# Patient Record
Sex: Female | Born: 1990 | Race: White | Hispanic: No | Marital: Single | Smoking: Never smoker
Health system: Southern US, Community
[De-identification: ages and names within clinical notes are randomized; demographics above are authoritative.]

## PROBLEM LIST (undated history)

## (undated) DIAGNOSIS — K469 Unspecified abdominal hernia without obstruction or gangrene: Secondary | ICD-10-CM

## (undated) DIAGNOSIS — K589 Irritable bowel syndrome without diarrhea: Secondary | ICD-10-CM

## (undated) DIAGNOSIS — E109 Type 1 diabetes mellitus without complications: Secondary | ICD-10-CM

## (undated) DIAGNOSIS — K859 Acute pancreatitis without necrosis or infection, unspecified: Secondary | ICD-10-CM

## (undated) DIAGNOSIS — G43909 Migraine, unspecified, not intractable, without status migrainosus: Secondary | ICD-10-CM

## (undated) DIAGNOSIS — K219 Gastro-esophageal reflux disease without esophagitis: Secondary | ICD-10-CM

## (undated) DIAGNOSIS — E1110 Type 2 diabetes mellitus with ketoacidosis without coma: Secondary | ICD-10-CM

## (undated) DIAGNOSIS — R17 Unspecified jaundice: Secondary | ICD-10-CM

## (undated) HISTORY — DX: Unspecified jaundice: R17

## (undated) HISTORY — PX: HERNIA REPAIR: SHX51

## (undated) HISTORY — PX: PANCREATIC CYST DRAINAGE: SHX2156

## (undated) HISTORY — PX: BILE DUCT STENT PLACEMENT: SHX1227

---
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
## 2012-04-29 ENCOUNTER — Encounter (HOSPITAL_COMMUNITY): Payer: Self-pay | Admitting: *Deleted

## 2012-04-29 ENCOUNTER — Inpatient Hospital Stay (HOSPITAL_COMMUNITY)
Admission: EM | Admit: 2012-04-29 | Discharge: 2012-05-02 | DRG: 204 | Disposition: A | Discharge status: Home / Self Care | Payer: BC Managed Care – PPO | Attending: Internal Medicine | Admitting: Internal Medicine

## 2012-04-29 DIAGNOSIS — R000 Tachycardia, unspecified: Secondary | ICD-10-CM

## 2012-04-29 DIAGNOSIS — D75839 Thrombocytosis, unspecified: Secondary | ICD-10-CM

## 2012-04-29 DIAGNOSIS — E871 Hypo-osmolality and hyponatremia: Secondary | ICD-10-CM

## 2012-04-29 DIAGNOSIS — IMO0002 Reserved for concepts with insufficient information to code with codable children: Secondary | ICD-10-CM | POA: Diagnosis present

## 2012-04-29 DIAGNOSIS — Z91199 Patient's noncompliance with other medical treatment and regimen due to unspecified reason: Secondary | ICD-10-CM

## 2012-04-29 DIAGNOSIS — K859 Acute pancreatitis without necrosis or infection, unspecified: Principal | ICD-10-CM

## 2012-04-29 DIAGNOSIS — K219 Gastro-esophageal reflux disease without esophagitis: Secondary | ICD-10-CM

## 2012-04-29 DIAGNOSIS — E109 Type 1 diabetes mellitus without complications: Secondary | ICD-10-CM

## 2012-04-29 DIAGNOSIS — R112 Nausea with vomiting, unspecified: Secondary | ICD-10-CM

## 2012-04-29 DIAGNOSIS — E1110 Type 2 diabetes mellitus with ketoacidosis without coma: Secondary | ICD-10-CM

## 2012-04-29 DIAGNOSIS — E860 Dehydration: Secondary | ICD-10-CM

## 2012-04-29 DIAGNOSIS — Z882 Allergy status to sulfonamides status: Secondary | ICD-10-CM

## 2012-04-29 DIAGNOSIS — E1010 Type 1 diabetes mellitus with ketoacidosis without coma: Secondary | ICD-10-CM | POA: Diagnosis present

## 2012-04-29 DIAGNOSIS — E11649 Type 2 diabetes mellitus with hypoglycemia without coma: Secondary | ICD-10-CM

## 2012-04-29 DIAGNOSIS — D473 Essential (hemorrhagic) thrombocythemia: Secondary | ICD-10-CM

## 2012-04-29 DIAGNOSIS — Z79899 Other long term (current) drug therapy: Secondary | ICD-10-CM

## 2012-04-29 DIAGNOSIS — E876 Hypokalemia: Secondary | ICD-10-CM

## 2012-04-29 DIAGNOSIS — D649 Anemia, unspecified: Secondary | ICD-10-CM

## 2012-04-29 DIAGNOSIS — Z23 Encounter for immunization: Secondary | ICD-10-CM

## 2012-04-29 DIAGNOSIS — Z9119 Patient's noncompliance with other medical treatment and regimen: Secondary | ICD-10-CM

## 2012-04-29 DIAGNOSIS — R1110 Vomiting, unspecified: Secondary | ICD-10-CM

## 2012-04-29 DIAGNOSIS — E1069 Type 1 diabetes mellitus with other specified complication: Secondary | ICD-10-CM | POA: Diagnosis present

## 2012-04-29 DIAGNOSIS — K861 Other chronic pancreatitis: Secondary | ICD-10-CM | POA: Diagnosis present

## 2012-04-29 DIAGNOSIS — Z794 Long term (current) use of insulin: Secondary | ICD-10-CM

## 2012-04-29 DIAGNOSIS — R109 Unspecified abdominal pain: Secondary | ICD-10-CM

## 2012-04-29 HISTORY — DX: Gastro-esophageal reflux disease without esophagitis: K21.9

## 2012-04-29 LAB — BASIC METABOLIC PANEL
BUN: 34 mg/dL — ABNORMAL HIGH (ref 6–23)
Chloride: 109 mEq/L (ref 96–112)
Creatinine, Ser: 0.62 mg/dL (ref 0.50–1.10)
GFR calc Af Amer: >90 mL/min (ref >90)
GFR calc non Af Amer: >90 mL/min (ref >90)

## 2012-04-29 LAB — CBC WITH DIFFERENTIAL/PLATELET
Basophils Relative: 0 % (ref 0–1)
HCT: 43.5 % (ref 36.0–46.0)
Hemoglobin: 15.2 g/dL — ABNORMAL HIGH (ref 12.0–15.0)
Lymphs Abs: 1.2 10*3/uL (ref 0.7–4.0)
MCHC: 34.9 g/dL (ref 30.0–36.0)
Monocytes Absolute: 1.4 10*3/uL — ABNORMAL HIGH (ref 0.1–1.0)
Monocytes Relative: 9 % (ref 3–12)
Neutro Abs: 12.4 10*3/uL — ABNORMAL HIGH (ref 1.7–7.7)
Neutrophils Relative %: 82 % — ABNORMAL HIGH (ref 43–77)
RBC: 5.14 MIL/uL — ABNORMAL HIGH (ref 3.87–5.11)

## 2012-04-29 LAB — COMPREHENSIVE METABOLIC PANEL
Albumin: 4.6 g/dL (ref 3.5–5.2)
Alkaline Phosphatase: 122 U/L — ABNORMAL HIGH (ref 39–117)
BUN: 51 mg/dL — ABNORMAL HIGH (ref 6–23)
CO2: 18 mEq/L — ABNORMAL LOW (ref 19–32)
Chloride: 101 mEq/L (ref 96–112)
Creatinine, Ser: 0.99 mg/dL (ref 0.50–1.10)
GFR calc Af Amer: >90 mL/min (ref >90)
GFR calc non Af Amer: 82 mL/min — ABNORMAL LOW (ref >90)
Glucose, Bld: 254 mg/dL — ABNORMAL HIGH (ref 70–99)
Potassium: 4 mEq/L (ref 3.5–5.1)
Total Bilirubin: 0.2 mg/dL — ABNORMAL LOW (ref 0.3–1.2)

## 2012-04-29 LAB — URINALYSIS, ROUTINE W REFLEX MICROSCOPIC
Glucose, UA: >1000 mg/dL — AB
Ketones, ur: 40 mg/dL — AB
Leukocytes, UA: NEGATIVE
Nitrite: NEGATIVE
Protein, ur: 30 mg/dL — AB
Urobilinogen, UA: 0.2 mg/dL (ref 0.0–1.0)

## 2012-04-29 LAB — URINE MICROSCOPIC-ADD ON

## 2012-04-29 LAB — GLUCOSE, CAPILLARY: Glucose-Capillary: 278 mg/dL — ABNORMAL HIGH (ref 70–99)

## 2012-04-29 LAB — PREGNANCY, URINE: Preg Test, Ur: NEGATIVE

## 2012-04-29 MED ORDER — SODIUM CHLORIDE 0.9 % IV BOLUS (SEPSIS)
1000.0000 mL | Freq: Once | INTRAVENOUS | Status: AC
  Administered 2012-04-29: 1000 mL via INTRAVENOUS

## 2012-04-29 MED ORDER — HYDROMORPHONE HCL PF 1 MG/ML IJ SOLN
0.5000 mg | Freq: Once | INTRAMUSCULAR | Status: AC
  Administered 2012-04-29: 1 mg via INTRAVENOUS
  Filled 2012-04-29: qty 1

## 2012-04-29 MED ORDER — SODIUM CHLORIDE 0.9 % IV SOLN: INTRAVENOUS | Status: DC

## 2012-04-29 MED ORDER — PANTOPRAZOLE SODIUM 40 MG IV SOLR
40.0000 mg | INTRAVENOUS | Status: DC
  Administered 2012-04-30 – 2012-05-01 (×3): 40 mg via INTRAVENOUS
  Filled 2012-04-29 (×2): qty 40

## 2012-04-29 MED ORDER — ONDANSETRON HCL 4 MG/2ML IJ SOLN
4.0000 mg | Freq: Once | INTRAMUSCULAR | Status: AC
  Administered 2012-04-29: 4 mg via INTRAVENOUS
  Filled 2012-04-29: qty 2

## 2012-04-29 MED ORDER — HYDROMORPHONE HCL PF 1 MG/ML IJ SOLN
0.5000 mg | INTRAMUSCULAR | Status: DC | PRN
  Administered 2012-04-29 – 2012-04-30 (×6): 1 mg via INTRAVENOUS
  Filled 2012-04-29 (×6): qty 1

## 2012-04-29 MED ORDER — DEXTROSE-NACL 5-0.45 % IV SOLN
INTRAVENOUS | Status: DC
  Administered 2012-04-30: 150 mL via INTRAVENOUS
  Administered 2012-04-30 (×2): via INTRAVENOUS

## 2012-04-29 MED ORDER — POTASSIUM CHLORIDE 10 MEQ/100ML IV SOLN: 10.0000 meq | INTRAVENOUS | Status: AC

## 2012-04-29 MED ORDER — ENOXAPARIN SODIUM 40 MG/0.4ML ~~LOC~~ SOLN
40.0000 mg | SUBCUTANEOUS | Status: DC
  Administered 2012-04-30 – 2012-05-02 (×4): 40 mg via SUBCUTANEOUS
  Filled 2012-04-29 (×3): qty 0.4

## 2012-04-29 MED ORDER — DEXTROSE 50 % IV SOLN: 25.0000 mL | INTRAVENOUS | Status: DC | PRN

## 2012-04-29 MED ORDER — SODIUM CHLORIDE 0.9 % IV SOLN
INTRAVENOUS | Status: DC
  Administered 2012-04-30: 0.7 [IU]/h via INTRAVENOUS
  Filled 2012-04-29: qty 1

## 2012-04-29 NOTE — ED Provider Notes (Signed)
History   This chart was scribed for Linda Lennert, MD, by Frederik Pear. The patient was seen in room APA04/[facility]04 and the patient's care was started at 1735.    CSN: [id number]  Arrival date & time 04/29/12  1723   First MD Initiated Contact with Patient 04/29/12 1735      Chief Complaint  Patient presents with  . Emesis    (Consider location/radiation/quality/duration/timing/severity/associated sxs/prior treatment) HPI Comments: Linda Walls is a 21 y.o. female with a h/o of DM who presents to the Emergency Department complaining of moderate, constant abdominal pain with associated vomiting that began 3 days ago. She has a h/o of hereditary pancreatitis.     PCP is Dr. Gerda Diss.  Patient is a 21 y.o. female presenting with vomiting. The history is provided by a parent.  Emesis  This is a new problem. The current episode started more than 2 days ago. The problem has been gradually worsening. The emesis has an appearance of bilious material. There has been no fever. Associated symptoms include abdominal pain. Pertinent negatives include no cough, no diarrhea and no headaches. Risk factors: DM.    Past Medical History  Diagnosis Date  . Hereditary pancreatitis   . DM type 1 (diabetes mellitus, type 1)   . Hernia     Past Surgical History  Procedure Date  . Hernia repair     As infant.    No family history on file.  History  Substance Use Topics  . Smoking status: Never Smoker   . Smokeless tobacco: Never Used  . Alcohol Use: No    OB History    Grav Para Term Preterm Abortions TAB SAB Ect Mult Living                  Review of Systems  Constitutional: Negative for fatigue.  HENT: Negative for congestion, sinus pressure and ear discharge.   Eyes: Negative for discharge.  Respiratory: Negative for cough.   Cardiovascular: Negative for chest pain.  Gastrointestinal: Positive for vomiting and abdominal pain. Negative for diarrhea.  Genitourinary:  Negative for frequency and hematuria.  Musculoskeletal: Negative for back pain.  Skin: Negative for rash.  Neurological: Negative for seizures and headaches.  Hematological: Negative.   Psychiatric/Behavioral: Negative for hallucinations.    Allergies  Shellfish allergy and Sulfa antibiotics  Home Medications   Current Outpatient Rx  Name Route Sig Dispense Refill  . IBUPROFEN 200 MG PO TABS Oral Take 400 mg by mouth every 6 (six) hours as needed. pain    . INSULIN ASPART 100 UNIT/ML [place] SOLN Subcutaneous Inject 12 Units into the skin 3 (three) times daily before meals. *Increase subcutaneous injection by 1 unit for every 50 units over 150.*    . INSULIN GLARGINE 100 UNIT/ML [place] SOLN Subcutaneous Inject 45 Units into the skin every evening.     Carma Leaven M PLUS PO TABS Oral Take 1 tablet by mouth daily. For energy    . OMEPRAZOLE 20 MG PO CPDR Oral Take 20 mg by mouth daily as needed. For acid reflux    . OXYCODONE-ACETAMINOPHEN 7.5-325 MG PO TABS Oral Take 1 tablet by mouth every 6 (six) hours as needed. For pain    . PROMETHAZINE HCL 25 MG RE SUPP Rectal Place 1 suppository (25 mg total) rectally every 6 (six) hours as needed for nausea. 12 each 0  . PROMETHAZINE HCL 25 MG PO TABS Oral Take 25 mg by mouth daily as needed. Nausea,vomiting  BP 131/83  Pulse 118  Temp 97.6 F (36.4 C) (Oral)  Resp 16  Ht 5\' 2"  (1.575 m)  Wt 112 lb (50.803 kg)  BMI 20.49 kg/m2  SpO2 100%  LMP 02/27/2012  Physical Exam  Constitutional: She is oriented to person, place, and time. She appears well-developed. She appears lethargic.  HENT:  Head: Normocephalic and atraumatic.       Mucus membranes are dry.  Eyes: Conjunctivae normal and EOM are normal. No scleral icterus.  Neck: Neck supple. No thyromegaly present.  Cardiovascular: Normal rate and regular rhythm.  Exam reveals no gallop and no friction rub.   No murmur heard. Pulmonary/Chest: No stridor. She has no wheezes. She has no rales. She  exhibits no tenderness.  Abdominal: She exhibits no distension. There is tenderness. There is no rebound.       Mild tenderness throughout.  Musculoskeletal: Normal range of motion. She exhibits no edema.  Lymphadenopathy:    She has no cervical adenopathy.  Neurological: She is oriented to person, place, and time. She appears lethargic. Coordination normal.  Skin: No rash noted. No erythema.  Psychiatric: She has a normal mood and affect. Her behavior is normal.    ED Course  Procedures (including critical care time)  DIAGNOSTIC STUDIES: Oxygen Saturation is 98% on room air, normal by my interpretation.    COORDINATION OF CARE:  17:40- Discussed planned course of treatment with the patient, including a UA, who is agreeable at this time.  17:53- Medication Orders- sodium chloride 0.9% bolus 1,000 mL- Once, sodium chloride 0.9% bolus 1,000 ml- Once, ondansetron (ZOFRAN) injection 4 mg- Once  18:26- Medication Orders- sodium chloride 0.9% bolus 1,000 ml  18:45 Medication Orders- ondansetron (ZOFRAN) injection 4 mg, HYDROmorphone (DILAUDID) injection 0.5 mg  19:17- Medication Orders- sodium chloride 0.9% bolus 1,000 ml  20:01- Recheck- Discussed keeping the patient for overnight observation.  Labs Reviewed  CBC WITH DIFFERENTIAL - Abnormal; Notable for the following:    WBC 15.0 (*)     RBC 5.14 (*)     Hemoglobin 15.2 (*)     Platelets 414 (*)     Neutrophils Relative 82 (*)     Neutro Abs 12.4 (*)     Lymphocytes Relative 8 (*)     Monocytes Absolute 1.4 (*)     All other components within normal limits  COMPREHENSIVE METABOLIC PANEL - Abnormal; Notable for the following:    CO2 18 (*)     Glucose, Bld 254 (*)     BUN 51 (*)     Total Protein 8.9 (*)     Alkaline Phosphatase 122 (*)     Total Bilirubin 0.2 (*)     GFR calc non Af Amer 82 (*)     All other components within normal limits  URINALYSIS, ROUTINE W REFLEX MICROSCOPIC - Abnormal; Notable for the following:     Glucose, UA >1000 (*)     Hgb urine dipstick SMALL (*)     Bilirubin Urine MODERATE (*)     Ketones, ur 40 (*)     Protein, ur 30 (*)     All other components within normal limits  GLUCOSE, CAPILLARY - Abnormal; Notable for the following:    Glucose-Capillary 278 (*)     All other components within normal limits  PREGNANCY, URINE  URINE MICROSCOPIC-ADD ON  URINE CULTURE  BASIC METABOLIC PANEL   No results found.   No diagnosis found.  CRITICAL CARE Performed by: Giuseppe Duchemin L  Total critical care time: 40  Critical care time was exclusive of separately billable procedures and treating other patients.  Critical care was necessary to treat or prevent imminent or life-threatening deterioration.  Critical care was time spent personally by me on the following activities: development of treatment plan with patient and/or surrogate as well as nursing, discussions with consultants, evaluation of patient's response to treatment, examination of patient, obtaining history from patient or surrogate, ordering and performing treatments and interventions, ordering and review of laboratory studies, ordering and review of radiographic studies, pulse oximetry and re-evaluation of patient's condition.   MDM    The chart was scribed for me under my direct supervision.  I personally performed the history, physical, and medical decision making and all procedures in the evaluation of this patient.Linda Lennert, MD 04/29/12 2022

## 2012-04-29 NOTE — ED Notes (Signed)
Labs drawn, family at bedside, foley draining clear urine

## 2012-04-29 NOTE — ED Notes (Signed)
Pt has been vomiting x 2 days with abdominal pain. Denies diarrhea. Pt is a diabetic.

## 2012-04-29 NOTE — ED Notes (Signed)
Pt cbg was 135

## 2012-04-29 NOTE — H&P (Signed)
[facility] History and Physical  [name]  ZOX:[id number]  DOB: 1991-02-02   DOA: 04/29/2012   PCP:   [name], MD   Chief Complaint:  Vomiting and abdominal pain for 2 days  HPI: Linda Walls is an 21 y.o. female.   Young Caucasian lady with a history of diabetes type 1, gerd, and hereditary pancreatitis, presents with 2 days of nausea and vomiting more so yesterday, and today severe abdominal pain. Since yesterday she is been eating and drinking very little and has been looking very dehydrated, her blood sugars of been reading as "high"and her parent's were very attentive decided they better bring her to the emergency room today.  In the emergency room although her anion gap is normal, her BUN is markedly elevated, she looks extremely sick and puny, and after 3 L of IV fluids the hospitalist service was called to assist with management. Currently patient is complaining of severe abdominal pain, and father he is focused on comforting her.  Rewiew of Systems:   All systems negative except as marked bold or noted in the HPI;  Constitutional: Negative for malaise, fever and chills. ;  Eyes: Negative for eye pain, redness and discharge. ;  ENMT: Negative for ear pain, hoarseness, nasal congestion, sinus pressure and sore throat. ;  Cardiovascular: Negative for chest pain, palpitations, diaphoresis, dyspnea and peripheral edema. ;  Respiratory: Negative for cough, hemoptysis, wheezing and stridor. ;  Gastrointestinal: Negative for  diarrhea, constipation,  melena, blood in stool, hematemesis, jaundice and rectal bleeding. unusual weight loss..   Genitourinary: Negative for frequency, dysuria, incontinence,flank pain and hematuria; Musculoskeletal: Negative for back pain and neck pain. Negative for swelling and trauma.;  Skin: . Negative for pruritus, rash, abrasions, bruising and skin lesion.; ulcerations Neuro: Negative for headache, lightheadedness and neck stiffness.  Negative for weakness, altered level of consciousness , altered mental status, extremity weakness, burning feet, involuntary movement, seizure and syncope.  Psych: negative for anxiety, depression, insomnia, tearfulness, panic attacks, hallucinations, paranoia, suicidal or homicidal ideation   Past Medical History  Diagnosis Date  . Hereditary pancreatitis   . DM type 1 (diabetes mellitus, type 1)   . Hernia   . GERD (gastroesophageal reflux disease)     Past Surgical History  Procedure Date  . Hernia repair     As infant.    Medications:  HOME MEDS: Prior to Admission medications   Medication Sig Start Date End Date Taking? Authorizing Provider  ibuprofen (ADVIL,MOTRIN) 200 MG tablet Take 400 mg by mouth every 6 (six) hours as needed. pain   Yes Historical Provider, MD  insulin aspart (NOVOLOG FLEXPEN) 100 UNIT/ML injection Inject 12 Units into the skin 3 (three) times daily before meals. *Increase subcutaneous injection by 1 unit for every 50 units over 150.*   Yes Historical Provider, MD  insulin glargine (LANTUS) 100 UNIT/ML injection Inject 45 Units into the skin every evening.    Yes Historical Provider, MD  Multiple Vitamins-Minerals (MULTIVITAMINS THER. W/MINERALS) TABS Take 1 tablet by mouth daily. For energy   Yes Historical Provider, MD  omeprazole (PRILOSEC) 20 MG capsule Take 20 mg by mouth daily as needed. For acid reflux   Yes Historical Provider, MD  oxyCODONE-acetaminophen (PERCOCET) 7.5-325 MG per tablet Take 1 tablet by mouth every 6 (six) hours as needed. For pain   Yes Historical Provider, MD  promethazine (PHENERGAN) 25 MG suppository Place 1 suppository (25 mg total) rectally every 6 (six) hours as needed for nausea.  05/20/11 05/29/12 Yes Elliot Cousin, MD  promethazine (PHENERGAN) 25 MG tablet Take 25 mg by mouth daily as needed. Nausea,vomiting    Historical Provider, MD     Allergies:  Allergies  Allergen Reactions  . Shellfish Allergy Nausea And  Vomiting  . Sulfa Antibiotics Rash    Social History:   reports that she has never smoked. She has never used smokeless tobacco. She reports that she does not drink alcohol or use illicit drugs.  Family History: Family History  Problem Relation Age of Onset  . Pancreatitis Father 30    hereditary  . Hypertension Father   . GER disease Father      Physical Exam: Filed Vitals:   04/29/12 1727 04/29/12 1833  BP: 106/70 131/83  Pulse: 150 118  Temp: 97.6 F (36.4 C)   TempSrc: Oral   Resp: 16   Height: 5\' 2"  (1.575 m)   Weight: 50.803 kg (112 lb)   SpO2: 98% 100%   Blood pressure 131/83, pulse 118, temperature 97.6 F (36.4 C), temperature source Oral, resp. rate 16, height 5\' 2"  (1.575 m), weight 50.803 kg (112 lb), last menstrual period 02/27/2012, SpO2 100.00%.  GEN:  Pleasant but ill-looking young Caucasian lady lying in the stretcher in painful distress; cooperative with exam PSYCH:  alert and oriented x4; anxious; affect is appropriate. HEENT: Mucous membranes pink, dry, and anicteric; PERRLA; EOM intact; no cervical lymphadenopathy nor thyromegaly or carotid bruit; no JVD; Breasts:: Not examined CHEST WALL: No tenderness CHEST: Normal respiration, clear to auscultation bilaterally HEART: Tachycardic, regular rhythm; no murmurs rubs or gallops BACK: No kyphosis or scoliosis; no CVA tenderness ABDOMEN: Scaphoid, soft, diffuse mild tenderness ; no masses, no organomegaly, normal abdominal bowel sounds; no pannus; no intertriginous candida. Rectal Exam: Not done EXTREMITIES: No bone or joint deformity; ; no edema; no ulcerations. Genitalia: not examined PULSES: 2+ and symmetric SKIN: Normal hydration no rash or ulceration CNS: Cranial nerves 2-12 grossly intact no focal lateralizing neurologic deficit   Labs on Admission:  Basic Metabolic Panel:  Lab 04/29/12 1610  NA 135  K 4.0  CL 101  CO2 18*  GLUCOSE 254*  BUN 51*  CREATININE 0.99  CALCIUM 10.4  MG --    PHOS --   Liver Function Tests:  Lab 04/29/12 1823  AST 12  ALT 15  ALKPHOS 122*  BILITOT 0.2*  PROT 8.9*  ALBUMIN 4.6    Lab 04/29/12 2000  LIPASE 14  AMYLASE --   No results found for this basename: AMMONIA:5 in the last 168 hours CBC:  Lab 04/29/12 1823  WBC 15.0*  NEUTROABS 12.4*  HGB 15.2*  HCT 43.5  MCV 84.6  PLT 414*   Cardiac Enzymes: No results found for this basename: CKTOTAL:5,CKMB:5,CKMBINDEX:5,TROPONINI:5 in the last 168 hours BNP: No components found with this basename: POCBNP:5 D-dimer: No components found with this basename: D-DIMER:5 CBG:  Lab 04/29/12 2124 04/29/12 1804  GLUCAP 135* 278*    Radiological Exams on Admission: No results found.     Assessment/Plan Present on Admission:  . possible DKA (diabetic ketoacidoses) . severe Dehydration .Abdominal  pain, other specified site .GERD (gastroesophageal reflux disease)  PLAN: A sicker she looks we'll attempt to get an ABG to confirm acidosis; she does have ketones in her urine In the meantime we'll continue hydration and place on insulin drip; keep her n.p.o.  Counsel against NSAIDs and the presence of dehydration or with GERD. PPIs in case gastritis is the main source of her  pain  Since there is no clear cause for sudden deterioration in her records control, and by reports her hemoglobin A1c remains elevated, will consult her endocrinologist for assistance.  Other plans as per orders.  Code Status:FULL CODE  Family Communication: Mother and father at bedside for interview and examination Disposition Plan: Discharge home tomorrow if he becomes stable and pain controlled, and endocrinologist  adjusts her insulin  Javarri Segal Nocturnist [facility] Pager [phone]   04/29/2012, 10:02 PM

## 2012-04-30 ENCOUNTER — Encounter (HOSPITAL_COMMUNITY): Payer: Self-pay | Admitting: *Deleted

## 2012-04-30 DIAGNOSIS — R1110 Vomiting, unspecified: Secondary | ICD-10-CM

## 2012-04-30 LAB — BASIC METABOLIC PANEL
BUN: 21 mg/dL (ref 6–23)
CO2: 16 mEq/L — ABNORMAL LOW (ref 19–32)
CO2: 18 mEq/L — ABNORMAL LOW (ref 19–32)
Chloride: 108 mEq/L (ref 96–112)
Chloride: 110 mEq/L (ref 96–112)
Chloride: 111 mEq/L (ref 96–112)
GFR calc Af Amer: >90 mL/min (ref >90)
GFR calc non Af Amer: >90 mL/min (ref >90)
Glucose, Bld: 126 mg/dL — ABNORMAL HIGH (ref 70–99)
Glucose, Bld: 208 mg/dL — ABNORMAL HIGH (ref 70–99)
Potassium: 3.7 mEq/L (ref 3.5–5.1)
Potassium: 3.9 mEq/L (ref 3.5–5.1)
Potassium: 3.9 mEq/L (ref 3.5–5.1)
Sodium: 137 mEq/L (ref 135–145)
Sodium: 139 mEq/L (ref 135–145)
Sodium: 139 mEq/L (ref 135–145)

## 2012-04-30 LAB — GLUCOSE, CAPILLARY
Glucose-Capillary: 127 mg/dL — ABNORMAL HIGH (ref 70–99)
Glucose-Capillary: 133 mg/dL — ABNORMAL HIGH (ref 70–99)
Glucose-Capillary: 142 mg/dL — ABNORMAL HIGH (ref 70–99)
Glucose-Capillary: 156 mg/dL — ABNORMAL HIGH (ref 70–99)
Glucose-Capillary: 171 mg/dL — ABNORMAL HIGH (ref 70–99)
Glucose-Capillary: 173 mg/dL — ABNORMAL HIGH (ref 70–99)
Glucose-Capillary: 185 mg/dL — ABNORMAL HIGH (ref 70–99)
Glucose-Capillary: 185 mg/dL — ABNORMAL HIGH (ref 70–99)
Glucose-Capillary: 198 mg/dL — ABNORMAL HIGH (ref 70–99)
Glucose-Capillary: 260 mg/dL — ABNORMAL HIGH (ref 70–99)

## 2012-04-30 LAB — CBC
HCT: 34.3 % — ABNORMAL LOW (ref 36.0–46.0)
Hemoglobin: 11.9 g/dL — ABNORMAL LOW (ref 12.0–15.0)
MCH: 29.5 pg (ref 26.0–34.0)
MCHC: 34.7 g/dL (ref 30.0–36.0)
MCV: 84.9 fL (ref 78.0–100.0)
RDW: 12.9 % (ref 11.5–15.5)

## 2012-04-30 LAB — HEMOGLOBIN A1C: Mean Plasma Glucose: 315 mg/dL — ABNORMAL HIGH (ref <117)

## 2012-04-30 MED ORDER — INSULIN REGULAR HUMAN 100 UNIT/ML IJ SOLN
INTRAMUSCULAR | Status: AC
  Filled 2012-04-30: qty 3

## 2012-04-30 MED ORDER — POTASSIUM CHLORIDE 10 MEQ/100ML IV SOLN
INTRAVENOUS | Status: AC
  Filled 2012-04-30: qty 200

## 2012-04-30 MED ORDER — INFLUENZA VIRUS VACC SPLIT PF IM SUSP
0.5000 mL | INTRAMUSCULAR | Status: AC
  Administered 2012-05-01: 0.5 mL via INTRAMUSCULAR
  Filled 2012-04-30: qty 0.5

## 2012-04-30 MED ORDER — POTASSIUM CHLORIDE 10 MEQ/100ML IV SOLN
10.0000 meq | INTRAVENOUS | Status: AC
  Administered 2012-04-30 (×2): 10 meq via INTRAVENOUS

## 2012-04-30 MED ORDER — ONDANSETRON HCL 4 MG/2ML IJ SOLN
4.0000 mg | INTRAMUSCULAR | Status: DC | PRN
  Administered 2012-04-30 – 2012-05-02 (×8): 4 mg via INTRAVENOUS
  Filled 2012-04-30 (×6): qty 2

## 2012-04-30 MED ORDER — BACITRACIN 500 UNIT/GM EX OINT
TOPICAL_OINTMENT | Freq: Every day | CUTANEOUS | Status: DC
  Administered 2012-05-01 (×2): 1 via TOPICAL
  Filled 2012-04-30: qty 15

## 2012-04-30 MED ORDER — OXYCODONE-ACETAMINOPHEN 5-325 MG PO TABS
2.0000 | ORAL_TABLET | ORAL | Status: DC | PRN
Start: 2012-04-30 — End: 2012-05-02
  Administered 2012-04-30 – 2012-05-01 (×4): 2 via ORAL
  Filled 2012-04-30 (×4): qty 2

## 2012-04-30 MED ORDER — SODIUM CHLORIDE 0.9 % IJ SOLN
INTRAMUSCULAR | Status: AC
  Filled 2012-04-30: qty 3

## 2012-04-30 NOTE — Progress Notes (Signed)
Nutrition Brief Note  Patient identified on the Malnutrition Screening Tool (MST) Report  Body mass index is 20.48 kg/(m^2). Pt meets criteria for Normal Range based on current BMI.   Current diet order is Clear Liquids , patient is feeling hungry diet just advanced. No po intake recorded as yet. Labs and medications reviewed. She and parents declined diet education review.  No nutrition interventions warranted at this time. If nutrition issues arise, please consult RD.   #[phone]

## 2012-04-30 NOTE — Progress Notes (Signed)
Notified Dr. Fransico Him of the insulin drip requiring me to turn off for the last two readings/hours.  Due to the fact that 0 could not be programmed into the alaris pump guardrails.  i voiced to him that her CBG's have continued to be checked Q1 and she has been in target range for the last 4 readings.  Voiced to him what those readings were.  Voiced to him that Dr. Kerry Hough had ordered clear liquids but she had not had anything po because she was waiting on him to re-evaluate.  He verbalized understanding and stated she could have clear liquids if she had positive bowel sounds.

## 2012-04-30 NOTE — Progress Notes (Signed)
UR Chart Review Completed  

## 2012-04-30 NOTE — Consult Note (Signed)
Linda Walls, Linda Walls             ACCOUNT NO.:  [id number]  MEDICAL RECORD NO.:  [id number]  LOCATION:  IC10                          FACILITY:  [facility]  PHYSICIAN:  Purcell Nails, MD DATE OF BIRTH:  1991-04-02  DATE OF CONSULTATION:  04/30/2012 DATE OF DISCHARGE:                                CONSULTATION   REASON FOR CONSULT:  Diabetic ketoacidosis.  HISTORY OF PRESENT ILLNESS:  This is a 21 year old female with medical history as follows.  She is familiar to me from outpatient consult before August, 2013.  She has a history of  hereditary pancreatitis which triggered type 1 diabetes at age 73.  She had uncontrolled course for several years.  She saw me first in June of 2013 with a hemoglobin A1c near 10%, at which time, she was put on basal bolus insulin using Lantus and NovoLog.  Her A1c has improved from 11.6-10% at January 30, 2012 test.  She comes into the hospital with a complaint of vomiting and abdominal pain for 2 days, and her meter rate blood glucose is high. She was found to have near DKA profile with a bicarb of 15 at presentation.  Hence she was subsequently admitted for stabilization. She is on insulin drip at this point, doing much better, but did not tolerate any oral feeding yet.  She still has abdominal pain;  however, no vomiting at this point.  She is on dextrose 5 in normal saline at 150 mL/h with good urine output.  PAST MEDICAL HISTORY:  Type 1 diabetes, hereditary pancreatitis.  PAST SURGICAL HISTORY:  Hernia repair and central line placement in the past.  HOME MEDICATIONS: 1. NovoLog 12 units t.i.d. before meals. 2. Lantus 45 units at bedtime.  REVIEW OF SYSTEMS:  The patient did not have any headaches or bleeding. She did not have any chest pain.  No cough.  No fever.  She has had abdominal pain, nausea, and vomiting, but no diarrhea.  She denied any muscle pain or back pain.  The rest of the systems have been reviewed and  negative.  ALLERGIES:  Shellfish and sulfa antibiotics.  SOCIAL HISTORY:  Negative for smoking, alcohol, or drug use.  FAMILY HISTORY:  Significant for pancreatitis in her father at age 83, hypertension, and GERD.  PHYSICAL EXAMINATION:  GENERAL:  She is sick looking, not in acute distress. VITAL SIGNS:  Blood pressure is 118/81, pulse rate 91, temperature 98.6. HEENT:  Slightly dry mucous membranes. NECK:  Negative for JVD or thyromegaly. CHEST:  Clear to auscultation bilaterally. CARDIOVASCULAR:  Normal S1, S2.  No murmur.  No gallop.  ABDOMEN: Tender with guarding on lower quadrant.  Bowel sounds are hypoactive at this point. EXTREMITIES:  No edema. CNS:  Nonfocal.  LABORATORY DATA:  Her most recent labs show sodium 137, potassium 3.9, chloride 108, bicarb 19, BUN 21, creatinine 0.53, calcium 9.0. Hemoglobin A1c 10.4% on January 30, 2012.  ASSESSMENT: 1. Diabetic ketoacidosis, resolving. 2. Type 1 diabetes chronically uncontrolled with recent improvement on     basal bolus insulin.  PLAN:  Until the patient tolerates oral feeding and until anion gap closes it is better to keep her on the drip for at least  until the end of the day.  She can start ice chips; if she tolerates clear liquids will be advanced up to 1800 ADA calorie diet,  at which time, we will consider switching her to basal bolus insulin.  Continue to care for the patient in ICU.  I will also continue to follow her.          ______________________________ Purcell Nails, MD     GN/MEDQ  D:  04/30/2012  T:  04/30/2012  Job:  [id number]

## 2012-04-30 NOTE — Consult Note (Signed)
  [id number] 

## 2012-04-30 NOTE — Progress Notes (Addendum)
Contacted MD to request IV pain medicine be changed to PO. Patient takes percocet at home for pancreatic pain. IV pain medication making patient very groggy.

## 2012-04-30 NOTE — Progress Notes (Signed)
[facility]             Progress Note   Subjective: Feeling better today.  Still has some mild abd pain, but nausea and vomiting are better.  She feels hungry. Currently still on insulin infusion.  Objective: Vital signs in last 24 hours: Temp:  [97.6 F (36.4 C)-99 F (37.2 C)] 98.6 F (37 C) (11/01 0800) Pulse Rate:  [90-150] 102  (11/01 0800) Resp:  [9-20] 17  (11/01 0800) BP: (106-131)/(59-94) 118/83 mmHg (11/01 0800) SpO2:  [96 %-100 %] 100 % (11/01 0800) Weight:  [49 kg (108 lb 0.4 oz)-50.803 kg (112 lb)] 50.8 kg (111 lb 15.9 oz) (11/01 0500) Weight change:  Last BM Date: 04/29/12  Intake/Output from previous day: 10/31 0701 - 11/01 0700 In: 1139.8 [I.V.:939.8; IV Piggyback:200] Out: 1000 [Urine:1000]     Physical Exam: General: Alert, awake, oriented x3, in no acute distress. HEENT: No bruits, no goiter. Heart: s1, s2, tachycardic Lungs: Clear to auscultation bilaterally. Abdomen: Soft, nontender, nondistended, positive bowel sounds. Extremities: No clubbing cyanosis or edema with positive pedal pulses. Neuro: Grossly intact, nonfocal.    Lab Results: Basic Metabolic Panel:  Basename 04/30/12 0824 04/30/12 0424  NA 137 139  K 3.9 3.7  CL 108 111  CO2 19 18*  GLUCOSE 208* 126*  BUN 21 26*  CREATININE 0.53 0.57  CALCIUM 9.0 9.1  MG -- --  PHOS -- --   Liver Function Tests:  [facility] 04/29/12 1823  AST 12  ALT 15  ALKPHOS 122*  BILITOT 0.2*  PROT 8.9*  ALBUMIN 4.6    Basename 04/29/12 2000  LIPASE 14  AMYLASE --   No results found for this basename: AMMONIA:2 in the last 72 hours CBC:  Basename 04/30/12 0424 04/29/12 1823  WBC 8.5 15.0*  NEUTROABS -- 12.4*  HGB 11.9* 15.2*  HCT 34.3* 43.5  MCV 84.9 84.6  PLT 295 414*   Cardiac Enzymes: No results found for this basename: CKTOTAL:3,CKMB:3,CKMBINDEX:3,TROPONINI:3 in the last 72 hours BNP: No results found for this basename: PROBNP:3 in the last 72 hours D-Dimer: No  results found for this basename: DDIMER:2 in the last 72 hours CBG:  Basename 04/30/12 0830 04/30/12 0721 04/30/12 0628 04/30/12 0540 04/30/12 0433 04/30/12 0322  GLUCAP 198* 156* 142* 120* 119* 173*   Hemoglobin A1C: No results found for this basename: HGBA1C in the last 72 hours Fasting Lipid Panel: No results found for this basename: CHOL,HDL,LDLCALC,TRIG,CHOLHDL,LDLDIRECT in the last 72 hours Thyroid Function Tests: No results found for this basename: TSH,T4TOTAL,FREET4,T3FREE,THYROIDAB in the last 72 hours Anemia Panel: No results found for this basename: VITAMINB12,FOLATE,FERRITIN,TIBC,IRON,RETICCTPCT in the last 72 hours Coagulation: No results found for this basename: LABPROT:2,INR:2 in the last 72 hours Urine Drug Screen: Drugs of Abuse  No results found for this basename: labopia, cocainscrnur, labbenz, amphetmu, thcu, labbarb    Alcohol Level: No results found for this basename: ETH:2 in the last 72 hours Urinalysis:  Basename 04/29/12 1820  COLORURINE YELLOW  LABSPEC 1.020  PHURINE 6.0  GLUCOSEU >1000*  HGBUR SMALL*  BILIRUBINUR MODERATE*  KETONESUR 40*  PROTEINUR 30*  UROBILINOGEN 0.2  NITRITE NEGATIVE  LEUKOCYTESUR NEGATIVE    Recent Results (from the past 240 hour(s))  MRSA PCR SCREENING     Status: Normal   Collection Time   04/30/12  1:18 AM      Component Value Range Status Comment   MRSA by PCR NEGATIVE  NEGATIVE Final     Studies/Results: No results found.  Medications: Scheduled Meds:   . enoxaparin  40 mg Subcutaneous Q24H  . HYDROmorphone  0.5 mg Intravenous Once  . influenza  inactive virus vaccine  0.5 mL Intramuscular Tomorrow-1000  . ondansetron  4 mg Intravenous Once  . ondansetron  4 mg Intravenous Once  . pantoprazole (PROTONIX) IV  40 mg Intravenous Q24H  . potassium chloride  10 mEq Intravenous Q1H  . potassium chloride  10 mEq Intravenous Q1 Hr x 2  . potassium chloride      . sodium chloride  1,000 mL Intravenous Once  .  sodium chloride  1,000 mL Intravenous Once  . sodium chloride  1,000 mL Intravenous Once   Continuous Infusions:   . sodium chloride    . dextrose 5 % and 0.45% NaCl 150 mL/hr at 04/30/12 0616  . insulin (NOVOLIN-R) infusion 2.8 Units/hr (04/30/12 0835)   PRN Meds:.dextrose, HYDROmorphone (DILAUDID) injection, ondansetron (ZOFRAN) IV  Assessment/Plan:  Principal Problem:  *DKA (diabetic ketoacidoses) Active Problems:  Dehydration  Abdominal  pain, other specified site  GERD (gastroesophageal reflux disease)  1. DKA, in type 1 DM. Patient reports compliance with medications/diet.  She may have had a viral gastroenteritis that precipitated her dehydration and DKA.  She has been aggressively hydrated with IV fluids.  She has been maintained on insulin infusion and her Anion gap has closed. Blood sugars are in goal range.  She has been seen by her endocrinologist, Dr. Fransico Him.  Recommendations were to continue the insulin infusion until this afternoon, when further adjustments in her insulin regimen can be made. Will start patient on liquids and advance to low carb diet as tolerated. Once patient is tolerating solid foods and blood sugars remain reasonably controlled, we will plan on discharge home.  This will likely occur in the next 24 hours.  Transfer to medsurg bed once she is off insulin infusion.  Time spent coordinating care:   LOS: 1 day   MEMON,JEHANZEB [facility] Pager: [phone] 04/30/2012, 9:25 AM

## 2012-04-30 NOTE — Plan of Care (Signed)
Problem: Consults Goal: Diabetic Ketoacidosis (DKA) Patient Education See Patient Education Modules for education specifics. Type I diabetic, hereditary pancreatitis Goal: Diabetes Guidelines if Diabetic/Glucose > 140 If diabetic or lab glucose is > 140 mg/dl - Initiate Diabetes/Hyperglycemia Guidelines & Document Interventions  Outcome: Progressing Glucostabilizer in progress  Problem: Phase I Progression Outcomes Goal: Monitor hydration status Outcome: Progressing 4 Liters given in the ED Goal: Acidosis resolving Outcome: Completed/Met Date Met:  04/30/12 Anion Gap 10 at 0424 Goal: K+ level approaching normal with therapy Outcome: Progressing 2 runs of potassium given Goal: Nausea/vomiting controlled with antiemetics Outcome: Progressing Treating with antiemetics Goal: Pain controlled with appropriate interventions Outcome: Progressing Abdominal pain is a chronic issue, patient stating pain level at an 8, can live with a 7 Goal: Initial discharge plan identified Outcome: Progressing Home with parents

## 2012-04-30 NOTE — ED Notes (Signed)
Spoke with Dr. Orvan Falconer who instructed to start pt on insulin gtt and admit to [facility].

## 2012-05-01 DIAGNOSIS — R112 Nausea with vomiting, unspecified: Secondary | ICD-10-CM | POA: Diagnosis present

## 2012-05-01 DIAGNOSIS — E11649 Type 2 diabetes mellitus with hypoglycemia without coma: Secondary | ICD-10-CM | POA: Diagnosis not present

## 2012-05-01 DIAGNOSIS — E876 Hypokalemia: Secondary | ICD-10-CM

## 2012-05-01 DIAGNOSIS — E1169 Type 2 diabetes mellitus with other specified complication: Secondary | ICD-10-CM

## 2012-05-01 LAB — URINE CULTURE

## 2012-05-01 LAB — BASIC METABOLIC PANEL
GFR calc Af Amer: >90 mL/min (ref >90)
GFR calc non Af Amer: >90 mL/min (ref >90)
Potassium: 2.6 mEq/L — CL (ref 3.5–5.1)
Sodium: 138 mEq/L (ref 135–145)

## 2012-05-01 LAB — GLUCOSE, CAPILLARY
Glucose-Capillary: 117 mg/dL — ABNORMAL HIGH (ref 70–99)
Glucose-Capillary: 130 mg/dL — ABNORMAL HIGH (ref 70–99)
Glucose-Capillary: 180 mg/dL — ABNORMAL HIGH (ref 70–99)
Glucose-Capillary: 191 mg/dL — ABNORMAL HIGH (ref 70–99)
Glucose-Capillary: 192 mg/dL — ABNORMAL HIGH (ref 70–99)
Glucose-Capillary: 48 mg/dL — ABNORMAL LOW (ref 70–99)
Glucose-Capillary: 58 mg/dL — ABNORMAL LOW (ref 70–99)

## 2012-05-01 MED ORDER — KCL IN DEXTROSE-NACL 40-5-0.9 MEQ/L-%-% IV SOLN
INTRAVENOUS | Status: AC
  Filled 2012-05-01: qty 1000

## 2012-05-01 MED ORDER — POTASSIUM CHLORIDE IN NACL 40-0.9 MEQ/L-% IV SOLN: INTRAVENOUS | Status: DC

## 2012-05-01 MED ORDER — INSULIN GLARGINE 100 UNIT/ML ~~LOC~~ SOLN: 10.0000 [IU] | Freq: Every day | SUBCUTANEOUS | Status: DC

## 2012-05-01 MED ORDER — SODIUM CHLORIDE 0.9 % IJ SOLN
INTRAMUSCULAR | Status: AC
  Filled 2012-05-01: qty 3

## 2012-05-01 MED ORDER — INSULIN ASPART 100 UNIT/ML ~~LOC~~ SOLN
0.0000 [IU] | Freq: Three times a day (TID) | SUBCUTANEOUS | Status: DC
  Administered 2012-05-02: 2 [IU] via SUBCUTANEOUS

## 2012-05-01 MED ORDER — ONDANSETRON HCL 4 MG/2ML IJ SOLN
INTRAMUSCULAR | Status: AC
  Administered 2012-05-01: 4 mg via INTRAVENOUS
  Filled 2012-05-01: qty 2

## 2012-05-01 MED ORDER — BENZONATATE 100 MG PO CAPS
100.0000 mg | ORAL_CAPSULE | Freq: Three times a day (TID) | ORAL | Status: DC | PRN
  Administered 2012-05-01: 100 mg via ORAL
  Filled 2012-05-01: qty 1

## 2012-05-01 MED ORDER — INSULIN ASPART 100 UNIT/ML ~~LOC~~ SOLN
5.0000 [IU] | Freq: Three times a day (TID) | SUBCUTANEOUS | Status: DC
  Administered 2012-05-01: 5 [IU] via SUBCUTANEOUS

## 2012-05-01 MED ORDER — PANTOPRAZOLE SODIUM 40 MG IV SOLR
INTRAVENOUS | Status: AC
  Administered 2012-05-01: 21:00:00
  Filled 2012-05-01: qty 40

## 2012-05-01 MED ORDER — INSULIN ASPART 100 UNIT/ML ~~LOC~~ SOLN: 0.0000 [IU] | Freq: Every day | SUBCUTANEOUS | Status: DC

## 2012-05-01 MED ORDER — INSULIN ASPART 100 UNIT/ML ~~LOC~~ SOLN
0.0000 [IU] | Freq: Three times a day (TID) | SUBCUTANEOUS | Status: DC
  Administered 2012-05-01: 3 [IU] via SUBCUTANEOUS
  Administered 2012-05-01: 4 [IU] via SUBCUTANEOUS

## 2012-05-01 MED ORDER — KCL IN DEXTROSE-NACL 40-5-0.9 MEQ/L-%-% IV SOLN
INTRAVENOUS | Status: DC
  Administered 2012-05-01: 18:00:00 via INTRAVENOUS
  Filled 2012-05-01 (×4): qty 1000

## 2012-05-01 MED ORDER — HYDROMORPHONE HCL PF 1 MG/ML IJ SOLN
INTRAMUSCULAR | Status: AC
  Filled 2012-05-01: qty 1

## 2012-05-01 MED ORDER — SODIUM CHLORIDE 0.9 % IJ SOLN
INTRAMUSCULAR | Status: AC
  Administered 2012-05-01: 3 mL
  Filled 2012-05-01: qty 3

## 2012-05-01 MED ORDER — POTASSIUM CHLORIDE CRYS ER 20 MEQ PO TBCR
40.0000 meq | EXTENDED_RELEASE_TABLET | Freq: Once | ORAL | Status: AC
  Administered 2012-05-01: 40 meq via ORAL
  Filled 2012-05-01: qty 2

## 2012-05-01 MED ORDER — POTASSIUM CHLORIDE 10 MEQ/100ML IV SOLN
10.0000 meq | INTRAVENOUS | Status: DC
  Filled 2012-05-01: qty 400
  Filled 2012-05-01: qty 100

## 2012-05-01 MED ORDER — HYDROMORPHONE HCL PF 1 MG/ML IJ SOLN
1.0000 mg | INTRAMUSCULAR | Status: DC | PRN
  Administered 2012-05-01 – 2012-05-02 (×4): 1 mg via INTRAVENOUS
  Filled 2012-05-01 (×3): qty 1

## 2012-05-01 MED ORDER — POTASSIUM CHLORIDE CRYS ER 20 MEQ PO TBCR
EXTENDED_RELEASE_TABLET | ORAL | Status: AC
  Filled 2012-05-01: qty 1

## 2012-05-01 MED ORDER — POTASSIUM CHLORIDE CRYS ER 20 MEQ PO TBCR
EXTENDED_RELEASE_TABLET | ORAL | Status: AC
  Administered 2012-05-01: 17:00:00
  Filled 2012-05-01: qty 1

## 2012-05-01 MED ORDER — GUAIFENESIN-DM 100-10 MG/5ML PO SYRP: 5.0000 mL | ORAL_SOLUTION | ORAL | Status: DC | PRN

## 2012-05-01 MED ORDER — ONDANSETRON HCL 4 MG/2ML IJ SOLN
INTRAMUSCULAR | Status: AC
  Administered 2012-05-02: 4 mg
  Filled 2012-05-01: qty 2

## 2012-05-01 MED ORDER — ONDANSETRON 4 MG PO TBDP
4.0000 mg | ORAL_TABLET | Freq: Four times a day (QID) | ORAL | Status: DC | PRN
  Filled 2012-05-01: qty 1

## 2012-05-01 MED ORDER — INSULIN GLARGINE 100 UNIT/ML ~~LOC~~ SOLN
22.0000 [IU] | Freq: Every day | SUBCUTANEOUS | Status: DC
  Administered 2012-05-01: 22 [IU] via SUBCUTANEOUS

## 2012-05-01 MED ORDER — POTASSIUM CHLORIDE IN NACL 40-0.9 MEQ/L-% IV SOLN
INTRAVENOUS | Status: DC
  Administered 2012-05-01: 08:00:00 via INTRAVENOUS

## 2012-05-01 MED ORDER — POTASSIUM CHLORIDE CRYS ER 20 MEQ PO TBCR: 40.0000 meq | EXTENDED_RELEASE_TABLET | Freq: Three times a day (TID) | ORAL | Status: DC

## 2012-05-01 NOTE — Progress Notes (Signed)
NAME:  Linda Walls, Linda Walls             ACCOUNT NO.:  [id number]  MEDICAL RECORD NO.:  [id number]  LOCATION:  IC10                          FACILITY:  [facility]  PHYSICIAN:  Edmund Rick, MD DATE OF BIRTH:  04/21/1991  DATE OF PROCEDURE: DATE OF DISCHARGE:                                PROGRESS NOTE   REASON FOR FOLLOWUP:  Uncontrolled type 1 diabetes, with DKA.  SUBJECTIVE:  The patient feels better.  She is tolerating oral feeding this morning.  No hypoglycemia.  Remains on insulin drip until midnight at which time, she was switched over to basal bolus insulin.  OBJECTIVE:  GENERAL:  She looks good.  No acute distress. VITAL SIGNS:  Blood pressure 105/73, pulse rate 100, temperature 99.1. HEENT:  Well hydrated. NECK:  Negative for JVD. CHEST:  Clear to auscultation bilaterally.  CARDIOVASCULAR:  Normal S1 and S2.  No murmur.  No gallop. ABDOMEN:  Much better.  Very little tenderness this morning.  Bowel sounds are positive. EXTREMITIES:  No edema. CNS:  Nonfocal.  LABORATORY DATA:  Hypokalemia 2.6.  She is on potassium supplement. Sodium 138, BUN 6, and creatinine 0.4.  ASSESSMENT: 1. Diabetic ketoacidosis, resolved. 2. Type 1 diabetes, chronically uncontrolled.  Recent A1c was 10.4% on     January 30, 2012.  PLAN:  Agree with conversion to basal bolus insulin since she is not eating the same as at home.  Continue Lantus 22 units at bedtime, however, she will need prandial insulin NovoLog 5 units t.i.d. a.c. plus sliding scale only at this time.  Continue to monitor blood glucose before meals and at bedtime and advance her diet to 1800 calorie ADA diet.  Carbohydrate restricted.  I will continue followup.          ______________________________ Purcell Nails, MD    GN/MEDQ  D:  05/01/2012  T:  05/01/2012  Job:  [id number]

## 2012-05-01 NOTE — Progress Notes (Signed)
Meal time insulin held d/t cbgx2<60. Then up to 115 1hr after tx.

## 2012-05-01 NOTE — Progress Notes (Signed)
Called to patients room to check Iv for the third time.  Patient verbalized iv felt tender.  Explained to patient that she was receiving potassium through IV and that may be why she felt it was tender.  Iv flushes without difficulty and blood return noted.  Iv stopped and disconnected.  Informed patient that md would be notified.  Dr. Sherrie Mustache called and gave orders to stop IV fluids and not to start a new IV due to patient possibly going home.  Patient has medication for nausea and pain via po route.  Returning to the room to discontinue Iv and explain a new IV would not be placed. The patient stated that the iv area felt fine and she wanted to leave the iv in for now.  IV fluids remain off at this time.

## 2012-05-01 NOTE — Progress Notes (Signed)
PT TRANSFERRED TO ROOM 306. IV IN LT FOREARM PATENT. SKIN WARM AND DRY. ALERT AND ORIENTED. NO FURTHER C/O PAIN OR NAUSEA. PARENTS AT BEDSIDE ABDOMEN TENDER. PT IS NOW NPO . REPORT CALLED TO RN ON 300.

## 2012-05-01 NOTE — Progress Notes (Signed)
Subjective: The patient was initially seen this morning. She had tolerated her full liquid diet. The plan was to discharge her this afternoon if she was able to keep solid foods down. Apparently, this afternoon, she reports nausea and vomiting, but this has not been confirmed by the nursing staff.  Objective: Vital signs in last 24 hours: Filed Vitals:   05/01/12 1300 05/01/12 1400 05/01/12 1500 05/01/12 1600  BP:      Pulse: 74 94 99   Temp:    98.6 F (37 C)  TempSrc:    Oral  Resp: 17 15 15    Height:      Weight:      SpO2: 92% 99% 100%     Intake/Output Summary (Last 24 hours) at 05/01/12 1741 Last data filed at 05/01/12 1247  Gross per 24 hour  Intake 3388.11 ml  Output      0 ml  Net 3388.11 ml    Weight change: 2.597 kg (5 lb 11.6 oz)  Physical exam: Lungs: Clear to auscultation bilaterally. Heart: S1, S2, with no murmurs rubs or gallops. Abdomen: Positive bowel sounds, very minimal tenderness in the epigastrium, no distention, no rebound, no guarding. Extremities: No pedal edema.  Lab Results: Basic Metabolic Panel:  Basename 05/01/12 0445 04/30/12 0824  NA 138 137  K 2.6* 3.9  CL 108 108  CO2 22 19  GLUCOSE 162* 208*  BUN 6 21  CREATININE 0.40* 0.53  CALCIUM 8.7 9.0  MG -- --  PHOS -- --   Liver Function Tests:  [facility] 04/29/12 1823  AST 12  ALT 15  ALKPHOS 122*  BILITOT 0.2*  PROT 8.9*  ALBUMIN 4.6    Basename 04/29/12 2000  LIPASE 14  AMYLASE --   No results found for this basename: AMMONIA:2 in the last 72 hours CBC:  Basename 04/30/12 0424 04/29/12 1823  WBC 8.5 15.0*  NEUTROABS -- 12.4*  HGB 11.9* 15.2*  HCT 34.3* 43.5  MCV 84.9 84.6  PLT 295 414*   Cardiac Enzymes: No results found for this basename: CKTOTAL:3,CKMB:3,CKMBINDEX:3,TROPONINI:3 in the last 72 hours BNP: No results found for this basename: PROBNP:3 in the last 72 hours D-Dimer: No results found for this basename: DDIMER:2 in the last 72  hours CBG:  Basename 05/01/12 1738 05/01/12 1644 05/01/12 1614 05/01/12 1123 05/01/12 0744 05/01/12 0633  GLUCAP 115* 58* 48* 128* 192* 191*   Hemoglobin A1C:  Basename 04/30/12 0112  HGBA1C 12.6*   Fasting Lipid Panel: No results found for this basename: CHOL,HDL,LDLCALC,TRIG,CHOLHDL,LDLDIRECT in the last 72 hours Thyroid Function Tests:  Basename 04/30/12 0112  TSH 2.603  T4TOTAL --  FREET4 --  T3FREE --  THYROIDAB --   Anemia Panel: No results found for this basename: VITAMINB12,FOLATE,FERRITIN,TIBC,IRON,RETICCTPCT in the last 72 hours Coagulation: No results found for this basename: LABPROT:2,INR:2 in the last 72 hours Urine Drug Screen: Drugs of Abuse  No results found for this basename: labopia,  cocainscrnur,  labbenz,  amphetmu,  thcu,  labbarb    Alcohol Level: No results found for this basename: ETH:2 in the last 72 hours Urinalysis:  Basename 04/29/12 1820  COLORURINE YELLOW  LABSPEC 1.020  PHURINE 6.0  GLUCOSEU >1000*  HGBUR SMALL*  BILIRUBINUR MODERATE*  KETONESUR 40*  PROTEINUR 30*  UROBILINOGEN 0.2  NITRITE NEGATIVE  LEUKOCYTESUR NEGATIVE   Misc. Labs:   Micro: Recent Results (from the past 240 hour(s))  URINE CULTURE     Status: Normal   Collection Time   04/29/12  6:20  PM      Component Value Range Status Comment   Specimen Description URINE, CATHETERIZED   Final    Special Requests NONE   Final    Culture  Setup Time 04/30/2012 01:42   Final    Colony Count NO GROWTH   Final    Culture NO GROWTH   Final    Report Status 05/01/2012 FINAL   Final   MRSA PCR SCREENING     Status: Normal   Collection Time   04/30/12  1:18 AM      Component Value Range Status Comment   MRSA by PCR NEGATIVE  NEGATIVE Final     Studies/Results: No results found.  Medications:  Scheduled:   . bacitracin   Topical Daily  . enoxaparin  40 mg Subcutaneous Q24H  . influenza  inactive virus vaccine  0.5 mL Intramuscular Tomorrow-1000  . insulin aspart   0-5 Units Subcutaneous QHS  . insulin aspart  0-9 Units Subcutaneous TID WC  . insulin glargine  10 Units Subcutaneous Daily  . pantoprazole (PROTONIX) IV  40 mg Intravenous Q24H  . potassium chloride SA      . potassium chloride  40 mEq Oral Once  . sodium chloride      . sodium chloride      . sodium chloride      . sodium chloride      . DISCONTD: insulin aspart  0-20 Units Subcutaneous TID WC  . DISCONTD: insulin aspart  0-5 Units Subcutaneous QHS  . DISCONTD: insulin aspart  5 Units Subcutaneous TID WC  . DISCONTD: insulin glargine  22 Units Subcutaneous Daily  . DISCONTD: potassium chloride  10 mEq Intravenous Q1 Hr x 4  . DISCONTD: potassium chloride  40 mEq Oral TID   Continuous:   . dextrose 5 % and 0.9 % NaCl with KCl 40 mEq/L    . DISCONTD: sodium chloride    . DISCONTD: 0.9 % NaCl with KCl 40 mEq / L 100 mL/hr at 05/01/12 0800  . DISCONTD: 0.9 % NaCl with KCl 40 mEq / L    . DISCONTD: dextrose 5 % and 0.45% NaCl 150 mL (04/30/12 1249)  . DISCONTD: insulin (NOVOLIN-R) infusion 2 Units/hr (04/30/12 1811)   ZOX:[id number], dextrose, guaiFENesin-dextromethorphan, HYDROmorphone (DILAUDID) injection, ondansetron (ZOFRAN) IV, ondansetron, oxyCODONE-acetaminophen  Assessment: Principal Problem:  *DKA (diabetic ketoacidoses) Active Problems:  Dehydration  Abdominal  pain, other specified site  GERD (gastroesophageal reflux disease)  Nausea and vomiting  Hypoglycemia associated with diabetes   The patient was visited and assessed on a couple of occasions today. She reports nausea and vomiting, but this has not been witnessed by the nursing staff. She was started on a carbohydrate modified diet for supper, but she reports an episode of nausea and vomiting. She had hypoglycemia this afternoon. She was treated appropriately. Her blood glucose is back up to 115 following treatment. She has a history of hereditary pancreatitis, but clinically, does not appear that she has an  acute exacerbation. Her DKA has resolved. We'll keep her another night for treatment.  Plan: 1. We'll make her in by mouth. 2. We'll decrease sliding-scale NovoLog to sensitive scale and decrease Lantus from 20 to units each bedtime to 10 units each bedtime. 3. Restart IV fluid hydration with D5 normal saline with 40 mEq potassium chloride added. We'll discontinue oral potassium chloride due to reported nausea and vomiting. 4. We'll check laboratory studies in the morning.     LOS: 2 days  Angellee Cohill 05/01/2012, 5:41 PM

## 2012-05-01 NOTE — Progress Notes (Signed)
Patients father stopped Clinical research associate in hallway and verbalized the patient needed medication for nausea and she did not believe she could take the medication po.  Iv hooked up temporary to push Zofran iv through current IV access.  Patient tolerated well with no sx or sx of discomfort.  Lunch tray in the room at the bedside with 75 % of meal eaten by patient.  Dr. Sherrie Mustache notified of patients progression.  Dr. Sherrie Mustache to see patient in early afternoon for possible discharge.  Dr. Sherrie Mustache informed of patients request to leave IV in as well as wanting IV Zofran for nausea.

## 2012-05-01 NOTE — Progress Notes (Signed)
Patient ID: Linda Walls, female   DOB: 09-08-1990, 21 y.o.   MRN: [id number]

## 2012-05-02 ENCOUNTER — Inpatient Hospital Stay (HOSPITAL_COMMUNITY): Payer: BC Managed Care – PPO

## 2012-05-02 DIAGNOSIS — R112 Nausea with vomiting, unspecified: Secondary | ICD-10-CM

## 2012-05-02 LAB — LIPASE, BLOOD: Lipase: 5 U/L — ABNORMAL LOW (ref 11–59)

## 2012-05-02 LAB — CBC
Hemoglobin: 10.9 g/dL — ABNORMAL LOW (ref 12.0–15.0)
MCH: 30.3 pg (ref 26.0–34.0)
Platelets: 203 10*3/uL (ref 150–400)
RBC: 3.6 MIL/uL — ABNORMAL LOW (ref 3.87–5.11)
WBC: 8.3 10*3/uL (ref 4.0–10.5)

## 2012-05-02 LAB — COMPREHENSIVE METABOLIC PANEL
AST: 19 U/L (ref 0–37)
BUN: 5 mg/dL — ABNORMAL LOW (ref 6–23)
CO2: 24 mEq/L (ref 19–32)
Calcium: 8.4 mg/dL (ref 8.4–10.5)
Creatinine, Ser: 0.39 mg/dL — ABNORMAL LOW (ref 0.50–1.10)
GFR calc Af Amer: >90 mL/min (ref >90)
GFR calc non Af Amer: >90 mL/min (ref >90)

## 2012-05-02 LAB — GLUCOSE, CAPILLARY

## 2012-05-02 MED ORDER — HYDROMORPHONE HCL PF 1 MG/ML IJ SOLN
INTRAMUSCULAR | Status: AC
  Administered 2012-05-02: 1 mg
  Filled 2012-05-02: qty 1

## 2012-05-02 MED ORDER — ONDANSETRON HCL 4 MG/2ML IJ SOLN
INTRAMUSCULAR | Status: AC
  Administered 2012-05-02: 4 mg
  Filled 2012-05-02: qty 2

## 2012-05-02 MED ORDER — INSULIN ASPART 100 UNIT/ML ~~LOC~~ SOLN: 6.0000 [IU] | Freq: Three times a day (TID) | SUBCUTANEOUS | Status: DC

## 2012-05-02 MED ORDER — BENZONATATE 100 MG PO CAPS: 100.0000 mg | ORAL_CAPSULE | Freq: Three times a day (TID) | ORAL | Status: DC | PRN

## 2012-05-02 MED ORDER — KCL IN DEXTROSE-NACL 40-5-0.9 MEQ/L-%-% IV SOLN
INTRAVENOUS | Status: AC
  Filled 2012-05-02: qty 1000

## 2012-05-02 MED ORDER — SODIUM CHLORIDE 0.9 % IJ SOLN
INTRAMUSCULAR | Status: AC
  Filled 2012-05-02: qty 6

## 2012-05-02 NOTE — Progress Notes (Signed)
Pt d/c home via personal vehicle driven by her mother. Discharge instructions and meds reviewed with pt with good understanding. Recently medicated pt rates pain 5/10. No acute distress notd

## 2012-05-02 NOTE — Discharge Summary (Signed)
Physician Discharge Summary  Tilla Wilborn Macrae ZOX:[id number] DOB: 1990/11/02 DOA: 04/29/2012  PCP: [name], MD  Admit date: 04/29/2012 Discharge date: 05/02/2012  Time spent: Greater than 30 minutes  Recommendations for Outpatient Follow-up:  1. The patient will followup with her endocrinologist Dr. Fransico Him and primary care physician Dr.Luking.  Discharge Diagnoses:  1. Abdominal pain, nausea, and vomiting, secondary to DKA and type 1 diabetes mellitus. 2. Historically uncontrolled type 1 diabetes mellitus. Hemoglobin A1c 12.6. (Query noncompliance). 3. Hypoglycemia associated with diabetes. 4. Hereditary pancreatitis. 5. Hypokalemia. 6. Dehydration. 7. Gastroesophageal reflux disease. 8. Chronic anemia.   Discharge Condition: Improved.  Diet recommendation: Carbohydrate modified  Filed Weights   04/30/12 0500 05/01/12 0634 05/02/12 0451  Weight: 50.8 kg (111 lb 15.9 oz) 53.4 kg (117 lb 11.6 oz) 53.2 kg (117 lb 4.6 oz)    History of present illness:  The patient is a 21 year old woman with a history significant for type 1 diabetes mellitus and hereditary pancreatitis, who presented to the emergency department on 04/29/2012 with a chief complaint of abdominal pain, nausea, and vomiting. At home, her blood sugars were reading "high". In the emergency department, she was afebrile and tachycardic. Her lab data were significant for CO2 of 18, glucose of 254, BUN of 51, normal lipase, normal AST/ALT, WBC of 15.0, hemoglobin of 15.2, and platelet count of 414. She was admitted for further evaluation and management.  Hospital Course:  Clinically, the patient presented with diabetic ketoacidosis in the setting of type 1 diabetes mellitus. Her gastrointestinal symptoms were thought to be more likely secondary to DKA and acute pancreatitis. She was started on aggressive IV fluid hydration. The insulin drip protocol was started. IV Protonix was given empirically. Her nausea was treated with  as needed Zofran or Phenergan. Her abdominal pain was treated with as needed hydromorphone. For further evaluation, a number of studies were ordered. Her followup CO2 fell to 15. The insulin drip protocol was adjusted accordingly. Her serum potassium fell to 2.6. She was treated with intravenous runs of potassium chloride and oral potassium chloride once she was able to tolerate oral medications. Her urine pregnancy test was negative. Her hemoglobin A1c was 12.6. Her TSH was within normal limits at 2.6. Her followup hemoglobin fell gradually from 15.2 to 11.9, owing to the dilutional effects of the IV fluids in this patient who is clearly dehydrated on admission.  Endocrinologist, Dr. Fransico Him was consulted. He made recommendations which were followed and appreciated.  The patient's symptoms subsided. Her DKA resolved. The insulin drip was discontinued in favor of Lantus and sliding-scale NovoLog. She became hypoglycemic on a couple of occasions. She was virtually asymptomatic. She was treated accordingly. Her diet was advanced to a full liquid diet. Following the advancement, she complained of more nausea and vomiting, however, her vomiting was not witnessed by the nursing staff. In addition, when the patient complained of abdominal pain, she appeared to be very comfortable yet she continued to ask for IV hydromorphone. She was made to be n.p.o. again for 24 hours. Following the n.p.o. status, she wanted to try to eat again. Subsequently, she tolerated the full liquid diet.  She developed a nonproductive but wet cough. A chest x-ray was ordered. It revealed no acute cardiopulmonary abnormalities. She was treated accordingly with Jerilynn Som.  An element of noncompliance is assumed. On half of her usual dosing of insulin, her blood sugars were low to low normal during the last couple of days of the hospitalization. She was advised  to modify her insulin as instructed. She was instructed to call Dr. Fransico Him for  further recommendations regarding insulin therapy.  Procedures:  None  Consultations:  Endocrinologist, Dr. Fransico Him  Discharge Exam: Filed Vitals:   05/01/12 2108 05/02/12 0200 05/02/12 0451 05/02/12 0835  BP: 113/76 108/70 102/68   Pulse: 96 78 79 87  Temp: 98.8 F (37.1 C) 97.5 F (36.4 C) 98.1 F (36.7 C)   TempSrc: Oral Oral Oral   Resp: 16 16 16    Height:      Weight:   53.2 kg (117 lb 4.6 oz)   SpO2: 97% 98% 100% 99%    General: 21 year old female in no acute distress. Cardiovascular: S1, S2, with no murmurs rubs or gallops. Respiratory: Clear to auscultation bilaterally.  Discharge Instructions  Discharge Orders    Future Orders Please Complete By Expires   Diet - low sodium heart healthy      Diet Carb Modified      Increase activity slowly      Discharge instructions      Comments:   Mealtime NovoLog has been decreased to 6 units. Call Dr. Fransico Him for recommendations about dosing of your insulin if your blood sugars consistently fall below 100. Followup with Dr. Fransico Him and Dr. Gerda Diss in one to 2 weeks.       Medication List     As of 05/02/2012  2:37 PM    TAKE these medications         benzonatate 100 MG capsule   Commonly known as: TESSALON   Take 1 capsule (100 mg total) by mouth 3 (three) times daily as needed for cough.      ibuprofen 200 MG tablet   Commonly known as: ADVIL,MOTRIN   Take 400 mg by mouth every 6 (six) hours as needed. pain      insulin aspart 100 UNIT/ML injection   Commonly known as: novoLOG   Inject 6 Units into the skin 3 (three) times daily before meals. *Increase subcutaneous injection by 1 unit for every 50 units over 150.*      insulin glargine 100 UNIT/ML injection   Commonly known as: LANTUS   Inject 45 Units into the skin every evening.      multivitamins ther. w/minerals Tabs   Take 1 tablet by mouth daily. For energy      omeprazole 20 MG capsule   Commonly known as: PRILOSEC   Take 20 mg by mouth daily as  needed. For acid reflux      oxyCODONE-acetaminophen 7.5-325 MG per tablet   Commonly known as: PERCOCET   Take 1 tablet by mouth every 6 (six) hours as needed. For pain      promethazine 25 MG tablet   Commonly known as: PHENERGAN   Take 25 mg by mouth daily as needed. Nausea,vomiting      promethazine 25 MG suppository   Commonly known as: PHENERGAN   Place 1 suppository (25 mg total) rectally every 6 (six) hours as needed for nausea.           Follow-up Information    Follow up with [name], MD. In 1 week.   Contact information:   [address] [place] [address] [phone]       Follow up with NIDA,GEBRESELASSIE, MD. Schedule an appointment as soon as possible for a visit in 1 week.   Contact information:   [address] [place] [address] [phone]  The results of significant diagnostics from this hospitalization (including imaging, microbiology, ancillary and laboratory) are listed below for reference.    Significant Diagnostic Studies: Dg Chest 2 View  05/02/2012  *RADIOLOGY REPORT*  Clinical Data: Cough  CHEST - 2 VIEW  Comparison: None  Findings: The heart size and mediastinal contours are within normal limits.  Both lungs are clear.  The visualized skeletal structures are unremarkable.  IMPRESSION: Negative exam.   Original Report Authenticated By: Signa Kell, M.D.     Microbiology: Recent Results (from the past 240 hour(s))  URINE CULTURE     Status: Normal   Collection Time   04/29/12  6:20 PM      Component Value Range Status Comment   Specimen Description URINE, CATHETERIZED   Final    Special Requests NONE   Final    Culture  Setup Time 04/30/2012 01:42   Final    Colony Count NO GROWTH   Final    Culture NO GROWTH   Final    Report Status 05/01/2012 FINAL   Final   MRSA PCR SCREENING     Status: Normal   Collection Time   04/30/12  1:18 AM      Component Value Range Status Comment   MRSA by PCR  NEGATIVE  NEGATIVE Final      Labs: Basic Metabolic Panel:  Lab 05/02/12 3244 05/01/12 0445 04/30/12 0824 04/30/12 0424 04/30/12 0028  NA 139 138 137 139 139  K 3.7 2.6* 3.9 3.7 3.9  CL 108 108 108 111 110  CO2 24 22 19  18* 16*  GLUCOSE 181* 162* 208* 126* 144*  BUN 5* 6 21 26* 32*  CREATININE 0.39* 0.40* 0.53 0.57 0.61  CALCIUM 8.4 8.7 9.0 9.1 9.0  MG -- -- -- -- --  PHOS -- -- -- -- --   Liver Function Tests:  Lab 05/02/12 0615 04/29/12 1823  AST 19 12  ALT 15 15  ALKPHOS 88 122*  BILITOT 0.3 0.2*  PROT 5.6* 8.9*  ALBUMIN 2.8* 4.6    Lab 05/02/12 0615 04/29/12 2000  LIPASE 5* 14  AMYLASE -- --   No results found for this basename: AMMONIA:5 in the last 168 hours CBC:  Lab 05/02/12 0615 04/30/12 0424 04/29/12 1823  WBC 8.3 8.5 15.0*  NEUTROABS -- -- 12.4*  HGB 10.9* 11.9* 15.2*  HCT 31.3* 34.3* 43.5  MCV 86.9 84.9 84.6  PLT 203 295 414*   Cardiac Enzymes: No results found for this basename: CKTOTAL:5,CKMB:5,CKMBINDEX:5,TROPONINI:5 in the last 168 hours BNP: BNP (last 3 results) No results found for this basename: PROBNP:3 in the last 8760 hours CBG:  Lab 05/02/12 1145 05/01/12 2105 05/01/12 1738 05/01/12 1644 05/01/12 1614  GLUCAP 117* 160* 115* 58* 48*       Signed:  Deisi Salonga  [facility] 05/02/2012, 2:37 PM

## 2012-05-03 LAB — GLUCOSE, CAPILLARY: Glucose-Capillary: 160 mg/dL — ABNORMAL HIGH (ref 70–99)

## 2012-05-03 MED FILL — Bacitracin Oint 500 Unit/GM: CUTANEOUS | Qty: 14 | Status: AC

## 2012-10-13 ENCOUNTER — Telehealth: Payer: Self-pay | Admitting: Family Medicine

## 2012-10-13 NOTE — Telephone Encounter (Signed)
Not seen one year. We'll write thirty but needs to do ov soon after. wis

## 2012-10-13 NOTE — Telephone Encounter (Signed)
Nurse: Royann Shivers: [phone]  RX: Prescription request for Percocet (refill)

## 2012-10-13 NOTE — Telephone Encounter (Signed)
Pt last seen 09/29/11.

## 2012-10-14 NOTE — Telephone Encounter (Signed)
Rx written and left up front for patient pick up.  Patient notified.

## 2012-10-15 ENCOUNTER — Encounter: Payer: Self-pay | Admitting: *Deleted

## 2012-10-26 ENCOUNTER — Ambulatory Visit (INDEPENDENT_AMBULATORY_CARE_PROVIDER_SITE_OTHER): Payer: BC Managed Care – PPO | Admitting: Family Medicine

## 2012-10-26 ENCOUNTER — Encounter: Payer: Self-pay | Admitting: Family Medicine

## 2012-10-26 VITALS — BP 110/72 | Wt 121.2 lb

## 2012-10-26 DIAGNOSIS — K859 Acute pancreatitis without necrosis or infection, unspecified: Secondary | ICD-10-CM

## 2012-10-26 DIAGNOSIS — E109 Type 1 diabetes mellitus without complications: Secondary | ICD-10-CM

## 2012-10-26 DIAGNOSIS — K219 Gastro-esophageal reflux disease without esophagitis: Secondary | ICD-10-CM

## 2012-10-26 MED ORDER — HYOSCYAMINE SULFATE 0.125 MG SL SUBL: 0.1250 mg | SUBLINGUAL_TABLET | SUBLINGUAL | Status: DC | PRN

## 2012-10-26 MED ORDER — PROMETHAZINE HCL 25 MG PO TABS: 25.0000 mg | ORAL_TABLET | Freq: Four times a day (QID) | ORAL | Status: DC | PRN

## 2012-10-26 MED ORDER — PANCRELIPASE (LIP-PROT-AMYL) 6000-19000 UNITS PO CPEP: 2.0000 | ORAL_CAPSULE | Freq: Three times a day (TID) | ORAL | Status: DC

## 2012-10-26 NOTE — Progress Notes (Signed)
  Subjective:    Patient ID: Roger Kill, female    DOB: 1991/06/03, 22 y.o.   MRN: [id number]  HPI Not often trouble with acid reflux.  Exercise not so hot. Diet good, app off and on.  occas hypogly symptons. Generally his sugars in good control.  epigas discomfort off and on. Nausea. Phen for nausea. The family and patient wonders if pancreatic enzyme deficiency is leading to abdominal pain. Endocrinologist brought up the idea of considering supplementation. At times also gets significant cramps. Levsin/SL has helped this in the past.  Pneum vac in the past yr.  Review of Systems ROS otherwise negative.    Objective:   Physical Exam Alert no acute distress. HEENT mom his congestion. Lungs clear. Heart regular in rhythm. Abdomen soft good bowel sounds no discrete tenderness no rebound no guarding.       Assessment & Plan:  Impression chronic recurrent pancreatitis. 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 #3 allergic rhinitis discussed. #4 reflux stable with when necessary medication. #5 chronic pain patient does take oxycodone when abdominal pain flares up. She uses it sparingly. Plan trial of pancreatic enzyme supplementation. Refill other meds. Diet exercise discussed. Protonic pump inhibitor when necessary only. Oxycodone when necessary only. They did not need a refill at this time. Check yearly. Easily 25 minutes spent most in discussion. [name]

## 2012-11-26 ENCOUNTER — Telehealth: Payer: Self-pay | Admitting: Family Medicine

## 2012-11-26 MED ORDER — OXYCODONE-ACETAMINOPHEN 7.5-325 MG PO TABS: 1.0000 | ORAL_TABLET | Freq: Four times a day (QID) | ORAL | Status: DC | PRN

## 2012-11-26 NOTE — Telephone Encounter (Signed)
Last got # 30 on 10/13/12

## 2012-11-26 NOTE — Telephone Encounter (Signed)
Needs Prescription for Percocet 7.5  Please call patient.  Thanks

## 2012-11-26 NOTE — Telephone Encounter (Signed)
Rx up front for patient pick up. Patient notified. 

## 2012-11-26 NOTE — Telephone Encounter (Signed)
[place]. When last given and how much?

## 2012-11-29 ENCOUNTER — Other Ambulatory Visit: Payer: Self-pay | Admitting: Family Medicine

## 2012-12-11 ENCOUNTER — Other Ambulatory Visit: Payer: Self-pay | Admitting: Family Medicine

## 2012-12-23 ENCOUNTER — Inpatient Hospital Stay (HOSPITAL_COMMUNITY)
Admission: EM | Admit: 2012-12-23 | Discharge: 2012-12-25 | DRG: 551 | Disposition: A | Discharge status: Home / Self Care | Payer: BC Managed Care – PPO | Attending: Internal Medicine | Admitting: Internal Medicine

## 2012-12-23 ENCOUNTER — Encounter (HOSPITAL_COMMUNITY): Payer: Self-pay | Admitting: *Deleted

## 2012-12-23 DIAGNOSIS — E872 Acidosis, unspecified: Secondary | ICD-10-CM | POA: Diagnosis present

## 2012-12-23 DIAGNOSIS — Z794 Long term (current) use of insulin: Secondary | ICD-10-CM

## 2012-12-23 DIAGNOSIS — E871 Hypo-osmolality and hyponatremia: Secondary | ICD-10-CM | POA: Diagnosis present

## 2012-12-23 DIAGNOSIS — K2900 Acute gastritis without bleeding: Secondary | ICD-10-CM | POA: Diagnosis present

## 2012-12-23 DIAGNOSIS — I498 Other specified cardiac arrhythmias: Secondary | ICD-10-CM | POA: Diagnosis present

## 2012-12-23 DIAGNOSIS — R000 Tachycardia, unspecified: Secondary | ICD-10-CM | POA: Diagnosis present

## 2012-12-23 DIAGNOSIS — E109 Type 1 diabetes mellitus without complications: Secondary | ICD-10-CM | POA: Diagnosis present

## 2012-12-23 DIAGNOSIS — E860 Dehydration: Secondary | ICD-10-CM | POA: Diagnosis present

## 2012-12-23 DIAGNOSIS — R109 Unspecified abdominal pain: Secondary | ICD-10-CM

## 2012-12-23 DIAGNOSIS — E1069 Type 1 diabetes mellitus with other specified complication: Secondary | ICD-10-CM | POA: Diagnosis present

## 2012-12-23 DIAGNOSIS — K859 Acute pancreatitis without necrosis or infection, unspecified: Secondary | ICD-10-CM | POA: Diagnosis present

## 2012-12-23 DIAGNOSIS — Z91013 Allergy to seafood: Secondary | ICD-10-CM

## 2012-12-23 DIAGNOSIS — K219 Gastro-esophageal reflux disease without esophagitis: Secondary | ICD-10-CM | POA: Diagnosis present

## 2012-12-23 DIAGNOSIS — E11649 Type 2 diabetes mellitus with hypoglycemia without coma: Secondary | ICD-10-CM | POA: Diagnosis present

## 2012-12-23 DIAGNOSIS — N19 Unspecified kidney failure: Secondary | ICD-10-CM

## 2012-12-23 DIAGNOSIS — R112 Nausea with vomiting, unspecified: Secondary | ICD-10-CM | POA: Diagnosis present

## 2012-12-23 DIAGNOSIS — IMO0002 Reserved for concepts with insufficient information to code with codable children: Secondary | ICD-10-CM | POA: Diagnosis present

## 2012-12-23 LAB — CBC WITH DIFFERENTIAL/PLATELET
Basophils Absolute: 0 10*3/uL (ref 0.0–0.1)
Basophils Relative: 0 % (ref 0–1)
Eosinophils Absolute: 0 10*3/uL (ref 0.0–0.7)
Hemoglobin: 17 g/dL — ABNORMAL HIGH (ref 12.0–15.0)
MCH: 30.4 pg (ref 26.0–34.0)
MCHC: 35.6 g/dL (ref 30.0–36.0)
Neutro Abs: 9.9 10*3/uL — ABNORMAL HIGH (ref 1.7–7.7)
Neutrophils Relative %: 83 % — ABNORMAL HIGH (ref 43–77)
Platelets: 454 10*3/uL — ABNORMAL HIGH (ref 150–400)
RDW: 12.4 % (ref 11.5–15.5)

## 2012-12-23 LAB — COMPREHENSIVE METABOLIC PANEL
AST: 22 U/L (ref 0–37)
Albumin: 5 g/dL (ref 3.5–5.2)
Alkaline Phosphatase: 130 U/L — ABNORMAL HIGH (ref 39–117)
BUN: 48 mg/dL — ABNORMAL HIGH (ref 6–23)
Chloride: 97 mEq/L (ref 96–112)
Potassium: 3.7 mEq/L (ref 3.5–5.1)
Total Bilirubin: 0.7 mg/dL (ref 0.3–1.2)
Total Protein: 9.4 g/dL — ABNORMAL HIGH (ref 6.0–8.3)

## 2012-12-23 LAB — LACTIC ACID, PLASMA: Lactic Acid, Venous: 1.7 mmol/L (ref 0.5–2.2)

## 2012-12-23 LAB — KETONES, QUALITATIVE: Acetone, Bld: NEGATIVE

## 2012-12-23 MED ORDER — HYDROMORPHONE HCL PF 1 MG/ML IJ SOLN
0.5000 mg | INTRAMUSCULAR | Status: DC | PRN
  Administered 2012-12-24 – 2012-12-25 (×15): 0.5 mg via INTRAVENOUS
  Filled 2012-12-23 (×15): qty 1

## 2012-12-23 MED ORDER — FLEET ENEMA 7-19 GM/118ML RE ENEM: 1.0000 | ENEMA | Freq: Once | RECTAL | Status: AC | PRN

## 2012-12-23 MED ORDER — SODIUM CHLORIDE 0.9 % IV SOLN: INTRAVENOUS | Status: DC

## 2012-12-23 MED ORDER — PANTOPRAZOLE SODIUM 40 MG IV SOLR
40.0000 mg | Freq: Two times a day (BID) | INTRAVENOUS | Status: DC
  Administered 2012-12-24 – 2012-12-25 (×4): 40 mg via INTRAVENOUS
  Filled 2012-12-23 (×4): qty 40

## 2012-12-23 MED ORDER — INSULIN GLARGINE 100 UNIT/ML ~~LOC~~ SOLN
15.0000 [IU] | Freq: Every day | SUBCUTANEOUS | Status: DC
  Administered 2012-12-24: 15 [IU] via SUBCUTANEOUS
  Filled 2012-12-23 (×4): qty 0.15

## 2012-12-23 MED ORDER — BISACODYL 10 MG RE SUPP: 10.0000 mg | Freq: Every day | RECTAL | Status: DC | PRN

## 2012-12-23 MED ORDER — ONDANSETRON HCL 4 MG/2ML IJ SOLN
4.0000 mg | Freq: Once | INTRAMUSCULAR | Status: AC
  Administered 2012-12-23: 4 mg via INTRAVENOUS
  Filled 2012-12-23: qty 2

## 2012-12-23 MED ORDER — ONDANSETRON 8 MG PO TBDP
8.0000 mg | ORAL_TABLET | Freq: Once | ORAL | Status: AC
  Administered 2012-12-23: 8 mg via ORAL
  Filled 2012-12-23: qty 1

## 2012-12-23 MED ORDER — POTASSIUM CHLORIDE IN NACL 20-0.9 MEQ/L-% IV SOLN
INTRAVENOUS | Status: DC
  Administered 2012-12-23 – 2012-12-24 (×2): via INTRAVENOUS

## 2012-12-23 MED ORDER — FENTANYL CITRATE 0.05 MG/ML IJ SOLN
25.0000 ug | Freq: Once | INTRAMUSCULAR | Status: AC
  Administered 2012-12-23: 25 ug via INTRAVENOUS
  Filled 2012-12-23: qty 2

## 2012-12-23 MED ORDER — SODIUM CHLORIDE 0.9 % IV BOLUS (SEPSIS)
1000.0000 mL | Freq: Once | INTRAVENOUS | Status: AC
  Administered 2012-12-23: 1000 mL via INTRAVENOUS

## 2012-12-23 MED ORDER — ONDANSETRON HCL 4 MG/2ML IJ SOLN
4.0000 mg | INTRAMUSCULAR | Status: DC | PRN
  Administered 2012-12-24 – 2012-12-25 (×4): 4 mg via INTRAVENOUS
  Filled 2012-12-23 (×5): qty 2

## 2012-12-23 MED ORDER — ONDANSETRON HCL 4 MG/2ML IJ SOLN: 4.0000 mg | Freq: Three times a day (TID) | INTRAMUSCULAR | Status: DC | PRN

## 2012-12-23 MED ORDER — SODIUM CHLORIDE 0.9 % IJ SOLN: 3.0000 mL | Freq: Two times a day (BID) | INTRAMUSCULAR | Status: DC

## 2012-12-23 MED ORDER — ENOXAPARIN SODIUM 40 MG/0.4ML ~~LOC~~ SOLN
40.0000 mg | SUBCUTANEOUS | Status: DC
  Administered 2012-12-24 – 2012-12-25 (×2): 40 mg via SUBCUTANEOUS
  Filled 2012-12-23 (×2): qty 0.4

## 2012-12-23 MED ORDER — INSULIN ASPART 100 UNIT/ML ~~LOC~~ SOLN
0.0000 [IU] | SUBCUTANEOUS | Status: DC
  Administered 2012-12-24: 3 [IU] via SUBCUTANEOUS

## 2012-12-23 MED ORDER — INSULIN GLARGINE 100 UNIT/ML ~~LOC~~ SOLN
SUBCUTANEOUS | Status: AC
  Filled 2012-12-23: qty 10

## 2012-12-23 NOTE — ED Provider Notes (Signed)
History    CSN: [id number] Arrival date & time 12/23/12  1647  First MD Initiated Contact with Patient 12/23/12 1825     Chief Complaint  Patient presents with  . Pancreatitis   (Consider location/radiation/quality/duration/timing/severity/associated sxs/prior Treatment) HPI Comments: 22 year old female with a history of hereditary pancreatitis, insulin requiring diabetes for the last 3 years who presents with hyperglycemia, severe dehydration and abdominal pain. She states that her nausea vomiting and abdominal pain started on Sunday evening 4 days ago. This has gradually become worse and, more frequent and now is associated with some diarrhea as well. Over the last 24 hours her blood sugars have been less than 200 but she has hardly been able to tolerate any fluids at all. This has become severe today, she is unable to take anything oral this evening at all. The family last gave her Percocet yesterday for pain but noted that she was doing worse today thus they prompted a visit to the emergency department. She does have a history of diabetic ketoacidosis as well as multiple admissions to the hospital for her pancreatitis. Review of the medical record shows that she has had a severe pancreatic atrophy and inflammatory changes consistent with pancreatitis chronically as of a CT scan from 2006.  The history is provided by the patient, a relative and medical records.   Past Medical History  Diagnosis Date  . Hereditary pancreatitis   . DM type 1 (diabetes mellitus, type 1)   . Hernia   . GERD (gastroesophageal reflux disease)   . Jaundice    Past Surgical History  Procedure Laterality Date  . Hernia repair      As infant.   Family History  Problem Relation Age of Onset  . Pancreatitis Father 30    hereditary  . Hypertension Father   . GER disease Father    History  Substance Use Topics  . Smoking status: Never Smoker   . Smokeless tobacco: Never Used  . Alcohol Use: No   OB  History   Grav Para Term Preterm Abortions TAB SAB Ect Mult Living                 Review of Systems  All other systems reviewed and are negative.    Allergies  Cefzil; Shellfish allergy; and Sulfa antibiotics  Home Medications   Current Outpatient Rx  Name  Route  Sig  Dispense  Refill  . hyoscyamine (LEVSIN SL) 0.125 MG SL tablet   Sublingual   Place 0.125 mg under the tongue every 4 (four) hours as needed for cramping or diarrhea or loose stools.         Marland Kitchen ibuprofen (ADVIL,MOTRIN) 200 MG tablet   Oral   Take 400 mg by mouth every 6 (six) hours as needed. pain         . insulin aspart (NOVOLOG) 100 UNIT/ML injection   Subcutaneous   Inject 15 Units into the skin 3 (three) times daily before meals. *Increase subcutaneous injection by 1 unit for every 50 units over 150.*         . insulin glargine (LANTUS) 100 UNIT/ML injection   Subcutaneous   Inject 45 Units into the skin every evening.          . Multiple Vitamins-Minerals (MULTIVITAMINS THER. W/MINERALS) TABS   Oral   Take 1 tablet by mouth daily. For energy         . omeprazole (PRILOSEC) 20 MG capsule   Oral   Take  20 mg by mouth daily.         Marland Kitchen oxyCODONE-acetaminophen (PERCOCET) 7.5-325 MG per tablet   Oral   Take 1 tablet by mouth every 6 (six) hours as needed. For pain   30 tablet   0   . Pancrelipase, Lip-Prot-Amyl, 6000 UNITS CPEP   Oral   Take 2 capsules (12,000 Units total) by mouth 3 (three) times daily before meals.   180 capsule   6     May also had one before large snacks   . promethazine (PHENERGAN) 25 MG suppository   Rectal   Place 25 mg rectally every 6 (six) hours as needed for nausea.         . promethazine (PHENERGAN) 25 MG tablet   Oral   Take 25 mg by mouth every 6 (six) hours as needed for nausea.         . Vitamin D, Ergocalciferol, (DRISDOL) 50000 UNITS CAPS   Oral   Take 50,000 Units by mouth every 7 (seven) days.           BP 114/87  Pulse 114   Temp(Src) 98.8 F (37.1 C) (Oral)  Ht 5\' 3"  (1.6 m)  Wt 115 lb (52.164 kg)  BMI 20.38 kg/m2  SpO2 98%  LMP 11/11/2012 Physical Exam  Nursing note and vitals reviewed. Constitutional:  Ill-appearing, dehydrated appearing, mild somnolence  HENT:  Head: Normocephalic and atraumatic.  Severely dehydrated mucous membranes, dry cracked and bleeding lips  Eyes: Conjunctivae and EOM are normal. Pupils are equal, round, and reactive to light. Right eye exhibits no discharge. Left eye exhibits no discharge. No scleral icterus.  Neck: Normal range of motion. Neck supple. No JVD present. No thyromegaly present.  Cardiovascular: Regular rhythm, normal heart sounds and intact distal pulses.  Exam reveals no gallop and no friction rub.   No murmur heard. Tachycardia, no murmurs  Pulmonary/Chest: Effort normal and breath sounds normal. No respiratory distress. She has no wheezes. She has no rales.  Abdominal: Soft. Bowel sounds are normal. She exhibits no distension and no mass. There is tenderness (mild tenderness in the epigastrium, right and left upper quadrants and lower abdomen, no guarding, no peritoneal signs).  Musculoskeletal: Normal range of motion. She exhibits no edema and no tenderness.  Lymphadenopathy:    She has no cervical adenopathy.  Neurological: Coordination normal.  The patient is mildly somnolent, tired appearing, able to follow commands when asked to times but has some difficulty when asked on the first try. No obvious neurologic deficits to focal testing including strength, sensation and cranial nerves. No tremor, no limb ataxia  Skin: Skin is warm and dry. No rash noted. No erythema.  Psychiatric: She has a normal mood and affect. Her behavior is normal.    ED Course  Procedures (including critical care time) Labs Reviewed  CBC WITH DIFFERENTIAL - Abnormal; Notable for the following:    WBC 12.0 (*)    RBC 5.59 (*)    Hemoglobin 17.0 (*)    HCT 47.7 (*)    Platelets  454 (*)    Neutrophils Relative % 83 (*)    Neutro Abs 9.9 (*)    Lymphocytes Relative 9 (*)    All other components within normal limits  COMPREHENSIVE METABOLIC PANEL - Abnormal; Notable for the following:    Sodium 133 (*)    CO2 17 (*)    Glucose, Bld 197 (*)    BUN 48 (*)    Calcium 11.7 (*)  Total Protein 9.4 (*)    Alkaline Phosphatase 130 (*)    All other components within normal limits  LIPASE, BLOOD - Abnormal; Notable for the following:    Lipase 7 (*)    All other components within normal limits  SALICYLATE LEVEL - Abnormal; Notable for the following:    Salicylate Lvl <2.0 (*)    All other components within normal limits  KETONES, QUALITATIVE  LACTIC ACID, PLASMA  URINALYSIS, ROUTINE W REFLEX MICROSCOPIC  PREGNANCY, URINE   No results found. 1. Metabolic acidosis   2. Dehydration   3. Pancreatitis   4. Uremia     MDM  The patient appears dehydrated, her blood sugar is 200, she does have an acidosis with a low CO2 and anion gap of 19. This could be related to her hyperglycemia to a blood sugar less than 200 with an anion gap of 19 base they were concerned for more of a lactic acidosis causing her symptoms than a diabetic ketoacidosis. We'll obtain a lactic acid, pursue fluid rehydration, labs to gauge her illness. At this time I suspect the patient will require fluid rehydration in an inpatient setting due to her acidosis.  The pt has an increased anion gap acidosis - this is possibly related to a severe dehdyrated state.  She has required multiple doses of IVF, she has required multiple doses of pain medicine - IV fentanyl and aniemetics.  She has ongoing tachycardia but her MS is slightly improving.    I have discussed her care with Dr. Orvan Falconer of the hospitalist service who will admit the patient to the hospital for ongoing hydration and treatment.  I do not believe that her sx are from DKA as she did not have serum ketones.  Vida Roller, MD 12/23/12  2040

## 2012-12-23 NOTE — ED Notes (Signed)
Pt with N/V since Monday per father

## 2012-12-23 NOTE — H&P (Signed)
[facility] History and Physical  Linda Walls  GNF:[id number]  DOB: 1991/06/09   DOA: 12/23/2012   PCP:   [name], MD   Chief Complaint:  Nausea and vomiting for 3 days  HPI: Linda Walls is a 22 y.o. female.  Young Caucasian lady with known hereditary pancreatitis and diabetes type 1 brought in by her father with a history of vomiting and anorexia since Monday. There has been no blood in the vomitus and it has been associated with abdominal pain. Her blood sugars have been uncontrolled since Tuesday been as high as 500 but, but gradually coming under control. Patient's uses Lantus with a short-acting insulin. The patient usually self administers these medications.  In the emergency room patient was found to the markedly dehydrated, marked hemoconcentration without evidence of DKA. Hospitalist service was called for admission.  At hospitalist interview after at least 2 L normal saline bolus, the patient is still looking very dehydrated  but sits upright easily without any lightheadedness or discomfort, and is oriented to her location,but appears unable to remember what type of insulin she takes or when she takes it on when was her last dose. Her father repeatedly tries to answer questions for her and says this is the second time she is become confused and dehydrated. 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  When asked about pain and varios locations of her body, patient responds "uh ha", indicating, "Yes", to every location.  Father reports she has not vomited since yesterday but has had nothing to eat or drink despite attempts to get her to drink Gatorade. Patient reports she's had nothing to eat or drink since Tuesday.   Rewiew of Systems:  Unable to obtain since patient collapse is into her prolonged silences when asked most questions   Past Medical History  Diagnosis Date  . Hereditary pancreatitis   . DM type 1 (diabetes mellitus,  type 1)   . Hernia   . GERD (gastroesophageal reflux disease)   . Jaundice     Past Surgical History  Procedure Laterality Date  . Hernia repair      As infant.    Medications:  HOME MEDS: Prior to Admission medications   Medication Sig Start Date End Date Taking? Authorizing Provider  hyoscyamine (LEVSIN SL) 0.125 MG SL tablet Place 0.125 mg under the tongue every 4 (four) hours as needed for cramping or diarrhea or loose stools.   Yes Historical Provider, MD  ibuprofen (ADVIL,MOTRIN) 200 MG tablet Take 400 mg by mouth every 6 (six) hours as needed. pain   Yes Historical Provider, MD  insulin aspart (NOVOLOG) 100 UNIT/ML injection Inject 15 Units into the skin 3 (three) times daily before meals. *Increase subcutaneous injection by 1 unit for every 50 units over 150.* 05/02/12  Yes Elliot Cousin, MD  insulin glargine (LANTUS) 100 UNIT/ML injection Inject 45 Units into the skin every evening.    Yes Historical Provider, MD  Multiple Vitamins-Minerals (MULTIVITAMINS THER. W/MINERALS) TABS Take 1 tablet by mouth daily. For energy   Yes Historical Provider, MD  omeprazole (PRILOSEC) 20 MG capsule Take 20 mg by mouth daily.   Yes Historical Provider, MD  oxyCODONE-acetaminophen (PERCOCET) 7.5-325 MG per tablet Take 1 tablet by mouth every 6 (six) hours as needed. For pain 11/26/12  Yes Merlyn Albert, MD  Pancrelipase, Lip-Prot-Amyl, 6000 UNITS CPEP Take 2 capsules (12,000 Units total) by mouth 3 (three) times daily before meals. 10/26/12  Yes Merlyn Albert, MD  promethazine (PHENERGAN) 25 MG suppository Place 25 mg rectally every 6 (six) hours as needed for nausea.   Yes Historical Provider, MD  promethazine (PHENERGAN) 25 MG tablet Take 25 mg by mouth every 6 (six) hours as needed for nausea.   Yes Historical Provider, MD  Vitamin D, Ergocalciferol, (DRISDOL) 50000 UNITS CAPS Take 50,000 Units by mouth every 7 (seven) days.  09/15/12   Historical Provider, MD     Allergies:  Allergies   Allergen Reactions  . Cefzil (Cefprozil)     Sickness  . Shellfish Allergy Nausea And Vomiting  . Sulfa Antibiotics Rash    Social History:   reports that she has never smoked. She has never used smokeless tobacco. She reports that she does not drink alcohol or use illicit drugs.  Family History: Family History  Problem Relation Age of Onset  . Pancreatitis Father 30    hereditary  . Hypertension Father   . GER disease Father      Physical Exam: Filed Vitals:   12/23/12 1654  BP: 114/87  Pulse: 114  Temp: 98.8 F (37.1 C)  TempSrc: Oral  Height: 5\' 3"  (1.6 m)  Weight: 52.164 kg (115 lb)  SpO2: 98%   Blood pressure 114/87, pulse 114, temperature 98.8 F (37.1 C), temperature source Oral, height 5\' 3"  (1.6 m), weight 52.164 kg (115 lb), last menstrual period 11/11/2012, SpO2 98.00%. Body mass index is 20.38 kg/(m^2).   GEN:  Pleasant thin young Caucasian lady sitting up in bed no acute respiratory distress; cooperative with exam PSYCH:  alert and oriented ;  no anxiety; affect is flat. HEENT: Mucous membranes pink, anicteric, markedly dry; PERRLA; EOM intact; no cervical lymphadenopathy nor thyromegaly or carotid bruit; no JVD; Breasts:: Not examined CHEST WALL: No tenderness CHEST: Normal respiration, clear to auscultation bilaterally HEART: Tachycardia; regular rhythm; no murmurs rubs or gallops BACK: No kyphosis no scoliosis; no CVA tenderness ABDOMEN: Scaphoid, soft non-tender; no masses, no organomegaly, no pannus; no intertriginous candida. Rectal Exam: Not done EXTREMITIES: No bone or joint deformity; ; no edema; no ulcerations. Genitalia: not examined PULSES: 2+ and symmetric SKIN: Normal hydration no ulceration; mottling of both arms. CNS: Cranial nerves 2-12 grossly intact no focal lateralizing neurologic deficit   Labs on Admission:  Basic Metabolic Panel:  Recent Labs Lab 12/23/12 1732  NA 133*  K 3.7  CL 97  CO2 17*  GLUCOSE 197*  BUN 48*   CREATININE 0.83  CALCIUM 11.7*   Liver Function Tests:  Recent Labs Lab 12/23/12 1732  AST 22  ALT 25  ALKPHOS 130*  BILITOT 0.7  PROT 9.4*  ALBUMIN 5.0    Recent Labs Lab 12/23/12 1732  LIPASE 7*   No results found for this basename: AMMONIA,  in the last 168 hours CBC:  Recent Labs Lab 12/23/12 1732  WBC 12.0*  NEUTROABS 9.9*  HGB 17.0*  HCT 47.7*  MCV 85.3  PLT 454*   Cardiac Enzymes: No results found for this basename: CKTOTAL, CKMB, CKMBINDEX, TROPONINI,  in the last 168 hours BNP: No components found with this basename: POCBNP,  D-dimer: No components found with this basename: D-DIMER,  CBG: No results found for this basename: GLUCAP,  in the last 168 hours  Radiological Exams on Admission: No results found.    Assessment/Plan   Active Problems:   Hereditary pancreatitis   DM type 1 (diabetes mellitus, type 1)   Dehydration   Nausea and vomiting  Acute gastritis ?? metabolic encephalopathy  PLAN:  we'll give vigorous IV fluid hydration with normal saline with potassium, and recheck chemistry in the morning. Check ABG with morning labs.  Will give IV Protonix for presumed gastritis   because of her complaints of extreme anorexia, and for ease of correction of acute derangement, and because of a history of pancreatitis exacerbation in the setting of the normal pancreatic enzyme, we will keep her n.p.o.   Because she has type 1 diabetes, and has a history  of recurrent DKA, we'll give a reduced dose of Lantus , 15 units , and every 4 hours moderate sliding scale insulin; monitor for hypoglycemia.  She is not supplied urine samples since admission; we note last menstrual period 6 weeks ago; Will continue efforts to get a urine sample for urinalysis, pregnancy test, and urine drug screen.  If her response to questions does not change, after significant rehydration, to consider asking her father to leave the room when interviewing her, and  consider social work consult.   Other plans as per orders.  Code Status:  focal Family Communication:  Plans discuss with patient and f father  at bedside Disposition Plan:  depending on response to initial therapy     Daunte Oestreich Nocturnist [facility] Pager [phone]   12/23/2012, 8:23 PM

## 2012-12-23 NOTE — ED Notes (Signed)
Left sided abd pain with n/v/d and hyperglycemia x 3 days.   Decreased PO intake.

## 2012-12-23 NOTE — ED Notes (Signed)
MD at bedside. 

## 2012-12-24 DIAGNOSIS — E872 Acidosis: Secondary | ICD-10-CM

## 2012-12-24 DIAGNOSIS — R109 Unspecified abdominal pain: Secondary | ICD-10-CM

## 2012-12-24 LAB — GLUCOSE, CAPILLARY
Glucose-Capillary: 172 mg/dL — ABNORMAL HIGH (ref 70–99)
Glucose-Capillary: 183 mg/dL — ABNORMAL HIGH (ref 70–99)
Glucose-Capillary: 208 mg/dL — ABNORMAL HIGH (ref 70–99)
Glucose-Capillary: 216 mg/dL — ABNORMAL HIGH (ref 70–99)
Glucose-Capillary: 48 mg/dL — ABNORMAL LOW (ref 70–99)
Glucose-Capillary: 86 mg/dL (ref 70–99)
Glucose-Capillary: 89 mg/dL (ref 70–99)
Glucose-Capillary: 91 mg/dL (ref 70–99)
Glucose-Capillary: 92 mg/dL (ref 70–99)

## 2012-12-24 LAB — URINE MICROSCOPIC-ADD ON

## 2012-12-24 LAB — CBC
HCT: 37.6 % (ref 36.0–46.0)
Hemoglobin: 13.5 g/dL (ref 12.0–15.0)
MCH: 30.8 pg (ref 26.0–34.0)
MCHC: 35.9 g/dL (ref 30.0–36.0)

## 2012-12-24 LAB — PREGNANCY, URINE: Preg Test, Ur: NEGATIVE

## 2012-12-24 LAB — URINALYSIS, ROUTINE W REFLEX MICROSCOPIC
Glucose, UA: 250 mg/dL — AB
Leukocytes, UA: NEGATIVE
pH: 6 (ref 5.0–8.0)

## 2012-12-24 LAB — BASIC METABOLIC PANEL
BUN: 33 mg/dL — ABNORMAL HIGH (ref 6–23)
GFR calc non Af Amer: >90 mL/min (ref >90)
Glucose, Bld: 95 mg/dL (ref 70–99)
Potassium: 3.6 mEq/L (ref 3.5–5.1)

## 2012-12-24 LAB — RAPID URINE DRUG SCREEN, HOSP PERFORMED
Barbiturates: NOT DETECTED
Benzodiazepines: NOT DETECTED
Cocaine: NOT DETECTED
Opiates: NOT DETECTED

## 2012-12-24 LAB — BLOOD GAS, ARTERIAL
Bicarbonate: 16.9 mEq/L — ABNORMAL LOW (ref 20.0–24.0)
TCO2: 15.2 mmol/L (ref 0–100)
pCO2 arterial: 30.9 mmHg — ABNORMAL LOW (ref 35.0–45.0)
pH, Arterial: 7.357 (ref 7.350–7.450)
pO2, Arterial: 108 mmHg — ABNORMAL HIGH (ref 80.0–100.0)

## 2012-12-24 LAB — HEMOGLOBIN A1C: Hgb A1c MFr Bld: 10 % — ABNORMAL HIGH (ref <5.7)

## 2012-12-24 MED ORDER — KCL IN DEXTROSE-NACL 20-5-0.45 MEQ/L-%-% IV SOLN
INTRAVENOUS | Status: DC
  Administered 2012-12-24 – 2012-12-25 (×4): via INTRAVENOUS

## 2012-12-24 MED ORDER — DEXTROSE 50 % IV SOLN
INTRAVENOUS | Status: AC
  Filled 2012-12-24: qty 50

## 2012-12-24 MED ORDER — INSULIN ASPART 100 UNIT/ML ~~LOC~~ SOLN
0.0000 [IU] | Freq: Three times a day (TID) | SUBCUTANEOUS | Status: DC
  Administered 2012-12-25: 8 [IU] via SUBCUTANEOUS
  Administered 2012-12-25: 3 [IU] via SUBCUTANEOUS

## 2012-12-24 MED ORDER — INSULIN ASPART 100 UNIT/ML ~~LOC~~ SOLN: 0.0000 [IU] | Freq: Every day | SUBCUTANEOUS | Status: DC

## 2012-12-24 NOTE — Care Management Note (Signed)
    Page 1 of 1   12/24/2012     1:33:20 PM   CARE MANAGEMENT NOTE 12/24/2012  Patient:  Linda Walls, Linda Walls   Account Number:  [id number]  Date Initiated:  12/24/2012  Documentation initiated by:  Sharrie Rothman  Subjective/Objective Assessment:   Pt admitted from home with pancreatitis. Pt lives with her father and mother and will return home at discharge. Pt is independent with ADL's.     Action/Plan:   No CM needs noted.   Anticipated DC Date:  12/26/2012   Anticipated DC Plan:  HOME/SELF CARE         Choice offered to / List presented to:             Status of service:  Completed, signed off Medicare Important Message given?   (If response is "NO", the following Medicare IM given date fields will be blank) Date Medicare IM given:   Date Additional Medicare IM given:    Discharge Disposition:  HOME/SELF CARE  Per UR Regulation:    If discussed at Long Length of Stay Meetings, dates discussed:    Comments:  12/24/12 1320 Arlyss Queen, RN BSN CM

## 2012-12-24 NOTE — Progress Notes (Signed)
[facility] PROGRESS NOTE  Linda Walls ZOX:[id number] DOB: 1991/06/19 DOA: 12/23/2012 PCP: [name], MD  Assessment/Plan: Hereditary pancreatitis : slight improvement this am. Tolerating clear liquids but does not want to advance. Pain controlled with analgesic provided.Pt is afebrile with white count within limits of normal, non-toxic appearing. Monitor   DM type 1 (diabetes mellitus, type 1): uncontrolled. Episode of hypoglycemia early this am. Resolved with juice. PO intake unreliable. CBG range still on low side. Will consider decreasing lantus and/or changing IV fluid to D5 1/2NS.    Dehydration: improving with IV fluids per lab work and clinical presentation. Will continue fluids at current rate. Recheck in am.    Nausea and vomiting : continues with mild nausea but no vomiting. Tolerating clear liquids somewhat. Will not advance diet at this time.   Acute gastritis :continues with nausea no vomiting. Mild pain with po intake. No diarrhea.   ?? metabolic encephalopathy: affect somewhat flat. Oriented x3. Follow commands. Defers to father to answer questions regarding details of her diabetes and general health.    Code Status: full Family Communication: father at bedside Disposition Plan: home when ready hopefully tomorrow   Consultants:  none  Procedures:  none  Antibiotics:  none  HPI/Subjective: Sitting up watching TV. Reports mild nausea but tolerating clears "ok". Continues with abdominal pain but analgesics helping.  Objective: Filed Vitals:   12/24/12 0423 12/24/12 0428 12/24/12 0433 12/24/12 0717  BP: 103/66 111/73 118/83   Pulse: 92 94 125   Temp: 98.3 F (36.8 C)     TempSrc: Oral     Resp: 16 16 18    Height:      Weight:    50.1 kg (110 lb 7.2 oz)  SpO2: 100% 100% 98%     Intake/Output Summary (Last 24 hours) at 12/24/12 0926 Last data filed at 12/24/12 0700  Gross per 24 hour  Intake    120 ml  Output    175 ml  Net    -55 ml    Filed Weights   12/23/12 1654 12/23/12 2209 12/24/12 0717  Weight: 52.164 kg (115 lb) 48.1 kg (106 lb 0.7 oz) 50.1 kg (110 lb 7.2 oz)    Exam:   General:  Well nourished NAD  Cardiovascular: tachycardic but regular No mgr no LE edema  Respiratory: normal effort BS clear to ausculation bilaterally no wheeze no rhonchi  Abdomen: flat soft +BS non-tender to palpation no guarding no rebound  Musculoskeletal: no clubbing no cyanosis, joints without swelling full rom   Data Reviewed: Basic Metabolic Panel:  Recent Labs Lab 12/23/12 1732 12/23/12 2307 12/24/12 0440  NA 133*  --  139  K 3.7  --  3.6  CL 97  --  109  CO2 17*  --  17*  GLUCOSE 197*  --  95  BUN 48*  --  33*  CREATININE 0.83  --  0.76  CALCIUM 11.7*  --  9.5  MG  --  2.5  --    Liver Function Tests:  Recent Labs Lab 12/23/12 1732  AST 22  ALT 25  ALKPHOS 130*  BILITOT 0.7  PROT 9.4*  ALBUMIN 5.0    Recent Labs Lab 12/23/12 1732  LIPASE 7*   No results found for this basename: AMMONIA,  in the last 168 hours CBC:  Recent Labs Lab 12/23/12 1732 12/24/12 0440  WBC 12.0* 10.2  NEUTROABS 9.9*  --   HGB 17.0* 13.5  HCT 47.7* 37.6  MCV 85.3  85.6  PLT 454* 313   Cardiac Enzymes: No results found for this basename: CKTOTAL, CKMB, CKMBINDEX, TROPONINI,  in the last 168 hours BNP (last 3 results) No results found for this basename: PROBNP,  in the last 8760 hours CBG:  Recent Labs Lab 12/24/12 0013 12/24/12 0412 12/24/12 0454  GLUCAP 89 48* 92    No results found for this or any previous visit (from the past 240 hour(s)).   Studies: No results found.  Scheduled Meds: . dextrose      . enoxaparin (LOVENOX) injection  40 mg Subcutaneous Q24H  . insulin aspart  0-15 Units Subcutaneous Q4H  . insulin glargine  15 Units Subcutaneous QHS  . pantoprazole (PROTONIX) IV  40 mg Intravenous Q12H  . sodium chloride  3 mL Intravenous Q12H   Continuous Infusions: . 0.9 % NaCl with KCl  20 mEq / L 150 mL/hr at 12/24/12 1610    Principal Problem:   Acute gastritis Active Problems:   Hereditary pancreatitis   Hyponatremia   DM type 1 (diabetes mellitus, type 1)   Sinus tachycardia   Dehydration   Nausea and vomiting   Hypoglycemia associated with diabetes    Time spent: 35 minutes    [facility] M  [facility] Pager [phone]. If 7PM-7AM, please contact night-coverage at [email or web address], password [facility] 12/24/2012, 9:26 AM  LOS: 1 day   Attending note:  Patient seen and independently examined.  Above note reviewed.  She has been admitted with abdominal discomfort and dehydration.  She appears to be doing better with IV hydration.  She has been started on a clear liquid diet which can be advanced as tolerated.  Abdominal pain appears to be improving as well. She has had episodes of hypoglycemia, but lantus is being adjusted until she is taking po more reliably.  Anticipate discharge home in the next 24-48 hours.  MEMON,JEHANZEB

## 2012-12-24 NOTE — Progress Notes (Addendum)
Pt had CBG of 62.   Gave pt apple juice with sugar. Will check back in 15 mins.   1015: CBG 91

## 2012-12-24 NOTE — Progress Notes (Signed)
UR chart review completed.  

## 2012-12-24 NOTE — Progress Notes (Signed)
At 0412, patients CBG was 48, gave patient one cup of OJ with sugar. Started hypoglycemia protocol and ordered a stat CBG. Will recheck the patients blood glucose and continue to monitor. Rechecked patients blood glucose and it was 92, will not give the patient IV dextrose at this time.

## 2012-12-25 LAB — BASIC METABOLIC PANEL
BUN: 6 mg/dL (ref 6–23)
Creatinine, Ser: 0.46 mg/dL — ABNORMAL LOW (ref 0.50–1.10)
GFR calc Af Amer: >90 mL/min (ref >90)
GFR calc non Af Amer: >90 mL/min (ref >90)
Glucose, Bld: 290 mg/dL — ABNORMAL HIGH (ref 70–99)
Potassium: 3.4 mEq/L — ABNORMAL LOW (ref 3.5–5.1)

## 2012-12-25 LAB — CBC
HCT: 32 % — ABNORMAL LOW (ref 36.0–46.0)
Hemoglobin: 11.3 g/dL — ABNORMAL LOW (ref 12.0–15.0)
MCH: 30.5 pg (ref 26.0–34.0)
MCHC: 35.3 g/dL (ref 30.0–36.0)
RDW: 12.3 % (ref 11.5–15.5)

## 2012-12-25 MED ORDER — OXYCODONE-ACETAMINOPHEN 5-325 MG PO TABS
1.0000 | ORAL_TABLET | ORAL | Status: DC | PRN
Start: 2012-12-25 — End: 2012-12-25
  Administered 2012-12-25: 1 via ORAL
  Filled 2012-12-25: qty 1

## 2012-12-25 MED ORDER — SODIUM CHLORIDE 0.9 % IV SOLN
INTRAVENOUS | Status: DC
  Administered 2012-12-25: 09:00:00 via INTRAVENOUS

## 2012-12-25 MED ORDER — PROMETHAZINE HCL 25 MG PO TABS: 25.0000 mg | ORAL_TABLET | Freq: Four times a day (QID) | ORAL | Status: DC | PRN

## 2012-12-25 MED ORDER — OXYCODONE-ACETAMINOPHEN 7.5-325 MG PO TABS: 1.0000 | ORAL_TABLET | Freq: Four times a day (QID) | ORAL | Status: DC | PRN

## 2012-12-25 NOTE — Progress Notes (Signed)
Pt is to be discharged home today. Pt is in NAD, IV is out, all paperwork has been reviewed/discussed with patient, and there are no questions/concerns at this time. Assessment is unchanged from this morning. Pt is to be accompanied downstairs by staff and family via wheelchair.  

## 2012-12-25 NOTE — Discharge Summary (Signed)
Physician Discharge Summary  Linda Walls ZOX:[id number] DOB: 01-19-1991 DOA: 12/23/2012  PCP: [name], MD  Admit date: 12/23/2012 Discharge date: 12/25/2012  Time spent: 35 minutes  Recommendations for Outpatient Follow-up:  1. Follow up with primary care doctor in 2 weeks  Discharge Diagnoses:  Principal Problem:   Acute gastritis Active Problems:   Hereditary pancreatitis   Hyponatremia   DM type 1 (diabetes mellitus, type 1)   Sinus tachycardia   Dehydration   Nausea and vomiting   Hypoglycemia associated with diabetes   Discharge Condition: improved  Diet recommendation: low fat, low carb  Filed Weights   12/23/12 2209 12/24/12 0717 12/25/12 0435  Weight: 48.1 kg (106 lb 0.7 oz) 50.1 kg (110 lb 7.2 oz) 54.6 kg (120 lb 5.9 oz)    History of present illness:  Linda Walls is a 22 y.o. female. Young Caucasian lady with known hereditary pancreatitis and diabetes type 1 brought in by her father with a history of vomiting and anorexia since Monday. There has been no blood in the vomitus and it has been associated with abdominal pain. Her blood sugars have been uncontrolled since Tuesday been as high as 500 but, but gradually coming under control. Patient's uses Lantus with a short-acting insulin. The patient usually self administers these medications.  In the emergency room patient was found to the markedly dehydrated, marked hemoconcentration without evidence of DKA. Hospitalist service was called for admission.  At hospitalist interview after at least 2 L normal saline bolus, the patient is still looking very dehydrated but sits upright easily without any lightheadedness or discomfort, and is oriented to her location,but appears unable to remember what type of insulin she takes or when she takes it on when was her last dose. Her father repeatedly tries to answer questions for her and says this is the second time she is become confused and dehydrated. 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   Hospital Course:  This patient was admitted for nausea and vomiting as well as dehydration. She has a known history of hereditary pancreatitis as well as insulin-dependent type 1 diabetes. Lipase was found to be normal range on admission. Patient was kept n.p.o. and aggressively hydrated with IV fluids. As her abdominal pain and nausea improved, her diet was slowly advanced. She is now tolerating a solid diet without any vomiting or worsening abdominal pain. She is requiring minimal pain medication to help control her symptoms. She was aggressively hydrated with IV fluids and her volume status has not improved. Blood sugars are also improved. She feels comfortable discharging home. She will follow up with her primary care physician in 2 weeks.  Procedures:  none  Consultations:  none  Discharge Exam: Filed Vitals:   12/25/12 0522 12/25/12 0523 12/25/12 1025 12/25/12 1510  BP: 116/77 119/78 111/78 109/71  Pulse: 97 108 92 104  Temp:   98.6 F (37 C) 98.9 F (37.2 C)  TempSrc:   Oral Oral  Resp:   18 19  Height:      Weight:      SpO2:   100% 100%    General: NAD Cardiovascular: S1, S2 RRR Respiratory: CTA B  Discharge Instructions  Discharge Orders   Future Orders Complete By Expires     Call MD for:  persistant nausea and vomiting  As directed     Call MD for:  severe uncontrolled pain  As directed     Diet Carb  Modified  As directed     Increase activity slowly  As directed         Medication List         hyoscyamine 0.125 MG SL tablet  Commonly known as:  LEVSIN SL  Place 0.125 mg under the tongue every 4 (four) hours as needed for cramping or diarrhea or loose stools.     ibuprofen 200 MG tablet  Commonly known as:  ADVIL,MOTRIN  Take 400 mg by mouth every 6 (six) hours as needed. pain     insulin aspart 100 UNIT/ML injection  Commonly known as:  novoLOG  Inject 15 Units into the skin 3 (three)  times daily before meals. *Increase subcutaneous injection by 1 unit for every 50 units over 150.*     insulin glargine 100 UNIT/ML injection  Commonly known as:  LANTUS  Inject 45 Units into the skin every evening.     multivitamins ther. w/minerals Tabs  Take 1 tablet by mouth daily. For energy     omeprazole 20 MG capsule  Commonly known as:  PRILOSEC  Take 20 mg by mouth daily.     oxyCODONE-acetaminophen 7.5-325 MG per tablet  Commonly known as:  PERCOCET  Take 1-2 tablets by mouth every 6 (six) hours as needed. For pain     Pancrelipase (Lip-Prot-Amyl) 6000 UNITS Cpep  Take 2 capsules (12,000 Units total) by mouth 3 (three) times daily before meals.     promethazine 25 MG tablet  Commonly known as:  PHENERGAN  Take 1 tablet (25 mg total) by mouth every 6 (six) hours as needed for nausea.     promethazine 25 MG suppository  Commonly known as:  PHENERGAN  Place 25 mg rectally every 6 (six) hours as needed for nausea.     Vitamin D (Ergocalciferol) 50000 UNITS Caps  Commonly known as:  DRISDOL  Take 50,000 Units by mouth every 7 (seven) days.       Allergies  Allergen Reactions  . Cefzil (Cefprozil)     Sickness  . Shellfish Allergy Nausea And Vomiting  . Sulfa Antibiotics Rash       Follow-up Information   Follow up with [name], MD. Schedule an appointment as soon as possible for a visit in 2 weeks.   Contact information:   [address] [place] [address] [phone]        The results of significant diagnostics from this hospitalization (including imaging, microbiology, ancillary and laboratory) are listed below for reference.    Significant Diagnostic Studies: No results found.  Microbiology: No results found for this or any previous visit (from the past 240 hour(s)).   Labs: Basic Metabolic Panel:  Recent Labs Lab 12/23/12 1732 12/23/12 2307 12/24/12 0440 12/25/12 0613  NA 133*  --  139 133*  K 3.7  --  3.6 3.4*  CL 97   --  109 101  CO2 17*  --  17* 23  GLUCOSE 197*  --  95 290*  BUN 48*  --  33* 6  CREATININE 0.83  --  0.76 0.46*  CALCIUM 11.7*  --  9.5 8.8  MG  --  2.5  --   --    Liver Function Tests:  Recent Labs Lab 12/23/12 1732  AST 22  ALT 25  ALKPHOS 130*  BILITOT 0.7  PROT 9.4*  ALBUMIN 5.0    Recent Labs Lab 12/23/12 1732  LIPASE 7*   No results found for this basename:  AMMONIA,  in the last 168 hours CBC:  Recent Labs Lab 12/23/12 1732 12/24/12 0440 12/25/12 0613  WBC 12.0* 10.2 5.8  NEUTROABS 9.9*  --   --   HGB 17.0* 13.5 11.3*  HCT 47.7* 37.6 32.0*  MCV 85.3 85.6 86.3  PLT 454* 313 192   Cardiac Enzymes: No results found for this basename: CKTOTAL, CKMB, CKMBINDEX, TROPONINI,  in the last 168 hours BNP: BNP (last 3 results) No results found for this basename: PROBNP,  in the last 8760 hours CBG:  Recent Labs Lab 12/24/12 1618 12/24/12 1816 12/24/12 2009 12/25/12 0726 12/25/12 1136  GLUCAP 172* 208* 216* 278* 157*       Signed:  Joya Willmott  [facility] 12/25/2012, 5:47 PM

## 2012-12-27 LAB — GLUCOSE, CAPILLARY: Glucose-Capillary: 96 mg/dL (ref 70–99)

## 2013-02-16 ENCOUNTER — Telehealth: Payer: Self-pay | Admitting: Family Medicine

## 2013-02-16 NOTE — Telephone Encounter (Signed)
Last office visit 10/26/12. Oxycodone 7.5/325 mg #30 1 q 6 hrs prn no refills was last rx'ed on 11/26/12

## 2013-02-16 NOTE — Telephone Encounter (Signed)
Patient needs Rx for oxycodone °

## 2013-02-16 NOTE — Telephone Encounter (Signed)
Last filled 12/25/12 

## 2013-02-16 NOTE — Telephone Encounter (Signed)
Dr Brett Canales to see on Thursday

## 2013-02-17 ENCOUNTER — Other Ambulatory Visit: Payer: Self-pay | Admitting: *Deleted

## 2013-02-17 MED ORDER — OXYCODONE-ACETAMINOPHEN 7.5-325 MG PO TABS: 1.0000 | ORAL_TABLET | Freq: Four times a day (QID) | ORAL | Status: DC | PRN

## 2013-02-17 NOTE — Telephone Encounter (Signed)
Rx printed and left up front for patient pick up. Patient notified. 

## 2013-02-17 NOTE — Telephone Encounter (Signed)
Ok times thirty. Let family know with new rules regarding highly scheduled narcotics, pleae sched ov before any furthr refills. We saw in April , let them know that long term I'll need to see q6 mosd if I'm prerscribing oxycodone

## 2013-03-06 ENCOUNTER — Encounter (HOSPITAL_COMMUNITY): Payer: Self-pay

## 2013-03-06 ENCOUNTER — Inpatient Hospital Stay (HOSPITAL_COMMUNITY)
Admission: EM | Admit: 2013-03-06 | Discharge: 2013-03-08 | DRG: 295 | Disposition: A | Discharge status: Home / Self Care | Payer: BC Managed Care – PPO | Attending: Internal Medicine | Admitting: Internal Medicine

## 2013-03-06 DIAGNOSIS — D473 Essential (hemorrhagic) thrombocythemia: Secondary | ICD-10-CM

## 2013-03-06 DIAGNOSIS — E871 Hypo-osmolality and hyponatremia: Secondary | ICD-10-CM

## 2013-03-06 DIAGNOSIS — D649 Anemia, unspecified: Secondary | ICD-10-CM

## 2013-03-06 DIAGNOSIS — K859 Acute pancreatitis without necrosis or infection, unspecified: Secondary | ICD-10-CM | POA: Diagnosis present

## 2013-03-06 DIAGNOSIS — D75839 Thrombocytosis, unspecified: Secondary | ICD-10-CM

## 2013-03-06 DIAGNOSIS — R109 Unspecified abdominal pain: Secondary | ICD-10-CM

## 2013-03-06 DIAGNOSIS — E11649 Type 2 diabetes mellitus with hypoglycemia without coma: Secondary | ICD-10-CM

## 2013-03-06 DIAGNOSIS — N12 Tubulo-interstitial nephritis, not specified as acute or chronic: Secondary | ICD-10-CM | POA: Diagnosis present

## 2013-03-06 DIAGNOSIS — K2900 Acute gastritis without bleeding: Secondary | ICD-10-CM

## 2013-03-06 DIAGNOSIS — E876 Hypokalemia: Secondary | ICD-10-CM

## 2013-03-06 DIAGNOSIS — K219 Gastro-esophageal reflux disease without esophagitis: Secondary | ICD-10-CM

## 2013-03-06 DIAGNOSIS — Z91013 Allergy to seafood: Secondary | ICD-10-CM

## 2013-03-06 DIAGNOSIS — Z23 Encounter for immunization: Secondary | ICD-10-CM

## 2013-03-06 DIAGNOSIS — R000 Tachycardia, unspecified: Secondary | ICD-10-CM

## 2013-03-06 DIAGNOSIS — E860 Dehydration: Secondary | ICD-10-CM

## 2013-03-06 DIAGNOSIS — Z882 Allergy status to sulfonamides status: Secondary | ICD-10-CM

## 2013-03-06 DIAGNOSIS — R112 Nausea with vomiting, unspecified: Secondary | ICD-10-CM

## 2013-03-06 DIAGNOSIS — E1010 Type 1 diabetes mellitus with ketoacidosis without coma: Principal | ICD-10-CM | POA: Diagnosis present

## 2013-03-06 DIAGNOSIS — Z794 Long term (current) use of insulin: Secondary | ICD-10-CM

## 2013-03-06 DIAGNOSIS — E1110 Type 2 diabetes mellitus with ketoacidosis without coma: Secondary | ICD-10-CM | POA: Diagnosis present

## 2013-03-06 DIAGNOSIS — E109 Type 1 diabetes mellitus without complications: Secondary | ICD-10-CM

## 2013-03-06 LAB — CBC WITH DIFFERENTIAL/PLATELET
HCT: 44.9 % (ref 36.0–46.0)
Hemoglobin: 15.6 g/dL — ABNORMAL HIGH (ref 12.0–15.0)
Lymphocytes Relative: 6 % — ABNORMAL LOW (ref 12–46)
Lymphs Abs: 1.3 10*3/uL (ref 0.7–4.0)
Monocytes Absolute: 1.1 10*3/uL — ABNORMAL HIGH (ref 0.1–1.0)
Monocytes Relative: 6 % (ref 3–12)
Neutro Abs: 17.9 10*3/uL — ABNORMAL HIGH (ref 1.7–7.7)
WBC: 20.3 10*3/uL — ABNORMAL HIGH (ref 4.0–10.5)

## 2013-03-06 LAB — BASIC METABOLIC PANEL
BUN: 46 mg/dL — ABNORMAL HIGH (ref 6–23)
BUN: 32 mg/dL — ABNORMAL HIGH (ref 6–23)
CO2: 13 mEq/L — ABNORMAL LOW (ref 19–32)
CO2: 19 mEq/L (ref 19–32)
Calcium: 10 mg/dL (ref 8.4–10.5)
Calcium: 10 mg/dL (ref 8.4–10.5)
Chloride: 99 mEq/L (ref 96–112)
Creatinine, Ser: 0.92 mg/dL (ref 0.50–1.10)
Creatinine, Ser: 0.72 mg/dL (ref 0.50–1.10)
Creatinine, Ser: 0.71 mg/dL (ref 0.50–1.10)
GFR calc Af Amer: >90 mL/min (ref >90)
GFR calc non Af Amer: >90 mL/min (ref >90)
GFR calc non Af Amer: >90 mL/min (ref >90)
Glucose, Bld: 319 mg/dL — ABNORMAL HIGH (ref 70–99)
Glucose, Bld: 150 mg/dL — ABNORMAL HIGH (ref 70–99)
Glucose, Bld: 187 mg/dL — ABNORMAL HIGH (ref 70–99)
Potassium: 4.5 mEq/L (ref 3.5–5.1)
Sodium: 140 mEq/L (ref 135–145)

## 2013-03-06 LAB — HEPATIC FUNCTION PANEL
AST: 19 U/L (ref 0–37)
Albumin: 5 g/dL (ref 3.5–5.2)
Total Bilirubin: 0.3 mg/dL (ref 0.3–1.2)

## 2013-03-06 LAB — GLUCOSE, CAPILLARY
Glucose-Capillary: 354 mg/dL — ABNORMAL HIGH (ref 70–99)
Glucose-Capillary: 176 mg/dL — ABNORMAL HIGH (ref 70–99)
Glucose-Capillary: 203 mg/dL — ABNORMAL HIGH (ref 70–99)

## 2013-03-06 LAB — PREGNANCY, URINE: Preg Test, Ur: NEGATIVE

## 2013-03-06 LAB — URINALYSIS, ROUTINE W REFLEX MICROSCOPIC
Glucose, UA: 500 mg/dL — AB
Ketones, ur: >80 mg/dL — AB
Leukocytes, UA: NEGATIVE
pH: 6 (ref 5.0–8.0)

## 2013-03-06 LAB — URINE MICROSCOPIC-ADD ON

## 2013-03-06 MED ORDER — SODIUM CHLORIDE 0.9 % IV SOLN: INTRAVENOUS | Status: DC

## 2013-03-06 MED ORDER — POTASSIUM CHLORIDE 10 MEQ/100ML IV SOLN: 10.0000 meq | INTRAVENOUS | Status: AC

## 2013-03-06 MED ORDER — MORPHINE SULFATE 4 MG/ML IJ SOLN
4.0000 mg | Freq: Once | INTRAMUSCULAR | Status: AC
  Administered 2013-03-06: 4 mg via INTRAVENOUS
  Filled 2013-03-06: qty 1

## 2013-03-06 MED ORDER — PROMETHAZINE HCL 12.5 MG PO TABS
25.0000 mg | ORAL_TABLET | Freq: Four times a day (QID) | ORAL | Status: DC | PRN
  Administered 2013-03-06 – 2013-03-07 (×2): 25 mg via ORAL
  Filled 2013-03-06: qty 1
  Filled 2013-03-06: qty 2
  Filled 2013-03-06: qty 1
  Filled 2013-03-06: qty 2

## 2013-03-06 MED ORDER — OXYCODONE-ACETAMINOPHEN 5-325 MG PO TABS
1.0000 | ORAL_TABLET | Freq: Four times a day (QID) | ORAL | Status: DC | PRN
  Administered 2013-03-06 – 2013-03-08 (×3): 1 via ORAL
  Filled 2013-03-06 (×3): qty 1

## 2013-03-06 MED ORDER — SODIUM CHLORIDE 0.9 % IV BOLUS (SEPSIS)
1000.0000 mL | Freq: Once | INTRAVENOUS | Status: AC
  Administered 2013-03-06: 1000 mL via INTRAVENOUS

## 2013-03-06 MED ORDER — CIPROFLOXACIN IN D5W 400 MG/200ML IV SOLN
INTRAVENOUS | Status: AC
  Filled 2013-03-06: qty 400

## 2013-03-06 MED ORDER — CIPROFLOXACIN IN D5W 400 MG/200ML IV SOLN
400.0000 mg | Freq: Two times a day (BID) | INTRAVENOUS | Status: DC
  Administered 2013-03-06 – 2013-03-08 (×4): 400 mg via INTRAVENOUS
  Filled 2013-03-06 (×6): qty 200

## 2013-03-06 MED ORDER — INSULIN GLARGINE 100 UNIT/ML ~~LOC~~ SOLN
45.0000 [IU] | Freq: Every day | SUBCUTANEOUS | Status: DC
  Administered 2013-03-06 – 2013-03-07 (×2): 45 [IU] via SUBCUTANEOUS
  Filled 2013-03-06 (×3): qty 0.45

## 2013-03-06 MED ORDER — HEPARIN SODIUM (PORCINE) 5000 UNIT/ML IJ SOLN
5000.0000 [IU] | Freq: Three times a day (TID) | INTRAMUSCULAR | Status: DC
  Administered 2013-03-06 – 2013-03-08 (×6): 5000 [IU] via SUBCUTANEOUS
  Filled 2013-03-06 (×6): qty 1

## 2013-03-06 MED ORDER — SODIUM CHLORIDE 0.9 % IV SOLN
INTRAVENOUS | Status: DC
  Administered 2013-03-06: 1.3 [IU]/h via INTRAVENOUS
  Filled 2013-03-06: qty 1

## 2013-03-06 MED ORDER — INSULIN ASPART 100 UNIT/ML ~~LOC~~ SOLN
0.0000 [IU] | Freq: Three times a day (TID) | SUBCUTANEOUS | Status: DC
  Administered 2013-03-08 (×2): 2 [IU] via SUBCUTANEOUS

## 2013-03-06 MED ORDER — VITAMIN D (ERGOCALCIFEROL) 1.25 MG (50000 UNIT) PO CAPS
50000.0000 [IU] | ORAL_CAPSULE | ORAL | Status: DC
  Filled 2013-03-06: qty 1

## 2013-03-06 MED ORDER — INSULIN ASPART 100 UNIT/ML ~~LOC~~ SOLN
0.0000 [IU] | Freq: Every day | SUBCUTANEOUS | Status: DC
  Administered 2013-03-06: 2 [IU] via SUBCUTANEOUS
  Administered 2013-03-07: 3 [IU] via SUBCUTANEOUS

## 2013-03-06 MED ORDER — ONDANSETRON HCL 4 MG/2ML IJ SOLN
4.0000 mg | Freq: Once | INTRAMUSCULAR | Status: AC
  Administered 2013-03-06: 4 mg via INTRAVENOUS
  Filled 2013-03-06: qty 2

## 2013-03-06 MED ORDER — SODIUM CHLORIDE 0.9 % IV SOLN
INTRAVENOUS | Status: DC
  Administered 2013-03-06: 15:00:00 via INTRAVENOUS

## 2013-03-06 MED ORDER — OXYCODONE-ACETAMINOPHEN 7.5-325 MG PO TABS: 1.0000 | ORAL_TABLET | Freq: Four times a day (QID) | ORAL | Status: DC | PRN

## 2013-03-06 MED ORDER — DEXTROSE-NACL 5-0.45 % IV SOLN
INTRAVENOUS | Status: DC
  Administered 2013-03-06: 16:00:00 via INTRAVENOUS

## 2013-03-06 MED ORDER — PANTOPRAZOLE SODIUM 40 MG PO TBEC
40.0000 mg | DELAYED_RELEASE_TABLET | Freq: Every day | ORAL | Status: DC
  Administered 2013-03-06 – 2013-03-08 (×3): 40 mg via ORAL
  Filled 2013-03-06 (×3): qty 1

## 2013-03-06 MED ORDER — INSULIN GLARGINE 100 UNIT/ML ~~LOC~~ SOLN
SUBCUTANEOUS | Status: AC
  Filled 2013-03-06: qty 10

## 2013-03-06 MED ORDER — HYOSCYAMINE SULFATE 0.125 MG SL SUBL
0.1250 mg | SUBLINGUAL_TABLET | SUBLINGUAL | Status: DC | PRN
  Administered 2013-03-07: 0.125 mg via SUBLINGUAL
  Filled 2013-03-06: qty 1

## 2013-03-06 MED ORDER — DEXTROSE 50 % IV SOLN: 25.0000 mL | INTRAVENOUS | Status: DC | PRN

## 2013-03-06 MED ORDER — PANCRELIPASE (LIP-PROT-AMYL) 12000-38000 UNITS PO CPEP
1.0000 | ORAL_CAPSULE | Freq: Three times a day (TID) | ORAL | Status: DC
  Administered 2013-03-07 – 2013-03-08 (×5): 1 via ORAL
  Filled 2013-03-06 (×6): qty 1

## 2013-03-06 MED ORDER — OXYCODONE HCL 5 MG PO TABS
5.0000 mg | ORAL_TABLET | Freq: Four times a day (QID) | ORAL | Status: DC | PRN
  Administered 2013-03-06 – 2013-03-08 (×4): 5 mg via ORAL
  Filled 2013-03-06 (×6): qty 1

## 2013-03-06 NOTE — H&P (Signed)
[facility] History and Physical  Linda Walls:[id number] DOB: [birth year] DOA: 03/06/2013  Referring physician: Dr. Adriana Simas, ER PCP: [name], MD  Specialists: Dr. Fransico Him, endocrinology.  Chief Complaint: Left loin pain, nausea and vomiting. Elevated glucose.  HPI: Linda Walls is a 22 y.o. female who has a history of. 3 pancreatitis since the age of 5 and also has been diabetic, insulin-dependent for the last 3 years. She now presents with two-day history of left loin pain associated with nausea and vomiting. Also her blood glucose has been significantly elevated at home. When she presented to the emergency room, she was found to be in DKA. Also her urine is abnormal, indicative of an infection. She is now being admitted for further management.   Review of Systems:  Apart from history of present illness, other systems negative  Past Medical History  Diagnosis Date  . Hereditary pancreatitis   . DM type 1 (diabetes mellitus, type 1)   . Hernia   . GERD (gastroesophageal reflux disease)   . Jaundice    Past Surgical History  Procedure Laterality Date  . Hernia repair      As infant.   Social History:  reports that she has never smoked. She has never used smokeless tobacco. She reports that she does not drink alcohol or use illicit drugs.   Allergies  Allergen Reactions  . Cefzil [Cefprozil]     Sickness  . Shellfish Allergy Nausea And Vomiting  . Sulfa Antibiotics Rash    Family History  Problem Relation Age of Onset  . Pancreatitis Father 30    hereditary  . Hypertension Father   . GER disease Father       Prior to Admission medications   Medication Sig Start Date End Date Taking? Authorizing Provider  hyoscyamine (LEVSIN SL) 0.125 MG SL tablet Place 0.125 mg under the tongue every 4 (four) hours as needed for cramping or diarrhea or loose stools.   Yes Historical Provider, MD  ibuprofen (ADVIL,MOTRIN) 200 MG tablet Take 400 mg by mouth every 6 (six)  hours as needed. pain   Yes Historical Provider, MD  insulin aspart (NOVOLOG) 100 UNIT/ML injection Inject 15 Units into the skin 3 (three) times daily before meals. *Increase subcutaneous injection by 1 unit for every 50 units over 150.* 05/02/12  Yes Elliot Cousin, MD  insulin glargine (LANTUS) 100 UNIT/ML injection Inject 45 Units into the skin every evening.    Yes Historical Provider, MD  Multiple Vitamins-Minerals (MULTIVITAMINS THER. W/MINERALS) TABS Take 1 tablet by mouth daily. For energy   Yes Historical Provider, MD  omeprazole (PRILOSEC) 20 MG capsule Take 20 mg by mouth daily.   Yes Historical Provider, MD  oxyCODONE-acetaminophen (PERCOCET) 7.5-325 MG per tablet Take 1 tablet by mouth every 6 (six) hours as needed. For pain 02/17/13  Yes Merlyn Albert, MD  Pancrelipase, Lip-Prot-Amyl, 6000 UNITS CPEP Take 2 capsules (12,000 Units total) by mouth 3 (three) times daily before meals. 10/26/12  Yes Merlyn Albert, MD  promethazine (PHENERGAN) 25 MG tablet Take 1 tablet (25 mg total) by mouth every 6 (six) hours as needed for nausea. 12/25/12  Yes Erick Blinks, MD  Vitamin D, Ergocalciferol, (DRISDOL) 50000 UNITS CAPS Take 50,000 Units by mouth every 7 (seven) days. Takes on Wednesdays. 09/15/12  Yes Historical Provider, MD   Physical Exam: Filed Vitals:   03/06/13 1500  BP: 134/85  Pulse:   Temp:   Resp: 16     General:  She looks somewhat clinically dehydrated. She is not toxic or septic.  Eyes: No pallor. No jaundice.  ENT: No abnormalities.  Neck: No neck lymphadenopathy.  Cardiovascular: Heart sounds are present with a sinus tachycardia. No murmurs.  Respiratory: Lung fields are clear.  Abdomen: Left loin tenderness.  Skin: No rash.  Musculoskeletal: No acute joint abnormalities.  Psychiatric: Appropriate affect.  Neurologic: Alert and orientated without any focal neurological signs.  Labs on Admission:  Basic Metabolic Panel:  Recent Labs Lab  03/06/13 1250  NA 132*  K 4.7  CL 99  CO2 13*  GLUCOSE 319*  BUN 46*  CREATININE 0.92  CALCIUM 11.2*   Liver Function Tests:  Recent Labs Lab 03/06/13 1250  AST 19  ALT 15  ALKPHOS 116  BILITOT 0.3  PROT 9.1*  ALBUMIN 5.0    Recent Labs Lab 03/06/13 1250  LIPASE 11    CBC:  Recent Labs Lab 03/06/13 1250  WBC 20.3*  NEUTROABS 17.9*  HGB 15.6*  HCT 44.9  MCV 88.9  PLT 420*     CBG:  Recent Labs Lab 03/06/13 1113 03/06/13 1502  GLUCAP 354* 192*    Radiological Exams on Admission: No results found.    Assessment/Plan   1. Mild diabetic ketoacidosis. 2. Left pyelonephritis clinically. This is probably the triggering factor for #1. 3. History of pancreatitis. No evidence of acute pancreatitis on this admission.  Plan: 1. Admit to step down unit. 2. Intravenous fluids. 3. Intravenous insulin per protocol. 4. Urine culture and then start intravenous ciprofloxacin.  Further recommendations will depend on patient's hospital progress.   Code Status: Full code.   Family Communication: Discussed plan with patient and patient's family at the bedside.   Disposition Plan: Home when medically stable.  Time spent: 45 minutes.  Wilson Singer [facility] Pager [phone].  If 7PM-7AM, please contact night-coverage [email or web address] Password [email or web address] 03/06/2013, 3:30 PM

## 2013-03-06 NOTE — ED Notes (Signed)
Dr. Gosarani at bedside. 

## 2013-03-06 NOTE — ED Provider Notes (Addendum)
CSN: [id number]     Arrival date & time 03/06/13  1043 History   This chart was scribed for Donnetta Hutching, MD, by Yevette Edwards, ED Scribe. This patient was seen in room APA12/APA12 and the patient's care was started at 11:47 AM. First MD Initiated Contact with Patient 03/06/13 1132     Chief Complaint  Patient presents with  . Emesis  . Hyperglycemia   (Consider location/radiation/quality/duration/timing/severity/associated sxs/prior Treatment) HPI HPI Comments: Linda Walls is a 22 y.o. female, with hereditary pancreatitis which has led to type I DM, who presents to the Emergency Department complaining of gradually increasing LUQ pain with associated emesis. Yesterday, she had cbgs which were intermittently high, but today her glucose levels have stayed high; in the ED her glucose was 354. She reports that she has not been able to eat or drink since the suspected pancreatitis began. The pt denies a fever or diarrhea. At baseline, her lipase levels are intermittently elevated.   Dr. Loleta Chance at [facility] pediatrics.  Dr. Janett Billow is her endocrinologics.   Past Medical History  Diagnosis Date  . Hereditary pancreatitis   . DM type 1 (diabetes mellitus, type 1)   . Hernia   . GERD (gastroesophageal reflux disease)   . Jaundice    Past Surgical History  Procedure Laterality Date  . Hernia repair      As infant.   Family History  Problem Relation Age of Onset  . Pancreatitis Father 30    hereditary  . Hypertension Father   . GER disease Father    History  Substance Use Topics  . Smoking status: Never Smoker   . Smokeless tobacco: Never Used  . Alcohol Use: No   No OB history provided.  Review of Systems  Constitutional: Negative for fever.  Gastrointestinal: Positive for nausea, abdominal pain and diarrhea.  All other systems reviewed and are negative.    Allergies  Cefzil; Shellfish allergy; and Sulfa antibiotics  Home Medications   Current Outpatient Rx  Name  Route   Sig  Dispense  Refill  . hyoscyamine (LEVSIN SL) 0.125 MG SL tablet   Sublingual   Place 0.125 mg under the tongue every 4 (four) hours as needed for cramping or diarrhea or loose stools.         Marland Kitchen ibuprofen (ADVIL,MOTRIN) 200 MG tablet   Oral   Take 400 mg by mouth every 6 (six) hours as needed. pain         . insulin aspart (NOVOLOG) 100 UNIT/ML injection   Subcutaneous   Inject 15 Units into the skin 3 (three) times daily before meals. *Increase subcutaneous injection by 1 unit for every 50 units over 150.*         . insulin glargine (LANTUS) 100 UNIT/ML injection   Subcutaneous   Inject 45 Units into the skin every evening.          . Multiple Vitamins-Minerals (MULTIVITAMINS THER. W/MINERALS) TABS   Oral   Take 1 tablet by mouth daily. For energy         . omeprazole (PRILOSEC) 20 MG capsule   Oral   Take 20 mg by mouth daily.         Marland Kitchen oxyCODONE-acetaminophen (PERCOCET) 7.5-325 MG per tablet   Oral   Take 1 tablet by mouth every 6 (six) hours as needed. For pain   30 tablet   0     Needs office visit before further refills.   . Pancrelipase, Lip-Prot-Amyl, 6000  UNITS CPEP   Oral   Take 2 capsules (12,000 Units total) by mouth 3 (three) times daily before meals.   180 capsule   6     May also had one before large snacks   . promethazine (PHENERGAN) 25 MG tablet   Oral   Take 1 tablet (25 mg total) by mouth every 6 (six) hours as needed for nausea.   30 tablet   0   . Vitamin D, Ergocalciferol, (DRISDOL) 50000 UNITS CAPS   Oral   Take 50,000 Units by mouth every 7 (seven) days. Takes on Wednesdays.          Triage Vitals: BP 131/89  Pulse 128  Temp(Src) 98.7 F (37.1 C) (Oral)  Resp 20  Ht 5' 2.5" (1.588 m)  Wt 115 lb (52.164 kg)  BMI 20.69 kg/m2  SpO2 98%  LMP 02/27/2013  Physical Exam  Nursing note and vitals reviewed. Constitutional: She is oriented to person, place, and time. She appears well-developed and well-nourished.  HENT:   Head: Normocephalic and atraumatic.  Eyes: Conjunctivae and EOM are normal. Pupils are equal, round, and reactive to light.  Neck: Normal range of motion. Neck supple.  Cardiovascular: Normal rate, regular rhythm and normal heart sounds.   Pulmonary/Chest: Effort normal and breath sounds normal.  Abdominal: Soft. Bowel sounds are normal. There is tenderness.  LUQ tenderness.  Musculoskeletal: Normal range of motion.  Neurological: She is alert and oriented to person, place, and time.  Skin: Skin is warm and dry.  Psychiatric: She has a normal mood and affect.    ED Course  Procedures (including critical care time)  DIAGNOSTIC STUDIES: Oxygen Saturation is 98% on room air, normal by my interpretation.    COORDINATION OF CARE:  11:52 AM- Discussed treatment plan with patient which includes fluids and possible admission, and the patient agreed to the plan.   Labs Review Labs Reviewed  CBC WITH DIFFERENTIAL - Abnormal; Notable for the following:    WBC 20.3 (*)    Hemoglobin 15.6 (*)    Platelets 420 (*)    Neutrophils Relative % 88 (*)    Neutro Abs 17.9 (*)    Lymphocytes Relative 6 (*)    Monocytes Absolute 1.1 (*)    All other components within normal limits  BASIC METABOLIC PANEL - Abnormal; Notable for the following:    Sodium 132 (*)    CO2 13 (*)    Glucose, Bld 319 (*)    BUN 46 (*)    Calcium 11.2 (*)    GFR calc non Af Amer 90 (*)    All other components within normal limits  GLUCOSE, CAPILLARY - Abnormal; Notable for the following:    Glucose-Capillary 354 (*)    All other components within normal limits  HEPATIC FUNCTION PANEL - Abnormal; Notable for the following:    Total Protein 9.1 (*)    Indirect Bilirubin 0.2 (*)    All other components within normal limits  URINALYSIS, ROUTINE W REFLEX MICROSCOPIC - Abnormal; Notable for the following:    APPearance HAZY (*)    Specific Gravity, Urine >1.030 (*)    Glucose, UA 500 (*)    Hgb urine dipstick SMALL  (*)    Bilirubin Urine MODERATE (*)    Ketones, ur >80 (*)    Protein, ur 100 (*)    All other components within normal limits  URINE MICROSCOPIC-ADD ON - Abnormal; Notable for the following:    Squamous Epithelial / LPF  FEW (*)    Bacteria, UA MANY (*)    Casts HYALINE CASTS (*)    All other components within normal limits  URINE CULTURE  LIPASE, BLOOD  PREGNANCY, URINE   Imaging Review No results found. CRITICAL CARE Performed by: Donnetta Hutching Total critical care time:30 Critical care time was exclusive of separately billable procedures and treating other patients. Critical care was necessary to treat or prevent imminent or life-threatening deterioration. Critical care was time spent personally by me on the following activities: development of treatment plan with patient and/or surrogate as well as nursing, discussions with consultants, evaluation of patient's response to treatment, examination of patient, obtaining history from patient or surrogate, ordering and performing treatments and interventions, ordering and review of laboratory studies, ordering and review of radiographic studies, pulse oximetry and re-evaluation of patient's condition. MDM  No diagnosis found.  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 She is dehydrated . Glucose stabilizer initiated.  Admit to general medicine.  I personally performed the services described in this documentation, which was scribed in my presence. The recorded information has been reviewed and is accurate.       Donnetta Hutching, MD 03/06/13 1451  Donnetta Hutching, MD 03/06/13 1452

## 2013-03-06 NOTE — Progress Notes (Signed)
Dr. Karilyn Cota informed about patient labs. Potassium orders were cancelled.

## 2013-03-06 NOTE — ED Notes (Signed)
Report given to ICU nurse.

## 2013-03-06 NOTE — ED Provider Notes (Deleted)
CSN: [id number]     Arrival date & time 03/06/13  1043 History   First MD Initiated Contact with Patient 03/06/13 1132     Chief Complaint  Patient presents with  . Emesis  . Hyperglycemia   (Consider location/radiation/quality/duration/timing/severity/associated sxs/prior Treatment) HPI  Past Medical History  Diagnosis Date  . Hereditary pancreatitis   . DM type 1 (diabetes mellitus, type 1)   . Hernia   . GERD (gastroesophageal reflux disease)   . Jaundice    Past Surgical History  Procedure Laterality Date  . Hernia repair      As infant.   Family History  Problem Relation Age of Onset  . Pancreatitis Father 30    hereditary  . Hypertension Father   . GER disease Father    History  Substance Use Topics  . Smoking status: Never Smoker   . Smokeless tobacco: Never Used  . Alcohol Use: No   OB History   Grav Para Term Preterm Abortions TAB SAB Ect Mult Living                 Review of Systems  Allergies  Cefzil; Shellfish allergy; and Sulfa antibiotics  Home Medications   Current Outpatient Rx  Name  Route  Sig  Dispense  Refill  . hyoscyamine (LEVSIN SL) 0.125 MG SL tablet   Sublingual   Place 0.125 mg under the tongue every 4 (four) hours as needed for cramping or diarrhea or loose stools.         Marland Kitchen ibuprofen (ADVIL,MOTRIN) 200 MG tablet   Oral   Take 400 mg by mouth every 6 (six) hours as needed. pain         . insulin aspart (NOVOLOG) 100 UNIT/ML injection   Subcutaneous   Inject 15 Units into the skin 3 (three) times daily before meals. *Increase subcutaneous injection by 1 unit for every 50 units over 150.*         . insulin glargine (LANTUS) 100 UNIT/ML injection   Subcutaneous   Inject 45 Units into the skin every evening.          . Multiple Vitamins-Minerals (MULTIVITAMINS THER. W/MINERALS) TABS   Oral   Take 1 tablet by mouth daily. For energy         . omeprazole (PRILOSEC) 20 MG capsule   Oral   Take 20 mg by mouth  daily.         Marland Kitchen oxyCODONE-acetaminophen (PERCOCET) 7.5-325 MG per tablet   Oral   Take 1 tablet by mouth every 6 (six) hours as needed. For pain   30 tablet   0     Needs office visit before further refills.   . Pancrelipase, Lip-Prot-Amyl, 6000 UNITS CPEP   Oral   Take 2 capsules (12,000 Units total) by mouth 3 (three) times daily before meals.   180 capsule   6     May also had one before large snacks   . promethazine (PHENERGAN) 25 MG tablet   Oral   Take 1 tablet (25 mg total) by mouth every 6 (six) hours as needed for nausea.   30 tablet   0   . Vitamin D, Ergocalciferol, (DRISDOL) 50000 UNITS CAPS   Oral   Take 50,000 Units by mouth every 7 (seven) days. Takes on Wednesdays.          BP 120/87  Pulse 112  Temp(Src) 98.7 F (37.1 C) (Oral)  Resp 18  Ht 5' 2.5" (1.588 m)  Wt 115 lb (52.164 kg)  BMI 20.69 kg/m2  SpO2 100%  LMP 02/27/2013 Physical Exam  ED Course  Procedures (including critical care time) Labs Review Labs Reviewed  CBC WITH DIFFERENTIAL - Abnormal; Notable for the following:    WBC 20.3 (*)    Hemoglobin 15.6 (*)    Platelets 420 (*)    Neutrophils Relative % 88 (*)    Neutro Abs 17.9 (*)    Lymphocytes Relative 6 (*)    Monocytes Absolute 1.1 (*)    All other components within normal limits  BASIC METABOLIC PANEL - Abnormal; Notable for the following:    Sodium 132 (*)    CO2 13 (*)    Glucose, Bld 319 (*)    BUN 46 (*)    Calcium 11.2 (*)    GFR calc non Af Amer 90 (*)    All other components within normal limits  GLUCOSE, CAPILLARY - Abnormal; Notable for the following:    Glucose-Capillary 354 (*)    All other components within normal limits  HEPATIC FUNCTION PANEL - Abnormal; Notable for the following:    Total Protein 9.1 (*)    Indirect Bilirubin 0.2 (*)    All other components within normal limits  URINALYSIS, ROUTINE W REFLEX MICROSCOPIC - Abnormal; Notable for the following:    APPearance HAZY (*)    Specific  Gravity, Urine >1.030 (*)    Glucose, UA 500 (*)    Hgb urine dipstick SMALL (*)    Bilirubin Urine MODERATE (*)    Ketones, ur >80 (*)    Protein, ur 100 (*)    All other components within normal limits  URINE MICROSCOPIC-ADD ON - Abnormal; Notable for the following:    Squamous Epithelial / LPF FEW (*)    Bacteria, UA MANY (*)    Casts HYALINE CASTS (*)    All other components within normal limits  URINE CULTURE  LIPASE, BLOOD  PREGNANCY, URINE   Imaging Review No results found. CRITICAL CARE Performed by: Donnetta Hutching Total critical care time: 30 Critical care time was exclusive of separately billable procedures and treating other patients. Critical care was necessary to treat or prevent imminent or life-threatening deterioration. Critical care was time spent personally by me on the following activities: development of treatment plan with patient and/or surrogate as well as nursing, discussions with consultants, evaluation of patient's response to treatment, examination of patient, obtaining history from patient or surrogate, ordering and performing treatments and interventions, ordering and review of laboratory studies, ordering and review of radiographic studies, pulse oximetry and re-evaluation of patient's condition. MDM   1. DKA (diabetic ketoacidoses)     Patient has idiopathic pancreatitis with associated diabetes.   She is dehydrated and in mild DKA.   Vigorous IV hydration.  Admit to general medicine.    Donnetta Hutching, MD 03/06/13 1447

## 2013-03-06 NOTE — ED Notes (Signed)
Pt has been vomiting since Friday and her bs has been high, 472 this am,no fever or diarrhea.

## 2013-03-07 DIAGNOSIS — K859 Acute pancreatitis without necrosis or infection, unspecified: Secondary | ICD-10-CM

## 2013-03-07 LAB — GLUCOSE, CAPILLARY
Glucose-Capillary: 47 mg/dL — ABNORMAL LOW (ref 70–99)
Glucose-Capillary: 80 mg/dL (ref 70–99)
Glucose-Capillary: 284 mg/dL — ABNORMAL HIGH (ref 70–99)

## 2013-03-07 LAB — URINE CULTURE: Colony Count: 15000

## 2013-03-07 MED ORDER — SODIUM CHLORIDE 0.9 % IV SOLN
INTRAVENOUS | Status: DC
  Administered 2013-03-07 – 2013-03-08 (×2): via INTRAVENOUS

## 2013-03-07 MED ORDER — MORPHINE SULFATE 2 MG/ML IJ SOLN
2.0000 mg | INTRAMUSCULAR | Status: DC | PRN
  Administered 2013-03-07 – 2013-03-08 (×6): 2 mg via INTRAVENOUS
  Filled 2013-03-07 (×6): qty 1

## 2013-03-07 MED ORDER — PROMETHAZINE HCL 25 MG/ML IJ SOLN
12.5000 mg | Freq: Four times a day (QID) | INTRAMUSCULAR | Status: DC | PRN
  Administered 2013-03-07 – 2013-03-08 (×5): 12.5 mg via INTRAVENOUS
  Filled 2013-03-07 (×5): qty 1

## 2013-03-07 MED ORDER — DIPHENHYDRAMINE HCL 25 MG PO CAPS
50.0000 mg | ORAL_CAPSULE | Freq: Every evening | ORAL | Status: DC | PRN
  Administered 2013-03-07: 50 mg via ORAL
  Filled 2013-03-07: qty 2

## 2013-03-07 NOTE — Progress Notes (Signed)
Spoke with pt about diabetes and home regimen for diabetes control.  Patient reports that she has had diabetes for 3 years and has been seeing Dr. Fransico Him for the last year for diabetes management.  According to the patient her last A1C was in the 8% range and she is working with Dr. Fransico Him to decrease her A1C to 7% or less so she can get an insulin pump in the future.  Patient states that she had a "GI bug" over the weekend and started vomiting Friday.  From that point on her blood sugars remained over 400 mg/dl. She reports that she did continue to take her basal insulin and her correction during her illness despite not being able to eat.  Of note, patient also has a kidney infection which could have contributing to high blood sugars.  According to the patient and her father, her blood sugar is fairly controlled as an outpatient.  She did experience a low blood glucose of 47 mg/dl this morning at 1:61 and she was able to recognize she was having a low and notified nursing staff for treatment of hypoglycemia.  When asked about low blood sugars at home, patient states that she does not have low blood sugars at home very often.  Patient and her father are knowledgeable about diabetes and the patient is working to decrease her A1C in hopes of going on an insulin pump in the future.   Patient and her father verbalize understanding of information discussed and state they have no further questions at this time. Will continue to follow as an inpatient.  Thanks, Orlando Penner, RN, MSN, CCRN Diabetes Coordinator [facility]patient [facility] [phone]

## 2013-03-07 NOTE — Progress Notes (Signed)
  Linda Walls AVW:[id number] DOB: 01-09-1991 DOA: 03/06/2013 PCP: [name], MD   Subjective: This lady feels better. She still has some left loin pain and nausea but is able to tolerate a diet. Her anion gap has closed and she is now on her usual daily insulin subcutaneously and on a carbohydrate modified diet.           Physical Exam: Blood pressure 95/57, pulse 96, temperature 98.6 F (37 C), temperature source Oral, resp. rate 19, height 5\' 2"  (1.575 m), weight 47 kg (103 lb 9.9 oz), last menstrual period 02/27/2013, SpO2 100.00%. She looks much improved. She still has some left loin tenderness but less so than yesterday. Lung fields are clear. She is alert and orientated. Abdomen is otherwise soft and nontender.   Investigations:  Recent Results (from the past 240 hour(s))  MRSA PCR SCREENING     Status: None   Collection Time    03/06/13  4:20 PM      Result Value Range Status   MRSA by PCR NEGATIVE  NEGATIVE Final   Comment:            The GeneXpert MRSA Assay (FDA     approved for NASAL specimens     only), is one component of a     comprehensive MRSA colonization     surveillance program. It is not     intended to diagnose MRSA     infection nor to guide or     monitor treatment for     MRSA infections.     Basic Metabolic Panel:  Recent Labs  82/95/62 1625 03/06/13 1811  NA 140 140  K 4.6 4.5  CL 110 109  CO2 19 20  GLUCOSE 150* 187*  BUN 36* 32*  CREATININE 0.72 0.71  CALCIUM 10.0 10.0   Liver Function Tests:  Recent Labs  03/06/13 1250  AST 19  ALT 15  ALKPHOS 116  BILITOT 0.3  PROT 9.1*  ALBUMIN 5.0     CBC:  Recent Labs  03/06/13 1250  WBC 20.3*  NEUTROABS 17.9*  HGB 15.6*  HCT 44.9  MCV 88.9  PLT 420*    No results found.    Medications: I have reviewed the patient's current medications.  Impression: 1. DKA, resolved. 2. Clinical left pyelonephritis, on intravenous ciprofloxacin. 3. Hereditary  pancreatitis, clinically not an issue today.     Plan: 1. Continue with low-dose IV fluids. 2. Continue with home insulin regimen. 3. Continue with intravenous ciprofloxacin. 4. Move to the medical floor.  Consultants:  None.   Procedures:  None.   Antibiotics:  Intravenous ciprofloxacin started 03/06/2013.                   Code Status: Full code.  Family Communication: Discussed plan with patient at the bedside.   Disposition Plan: Home when medically stable. Probably tomorrow.  Time spent: 15 minutes.   LOS: 1 day   Wilson Singer Pager [phone]  03/07/2013, 8:33 AM

## 2013-03-07 NOTE — Progress Notes (Signed)
cbg 47. Pt arouses without difficulty, alert and oriented,  skin warm and dry. Hypoglycemic protocol initiated. Pt given soda and snack. Continue to monitor.

## 2013-03-07 NOTE — Progress Notes (Signed)
UR Chart Review Completed  

## 2013-03-07 NOTE — Progress Notes (Signed)
Utilization Review Complete  

## 2013-03-07 NOTE — Progress Notes (Signed)
Report called and given to Deberah Castle, RN. Patient being transferred to dept 300 room 323. Patient alert, oriented and in stable condition at the time of transfer. Patient's father present at time of transport. Patient's belongings sent with her too dept 300.

## 2013-03-08 LAB — CBC
HCT: 33.4 % — ABNORMAL LOW (ref 36.0–46.0)
Hemoglobin: 11.4 g/dL — ABNORMAL LOW (ref 12.0–15.0)
MCH: 30.5 pg (ref 26.0–34.0)
RBC: 3.74 MIL/uL — ABNORMAL LOW (ref 3.87–5.11)

## 2013-03-08 LAB — COMPREHENSIVE METABOLIC PANEL
ALT: 11 U/L (ref 0–35)
Alkaline Phosphatase: 69 U/L (ref 39–117)
BUN: 9 mg/dL (ref 6–23)
CO2: 23 mEq/L (ref 19–32)
Calcium: 9 mg/dL (ref 8.4–10.5)
GFR calc Af Amer: >90 mL/min (ref >90)
GFR calc non Af Amer: >90 mL/min (ref >90)
Glucose, Bld: 66 mg/dL — ABNORMAL LOW (ref 70–99)
Potassium: 2.9 mEq/L — ABNORMAL LOW (ref 3.5–5.1)
Sodium: 142 mEq/L (ref 135–145)

## 2013-03-08 LAB — GLUCOSE, CAPILLARY
Glucose-Capillary: 149 mg/dL — ABNORMAL HIGH (ref 70–99)
Glucose-Capillary: 125 mg/dL — ABNORMAL HIGH (ref 70–99)

## 2013-03-08 MED ORDER — POTASSIUM CHLORIDE CRYS ER 20 MEQ PO TBCR
40.0000 meq | EXTENDED_RELEASE_TABLET | Freq: Once | ORAL | Status: AC
  Administered 2013-03-08: 40 meq via ORAL
  Filled 2013-03-08: qty 2

## 2013-03-08 MED ORDER — INFLUENZA VAC SPLIT QUAD 0.5 ML IM SUSP
0.5000 mL | Freq: Once | INTRAMUSCULAR | Status: AC
Start: 2013-03-08 — End: 2013-03-08
  Administered 2013-03-08: 0.5 mL via INTRAMUSCULAR
  Filled 2013-03-08: qty 0.5

## 2013-03-08 MED ORDER — CIPROFLOXACIN HCL 500 MG PO TABS: 500.0000 mg | ORAL_TABLET | Freq: Two times a day (BID) | ORAL | Status: DC

## 2013-03-08 NOTE — Discharge Summary (Signed)
Physician Discharge Summary  Linda Walls ZOX:[id number] DOB: 1990/09/23 DOA: 03/06/2013  PCP: [name], MD  Admit date: 03/06/2013 Discharge date: 03/08/2013  Time spent: Less than 30 minutes  Recommendations for Outpatient Follow-up:  1. Followup with endocrinologist as previously planned.  2. Follow up primary care physician in approximately one week.  Discharge Diagnoses:  1. Diabetic ketoacidosis, resolved. 2. Left pyelonephritis clinically. 3. Hereditary pancreatitis, no evidence of acute pancreatitis on this admission.   Discharge Condition: Stable and improved.  Diet recommendation: Carbohydrate modified diet.  Filed Weights   03/06/13 1600 03/07/13 0500 03/07/13 1515  Weight: 46.8 kg (103 lb 2.8 oz) 47 kg (103 lb 9.9 oz) 48 kg (105 lb 13.1 oz)    History of present illness:   this very pleasant 21-symptoms ofyear-old lady was admitted with left loin pain, nausea vomiting and elevated glucose. Please see initial history as outlined below,  HPI: Linda Walls is a 22 y.o. female who has a history of. 3 pancreatitis since the age of 5 and also has been diabetic, insulin-dependent for the last 3 years. She now presents with two-day history of left loin pain associated with nausea and vomiting. Also her blood glucose has been significantly elevated at home. When she presented to the emergency room, she was found to be in DKA. Also her urine is abnormal, indicative of an infection. She is now being admitted for further management.  Hospital Course:   patient was treated with intravenous fluids, intravenous insulin and intravenous antibiotics for her presumed pyelonephritis. Urine culture actually was unremarkable but I think she clinically had this and she has improved. Her diabetic ketoacidosis resolved fairly quickly. She has a slight nausea now but the pain is improved and she is stable to go home. She will followup with her primary care doctor and endocrinologist as  previously planned.  Procedures:   none.   Consultations:   none.  Discharge Exam: Filed Vitals:   03/08/13 1000  BP: 119/80  Pulse: 105  Temp: 98.1 F (36.7 C)  Resp: 18    General:  she looks systemically well. She is not toxic or septic.  Cardiovascular:  heart sounds are present and normal without murmurs.  Respiratory:  lung fields are clear. She is alert and orientated. She has minimal left loin tenderness now.   Discharge Instructions  Discharge Orders   Future Orders Complete By Expires   Diet - low sodium heart healthy  As directed    Increase activity slowly  As directed        Medication List         ciprofloxacin 500 MG tablet  Commonly known as:  CIPRO  Take 1 tablet (500 mg total) by mouth 2 (two) times daily.     hyoscyamine 0.125 MG SL tablet  Commonly known as:  LEVSIN SL  Place 0.125 mg under the tongue every 4 (four) hours as needed for cramping or diarrhea or loose stools.     ibuprofen 200 MG tablet  Commonly known as:  ADVIL,MOTRIN  Take 400 mg by mouth every 6 (six) hours as needed. pain     insulin aspart 100 UNIT/ML injection  Commonly known as:  novoLOG  Inject 15 Units into the skin 3 (three) times daily before meals. *Increase subcutaneous injection by 1 unit for every 50 units over 150.*     insulin glargine 100 UNIT/ML injection  Commonly known as:  LANTUS  Inject 45 Units into the skin every evening.  multivitamins ther. w/minerals Tabs tablet  Take 1 tablet by mouth daily. For energy     omeprazole 20 MG capsule  Commonly known as:  PRILOSEC  Take 20 mg by mouth daily.     oxyCODONE-acetaminophen 7.5-325 MG per tablet  Commonly known as:  PERCOCET  Take 1 tablet by mouth every 6 (six) hours as needed. For pain     Pancrelipase (Lip-Prot-Amyl) 6000 UNITS Cpep  Take 2 capsules (12,000 Units total) by mouth 3 (three) times daily before meals.     promethazine 25 MG tablet  Commonly known as:  PHENERGAN  Take 1  tablet (25 mg total) by mouth every 6 (six) hours as needed for nausea.     Vitamin D (Ergocalciferol) 50000 UNITS Caps capsule  Commonly known as:  DRISDOL  Take 50,000 Units by mouth every 7 (seven) days. Takes on Wednesdays.       Allergies  Allergen Reactions  . Cefzil [Cefprozil]     Sickness  . Shellfish Allergy Nausea And Vomiting  . Sulfa Antibiotics Rash      The results of significant diagnostics from this hospitalization (including imaging, microbiology, ancillary and laboratory) are listed below for reference.    Significant Diagnostic Studies: No results found.  Microbiology: Recent Results (from the past 240 hour(s))  URINE CULTURE     Status: None   Collection Time    03/06/13  1:35 PM      Result Value Range Status   Specimen Description URINE, CLEAN CATCH   Final   Special Requests NONE   Final   Culture  Setup Time     Final   Value: 03/06/2013 21:17     Performed at [facility] Count     Final   Value: 15,000 COLONIES/ML     Performed at [facility]   Culture     Final   Value: Multiple bacterial morphotypes present, none predominant. Suggest appropriate recollection if clinically indicated.     Performed at [facility]   Report Status 03/07/2013 FINAL   Final  MRSA PCR SCREENING     Status: None   Collection Time    03/06/13  4:20 PM      Result Value Range Status   MRSA by PCR NEGATIVE  NEGATIVE Final   Comment:            The GeneXpert MRSA Assay (FDA     approved for NASAL specimens     only), is one component of a     comprehensive MRSA colonization     surveillance program. It is not     intended to diagnose MRSA     infection nor to guide or     monitor treatment for     MRSA infections.     Labs: Basic Metabolic Panel:  Recent Labs Lab 03/06/13 1250 03/06/13 1625 03/06/13 1811 03/08/13 0541  NA 132* 140 140 142  K 4.7 4.6 4.5 2.9*  CL 99 110 109 110  CO2 13* 19 20 23   GLUCOSE 319*  150* 187* 66*  BUN 46* 36* 32* 9  CREATININE 0.92 0.72 0.71 0.45*  CALCIUM 11.2* 10.0 10.0 9.0   Liver Function Tests:  Recent Labs Lab 03/06/13 1250 03/08/13 0541  AST 19 16  ALT 15 11  ALKPHOS 116 69  BILITOT 0.3 0.2*  PROT 9.1* 6.0  ALBUMIN 5.0 3.2*    Recent Labs Lab 03/06/13 1250  LIPASE 11  CBC:  Recent Labs Lab 03/06/13 1250 03/08/13 0541  WBC 20.3* 6.9  NEUTROABS 17.9*  --   HGB 15.6* 11.4*  HCT 44.9 33.4*  MCV 88.9 89.3  PLT 420* 250     CBG:  Recent Labs Lab 03/07/13 0810 03/07/13 1131 03/07/13 1647 03/07/13 2158 03/08/13 0724  GLUCAP 76 85 90 284* 149*       Signed:  GOSRANI,NIMISH C  [facility] 03/08/2013, 11:17 AM

## 2013-03-08 NOTE — Progress Notes (Signed)
03/08/13 0750 patient noted to have K+ level 2.9 this morning, text-paged Dr. Karilyn Cota to notify. Earnstine Regal, RN

## 2013-03-08 NOTE — Plan of Care (Signed)
Problem: Phase II Progression Outcomes Goal: Potassium level normalizing Outcome: Adequate for Discharge Patient received potassium chloride supplement this morning per orders, see MAR. Earnstine Regal, RN

## 2013-03-08 NOTE — Progress Notes (Signed)
03/08/13 1305 Reviewed discharge instructions with patient. Copy of instructions, medication list, prescription, f/u information given. Patient and her dad verbalize understanding of instructions. Stated they prefer to schedule f/u appointment. IV site to be d/c'd when cipro IV completed. Pt in stable condition awaiting mother arrival for discharge home. Earnstine Regal, RN

## 2013-03-08 NOTE — Progress Notes (Signed)
03/08/13 1545 Patient met criteria for influenza vaccine per protocol, pt requested to receive prior to discharge. Tolerated well, see MAR. IV site d/c'd and within normal limits this afternoon. Pt left floor in stable condition via w/c accompanied by staff member. Earnstine Regal, RN

## 2013-03-30 ENCOUNTER — Encounter (HOSPITAL_COMMUNITY): Payer: Self-pay

## 2013-03-30 ENCOUNTER — Emergency Department (HOSPITAL_COMMUNITY): Payer: BC Managed Care – PPO

## 2013-03-30 ENCOUNTER — Inpatient Hospital Stay (HOSPITAL_COMMUNITY)
Admission: EM | Admit: 2013-03-30 | Discharge: 2013-04-02 | DRG: 295 | Disposition: A | Discharge status: Home / Self Care | Payer: BC Managed Care – PPO | Attending: Internal Medicine | Admitting: Internal Medicine

## 2013-03-30 DIAGNOSIS — Z794 Long term (current) use of insulin: Secondary | ICD-10-CM

## 2013-03-30 DIAGNOSIS — K8020 Calculus of gallbladder without cholecystitis without obstruction: Secondary | ICD-10-CM | POA: Diagnosis present

## 2013-03-30 DIAGNOSIS — D72829 Elevated white blood cell count, unspecified: Secondary | ICD-10-CM | POA: Diagnosis present

## 2013-03-30 DIAGNOSIS — K219 Gastro-esophageal reflux disease without esophagitis: Secondary | ICD-10-CM | POA: Diagnosis present

## 2013-03-30 DIAGNOSIS — E109 Type 1 diabetes mellitus without complications: Secondary | ICD-10-CM

## 2013-03-30 DIAGNOSIS — R109 Unspecified abdominal pain: Secondary | ICD-10-CM

## 2013-03-30 DIAGNOSIS — E1010 Type 1 diabetes mellitus with ketoacidosis without coma: Principal | ICD-10-CM | POA: Diagnosis present

## 2013-03-30 DIAGNOSIS — Z79899 Other long term (current) drug therapy: Secondary | ICD-10-CM

## 2013-03-30 DIAGNOSIS — K861 Other chronic pancreatitis: Secondary | ICD-10-CM | POA: Diagnosis present

## 2013-03-30 DIAGNOSIS — R112 Nausea with vomiting, unspecified: Secondary | ICD-10-CM

## 2013-03-30 DIAGNOSIS — I498 Other specified cardiac arrhythmias: Secondary | ICD-10-CM | POA: Diagnosis present

## 2013-03-30 DIAGNOSIS — E1110 Type 2 diabetes mellitus with ketoacidosis without coma: Secondary | ICD-10-CM | POA: Diagnosis present

## 2013-03-30 DIAGNOSIS — E876 Hypokalemia: Secondary | ICD-10-CM | POA: Diagnosis present

## 2013-03-30 DIAGNOSIS — D649 Anemia, unspecified: Secondary | ICD-10-CM | POA: Diagnosis present

## 2013-03-30 DIAGNOSIS — E860 Dehydration: Secondary | ICD-10-CM

## 2013-03-30 DIAGNOSIS — K859 Acute pancreatitis without necrosis or infection, unspecified: Secondary | ICD-10-CM | POA: Diagnosis present

## 2013-03-30 LAB — URINALYSIS, ROUTINE W REFLEX MICROSCOPIC
Ketones, ur: >80 mg/dL — AB
Nitrite: NEGATIVE
Specific Gravity, Urine: 1.025 (ref 1.005–1.030)
Urobilinogen, UA: 0.2 mg/dL (ref 0.0–1.0)
pH: 5.5 (ref 5.0–8.0)

## 2013-03-30 LAB — CBC WITH DIFFERENTIAL/PLATELET
Basophils Absolute: 0 10*3/uL (ref 0.0–0.1)
Basophils Relative: 0 % (ref 0–1)
Eosinophils Relative: 0 % (ref 0–5)
HCT: 49.1 % — ABNORMAL HIGH (ref 36.0–46.0)
Hemoglobin: 15.3 g/dL — ABNORMAL HIGH (ref 12.0–15.0)
Lymphocytes Relative: 6 % — ABNORMAL LOW (ref 12–46)
Lymphs Abs: 2.2 10*3/uL (ref 0.7–4.0)
MCV: 100.6 fL — ABNORMAL HIGH (ref 78.0–100.0)
Monocytes Relative: 7 % (ref 3–12)
Neutro Abs: 31.7 10*3/uL — ABNORMAL HIGH (ref 1.7–7.7)
RBC: 4.88 MIL/uL (ref 3.87–5.11)
RDW: 13.4 % (ref 11.5–15.5)
WBC: 36.4 10*3/uL — ABNORMAL HIGH (ref 4.0–10.5)

## 2013-03-30 LAB — BLOOD GAS, VENOUS
Acid-base deficit: 24.4 mmol/L — ABNORMAL HIGH (ref 0.0–2.0)
Bicarbonate: 5.1 mEq/L — ABNORMAL LOW (ref 20.0–24.0)
FIO2: 0.21 %
TCO2: 5.1 mmol/L (ref 0–100)
pCO2, Ven: 23.7 mmHg — ABNORMAL LOW (ref 45.0–50.0)
pH, Ven: 6.961 — CL (ref 7.250–7.300)

## 2013-03-30 LAB — CBC
Hemoglobin: 10.4 g/dL — ABNORMAL LOW (ref 12.0–15.0)
MCH: 30.6 pg (ref 26.0–34.0)
MCHC: 34.4 g/dL (ref 30.0–36.0)
Platelets: 363 10*3/uL (ref 150–400)
RBC: 3.4 MIL/uL — ABNORMAL LOW (ref 3.87–5.11)

## 2013-03-30 LAB — BASIC METABOLIC PANEL
BUN: 26 mg/dL — ABNORMAL HIGH (ref 6–23)
BUN: 25 mg/dL — ABNORMAL HIGH (ref 6–23)
CO2: <7 mEq/L — CL (ref 19–32)
CO2: 9 mEq/L — CL (ref 19–32)
Calcium: 7.2 mg/dL — ABNORMAL LOW (ref 8.4–10.5)
Chloride: 80 mEq/L — ABNORMAL LOW (ref 96–112)
Creatinine, Ser: 1.14 mg/dL — ABNORMAL HIGH (ref 0.50–1.10)
GFR calc Af Amer: 80 mL/min — ABNORMAL LOW (ref >90)
GFR calc Af Amer: 73 mL/min — ABNORMAL LOW (ref >90)
GFR calc non Af Amer: 69 mL/min — ABNORMAL LOW (ref >90)
GFR calc non Af Amer: 63 mL/min — ABNORMAL LOW (ref >90)
Sodium: 132 mEq/L — ABNORMAL LOW (ref 135–145)
Sodium: 141 mEq/L (ref 135–145)

## 2013-03-30 LAB — LIPASE, BLOOD: Lipase: 24 U/L (ref 11–59)

## 2013-03-30 LAB — GLUCOSE, CAPILLARY
Glucose-Capillary: >600 mg/dL (ref 70–99)
Glucose-Capillary: >600 mg/dL (ref 70–99)
Glucose-Capillary: 290 mg/dL — ABNORMAL HIGH (ref 70–99)

## 2013-03-30 LAB — POCT PREGNANCY, URINE: Preg Test, Ur: NEGATIVE

## 2013-03-30 LAB — HEPATIC FUNCTION PANEL
Alkaline Phosphatase: 132 U/L — ABNORMAL HIGH (ref 39–117)
Bilirubin, Direct: 0.1 mg/dL (ref 0.0–0.3)
Indirect Bilirubin: 0.2 mg/dL — ABNORMAL LOW (ref 0.3–0.9)
Total Protein: 9.7 g/dL — ABNORMAL HIGH (ref 6.0–8.3)

## 2013-03-30 LAB — URINE MICROSCOPIC-ADD ON

## 2013-03-30 LAB — LACTIC ACID, PLASMA: Lactic Acid, Venous: 8.6 mmol/L — ABNORMAL HIGH (ref 0.5–2.2)

## 2013-03-30 MED ORDER — ENOXAPARIN SODIUM 40 MG/0.4ML ~~LOC~~ SOLN
40.0000 mg | SUBCUTANEOUS | Status: DC
  Administered 2013-03-31 – 2013-04-01 (×3): 40 mg via SUBCUTANEOUS
  Filled 2013-03-30 (×6): qty 0.4

## 2013-03-30 MED ORDER — SODIUM CHLORIDE 0.9 % IV SOLN: INTRAVENOUS | Status: DC

## 2013-03-30 MED ORDER — DEXTROSE 50 % IV SOLN: 25.0000 mL | INTRAVENOUS | Status: DC | PRN

## 2013-03-30 MED ORDER — SODIUM CHLORIDE 0.9 % IV SOLN
INTRAVENOUS | Status: AC
  Administered 2013-03-30: 21:00:00 via INTRAVENOUS

## 2013-03-30 MED ORDER — SODIUM CHLORIDE 0.9 % IV SOLN
INTRAVENOUS | Status: DC
  Administered 2013-03-30: 10.8 [IU]/h via INTRAVENOUS
  Administered 2013-03-30: 5.4 [IU]/h via INTRAVENOUS
  Filled 2013-03-30: qty 1

## 2013-03-30 MED ORDER — SODIUM CHLORIDE 0.9 % IV BOLUS (SEPSIS)
1000.0000 mL | Freq: Once | INTRAVENOUS | Status: AC
  Administered 2013-03-30: 1000 mL via INTRAVENOUS

## 2013-03-30 MED ORDER — ONDANSETRON HCL 4 MG/2ML IJ SOLN
4.0000 mg | Freq: Once | INTRAMUSCULAR | Status: AC
  Administered 2013-03-30: 4 mg via INTRAVENOUS
  Filled 2013-03-30: qty 2

## 2013-03-30 MED ORDER — ONDANSETRON HCL 4 MG/2ML IJ SOLN
4.0000 mg | Freq: Four times a day (QID) | INTRAMUSCULAR | Status: DC | PRN
  Administered 2013-03-31 – 2013-04-02 (×5): 4 mg via INTRAVENOUS
  Filled 2013-03-30 (×5): qty 2

## 2013-03-30 MED ORDER — DEXTROSE-NACL 5-0.45 % IV SOLN
INTRAVENOUS | Status: DC
  Administered 2013-03-30: 23:00:00 via INTRAVENOUS

## 2013-03-30 MED ORDER — MORPHINE SULFATE 4 MG/ML IJ SOLN
4.0000 mg | Freq: Once | INTRAMUSCULAR | Status: AC
  Administered 2013-03-30: 4 mg via INTRAVENOUS
  Filled 2013-03-30: qty 1

## 2013-03-30 MED ORDER — SODIUM CHLORIDE 0.9 % IV SOLN
INTRAVENOUS | Status: AC
  Administered 2013-03-30: 23:00:00 via INTRAVENOUS

## 2013-03-30 MED ORDER — POTASSIUM CHLORIDE 10 MEQ/100ML IV SOLN
10.0000 meq | INTRAVENOUS | Status: AC
  Administered 2013-03-30 – 2013-03-31 (×4): 10 meq via INTRAVENOUS
  Filled 2013-03-30 (×4): qty 100

## 2013-03-30 NOTE — ED Notes (Signed)
Pt reports vomiting and diarrhea since Monday am, pt is diabetic reports bs 70 this am. No fever, recently treated for a uti.

## 2013-03-30 NOTE — ED Notes (Signed)
critcal co2 of 9 given to [facility]. They are contacting the admitting md at [facility]

## 2013-03-30 NOTE — ED Provider Notes (Signed)
CSN: [id number]     Arrival date & time 03/30/13  1316 History  This chart was scribed for Linda Churn, MD by Quintella Reichert, ED scribe.  This patient was seen in room APA14/APA14 and the patient's care was started at 2:41 PM  .  Chief Complaint  Patient presents with  . Abdominal Pain  . Emesis    Patient is a 22 y.o. female presenting with abdominal pain. The history is provided by the patient. No language interpreter was used.  Abdominal Pain Pain location:  LLQ Pain quality: sharp   Pain severity:  Severe Onset quality:  Gradual Duration:  2 days Progression:  Worsening Chronicity:  New Context comment:  Recent kidney infection Ineffective treatments:  None tried (unable to tolerate pain medications) Associated symptoms: diarrhea and vomiting   Associated symptoms: no fever   Associated symptoms comment:  Hyperglycemia Diarrhea:    Diarrhea characteristics: no blood.   Severity:  Mild Vomiting:    Emesis appearance: no blood.   Severity:  Moderate Risk factors comment:  DM and hereditary pancreatitis   HPI Comments: Linda Walls is a 22 y.o. female with h/o DM and hereditary pancreatitis who presents to the Emergency Department complaining of 2 days of sharp lower left abdominal pain with associated emesis, diarrhea and hyperglycemia.  Pt states her pain is similar to prior pancreatitis flare-ups.  Presently she rates pain at severity of 8/10.  She has not been able to tolerate pain medication.  Pt notes that she had a kidney infection 2 weeks ago.  Father also notes that pt's CBG raised dramatically over the course of the day today, from 27 2 1/2 hours ago to over 600 on arrival to the ED.  He notes CBG has been ranging from 140-180 recently before today.  She has prior h/o hospitalization for DKA.   Past Medical History  Diagnosis Date  . Hereditary pancreatitis   . DM type 1 (diabetes mellitus, type 1)   . Hernia   . GERD (gastroesophageal reflux disease)    . Jaundice     Past Surgical History  Procedure Laterality Date  . Hernia repair      As infant.    Family History  Problem Relation Age of Onset  . Pancreatitis Father 30    hereditary  . Hypertension Father   . GER disease Father     History  Substance Use Topics  . Smoking status: Never Smoker   . Smokeless tobacco: Never Used  . Alcohol Use: No    OB History   Grav Para Term Preterm Abortions TAB SAB Ect Mult Living                  Review of Systems  Constitutional: Negative for fever.  Gastrointestinal: Positive for vomiting, abdominal pain and diarrhea.  All other systems reviewed and are negative.     Allergies  Cefzil; Shellfish allergy; and Sulfa antibiotics  Home Medications   Current Outpatient Rx  Name  Route  Sig  Dispense  Refill  . ciprofloxacin (CIPRO) 500 MG tablet   Oral   Take 1 tablet (500 mg total) by mouth 2 (two) times daily.   10 tablet   0   . hyoscyamine (LEVSIN SL) 0.125 MG SL tablet   Sublingual   Place 0.125 mg under the tongue every 4 (four) hours as needed for cramping or diarrhea or loose stools.         Marland Kitchen ibuprofen (ADVIL,MOTRIN) 200  MG tablet   Oral   Take 400 mg by mouth every 6 (six) hours as needed. pain         . insulin aspart (NOVOLOG) 100 UNIT/ML injection   Subcutaneous   Inject 15 Units into the skin 3 (three) times daily before meals. *Increase subcutaneous injection by 1 unit for every 50 units over 150.*         . insulin glargine (LANTUS) 100 UNIT/ML injection   Subcutaneous   Inject 45 Units into the skin every evening.          . Multiple Vitamins-Minerals (MULTIVITAMINS THER. W/MINERALS) TABS   Oral   Take 1 tablet by mouth daily. For energy         . omeprazole (PRILOSEC) 20 MG capsule   Oral   Take 20 mg by mouth daily.         Marland Kitchen oxyCODONE-acetaminophen (PERCOCET) 7.5-325 MG per tablet   Oral   Take 1 tablet by mouth every 6 (six) hours as needed. For pain   30 tablet    0     Needs office visit before further refills.   . Pancrelipase, Lip-Prot-Amyl, 6000 UNITS CPEP   Oral   Take 2 capsules (12,000 Units total) by mouth 3 (three) times daily before meals.   180 capsule   6     May also had one before large snacks   . promethazine (PHENERGAN) 25 MG tablet   Oral   Take 1 tablet (25 mg total) by mouth every 6 (six) hours as needed for nausea.   30 tablet   0   . Vitamin D, Ergocalciferol, (DRISDOL) 50000 UNITS CAPS   Oral   Take 50,000 Units by mouth every 7 (seven) days. Takes on Wednesdays.          BP 101/51  Pulse 160  Temp(Src) 98.3 F (36.8 C) (Oral)  Resp 30  Ht 5\' 2"  (1.575 m)  Wt 110 lb (49.896 kg)  BMI 20.11 kg/m2  SpO2 100%  LMP 01/30/2013  Physical Exam  Nursing note and vitals reviewed. Constitutional: She is oriented to person, place, and time. She appears well-developed and well-nourished. No distress.  HENT:  Head: Normocephalic and atraumatic.  Mouth/Throat: Oropharynx is clear and moist.  Eyes: Conjunctivae are normal. Pupils are equal, round, and reactive to light. No scleral icterus.  Neck: Neck supple.  Cardiovascular: Regular rhythm, normal heart sounds and intact distal pulses.  Tachycardia present.   No murmur heard. Pulmonary/Chest: Breath sounds normal. No stridor. Tachypnea noted. No respiratory distress. She has no rales.  Kussmaul respirations  Abdominal: Soft. Bowel sounds are normal. She exhibits no distension. There is tenderness (LUQ). There is no rigidity, no rebound and no guarding.  Musculoskeletal: Normal range of motion.  Neurological: She is alert and oriented to person, place, and time.  Skin: Skin is warm and dry. No rash noted.  Psychiatric: She has a normal mood and affect. Her behavior is normal.    ED Course  CENTRAL LINE Date/Time: 03/30/2013 7:24 PM Performed by: Blake Divine DAVID Authorized by: Blake Divine DAVID Consent: Verbal consent obtained. written consent  obtained. Risks and benefits: risks, benefits and alternatives were discussed Consent given by: patient and parent Patient identity confirmed: verbally with patient and arm band Time out: Immediately prior to procedure a "time out" was called to verify the correct patient, procedure, equipment, support staff and site/side marked as required. Indications: vascular access Local anesthetic: lidocaine 1% without epinephrine Preparation:  skin prepped with 2% chlorhexidine Skin prep agent dried: skin prep agent completely dried prior to procedure Sterile barriers: all five maximum sterile barriers used - cap, mask, sterile gown, sterile gloves, and large sterile sheet Hand hygiene: hand hygiene performed prior to central venous catheter insertion Location details: right internal jugular Patient position: Trendelenburg Catheter type: triple lumen Pre-procedure: landmarks identified Ultrasound guidance: yes Number of attempts: 1 Successful placement: yes Post-procedure: line sutured and dressing applied Assessment: blood return through all ports, free fluid flow, placement verified by x-ray and no pneumothorax on x-ray Patient tolerance: Patient tolerated the procedure well with no immediate complications.  CRITICAL CARE Performed by: Blake Divine DAVID Authorized by: Blake Divine DAVID Total critical care time: 55 minutes Critical care time was exclusive of separately billable procedures and treating other patients. Critical care was necessary to treat or prevent imminent or life-threatening deterioration of the following conditions: metabolic crisis. Critical care was time spent personally by me on the following activities: development of treatment plan with patient or surrogate, discussions with consultants, evaluation of patient's response to treatment, examination of patient, obtaining history from patient or surrogate, ordering and performing treatments and interventions, ordering and  review of laboratory studies, ordering and review of radiographic studies, pulse oximetry, re-evaluation of patient's condition and review of old charts.   (including critical care time)  DIAGNOSTIC STUDIES: Oxygen Saturation is 100% on room air, normal by my interpretation.    COORDINATION OF CARE: 2:47 PM-Discussed treatment plan which includes IV fluids, CBG monitoring, cardiac monitoring, and labs with pt at bedside and pt agreed to plan.    Labs Review Labs Reviewed  GLUCOSE, CAPILLARY - Abnormal; Notable for the following:    Glucose-Capillary >600 (*)    All other components within normal limits  CBC WITH DIFFERENTIAL  URINALYSIS, ROUTINE W REFLEX MICROSCOPIC  LACTIC ACID, PLASMA  BLOOD GAS, VENOUS  BASIC METABOLIC PANEL  HEPATIC FUNCTION PANEL  LIPASE, BLOOD    Imaging Review Dg Chest Portable 1 View  03/30/2013   CLINICAL DATA:  Central catheter placement  EXAM: PORTABLE CHEST - 1 VIEW  COMPARISON:  May 02, 2012  FINDINGS: Central catheter tip is at the cavoatrial junction. No pneumothorax. Lungs are clear. Heart size and pulmonary vascularity are normal. No adenopathy. No bone lesions.  IMPRESSION: Central catheter tip at cavoatrial junction. No pneumothorax. Lungs clear.   Electronically Signed   By: Bretta Bang   On: 03/30/2013 18:10  All radiology studies independently viewed by me.     EKG - Sinus tachycardia, rate 159, normal axis, nonspecific ST/T changes, compared to prior, rate faster and nonspecific ST/T changes now present.    MDM   1. DKA (diabetic ketoacidoses)   2. Dehydration   3. DM type 1 (diabetes mellitus, type 52)    22 year old female with history of diabetes presenting with several days of nausea and vomiting and abdominal pain with blood sugars undetectable by glucometer.  Lab work confirmed severe acidosis from DKA. Vascular access difficult to obtain DT severe dehydration, therefore central line was placed successfully. IV fluids, IV  insulin drip given for DKA. IV morphine and Zofran given for abdominal pain nausea. I discussed the case with Dr. Arcelia Jew (Hospitalist) and Dr. Vassie Loll (Critical Care) with the plan to transfer to [facility] for intensive care.    I personally performed the services described in this documentation, which was scribed in my presence. The recorded information has been reviewed and is accurate.  Linda Churn, MD 03/30/13 2222

## 2013-03-30 NOTE — H&P (Signed)
PULMONARY  / CRITICAL CARE MEDICINE  Name: Linda Walls MRN: [id number] DOB: 1991-02-03    ADMISSION DATE:  03/30/2013 CONSULTATION DATE:  03/30/2013  REFERRING MD :  Shelly Rubenstein PRIMARY SERVICE: PCCM  CHIEF COMPLAINT:  DKA  BRIEF PATIENT DESCRIPTION: 22 y/o female was admitted on 10/1 with DKA from [facility].  SIGNIFICANT EVENTS / STUDIES:    LINES / TUBES: 10/1 R IJ CVL >>  CULTURES:   ANTIBIOTICS:   HISTORY OF PRESENT ILLNESS:  22 y/o female was admitted on 10/1 with DKA from [facility].  She stated that she had been having abdominal pain consistent with her prior episodes of pancreatitis for two weeks prior to admission.  Three days prior to admission she developed nausea and vomiting.  One day prior to admission she was unable to keep anything down.  She kept taking her glargine insulin through the nausea and vomiting (last dose 9/30 PM), but on did not take novolog insulin on 10/1.  She was found to be in DKA on 10/1 and was admitted to the [facility] ICU on request from the [facility] service.   PAST MEDICAL HISTORY :  Past Medical History  Diagnosis Date  . Hereditary pancreatitis   . DM type 1 (diabetes mellitus, type 1)   . Hernia   . GERD (gastroesophageal reflux disease)   . Jaundice    Past Surgical History  Procedure Laterality Date  . Hernia repair      As infant.   Prior to Admission medications   Medication Sig Start Date End Date Taking? Authorizing Provider  ibuprofen (ADVIL,MOTRIN) 200 MG tablet Take 400 mg by mouth every 6 (six) hours as needed. pain   Yes Historical Provider, MD  insulin aspart (NOVOLOG) 100 UNIT/ML injection Inject 15 Units into the skin 3 (three) times daily before meals. *Increase subcutaneous injection by 1 unit for every 50 units over 150.* 05/02/12  Yes Elliot Cousin, MD  insulin glargine (LANTUS) 100 UNIT/ML injection Inject 45 Units into the skin every evening.    Yes Historical Provider, MD  Multiple Vitamins-Minerals (MULTIVITAMINS THER.  W/MINERALS) TABS Take 1 tablet by mouth daily. For energy   Yes Historical Provider, MD  omeprazole (PRILOSEC) 20 MG capsule Take 20 mg by mouth daily.   Yes Historical Provider, MD  oxyCODONE-acetaminophen (PERCOCET) 7.5-325 MG per tablet Take 1 tablet by mouth every 6 (six) hours as needed. For pain 02/17/13  Yes Merlyn Albert, MD  Pancrelipase, Lip-Prot-Amyl, 6000 UNITS CPEP Take 2 capsules (12,000 Units total) by mouth 3 (three) times daily before meals. 10/26/12  Yes Merlyn Albert, MD  promethazine (PHENERGAN) 25 MG suppository Place 25 mg rectally every 6 (six) hours as needed for nausea.   Yes Historical Provider, MD  Vitamin D, Ergocalciferol, (DRISDOL) 50000 UNITS CAPS Take 50,000 Units by mouth every 7 (seven) days. Takes on Wednesdays. 09/15/12  Yes Historical Provider, MD  hyoscyamine (LEVSIN SL) 0.125 MG SL tablet Place 0.125 mg under the tongue every 4 (four) hours as needed for cramping or diarrhea or loose stools.    Historical Provider, MD  promethazine (PHENERGAN) 25 MG tablet Take 1 tablet (25 mg total) by mouth every 6 (six) hours as needed for nausea. 12/25/12   Erick Blinks, MD   Allergies  Allergen Reactions  . Cefzil [Cefprozil]     Sickness  . Shellfish Allergy Nausea And Vomiting  . Sulfa Antibiotics Rash    FAMILY HISTORY:  Family History  Problem Relation Age of Onset  .  Pancreatitis Father 30    hereditary  . Hypertension Father   . GER disease Father    SOCIAL HISTORY:  reports that she has never smoked. She has never used smokeless tobacco. She reports that she does not drink alcohol or use illicit drugs.  REVIEW OF SYSTEMS:   Gen: Denies fever, chills, weight change, fatigue, night sweats HEENT: Denies blurred vision, double vision, hearing loss, tinnitus, sinus congestion, rhinorrhea, sore throat, neck stiffness, dysphagia PULM: Denies shortness of breath, cough, sputum production, hemoptysis, wheezing CV: Denies chest pain, edema, orthopnea,  paroxysmal nocturnal dyspnea, palpitations GI: per HPI GU: Denies dysuria, hematuria, polyuria, oliguria, urethral discharge Endocrine: per HPI Derm: Denies rash, dry skin, scaling or peeling skin change Heme: Denies easy bruising, bleeding, bleeding gums Neuro: Denies headache, numbness, weakness, slurred speech, loss of memory or consciousness   SUBJECTIVE:   VITAL SIGNS: Temp:  [98.3 F (36.8 C)-99.8 F (37.7 C)] 99.8 F (37.7 C) (10/01 2200) Pulse Rate:  [135-160] 135 (10/01 2100) Resp:  [19-34] 19 (10/01 2205) BP: (98-115)/(50-65) 115/50 mmHg (10/01 2205) SpO2:  [96 %-100 %] 97 % (10/01 2205) Weight:  [49.896 kg (110 lb)] 49.896 kg (110 lb) (10/01 1359) HEMODYNAMICS:   VENTILATOR SETTINGS:   INTAKE / OUTPUT: Intake/Output   None     PHYSICAL EXAMINATION:  Gen: well appearing, no acute distress HEENT: NCAT, PERRL, EOMi, OP clear, neck supple without masses PULM: CTA B CV: tachy, regular, no mgr, no JVD AB: BS+, soft, mildly tender epigastrum, no hsm Ext: warm, no edema, no clubbing, no cyanosis Derm: no rash or skin breakdown Neuro: A&Ox4, CN II-XII intact, MAEW   LABS:  CBC Recent Labs     03/30/13  1424  WBC  36.4*  HGB  15.3*  HCT  49.1*  PLT  717*   Coag's No results found for this basename: APTT, INR,  in the last 72 hours BMET Recent Labs     03/30/13  1424  03/30/13  1959  NA  132*  141  K  6.7*  3.1*  CL  80*  106  CO2  <7*  9*  BUN  26*  25*  CREATININE  1.14*  1.23*  GLUCOSE  941*  407*   Electrolytes Recent Labs     03/30/13  1424  03/30/13  1959  CALCIUM  10.6*  7.2*   Sepsis Markers No results found for this basename: LACTICACIDVEN, PROCALCITON, O2SATVEN,  in the last 72 hours ABG No results found for this basename: PHART, PCO2ART, PO2ART,  in the last 72 hours Liver Enzymes Recent Labs     03/30/13  1424  AST  49*  ALT  30  ALKPHOS  132*  BILITOT  0.3  ALBUMIN  5.7*   Cardiac Enzymes No results found for this  basename: TROPONINI, PROBNP,  in the last 72 hours Glucose Recent Labs     03/30/13  1411  03/30/13  1729  03/30/13  1915  03/30/13  2101  GLUCAP  >600*  >600*  >600*  290*    Imaging   CXR: lung clear, CVL in place  ASSESSMENT / PLAN:  ENDOCRINE A:  DKA in setting of recurrent pancreatis, blood glucose improving P:   -continue insulin gtt per protocol until anion gap closed -q4h BMET -continue IVF (normal saline) as written -assuming nausea and vomiting improved, advance diet on 10/2 -restart glargine 10/2 -start D5 if gap not closed and blood glucose < 250  GASTROINTESTINAL A:  Acute pancreatitis  by history> considering improvement in symptoms, does not appear severe P:   -serial exams -advance diet 10/2 as tolerated -prn zofran  PULMONARY A:No acute issues P:   -monitor respiratory status  CARDIOVASCULAR A: Sinus tach from DKA/volume depletion, improving P:  -continue IVF -tele  RENAL A:  Hypokalemia in setting of DKA Mixed metabolic acidosis with Lactic acidosis likely due to DKA P:   -replete K -recheck lactic acid   HEMATOLOGIC A:  hemoconcentration P:  -repeat CBC in AM  INFECTIOUS A:  Leukocytosis but no clear evidence of infection at this point, clinically improving with DKA treatment alone P: -monitor for fever -hold antibiotics for now   NEUROLOGIC A:  No acute issues P:   -monitor mental status   TODAY'S SUMMARY:   I have personally obtained a history, examined the patient, evaluated laboratory and imaging results, formulated the assessment and plan and placed orders. CRITICAL CARE: The patient is critically ill with multiple organ systems failure and requires high complexity decision making for assessment and support, frequent evaluation and titration of therapies, application of advanced monitoring technologies and extensive interpretation of multiple databases. Critical Care Time devoted to patient care services described in  this note is 45 minutes.   Fonnie Jarvis Pulmonary and Critical Care Medicine [facility] Pager: [phone]  03/30/2013, 11:14 PM

## 2013-03-30 NOTE — ED Notes (Addendum)
Rate on insulin drip increased to 16.2 ml per hr

## 2013-03-30 NOTE — ED Notes (Signed)
Pt reports pain 9/10  

## 2013-03-31 DIAGNOSIS — K859 Acute pancreatitis without necrosis or infection, unspecified: Secondary | ICD-10-CM

## 2013-03-31 LAB — BASIC METABOLIC PANEL
BUN: 23 mg/dL (ref 6–23)
BUN: 10 mg/dL (ref 6–23)
BUN: 7 mg/dL (ref 6–23)
CO2: 21 mEq/L (ref 19–32)
Calcium: 7.3 mg/dL — ABNORMAL LOW (ref 8.4–10.5)
Calcium: 8 mg/dL — ABNORMAL LOW (ref 8.4–10.5)
Calcium: 8.1 mg/dL — ABNORMAL LOW (ref 8.4–10.5)
Calcium: 8.4 mg/dL (ref 8.4–10.5)
Calcium: 8.2 mg/dL — ABNORMAL LOW (ref 8.4–10.5)
Chloride: 104 mEq/L (ref 96–112)
Chloride: 107 mEq/L (ref 96–112)
Creatinine, Ser: 0.85 mg/dL (ref 0.50–1.10)
Creatinine, Ser: 0.69 mg/dL (ref 0.50–1.10)
GFR calc Af Amer: >90 mL/min (ref >90)
GFR calc Af Amer: >90 mL/min (ref >90)
GFR calc Af Amer: >90 mL/min (ref >90)
GFR calc Af Amer: >90 mL/min (ref >90)
GFR calc non Af Amer: >90 mL/min (ref >90)
GFR calc non Af Amer: >90 mL/min (ref >90)
GFR calc non Af Amer: >90 mL/min (ref >90)
GFR calc non Af Amer: >90 mL/min (ref >90)
Glucose, Bld: 134 mg/dL — ABNORMAL HIGH (ref 70–99)
Potassium: 3.8 mEq/L (ref 3.5–5.1)
Potassium: 3.2 mEq/L — ABNORMAL LOW (ref 3.5–5.1)
Sodium: 138 mEq/L (ref 135–145)
Sodium: 133 mEq/L — ABNORMAL LOW (ref 135–145)
Sodium: 136 mEq/L (ref 135–145)

## 2013-03-31 LAB — GLUCOSE, CAPILLARY
Glucose-Capillary: 245 mg/dL — ABNORMAL HIGH (ref 70–99)
Glucose-Capillary: 253 mg/dL — ABNORMAL HIGH (ref 70–99)
Glucose-Capillary: 137 mg/dL — ABNORMAL HIGH (ref 70–99)
Glucose-Capillary: 145 mg/dL — ABNORMAL HIGH (ref 70–99)
Glucose-Capillary: 176 mg/dL — ABNORMAL HIGH (ref 70–99)
Glucose-Capillary: 176 mg/dL — ABNORMAL HIGH (ref 70–99)
Glucose-Capillary: 185 mg/dL — ABNORMAL HIGH (ref 70–99)
Glucose-Capillary: 201 mg/dL — ABNORMAL HIGH (ref 70–99)
Glucose-Capillary: 196 mg/dL — ABNORMAL HIGH (ref 70–99)
Glucose-Capillary: 191 mg/dL — ABNORMAL HIGH (ref 70–99)

## 2013-03-31 MED ORDER — SODIUM CHLORIDE 0.9 % IV SOLN: INTRAVENOUS | Status: DC

## 2013-03-31 MED ORDER — SODIUM CHLORIDE 0.9 % IV SOLN
INTRAVENOUS | Status: DC
  Administered 2013-03-31: 3.5 [IU]/h via INTRAVENOUS
  Filled 2013-03-31: qty 1

## 2013-03-31 MED ORDER — BOOST / RESOURCE BREEZE PO LIQD
1.0000 | Freq: Every day | ORAL | Status: DC
  Administered 2013-03-31 – 2013-04-01 (×2): 1 via ORAL

## 2013-03-31 MED ORDER — INSULIN GLARGINE 100 UNIT/ML ~~LOC~~ SOLN
30.0000 [IU] | Freq: Every day | SUBCUTANEOUS | Status: DC
  Administered 2013-03-31 – 2013-04-01 (×2): 30 [IU] via SUBCUTANEOUS
  Filled 2013-03-31 (×3): qty 0.3

## 2013-03-31 MED ORDER — MORPHINE SULFATE 4 MG/ML IJ SOLN
2.0000 mg | INTRAMUSCULAR | Status: DC | PRN
  Administered 2013-03-31 – 2013-04-01 (×12): 2 mg via INTRAVENOUS
  Filled 2013-03-31 (×13): qty 1

## 2013-03-31 MED ORDER — INSULIN ASPART 100 UNIT/ML ~~LOC~~ SOLN
0.0000 [IU] | Freq: Three times a day (TID) | SUBCUTANEOUS | Status: DC
  Administered 2013-04-01: 11 [IU] via SUBCUTANEOUS
  Administered 2013-04-01: 2 [IU] via SUBCUTANEOUS
  Administered 2013-04-01: 3 [IU] via SUBCUTANEOUS

## 2013-03-31 MED ORDER — SODIUM CHLORIDE 0.9 % IV SOLN
INTRAVENOUS | Status: DC
  Administered 2013-03-31: 1.9 [IU]/h via INTRAVENOUS
  Filled 2013-03-31: qty 1

## 2013-03-31 MED ORDER — INSULIN ASPART 100 UNIT/ML ~~LOC~~ SOLN: 2.0000 [IU] | SUBCUTANEOUS | Status: DC

## 2013-03-31 MED ORDER — INSULIN ASPART 100 UNIT/ML ~~LOC~~ SOLN
0.0000 [IU] | Freq: Every day | SUBCUTANEOUS | Status: DC
  Administered 2013-03-31: 3 [IU] via SUBCUTANEOUS

## 2013-03-31 MED ORDER — DEXTROSE-NACL 5-0.45 % IV SOLN: INTRAVENOUS | Status: DC

## 2013-03-31 MED ORDER — INSULIN GLARGINE 100 UNIT/ML ~~LOC~~ SOLN: 45.0000 [IU] | Freq: Every day | SUBCUTANEOUS | Status: DC

## 2013-03-31 MED ORDER — INSULIN REGULAR BOLUS VIA INFUSION
0.0000 [IU] | Freq: Three times a day (TID) | INTRAVENOUS | Status: DC
  Filled 2013-03-31: qty 10

## 2013-03-31 MED ORDER — DEXTROSE 50 % IV SOLN: 25.0000 mL | INTRAVENOUS | Status: DC | PRN

## 2013-03-31 MED ORDER — POTASSIUM CHLORIDE CRYS ER 20 MEQ PO TBCR
40.0000 meq | EXTENDED_RELEASE_TABLET | Freq: Once | ORAL | Status: AC
  Administered 2013-03-31: 40 meq via ORAL
  Filled 2013-03-31: qty 2

## 2013-03-31 MED ORDER — DEXTROSE 10 % IV SOLN: INTRAVENOUS | Status: DC | PRN

## 2013-03-31 NOTE — Progress Notes (Signed)
PULMONARY  / CRITICAL CARE MEDICINE  Name: Linda Walls MRN: [id number] DOB: 1991/02/18    ADMISSION DATE:  03/30/2013 CONSULTATION DATE:  03/30/2013  REFERRING MD :  Shelly Rubenstein PRIMARY SERVICE: PCCM  CHIEF COMPLAINT:  DKA  BRIEF PATIENT DESCRIPTION: 22 y/o female was admitted on 10/1 with DKA from [facility].  SIGNIFICANT EVENTS / STUDIES:    LINES / TUBES: 10/1 R IJ CVL >>  CULTURES:   ANTIBIOTICS:   HISTORY OF PRESENT ILLNESS:  22 y/o female was admitted on 10/1 with DKA from [facility].  She stated that she had been having abdominal pain consistent with her prior episodes of pancreatitis for two weeks prior to admission.  Three days prior to admission she developed nausea and vomiting.  One day prior to admission she was unable to keep anything down.  She kept taking her glargine insulin through the nausea and vomiting (last dose 9/30 PM), but on did not take novolog insulin on 10/1.  She was found to be in DKA on 10/1 and was admitted to the [facility] ICU on request from the [facility] service.   SUBJECTIVE:  Feels a bit better, still nauseated but no emesis  VITAL SIGNS: Temp:  [98.1 F (36.7 C)-99.8 F (37.7 C)] 98.1 F (36.7 C) (10/02 0734) Pulse Rate:  [135-160] 135 (10/01 2100) Resp:  [14-34] 15 (10/02 0800) BP: (98-124)/(42-65) 112/48 mmHg (10/02 0800) SpO2:  [96 %-100 %] 98 % (10/02 0800) Weight:  [49.896 kg (110 lb)] 49.896 kg (110 lb) (10/01 1359) HEMODYNAMICS:   VENTILATOR SETTINGS:   INTAKE / OUTPUT: Intake/Output     10/01 0701 - 10/02 0700 10/02 0701 - 10/03 0700   I.V. (mL/kg) 273.2 (5.5) 893.8 (17.9)   IV Piggyback 200    Total Intake(mL/kg) 473.2 (9.5) 893.8 (17.9)   Urine (mL/kg/hr) 850    Total Output 850     Net -376.8 +893.8          PHYSICAL EXAMINATION:  Gen: well appearing, no acute distress HEENT: NCAT, PERRL, EOMi, OP clear, neck supple without masses PULM: CTA B CV: tachy, regular, no mgr, no JVD AB: BS+, soft, mildly tender epigastrum, no  hsm Ext: warm, no edema, no clubbing, no cyanosis Derm: no rash or skin breakdown Neuro: A&Ox4, CN II-XII intact, MAEW   LABS:  CBC Recent Labs     03/30/13  1424  03/30/13  2320  WBC  36.4*  26.7*  HGB  15.3*  10.4*  HCT  49.1*  30.2*  PLT  717*  363   Coag's No results found for this basename: APTT, INR,  in the last 72 hours BMET Recent Labs     03/30/13  2320  03/31/13  0335  03/31/13  0600  NA  142  138  138  K  3.9  4.3  3.9  CL  111  108  107  CO2  15*  15*  17*  BUN  23  17  15   CREATININE  0.85  0.69  0.62  GLUCOSE  167*  174*  155*   Electrolytes Recent Labs     03/30/13  2320  03/31/13  0335  03/31/13  0600  CALCIUM  7.3*  8.0*  8.1*   Sepsis Markers No results found for this basename: LACTICACIDVEN, PROCALCITON, O2SATVEN,  in the last 72 hours ABG No results found for this basename: PHART, PCO2ART, PO2ART,  in the last 72 hours Liver Enzymes Recent Labs     03/30/13  1424  AST  49*  ALT  30  ALKPHOS  132*  BILITOT  0.3  ALBUMIN  5.7*   Cardiac Enzymes No results found for this basename: TROPONINI, PROBNP,  in the last 72 hours Glucose Recent Labs     03/31/13  0201  03/31/13  0303  03/31/13  0357  03/31/13  0504  03/31/13  0602  03/31/13  0656  GLUCAP  245*  200*  132*  139*  140*  158*    Imaging   CXR: lung clear, CVL in place  ASSESSMENT / PLAN:  ENDOCRINE A:  DKA in setting of recurrent pancreatis, volume losses. AG 14 on 10/2 am P:   -continue insulin gtt per protocol until anion gap closed, CO2 > 20 -q4h BMET -continue IVF (normal saline) as written -start clears 10/2 -restart glargine 10/2 pm (when ready to transition off insulin gtt)  GASTROINTESTINAL A:  Acute (on chronic) pancreatitis by history> considering improvement in symptoms, does not appear severe P:   -serial exams -advance diet 10/2 to clears -prn zofran  PULMONARY A:No acute issues P:   -monitor respiratory status  CARDIOVASCULAR A:  Sinus tach from DKA/volume depletion, improved P:  -continue IVF -tele  RENAL A:  Hypokalemia in setting of DKA Mixed metabolic acidosis with Lactic acidosis likely due to DKA P:   -repeat lactate w next blood draw   HEMATOLOGIC A:  hemoconcentration P:  -follow  INFECTIOUS A:  Leukocytosis but no clear evidence of infection at this point, clinically improving with DKA treatment alone P: -monitor for fever -hold antibiotics for now   NEUROLOGIC A:  No acute issues P:   -monitor mental status    TODAY'S SUMMARY:   I have personally obtained a history, examined the patient, evaluated laboratory and imaging results, formulated the assessment and plan and placed orders. CRITICAL CARE: The patient is critically ill with multiple organ systems failure and requires high complexity decision making for assessment and support, frequent evaluation and titration of therapies, application of advanced monitoring technologies and extensive interpretation of multiple databases. Critical Care Time devoted to patient care services described in this note is 45 minutes.   Levy Pupa, MD, PhD 03/31/2013, 9:33 AM [facility] [phone] or if no answer [phone]

## 2013-03-31 NOTE — Progress Notes (Signed)
INITIAL NUTRITION ASSESSMENT  DOCUMENTATION CODES Per approved criteria  -Not Applicable   INTERVENTION:  Resource Breeze daily (250 kcals, 9 gm protein per 8 fl oz carton) RD to follow for nutrition care plan  NUTRITION DIAGNOSIS: Inadequate oral intake related to decreased appetite, DKA as evidenced by patient report  Goal: Pt to meet >/= 90% of their estimated nutrition needs   Monitor:  PO & supplemental intake, weight, labs, I/O's  Reason for Assessment: Malnutrition Screening Tool Report  22 y.o. female  Admitting Dx: DKA (diabetic ketoacidoses)  ASSESSMENT: Patient transferred from [facility]; admitted with DKA.  RD spoke with patient at bedside; reports a poor appetite few days PTA with abdominal pain and N/V; no significant weight loss reported; currently on Clear Liquids; would benefit from addition of nutrition supplement; amenable to [employer]; discussed with RN as she's on IV Insulin.  Height: Ht Readings from Last 1 Encounters:  03/30/13 5\' 2"  (1.575 m) (18%*, Z = -0.90)   * Growth percentiles are based on CDC 2-20 Years data.    Weight: Wt Readings from Last 1 Encounters:  03/30/13 110 lb (49.896 kg) (15%*, Z = -1.04)   * Growth percentiles are based on CDC 2-20 Years data.    Ideal Body Weight: 110 lb  % Ideal Body Weight: 100%  Wt Readings from Last 10 Encounters:  03/30/13 110 lb (49.896 kg) (15%*, Z = -1.04)  03/07/13 105 lb 13.1 oz (48 kg) (9%*, Z = -1.34)  12/25/12 120 lb 5.9 oz (54.6 kg) (35%*, Z = -0.38)  10/26/12 121 lb 3.2 oz (54.976 kg) (37%*, Z = -0.32)  05/02/12 117 lb 4.6 oz (53.2 kg) (31%*, Z = -0.49)  05/18/11 107 lb 12.9 oz (48.9 kg) (16%*, Z = -1.00)   * Growth percentiles are based on CDC 2-20 Years data.    Usual Body Weight: 110 lb  % Usual Body Weight: 100%  BMI:  Body mass index is 20.11 kg/(m^2).  Estimated Nutritional Needs: Kcal: 1500-1700 Protein: 70-80 gm Fluid: 1.5-1.7 L  Skin: Intact  Diet Order:  Clear Liquid  EDUCATION NEEDS: -Education not appropriate at this time   Intake/Output Summary (Last 24 hours) at 03/31/13 1220 Last data filed at 03/31/13 1105  Gross per 24 hour  Intake 1746.85 ml  Output    850 ml  Net 896.85 ml    Labs:   Recent Labs Lab 03/30/13 2320 03/31/13 0335 03/31/13 0600  NA 142 138 138  K 3.9 4.3 3.9  CL 111 108 107  CO2 15* 15* 17*  BUN 23 17 15   CREATININE 0.85 0.69 0.62  CALCIUM 7.3* 8.0* 8.1*  GLUCOSE 167* 174* 155*    CBG (last 3)   Recent Labs  03/31/13 0504 03/31/13 0602 03/31/13 0656  GLUCAP 139* 140* 158*    Scheduled Meds: . enoxaparin (LOVENOX) injection  40 mg Subcutaneous Q24H  . insulin regular  0-10 Units Intravenous TID WC    Continuous Infusions: . sodium chloride    . dextrose 5 % and 0.45% NaCl 125 mL/hr at 03/30/13 2255  . insulin (NOVOLIN-R) infusion 5.6 Units/hr (03/31/13 1211)    Past Medical History  Diagnosis Date  . Hereditary pancreatitis   . DM type 1 (diabetes mellitus, type 1)   . Hernia   . GERD (gastroesophageal reflux disease)   . Jaundice     Past Surgical History  Procedure Laterality Date  . Hernia repair      As infant.    Katie  Ranae Palms, RD, LDN Pager #: [phone] After-Hours Pager #: [phone]

## 2013-03-31 NOTE — Care Management Note (Signed)
    Page 1 of 1   03/31/2013     8:21:14 AM   CARE MANAGEMENT NOTE 03/31/2013  Patient:  Linda Walls, Linda Walls   Account Number:  [id number]  Date Initiated:  03/31/2013  Documentation initiated by:  Junius Creamer  Subjective/Objective Assessment:   adm w dka     Action/Plan:   lives w parent, pcp dr Ardyth Gal   Anticipated DC Date:     Anticipated DC Plan:  HOME/SELF CARE      DC Planning Services  CM consult      Choice offered to / List presented to:             Status of service:   Medicare Important Message given?   (If response is "NO", the following Medicare IM given date fields will be blank) Date Medicare IM given:   Date Additional Medicare IM given:    Discharge Disposition:  HOME/SELF CARE  Per UR Regulation:  Reviewed for med. necessity/level of care/duration of stay  If discussed at Long Length of Stay Meetings, dates discussed:    Comments:

## 2013-03-31 NOTE — Progress Notes (Signed)
Inpatient Diabetes Program Recommendations  AACE/ADA: New Consensus Statement on Inpatient Glycemic Control (2013)  Target Ranges:  Prepandial:   less than 140 mg/dL      Peak postprandial:   less than 180 mg/dL (1-2 hours)      Critically ill patients:  140 - 180 mg/dL   DKA management: Pt still acidotic with C02 of 17 at 0600 this am. Assume when acidosis has cleared, pt will be given lantus at least 1-2 hrs prior to discontinuation of IV insulin Noted pt takes lantus 45 units at home (with a weight of 49 kg which is rather high dose in addition to Novolog 15 units tidwc. Would recommend ordering 20-25 units lantus before drip is di/c'd and a sensitive correction to begin at the same moment the drip is d/c'd. Will talk with patient regarding the documented home insulin regimen and her elevated A1C of 10% Thank you, Lenor Coffin, RN, CNS, Diabetes Coordinator [phone])  Thank you, Lenor Coffin, RN, CNS, Diabetes Coordinator [phone])

## 2013-04-01 ENCOUNTER — Inpatient Hospital Stay (HOSPITAL_COMMUNITY): Payer: BC Managed Care – PPO

## 2013-04-01 LAB — BASIC METABOLIC PANEL
CO2: 23 mEq/L (ref 19–32)
GFR calc non Af Amer: >90 mL/min (ref >90)
Glucose, Bld: 196 mg/dL — ABNORMAL HIGH (ref 70–99)
Potassium: 3.4 mEq/L — ABNORMAL LOW (ref 3.5–5.1)
Sodium: 137 mEq/L (ref 135–145)

## 2013-04-01 LAB — CBC
Hemoglobin: 10 g/dL — ABNORMAL LOW (ref 12.0–15.0)
MCH: 30.8 pg (ref 26.0–34.0)
MCV: 88.6 fL (ref 78.0–100.0)
RBC: 3.25 MIL/uL — ABNORMAL LOW (ref 3.87–5.11)
WBC: 10.1 10*3/uL (ref 4.0–10.5)

## 2013-04-01 LAB — LIPASE, BLOOD: Lipase: 8 U/L — ABNORMAL LOW (ref 11–59)

## 2013-04-01 LAB — GLUCOSE, CAPILLARY: Glucose-Capillary: 136 mg/dL — ABNORMAL HIGH (ref 70–99)

## 2013-04-01 MED ORDER — POTASSIUM CHLORIDE CRYS ER 20 MEQ PO TBCR
20.0000 meq | EXTENDED_RELEASE_TABLET | ORAL | Status: AC
  Administered 2013-04-01 (×2): 20 meq via ORAL
  Filled 2013-04-01 (×2): qty 1

## 2013-04-01 MED FILL — Morphine Sulfate Inj PF 0.5 MG/ML: INTRAMUSCULAR | Qty: 2 | Status: AC

## 2013-04-01 NOTE — Progress Notes (Signed)
PULMONARY  / CRITICAL CARE MEDICINE  Name: Linda Walls MRN: [id number] DOB: May 23, 1991    ADMISSION DATE:  03/30/2013 CONSULTATION DATE:  03/30/2013  REFERRING MD :  Shelly Rubenstein PRIMARY SERVICE: PCCM  CHIEF COMPLAINT:  DKA  BRIEF PATIENT DESCRIPTION: 22 y/o female was admitted on 10/1 with DKA from [facility].  SIGNIFICANT EVENTS: 10/1 Transfer from [facility] to [facility] ICU 10/2 Off insulin gtt 10/3 Transfer to medical floor  STUDIES:  10/3 CT abd/pelvis  LINES / TUBES: 10/1 R IJ CVL >> 10/3  CULTURES: None  ANTIBIOTICS: None  SUBJECTIVE:  Off insulin gtt, tolerating diet but c/o abd discomfort and nausea after eating.  Denied vomiting, chest pain, dyspnea, or diarrhea.  VITAL SIGNS: Temp:  [97.9 F (36.6 C)-98.9 F (37.2 C)] 98 F (36.7 C) (10/03 0800) Resp:  [16] 16 (10/03 0800) BP: (108-122)/(56-69) 108/59 mmHg (10/03 0435) SpO2:  [95 %-98 %] 97 % (10/03 0800) Room air  INTAKE / OUTPUT: Intake/Output     10/02 0701 - 10/03 0700 10/03 0701 - 10/04 0700   P.O. 480    I.V. (mL/kg) 2042.7 (40.9)    IV Piggyback     Total Intake(mL/kg) 2522.7 (50.6)    Urine (mL/kg/hr) 1900 (1.6)    Total Output 1900     Net +622.7          Urine Occurrence 3 x      PHYSICAL EXAMINATION:  Gen: Pleasant, no distress HEENT: no sinus tenderness PULM: no wheeze/rales CV: regular, no murmur AB: BS+, soft, mildly tender epigastrum Ext: no edema Derm: no rash or skin breakdown Neuro: normal strength   LABS:  CBC Recent Labs     03/30/13  1424  03/30/13  2320  04/01/13  0430  WBC  36.4*  26.7*  10.1  HGB  15.3*  10.4*  10.0*  HCT  49.1*  30.2*  28.8*  PLT  717*  363  217   BMET Recent Labs     03/31/13  1220  03/31/13  1500  04/01/13  0500  NA  133*  136  137  K  3.8  3.2*  3.4*  CL  104  107  104  CO2  19  21  23   BUN  10  7  5*  CREATININE  0.48*  0.44*  0.44*  GLUCOSE  201*  134*  196*   Electrolytes Recent Labs     03/31/13  1220  03/31/13  1500   04/01/13  0500  CALCIUM  8.4  8.2*  8.5   Liver Enzymes Recent Labs     03/30/13  1424  AST  49*  ALT  30  ALKPHOS  132*  BILITOT  0.3  ALBUMIN  5.7*   Glucose Recent Labs     03/31/13  1655  03/31/13  1757  03/31/13  1850  03/31/13  1958  03/31/13  2113  04/01/13  0935  GLUCAP  126*  130*  196*  191*  255*  188*    ASSESSMENT / PLAN:  A: 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 P: -continue subcutaneous insulin -carb modified diet -prn zofran for nausea -f/u CT abd/pelvis 10/3 -f/u lipase 10/4  A:  Sinus tach from DKA/volume depletion >> resolved P:  -even fluid balance -d/c telemetry 10/3  A:   Hypokalemia in setting of DKA >> improved. Lactic acidosis on admission. P:   -f/u and replace electrolytes as needed  A:  Anemia of critical illness. Leukocytosis from acute stress.  P:  -f/u CBC -lovenox for DVT prevention  Updated pt's family at bedside.  Transfer to non tele floor bed.  D/c CVL.  F/u CT abd/pelvis.  Transfer service to [facility] for 10/04 and PCCM sign off.  Coralyn Helling, MD [facility] Pulmonary/Critical Care 04/01/2013, 11:15 AM Pager:  [phone] After 3pm call: [phone]

## 2013-04-01 NOTE — Progress Notes (Signed)
Report to [facility] RN 5N; pt transferred via wheelchair

## 2013-04-01 NOTE — Progress Notes (Signed)
[facility] ADULT ICU REPLACEMENT PROTOCOL FOR AM LAB REPLACEMENT ONLY  The patient does apply for the [facility] Adult ICU Electrolyte Replacment Protocol based on the criteria listed below:   1. Is GFR >/= 40 ml/min? yes  Patient's GFR today is >90 2. Is urine output >/= 0.5 ml/kg/hr for the last 6 hours? yes Patient's UOP is 2.3 ml/kg/hr 3. Is BUN < 60 mg/dL? yes  Patient's BUN today is 5 4. Abnormal electrolyte(s)Potassium 5. Ordered repletion with: Potassium per Protocol 6. If a panic level lab has been reported, has the CCM MD in charge been notified? no.   Physician:    Orland Dec P 04/01/2013 5:34 AM

## 2013-04-01 NOTE — Progress Notes (Signed)
Inpatient Diabetes Program Recommendations  AACE/ADA: New Consensus Statement on Inpatient Glycemic Control (2013)  Target Ranges:  Prepandial:   less than 140 mg/dL      Peak postprandial:   less than 180 mg/dL (1-2 hours)      Critically ill patients:  140 - 180 mg/dL   Reason for Visit: Hyperglycemia  Results for LARENDA, REEDY (MRN [id number]) as of 04/01/2013 16:39  Ref. Range 03/31/2013 17:57 03/31/2013 18:50 03/31/2013 19:58 03/31/2013 21:13 04/01/2013 09:35  Glucose-Capillary Latest Range: 70-99 mg/dL 782 (H) 956 (H) 213 (H) 255 (H) 188 (H)  Results for GABRIELA, IRIGOYEN (MRN [id number]) as of 04/01/2013 16:39  Ref. Range 04/01/2013 05:00  Sodium Latest Range: 135-145 mEq/L 137  Potassium Latest Range: 3.5-5.1 mEq/L 3.4 (L)  Chloride Latest Range: 96-112 mEq/L 104  CO2 Latest Range: 19-32 mEq/L 23  BUN Latest Range: 6-23 mg/dL 5 (L)  Creatinine Latest Range: 0.50-1.10 mg/dL 6.29 (L)  Calcium Latest Range: 8.4-10.5 mg/dL 8.5  GFR calc non Af Amer Latest Range: >90 mL/min >90  GFR calc Af Amer Latest Range: >90 mL/min >90  Glucose Latest Range: 70-99 mg/dL 528 (H)    Inpatient Diabetes Program Recommendations Insulin - Basal: Increase Lantus to 40 units QHS Correction (SSI): Decrease Novolog to sensitive tidwc and hs Insulin - Meal Coverage: Add Novolog 6 units tidwc for meal coverage insulin if pt eats >50% meal  Note: Will continue to follow. Thank you. Ailene Ards, RD, LDN, CDE Inpatient Diabetes Coordinator [phone]

## 2013-04-02 ENCOUNTER — Inpatient Hospital Stay (HOSPITAL_COMMUNITY): Payer: BC Managed Care – PPO

## 2013-04-02 ENCOUNTER — Encounter (HOSPITAL_COMMUNITY): Payer: Self-pay | Admitting: General Surgery

## 2013-04-02 DIAGNOSIS — R109 Unspecified abdominal pain: Secondary | ICD-10-CM

## 2013-04-02 DIAGNOSIS — K8020 Calculus of gallbladder without cholecystitis without obstruction: Secondary | ICD-10-CM

## 2013-04-02 LAB — BASIC METABOLIC PANEL
BUN: 8 mg/dL (ref 6–23)
CO2: 24 mEq/L (ref 19–32)
Calcium: 8.8 mg/dL (ref 8.4–10.5)
Chloride: 108 mEq/L (ref 96–112)
Creatinine, Ser: 0.42 mg/dL — ABNORMAL LOW (ref 0.50–1.10)
GFR calc non Af Amer: >90 mL/min (ref >90)

## 2013-04-02 LAB — GLUCOSE, CAPILLARY
Glucose-Capillary: 80 mg/dL (ref 70–99)
Glucose-Capillary: 100 mg/dL — ABNORMAL HIGH (ref 70–99)
Glucose-Capillary: 111 mg/dL — ABNORMAL HIGH (ref 70–99)

## 2013-04-02 LAB — CBC
HCT: 32.9 % — ABNORMAL LOW (ref 36.0–46.0)
Hemoglobin: 11.3 g/dL — ABNORMAL LOW (ref 12.0–15.0)
MCH: 30.6 pg (ref 26.0–34.0)
MCHC: 34.3 g/dL (ref 30.0–36.0)
MCV: 89.2 fL (ref 78.0–100.0)
Platelets: 259 K/uL (ref 150–400)
RBC: 3.69 MIL/uL — ABNORMAL LOW (ref 3.87–5.11)
RDW: 13 % (ref 11.5–15.5)
WBC: 6.4 K/uL (ref 4.0–10.5)

## 2013-04-02 LAB — HEPATIC FUNCTION PANEL
ALT: 22 U/L (ref 0–35)
AST: 35 U/L (ref 0–37)
Albumin: 3.3 g/dL — ABNORMAL LOW (ref 3.5–5.2)
Alkaline Phosphatase: 75 U/L (ref 39–117)
Total Bilirubin: 0.2 mg/dL — ABNORMAL LOW (ref 0.3–1.2)
Total Protein: 6.3 g/dL (ref 6.0–8.3)

## 2013-04-02 LAB — LACTIC ACID, PLASMA: Lactic Acid, Venous: 0.8 mmol/L (ref 0.5–2.2)

## 2013-04-02 LAB — PHOSPHORUS: Phosphorus: 2.7 mg/dL (ref 2.3–4.6)

## 2013-04-02 LAB — MAGNESIUM: Magnesium: 2.1 mg/dL (ref 1.5–2.5)

## 2013-04-02 MED ORDER — INSULIN GLARGINE 100 UNIT/ML ~~LOC~~ SOLN: 35.0000 [IU] | Freq: Every evening | SUBCUTANEOUS | Status: DC

## 2013-04-02 MED ORDER — INSULIN ASPART 100 UNIT/ML ~~LOC~~ SOLN: 6.0000 [IU] | Freq: Three times a day (TID) | SUBCUTANEOUS | Status: DC

## 2013-04-02 MED ORDER — OXYCODONE-ACETAMINOPHEN 7.5-325 MG PO TABS: 1.0000 | ORAL_TABLET | Freq: Four times a day (QID) | ORAL | Status: DC | PRN

## 2013-04-02 NOTE — Progress Notes (Signed)
Patient discharge home accompanied by parents. Left arm d/c. Right neck dressing removed and band-aid applied. Discharge instructions reviewed with patient. Understanding stated. Will call for follow up appointment.

## 2013-04-02 NOTE — Consult Note (Signed)
Reason for Consult:gallstones Referring Physician: Dr. Hartley Barefoot   HPI: Linda Walls is a 22 year old female with a history of hereditary pancreatitis s/p stent placement 8 years ago at [facility], pancreatic cyst drainage at age 51, hernia repair at age 18, IBS, GERD and diabetes mellitus secondary to recurrent pancreatitis.  She was admitted on 03/30/13 to the ICU due to DKA. The patient states she began vomiting on Monday associated with LUQ abdominal pain.  Glucose was found to be >600, she was started on a insulin gtt, IV hydration.  Lipase on admission was 24.  She also had leukocytosis.  She was started on a diet as nausea improved.  CBGs improved.  Due to the abdominal pain she underwent a CT of abdomen and pelvis which showed sludge and stones and therefore surgery was consulted. The patient denies RUQ pain at this time.  She complains of mild pain to the LUQ.  She denies nausea.  Currently, she denies fever, chills or sweats.  She reports temp 99.5 and chills prior to admission.  Furthermore, she reports intermittent RUQ abdominal pain which occurs once per month after meals.  This resolves with time within an hour.  She denies melena, hematochezia.  Normal BMs.  UA with micro negative for infection.  Leukocytosis has resolved.  She has been on a carb modified diet and tolerating well.  Mother and father at bedside.  They state the patient is in process of seeing specialist with [facility].    Past Medical History  Diagnosis Date  . Hereditary pancreatitis   . DM type 1 (diabetes mellitus, type 1)   . Hernia   . GERD (gastroesophageal reflux disease)   . Jaundice     Past Surgical History  Procedure Laterality Date  . Hernia repair      As infant.  . Bile duct stent placement  2006 at [facility]  . Pancreatic cyst drainage  at age 24 year    Family History  Problem Relation Age of Onset  . Pancreatitis Father 30    hereditary  . Hypertension Father   . GER disease Father    . Breast cancer Maternal Aunt   . Congenital heart disease Maternal Grandfather   . Prostate cancer Maternal Grandfather     Social History:  reports that she has never smoked. She has never used smokeless tobacco. She reports that she does not drink alcohol or use illicit drugs.  Allergies:  Allergies  Allergen Reactions  . Cefzil [Cefprozil]     Sickness  . Shellfish Allergy Nausea And Vomiting  . Sulfa Antibiotics Rash    Medications: {medication reviewed  Results for orders placed during the hospital encounter of 03/30/13 (from the past 48 hour(s))  GLUCOSE, CAPILLARY     Status: Abnormal   Collection Time    03/31/13 11:06 AM      Result Value Range   Glucose-Capillary 185 (*) 70 - 99 mg/dL  GLUCOSE, CAPILLARY     Status: Abnormal   Collection Time    03/31/13 12:11 PM      Result Value Range   Glucose-Capillary 201 (*) 70 - 99 mg/dL  BASIC METABOLIC PANEL     Status: Abnormal   Collection Time    03/31/13 12:20 PM      Result Value Range   Sodium 133 (*) 135 - 145 mEq/L   Potassium 3.8  3.5 - 5.1 mEq/L   Chloride 104  96 - 112 mEq/L   CO2 19  19 - 32 mEq/L   Glucose, Bld 201 (*) 70 - 99 mg/dL   BUN 10  6 - 23 mg/dL   Creatinine, Ser 2.13 (*) 0.50 - 1.10 mg/dL   Calcium 8.4  8.4 - 08.6 mg/dL   GFR calc non Af Amer >90  >90 mL/min   GFR calc Af Amer >90  >90 mL/min   Comment: (NOTE)     The eGFR has been calculated using the CKD EPI equation.     This calculation has not been validated in all clinical situations.     eGFR's persistently <90 mL/min signify possible Chronic Kidney     Disease.  GLUCOSE, CAPILLARY     Status: Abnormal   Collection Time    03/31/13  1:06 PM      Result Value Range   Glucose-Capillary 137 (*) 70 - 99 mg/dL  GLUCOSE, CAPILLARY     Status: Abnormal   Collection Time    03/31/13  2:10 PM      Result Value Range   Glucose-Capillary 176 (*) 70 - 99 mg/dL  BASIC METABOLIC PANEL     Status: Abnormal   Collection Time    03/31/13   3:00 PM      Result Value Range   Sodium 136  135 - 145 mEq/L   Potassium 3.2 (*) 3.5 - 5.1 mEq/L   Chloride 107  96 - 112 mEq/L   CO2 21  19 - 32 mEq/L   Glucose, Bld 134 (*) 70 - 99 mg/dL   BUN 7  6 - 23 mg/dL   Creatinine, Ser 5.78 (*) 0.50 - 1.10 mg/dL   Calcium 8.2 (*) 8.4 - 10.5 mg/dL   GFR calc non Af Amer >90  >90 mL/min   GFR calc Af Amer >90  >90 mL/min   Comment: (NOTE)     The eGFR has been calculated using the CKD EPI equation.     This calculation has not been validated in all clinical situations.     eGFR's persistently <90 mL/min signify possible Chronic Kidney     Disease.  GLUCOSE, CAPILLARY     Status: Abnormal   Collection Time    03/31/13  3:09 PM      Result Value Range   Glucose-Capillary 145 (*) 70 - 99 mg/dL  GLUCOSE, CAPILLARY     Status: Abnormal   Collection Time    03/31/13  3:52 PM      Result Value Range   Glucose-Capillary 128 (*) 70 - 99 mg/dL  GLUCOSE, CAPILLARY     Status: Abnormal   Collection Time    03/31/13  4:55 PM      Result Value Range   Glucose-Capillary 126 (*) 70 - 99 mg/dL  GLUCOSE, CAPILLARY     Status: Abnormal   Collection Time    03/31/13  5:57 PM      Result Value Range   Glucose-Capillary 130 (*) 70 - 99 mg/dL  GLUCOSE, CAPILLARY     Status: Abnormal   Collection Time    03/31/13  6:50 PM      Result Value Range   Glucose-Capillary 196 (*) 70 - 99 mg/dL  GLUCOSE, CAPILLARY     Status: Abnormal   Collection Time    03/31/13  7:58 PM      Result Value Range   Glucose-Capillary 191 (*) 70 - 99 mg/dL   Comment 1 Notify RN     Comment 2 Documented in Chart  GLUCOSE, CAPILLARY     Status: Abnormal   Collection Time    03/31/13  9:13 PM      Result Value Range   Glucose-Capillary 255 (*) 70 - 99 mg/dL  CBC     Status: Abnormal   Collection Time    04/01/13  4:30 AM      Result Value Range   WBC 10.1  4.0 - 10.5 K/uL   RBC 3.25 (*) 3.87 - 5.11 MIL/uL   Hemoglobin 10.0 (*) 12.0 - 15.0 g/dL   HCT 14.7 (*) 82.9 -  46.0 %   MCV 88.6  78.0 - 100.0 fL   MCH 30.8  26.0 - 34.0 pg   MCHC 34.7  30.0 - 36.0 g/dL   RDW 56.2  13.0 - 86.5 %   Platelets 217  150 - 400 K/uL   Comment: SPECIMEN CHECKED FOR CLOTS     DELTA CHECK NOTED  BASIC METABOLIC PANEL     Status: Abnormal   Collection Time    04/01/13  5:00 AM      Result Value Range   Sodium 137  135 - 145 mEq/L   Potassium 3.4 (*) 3.5 - 5.1 mEq/L   Chloride 104  96 - 112 mEq/L   CO2 23  19 - 32 mEq/L   Glucose, Bld 196 (*) 70 - 99 mg/dL   BUN 5 (*) 6 - 23 mg/dL   Creatinine, Ser 7.84 (*) 0.50 - 1.10 mg/dL   Calcium 8.5  8.4 - 69.6 mg/dL   GFR calc non Af Amer >90  >90 mL/min   GFR calc Af Amer >90  >90 mL/min   Comment: (NOTE)     The eGFR has been calculated using the CKD EPI equation.     This calculation has not been validated in all clinical situations.     eGFR's persistently <90 mL/min signify possible Chronic Kidney     Disease.  GLUCOSE, CAPILLARY     Status: Abnormal   Collection Time    04/01/13  9:35 AM      Result Value Range   Glucose-Capillary 188 (*) 70 - 99 mg/dL  LIPASE, BLOOD     Status: Abnormal   Collection Time    04/01/13 11:31 AM      Result Value Range   Lipase 8 (*) 11 - 59 U/L  GLUCOSE, CAPILLARY     Status: Abnormal   Collection Time    04/01/13  1:03 PM      Result Value Range   Glucose-Capillary 136 (*) 70 - 99 mg/dL  GLUCOSE, CAPILLARY     Status: Abnormal   Collection Time    04/01/13  5:16 PM      Result Value Range   Glucose-Capillary 321 (*) 70 - 99 mg/dL  GLUCOSE, CAPILLARY     Status: Abnormal   Collection Time    04/01/13  9:39 PM      Result Value Range   Glucose-Capillary 136 (*) 70 - 99 mg/dL  GLUCOSE, CAPILLARY     Status: None   Collection Time    04/02/13  6:30 AM      Result Value Range   Glucose-Capillary 80  70 - 99 mg/dL  BASIC METABOLIC PANEL     Status: Abnormal   Collection Time    04/02/13  6:45 AM      Result Value Range   Sodium 144  135 - 145 mEq/L   Potassium 3.6  3.5  - 5.1  mEq/L   Chloride 108  96 - 112 mEq/L   CO2 24  19 - 32 mEq/L   Glucose, Bld 75  70 - 99 mg/dL   BUN 8  6 - 23 mg/dL   Creatinine, Ser 1.61 (*) 0.50 - 1.10 mg/dL   Calcium 8.8  8.4 - 09.6 mg/dL   GFR calc non Af Amer >90  >90 mL/min   GFR calc Af Amer >90  >90 mL/min   Comment: (NOTE)     The eGFR has been calculated using the CKD EPI equation.     This calculation has not been validated in all clinical situations.     eGFR's persistently <90 mL/min signify possible Chronic Kidney     Disease.  CBC     Status: Abnormal   Collection Time    04/02/13  6:45 AM      Result Value Range   WBC 6.4  4.0 - 10.5 K/uL   RBC 3.69 (*) 3.87 - 5.11 MIL/uL   Hemoglobin 11.3 (*) 12.0 - 15.0 g/dL   HCT 04.5 (*) 40.9 - 81.1 %   MCV 89.2  78.0 - 100.0 fL   MCH 30.6  26.0 - 34.0 pg   MCHC 34.3  30.0 - 36.0 g/dL   RDW 91.4  78.2 - 95.6 %   Platelets 259  150 - 400 K/uL  MAGNESIUM     Status: None   Collection Time    04/02/13  6:45 AM      Result Value Range   Magnesium 2.1  1.5 - 2.5 mg/dL  PHOSPHORUS     Status: None   Collection Time    04/02/13  6:45 AM      Result Value Range   Phosphorus 2.7  2.3 - 4.6 mg/dL  LACTIC ACID, PLASMA     Status: None   Collection Time    04/02/13  6:45 AM      Result Value Range   Lactic Acid, Venous 0.8  0.5 - 2.2 mmol/L  GLUCOSE, CAPILLARY     Status: Abnormal   Collection Time    04/02/13  9:04 AM      Result Value Range   Glucose-Capillary 100 (*) 70 - 99 mg/dL    Ct Abdomen Pelvis Wo Contrast  04/01/2013   CLINICAL DATA:  22 year old with history of DKA and abdominal pain.  EXAM: CT ABDOMEN AND PELVIS WITHOUT CONTRAST  TECHNIQUE: Multidetector CT imaging of the abdomen and pelvis was performed following the standard protocol without intravenous contrast.  COMPARISON:  Abdominal CT 09/25/2004  FINDINGS: Lung bases are clear. Unenhanced CT was performed per clinician order. Lack of IV contrast limits sensitivity and specificity, especially for  evaluation of abdominal/pelvic solid viscera. There is no evidence for free air. Small amount of subcutaneous gas in the left lower anterior abdomen. This is probably related to an injection site.  Normal appearance of the liver. There appears to be layering sludge or stones in the gallbladder. Normal appearance of the spleen. Difficult to evaluate the pancreas on this non contrast examination. There is no gross abnormality but there is dilatation of the main pancreatic duct which was present on the previous examination. No gross abnormality to the adrenal glands or kidneys. There is free fluid in the pelvis which may be physiologic. Limited evaluation of the uterus and adnexal structures. Fluid in the urinary bladder. No gross abnormality to the small or large bowel. There is a small amount of contrast with in the  bowel. No acute bone abnormality.  IMPRESSION: Layering sludge or stones in the gallbladder.  Limited evaluation of the pancreas on this non contrast examination. Again noted is dilatation the main pancreatic duct which was present on the previous examination.  Free fluid in the pelvis that may be physiologic in a patient of this age.   Electronically Signed   By: Richarda Overlie M.D.   On: 04/01/2013 14:00    Review of Systems  Constitutional: Negative for fever and chills.  Respiratory: Negative for shortness of breath.   Cardiovascular: Negative for chest pain and palpitations.  Gastrointestinal: Positive for abdominal pain. Negative for nausea, vomiting, diarrhea, constipation, blood in stool and melena.  Genitourinary: Negative for dysuria.  Neurological: Positive for headaches. Negative for loss of consciousness.   Blood pressure 113/56, pulse 67, temperature 98.7 F (37.1 C), temperature source Oral, resp. rate 18, height 5\' 2"  (1.575 m), weight 110 lb (49.896 kg), last menstrual period 01/30/2013, SpO2 99.00%. Physical Exam  Constitutional: She appears well-developed and well-nourished.  No distress.  Neck: Normal range of motion. Neck supple.  Cardiovascular: Normal rate, regular rhythm, normal heart sounds and intact distal pulses.  Exam reveals no gallop and no friction rub.   No murmur heard. GI: Soft. Bowel sounds are normal. She exhibits no distension and no mass. There is no rebound and no guarding.  Negative Murphy's sign.  Minimal epigastric tenderness  Skin: She is not diaphoretic.    Assessment/Plan: LUQ abdominal pain Gallstones  Plan: Given her history of hereditary pancreatitis, stent placement, pancreatic cysts and lack of physical findings to suggest acute cholecystitis surgery is likely not indicated.  She is scheduled for an ultrasound today, but not sure if this is gong to change our recommendations.  She seemed to be tolerating her diet and recommend continuing.  She will be going to [facility] which I think is very appropriate in this setting.  Dr. Doreen Salvage note and final recommendations to follow.    Ashok Norris ANP-BC Pager [phone]  04/02/2013, 10:23 AM

## 2013-04-02 NOTE — Discharge Summary (Signed)
Physician Discharge Summary  Linda Walls ZOX:[id number] DOB: 11-16-1990 DOA: 03/30/2013  PCP: Linda Asa, MD  Admit date: 03/30/2013 Discharge date: 04/02/2013  Time spent: 35 minutes  Recommendations for Outpatient Follow-up:  1. Follow up with PCP for further dose adjustment of insulin.  2. Follow up with tertiary center for further evaluatation of hereditary pancreatitis and cholelithiasis.   Discharge Diagnoses:    DKA (diabetic ketoacidoses)   DM type 1 (diabetes mellitus, type 1)   Hereditary pancreatitis   Hypokalemia    Discharge Condition: Stable  Diet recommendation: Carb modified.   Filed Weights   03/30/13 1359  Weight: 49.896 kg (110 lb)    History of present illness:  22 y/o female was admitted on 10/1 with DKA from [facility]. She stated that she had been having abdominal pain consistent with her prior episodes of pancreatitis for two weeks prior to admission. Three days prior to admission she developed nausea and vomiting. One day prior to admission she was unable to keep anything down. She kept taking her glargine insulin through the nausea and vomiting (last dose 9/30 PM), but on did not take novolog insulin on 10/1. She was found to be in DKA on 10/1 and was admitted to the [facility] ICU on request from the [facility] service.    Hospital Course:  1-Diabetes, DKA: patient off insulin gtt. Continue with lantus and SSI. Gap resolved. Patient tolerating diet. Will discharge on lantus 25 units, 6 units meal coverage. Patient aware that she needs to follow up with PCP for further adjustment.   2-History of hereditary pancreatitis; Patient was diagnose when she was 65 y [name]. 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 She had stent placement at [facility]. She last follow up at [facility] 4 years ago with Dr Linda Walls.  -She relates mild worsening pain specially when she eats. She has epigastric and upper quadrant abdominal pain.  -Lipase during this admission has  been negative.  -CT abdomen show pancreatic duct dilation. Question of gallbladder sludge or stone.  -Surgery cancelled Abdominal US. Dr Dwain Sarna doesn't think current pain is related to gallbladder problems. Patient will need cholecystectomy at some point. patient and family advised to follow up with tertiary center.   -Patient tolerating diet. No significant abdominal pain. Will give prescription for percocet.   3-Anemia: monitor hb. Might need work up for this outpatient.  4-Hypokalemia: resolved.  5-DVT prophylaxis: Lovenox.    Procedures:  none  Consultations:  Surgery.   Discharge Exam: Filed Vitals:   04/02/13 0500  BP: 113/56  Pulse: 67  Temp: 98.7 F (37.1 C)  Resp: 18    General: No distress.  Cardiovascular: S 1, S 2 RRR Respiratory: CTA Abdomen: soft, NR, mild tenderness.   Discharge Instructions  Discharge Orders   Future Orders Complete By Expires   Diet Carb Modified  As directed    Increase activity slowly  As directed        Medication List         hyoscyamine 0.125 MG SL tablet  Commonly known as:  LEVSIN SL  Place 0.125 mg under the tongue every 4 (four) hours as needed for cramping or diarrhea or loose stools.     ibuprofen 200 MG tablet  Commonly known as:  ADVIL,MOTRIN  Take 400 mg by mouth every 6 (six) hours as needed. pain     insulin aspart 100 UNIT/ML injection  Commonly known as:  novoLOG  Inject 6 Units into the  skin 3 (three) times daily before meals. *Increase subcutaneous injection by 1 unit for every 50 units over 150.*     insulin glargine 100 UNIT/ML injection  Commonly known as:  LANTUS  Inject 0.35 mLs (35 Units total) into the skin every evening.     multivitamins ther. w/minerals Tabs tablet  Take 1 tablet by mouth daily. For energy     omeprazole 20 MG capsule  Commonly known as:  PRILOSEC  Take 20 mg by mouth daily.     oxyCODONE-acetaminophen 7.5-325 MG per tablet  Commonly known as:  PERCOCET  Take 1  tablet by mouth every 6 (six) hours as needed. For pain     Pancrelipase (Lip-Prot-Amyl) 6000 UNITS Cpep  Take 2 capsules (12,000 Units total) by mouth 3 (three) times daily before meals.     promethazine 25 MG tablet  Commonly known as:  PHENERGAN  Take 1 tablet (25 mg total) by mouth every 6 (six) hours as needed for nausea.     promethazine 25 MG suppository  Commonly known as:  PHENERGAN  Place 25 mg rectally every 6 (six) hours as needed for nausea.     Vitamin D (Ergocalciferol) 50000 UNITS Caps capsule  Commonly known as:  DRISDOL  Take 50,000 Units by mouth every 7 (seven) days. Takes on Wednesdays.       Allergies  Allergen Reactions  . Cefzil [Cefprozil]     Sickness  . Shellfish Allergy Nausea And Vomiting  . Sulfa Antibiotics Rash       Follow-up Information   Follow up with Linda Asa, MD In 1 week.   Specialty:  Family Medicine   Contact information:   [address] [place] [address] [phone]        The results of significant diagnostics from this hospitalization (including imaging, microbiology, ancillary and laboratory) are listed below for reference.    Significant Diagnostic Studies: Ct Abdomen Pelvis Wo Contrast  04/01/2013   CLINICAL DATA:  22 year old with history of DKA and abdominal pain.  EXAM: CT ABDOMEN AND PELVIS WITHOUT CONTRAST  TECHNIQUE: Multidetector CT imaging of the abdomen and pelvis was performed following the standard protocol without intravenous contrast.  COMPARISON:  Abdominal CT 09/25/2004  FINDINGS: Lung bases are clear. Unenhanced CT was performed per clinician order. Lack of IV contrast limits sensitivity and specificity, especially for evaluation of abdominal/pelvic solid viscera. There is no evidence for free air. Small amount of subcutaneous gas in the left lower anterior abdomen. This is probably related to an injection site.  Normal appearance of the liver. There appears to be layering sludge or stones in  the gallbladder. Normal appearance of the spleen. Difficult to evaluate the pancreas on this non contrast examination. There is no gross abnormality but there is dilatation of the main pancreatic duct which was present on the previous examination. No gross abnormality to the adrenal glands or kidneys. There is free fluid in the pelvis which may be physiologic. Limited evaluation of the uterus and adnexal structures. Fluid in the urinary bladder. No gross abnormality to the small or large bowel. There is a small amount of contrast with in the bowel. No acute bone abnormality.  IMPRESSION: Layering sludge or stones in the gallbladder.  Limited evaluation of the pancreas on this non contrast examination. Again noted is dilatation the main pancreatic duct which was present on the previous examination.  Free fluid in the pelvis that may be physiologic in a patient of this age.  Electronically Signed   By: Richarda Overlie M.D.   On: 04/01/2013 14:00   Dg Chest Portable 1 View  03/30/2013   CLINICAL DATA:  Central catheter placement  EXAM: PORTABLE CHEST - 1 VIEW  COMPARISON:  May 02, 2012  FINDINGS: Central catheter tip is at the cavoatrial junction. No pneumothorax. Lungs are clear. Heart size and pulmonary vascularity are normal. No adenopathy. No bone lesions.  IMPRESSION: Central catheter tip at cavoatrial junction. No pneumothorax. Lungs clear.   Electronically Signed   By: Bretta Bang   On: 03/30/2013 18:10    Microbiology: Recent Results (from the past 240 hour(s))  MRSA PCR SCREENING     Status: None   Collection Time    03/30/13 10:18 PM      Result Value Range Status   MRSA by PCR NEGATIVE  NEGATIVE Final   Comment:            The GeneXpert MRSA Assay (FDA     approved for NASAL specimens     only), is one component of a     comprehensive MRSA colonization     surveillance program. It is not     intended to diagnose MRSA     infection nor to guide or     monitor treatment for     MRSA  infections.     Labs: Basic Metabolic Panel:  Recent Labs Lab 03/31/13 0600 03/31/13 1220 03/31/13 1500 04/01/13 0500 04/02/13 0645  NA 138 133* 136 137 144  K 3.9 3.8 3.2* 3.4* 3.6  CL 107 104 107 104 108  CO2 17* 19 21 23 24   GLUCOSE 155* 201* 134* 196* 75  BUN 15 10 7  5* 8  CREATININE 0.62 0.48* 0.44* 0.44* 0.42*  CALCIUM 8.1* 8.4 8.2* 8.5 8.8  MG  --   --   --   --  2.1  PHOS  --   --   --   --  2.7   Liver Function Tests:  Recent Labs Lab 03/30/13 1424 04/02/13 0645  AST 49* 35  ALT 30 22  ALKPHOS 132* 75  BILITOT 0.3 0.2*  PROT 9.7* 6.3  ALBUMIN 5.7* 3.3*    Recent Labs Lab 03/30/13 1424 04/01/13 1131  LIPASE 24 8*   No results found for this basename: AMMONIA,  in the last 168 hours CBC:  Recent Labs Lab 03/30/13 1424 03/30/13 2320 04/01/13 0430 04/02/13 0645  WBC 36.4* 26.7* 10.1 6.4  NEUTROABS 31.7*  --   --   --   HGB 15.3* 10.4* 10.0* 11.3*  HCT 49.1* 30.2* 28.8* 32.9*  MCV 100.6* 88.8 88.6 89.2  PLT 717* 363 217 259   Cardiac Enzymes: No results found for this basename: CKTOTAL, CKMB, CKMBINDEX, TROPONINI,  in the last 168 hours BNP: BNP (last 3 results) No results found for this basename: PROBNP,  in the last 8760 hours CBG:  Recent Labs Lab 04/01/13 1716 04/01/13 2139 04/02/13 0630 04/02/13 0904 04/02/13 1123  GLUCAP 321* 136* 80 100* 111*       Signed:  Lestine Rahe  [facility] 04/02/2013, 12:35 PM

## 2013-04-02 NOTE — Consult Note (Signed)
She has had issues with pancreatitis which are hereditary.  Has been followed at [facility] before and she has been recommended by them to go to specialized center.  She does have gallstones and I think at some point cholecystectomy may be indicated.  This is not acute and I think she can pursue this appt first.  Discussed with patient and her family who are in agreement.  Will sign off

## 2013-04-02 NOTE — Progress Notes (Signed)
[facility] PROGRESS NOTE  Linda Walls RUE:[id number] DOB: April 29, 1991 DOA: 03/30/2013 PCP: [name], MD  Assessment/Plan: 1-Diabetes, DKA: patient off insulin gtt. Continue with lantus and SSI. B-met pending for this morning.   2-History of hereditary pancreatitis; Patient was diagnose when she was 29 y [name]. 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 She had stent placement at [facility]. She last follow up at [facility] 4 years ago with Dr Loleta Chance. -She relates mild worsening pain specially when she eats. She has epigastric and upper quadrant abdominal pain.  -Lipase during this admission has been negative.  -CT abdomen show pancreatic duct dilation. Question of gallbladder sludge or stone.  -[place] ordered. Surgery consulted.   3-Anemia: monitor hb. Might need work up for this outpatient.  4-Hypokalemia: electrolytes pending for this morning.  5-DVT prophylaxis: Lovenox.   Code Status: Full Family Communication: Care discussed with parents who were at bedside.  Disposition Plan:    Consultants:  Surgery  Procedures:  US abdomen: pending.   Antibiotics:  none  HPI/Subjective: She relates some improvement of abdominal pain. Tolerating diet. She feels her chronic abdominal pain is a little worse, specially with meals. Her abdominal pain is epigastric and upper quadrant right and left.   Objective: Filed Vitals:   04/02/13 0500  BP: 113/56  Pulse: 67  Temp: 98.7 F (37.1 C)  Resp: 18    Intake/Output Summary (Last 24 hours) at 04/02/13 0742 Last data filed at 04/02/13 0600  Gross per 24 hour  Intake   1040 ml  Output    150 ml  Net    890 ml   Filed Weights   03/30/13 1359  Weight: 49.896 kg (110 lb)    Exam:   General:  No distress.   Cardiovascular: S 1, S 2, RRR  Respiratory: CTA  Abdomen: Bs present, soft, mild RUQ tenderness. No rigidity.   Musculoskeletal:no edema.   Data Reviewed: Basic Metabolic  Panel:  Recent Labs Lab 03/31/13 0335 03/31/13 0600 03/31/13 1220 03/31/13 1500 04/01/13 0500  NA 138 138 133* 136 137  K 4.3 3.9 3.8 3.2* 3.4*  CL 108 107 104 107 104  CO2 15* 17* 19 21 23   GLUCOSE 174* 155* 201* 134* 196*  BUN 17 15 10 7  5*  CREATININE 0.69 0.62 0.48* 0.44* 0.44*  CALCIUM 8.0* 8.1* 8.4 8.2* 8.5   Liver Function Tests:  Recent Labs Lab 03/30/13 1424  AST 49*  ALT 30  ALKPHOS 132*  BILITOT 0.3  PROT 9.7*  ALBUMIN 5.7*    Recent Labs Lab 03/30/13 1424 04/01/13 1131  LIPASE 24 8*   No results found for this basename: AMMONIA,  in the last 168 hours CBC:  Recent Labs Lab 03/30/13 1424 03/30/13 2320 04/01/13 0430 04/02/13 0645  WBC 36.4* 26.7* 10.1 6.4  NEUTROABS 31.7*  --   --   --   HGB 15.3* 10.4* 10.0* 11.3*  HCT 49.1* 30.2* 28.8* 32.9*  MCV 100.6* 88.8 88.6 89.2  PLT 717* 363 217 259   Cardiac Enzymes: No results found for this basename: CKTOTAL, CKMB, CKMBINDEX, TROPONINI,  in the last 168 hours BNP (last 3 results) No results found for this basename: PROBNP,  in the last 8760 hours CBG:  Recent Labs Lab 04/01/13 0935 04/01/13 1303 04/01/13 1716 04/01/13 2139 04/02/13 0630  GLUCAP 188* 136* 321* 136* 80    Recent Results (from the past 240 hour(s))  MRSA PCR SCREENING     Status:  None   Collection Time    03/30/13 10:18 PM      Result Value Range Status   MRSA by PCR NEGATIVE  NEGATIVE Final   Comment:            The GeneXpert MRSA Assay (FDA     approved for NASAL specimens     only), is one component of a     comprehensive MRSA colonization     surveillance program. It is not     intended to diagnose MRSA     infection nor to guide or     monitor treatment for     MRSA infections.     Studies: Ct Abdomen Pelvis Wo Contrast  04/01/2013   CLINICAL DATA:  22 year old with history of DKA and abdominal pain.  EXAM: CT ABDOMEN AND PELVIS WITHOUT CONTRAST  TECHNIQUE: Multidetector CT imaging of the abdomen and  pelvis was performed following the standard protocol without intravenous contrast.  COMPARISON:  Abdominal CT 09/25/2004  FINDINGS: Lung bases are clear. Unenhanced CT was performed per clinician order. Lack of IV contrast limits sensitivity and specificity, especially for evaluation of abdominal/pelvic solid viscera. There is no evidence for free air. Small amount of subcutaneous gas in the left lower anterior abdomen. This is probably related to an injection site.  Normal appearance of the liver. There appears to be layering sludge or stones in the gallbladder. Normal appearance of the spleen. Difficult to evaluate the pancreas on this non contrast examination. There is no gross abnormality but there is dilatation of the main pancreatic duct which was present on the previous examination. No gross abnormality to the adrenal glands or kidneys. There is free fluid in the pelvis which may be physiologic. Limited evaluation of the uterus and adnexal structures. Fluid in the urinary bladder. No gross abnormality to the small or large bowel. There is a small amount of contrast with in the bowel. No acute bone abnormality.  IMPRESSION: Layering sludge or stones in the gallbladder.  Limited evaluation of the pancreas on this non contrast examination. Again noted is dilatation the main pancreatic duct which was present on the previous examination.  Free fluid in the pelvis that may be physiologic in a patient of this age.   Electronically Signed   By: Richarda Overlie M.D.   On: 04/01/2013 14:00    Scheduled Meds: . enoxaparin (LOVENOX) injection  40 mg Subcutaneous Q24H  . feeding supplement  1 Container Oral Q supper  . insulin aspart  0-15 Units Subcutaneous TID WC  . insulin aspart  0-5 Units Subcutaneous QHS  . insulin glargine  30 Units Subcutaneous QHS   Continuous Infusions:   Principal Problem:   DKA (diabetic ketoacidoses) Active Problems:   Hereditary pancreatitis   Hypokalemia   DM type 1 (diabetes  mellitus, type 1)    Time spent: 35 minutes.     [name],[name]  [facility] Pager [phone]. If 7PM-7AM, please contact night-coverage at [email or web address], password [facility] 04/02/2013, 7:42 AM  LOS: 3 days

## 2013-05-16 ENCOUNTER — Ambulatory Visit (INDEPENDENT_AMBULATORY_CARE_PROVIDER_SITE_OTHER): Payer: BC Managed Care – PPO | Admitting: Family Medicine

## 2013-05-16 ENCOUNTER — Encounter: Payer: Self-pay | Admitting: Family Medicine

## 2013-05-16 VITALS — BP 122/80 | Ht 62.0 in | Wt 116.0 lb

## 2013-05-16 DIAGNOSIS — K8020 Calculus of gallbladder without cholecystitis without obstruction: Secondary | ICD-10-CM | POA: Insufficient documentation

## 2013-05-16 DIAGNOSIS — K859 Acute pancreatitis without necrosis or infection, unspecified: Secondary | ICD-10-CM

## 2013-05-16 DIAGNOSIS — E109 Type 1 diabetes mellitus without complications: Secondary | ICD-10-CM

## 2013-05-16 MED ORDER — OXYCODONE-ACETAMINOPHEN 7.5-325 MG PO TABS: 1.0000 | ORAL_TABLET | Freq: Four times a day (QID) | ORAL | Status: DC | PRN

## 2013-05-16 NOTE — Progress Notes (Signed)
  Subjective:    Patient ID: Linda Walls, female    DOB: 1990-12-12, 22 y.o.   MRN: [id number]  HPI Patient arrives for a follow up from hospitalization.  Has followed up with Endocrinologist. Discuss gallstones-surgeon didn't want to remove gallbladder-would like your opinion.  Patient arrives office for protracted discussion. She's been hospitalized twice for exacerbation of diabetes. The first occurrence appeared to be triggered by a urinary tract infection. The second occurrence likely was triggered by a viral gastroenteritis. Of note during the second hospitalization the patient had extensive testing. One of the results the CT scan revealed gallstones in the gallbladder.  And ordered ultrasound was canceled. CT scan revealed no evidence of inflammation in the wall of the gallbladder.  Further history of the admission does not reveal colic which sounds at all like gallbladder colic. Next  Patient continues to use Creon supplement with her history of likely pancreatic insufficiency.   Review of Systems No chest pain no back pain no postprandial colicky pain no change in bowel habits no blood in stool ROS otherwise negative    Objective:   Physical Exam  Alert HEENT normal. Lungs clear. Heart regular rate and rhythm. Abdomen completely benign.      Assessment & Plan:  Impression cholelithiasis both history and physicals and hospital charts were exam and in front of the patient and mother. I do not feel the patient's presentation revealed inflammation the gallbladder. Plan hold off on surgical referral to sign. Rationale discussed. This was also the opinion of the endocrinologist and in the surgeon who saw the patient in the hospital the last time. Easily 25 minutes spent most in discussion. [name]

## 2013-06-27 ENCOUNTER — Telehealth: Payer: Self-pay | Admitting: Family Medicine

## 2013-06-27 ENCOUNTER — Other Ambulatory Visit: Payer: Self-pay

## 2013-06-27 MED ORDER — OXYCODONE-ACETAMINOPHEN 7.5-325 MG PO TABS: 1.0000 | ORAL_TABLET | Freq: Four times a day (QID) | ORAL | Status: DC | PRN

## 2013-06-27 NOTE — Telephone Encounter (Signed)
Rx printed and left up front for patient pick up. Patient notified. 

## 2013-06-27 NOTE — Telephone Encounter (Signed)
Refill on oxycodone W/APA 7.5/325mg 

## 2013-06-27 NOTE — Telephone Encounter (Signed)
Had check up 05/13/13

## 2013-06-27 NOTE — Telephone Encounter (Signed)
Ok times one 

## 2013-08-15 ENCOUNTER — Ambulatory Visit (INDEPENDENT_AMBULATORY_CARE_PROVIDER_SITE_OTHER): Payer: BC Managed Care – PPO | Admitting: Family Medicine

## 2013-08-15 ENCOUNTER — Encounter: Payer: Self-pay | Admitting: Family Medicine

## 2013-08-15 VITALS — BP 112/84 | Ht 62.5 in | Wt 123.0 lb

## 2013-08-15 DIAGNOSIS — R109 Unspecified abdominal pain: Secondary | ICD-10-CM

## 2013-08-15 DIAGNOSIS — K219 Gastro-esophageal reflux disease without esophagitis: Secondary | ICD-10-CM

## 2013-08-15 DIAGNOSIS — K859 Acute pancreatitis without necrosis or infection, unspecified: Secondary | ICD-10-CM

## 2013-08-15 DIAGNOSIS — G8929 Other chronic pain: Secondary | ICD-10-CM

## 2013-08-15 DIAGNOSIS — R1013 Epigastric pain: Secondary | ICD-10-CM

## 2013-08-15 DIAGNOSIS — E109 Type 1 diabetes mellitus without complications: Secondary | ICD-10-CM

## 2013-08-15 MED ORDER — PANCRELIPASE (LIP-PROT-AMYL) 6000-19000 UNITS PO CPEP: 2.0000 | ORAL_CAPSULE | Freq: Three times a day (TID) | ORAL | Status: DC

## 2013-08-15 MED ORDER — OXYCODONE-ACETAMINOPHEN 7.5-325 MG PO TABS: ORAL_TABLET | ORAL | Status: DC

## 2013-08-15 MED ORDER — OMEPRAZOLE 20 MG PO CPDR: 20.0000 mg | DELAYED_RELEASE_CAPSULE | Freq: Every day | ORAL | Status: DC

## 2013-08-15 NOTE — Progress Notes (Signed)
   Subjective:    Patient ID: Linda Walls, female    DOB: [birth year], 23 y.o.   MRN: [id number]  HPIMed check up. No concerns.   dtr Linda Walls q three months  Glu good  [address]  Ex cardio mostly  Dances regularly Patient has history of chronic pain with her chronic pancreatitis. She uses approximately oxycodone 7.5-325 on a when necessary basis. She reports using approximately 30 per 45 days. She already sees the diabetes specialist every 3 months.  Reports overall reflux is stable.  Compliant with her pancreas enzymes. This is definitely helping her.  Her chronic pain in his mid abdomen area. Often accompanied by nausea  Review of Systems No headache no chest pain no back pain ROS otherwise    Objective:   Physical Exam  Alert no apparent distress. Lungs clear. Heart regular in rhythm. H&T normal. Abdomen no obvious tenderness no rebound or guarding.      Assessment & Plan:  Impression chronic pancreatitis with element of chronic pain. Patient claims handling medicine well. 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 They help oversee her medical care. Mother is always present and is present today. #2 reflux clinically stable. #3 pancreas insufficiency claims compliance with meds. States this is definitely helping. #4 insulin-dependent diabetes likely caused by #1 plan diet discussed. Exercise discussed. Chronic meds refilled. Oxycodone 7.5 #60 one up to twice a day 2 prescriptions written try  to make this last. Maintain other meds. [name]

## 2013-12-13 ENCOUNTER — Other Ambulatory Visit: Payer: Self-pay | Admitting: Family Medicine

## 2014-01-17 ENCOUNTER — Other Ambulatory Visit: Payer: Self-pay | Admitting: Family Medicine

## 2014-02-08 ENCOUNTER — Encounter: Payer: Self-pay | Admitting: Family Medicine

## 2014-02-08 ENCOUNTER — Ambulatory Visit (INDEPENDENT_AMBULATORY_CARE_PROVIDER_SITE_OTHER): Payer: BC Managed Care – PPO | Admitting: Family Medicine

## 2014-02-08 VITALS — BP 118/80 | Ht 62.5 in | Wt 122.0 lb

## 2014-02-08 DIAGNOSIS — E109 Type 1 diabetes mellitus without complications: Secondary | ICD-10-CM

## 2014-02-08 DIAGNOSIS — R1011 Right upper quadrant pain: Secondary | ICD-10-CM

## 2014-02-08 DIAGNOSIS — K219 Gastro-esophageal reflux disease without esophagitis: Secondary | ICD-10-CM

## 2014-02-08 DIAGNOSIS — R109 Unspecified abdominal pain: Secondary | ICD-10-CM

## 2014-02-08 MED ORDER — OXYCODONE-ACETAMINOPHEN 7.5-325 MG PO TABS: ORAL_TABLET | ORAL | Status: DC

## 2014-02-08 NOTE — Progress Notes (Signed)
   Subjective:    Patient ID: Linda Walls, female    DOB: 07-26-1990, 23 y.o.   MRN: [id number]  HPI Patient is here today for a standard 6 month check up.   Pt has no concerns.  Everything is going well. Except for abdominal pain. Patient has history of known gallstones and/or gallbladder sludge. This was first discovered on admission to the hospital for diabetic ketoacidosis. Patient still has bouts of severe pain particularly after fatty meals which makes her and her mother wonder whether her gallbladder is involved. Also of note father had to have cholecystectomy.  Reflux and heartburn not so bac, takes the med just prn twwice per wk  Glu's better, now on new insulin  Numb ok,  Diet and exercise, couple times wk, tries to walk  More swimming, Pain ooff and on, some a little higher  Uses oxycodone , few times per wk, usually after you eat  Dr Donne Hazel if referral  needed  Review of Systems No headache no chest pain no change in bowel habits no blood in stool no rash ROS otherwise negative    Objective:   Physical Exam Alert no acute distress. Vitals stable. HEENT normal. Lungs clear. Heart rare rhythm. Abdomen some epigastric tenderness no masses no rebound no guarding.  Old hospital records reviewed including CT scan which revealed gallbladder sludge/stones       Assessment & Plan:  Impression #1 potentially clinically significant cholelithiasis discussed #2 chronic pancreatitis with a minute intermittent abdominal pain makes #1 diagnostically challenging. #3 reflux stable. #4 type 1 diabetes followed by specialist stable discussed plan ultrasound. Oxycodone refilled. Use sparingly. Diet exercise discussed. Followup with all specialists. Recheck in 6 months. Further recommendations based on ultrasound results. [name]

## 2014-02-10 ENCOUNTER — Other Ambulatory Visit: Payer: Self-pay | Admitting: Family Medicine

## 2014-02-15 ENCOUNTER — Ambulatory Visit (HOSPITAL_COMMUNITY)
Admission: RE | Admit: 2014-02-15 | Discharge: 2014-02-15 | Disposition: A | Discharge status: Home / Self Care | Payer: BC Managed Care – PPO | Source: Ambulatory Visit | Attending: Family Medicine | Admitting: Family Medicine

## 2014-02-15 DIAGNOSIS — R1011 Right upper quadrant pain: Secondary | ICD-10-CM | POA: Diagnosis present

## 2014-02-16 NOTE — Progress Notes (Signed)
Ok let's do nm study if pos surg if neg surg is a bad idea

## 2014-03-14 ENCOUNTER — Other Ambulatory Visit: Payer: Self-pay | Admitting: *Deleted

## 2014-03-14 DIAGNOSIS — G8929 Other chronic pain: Secondary | ICD-10-CM

## 2014-03-14 DIAGNOSIS — R1011 Right upper quadrant pain: Principal | ICD-10-CM

## 2014-03-17 ENCOUNTER — Encounter (HOSPITAL_COMMUNITY): Payer: BC Managed Care – PPO

## 2014-03-21 ENCOUNTER — Encounter: Payer: Self-pay | Admitting: *Deleted

## 2014-03-21 LAB — HEMOGLOBIN A1C: A1c: 9.9

## 2014-03-23 ENCOUNTER — Encounter (HOSPITAL_COMMUNITY)
Admission: RE | Admit: 2014-03-23 | Discharge: 2014-03-23 | Disposition: A | Discharge status: Home / Self Care | Payer: BC Managed Care – PPO | Source: Ambulatory Visit | Attending: Diagnostic Radiology | Admitting: Diagnostic Radiology

## 2014-03-23 ENCOUNTER — Encounter (HOSPITAL_COMMUNITY): Payer: Self-pay

## 2014-03-23 DIAGNOSIS — R1011 Right upper quadrant pain: Secondary | ICD-10-CM | POA: Diagnosis not present

## 2014-03-23 DIAGNOSIS — G8929 Other chronic pain: Secondary | ICD-10-CM | POA: Diagnosis not present

## 2014-03-23 MED ORDER — SINCALIDE 5 MCG IJ SOLR
INTRAMUSCULAR | Status: AC
  Administered 2014-03-23: 1.14 ug via INTRAVENOUS
  Filled 2014-03-23: qty 5

## 2014-03-23 MED ORDER — SODIUM CHLORIDE 0.9 % IJ SOLN
INTRAMUSCULAR | Status: AC
  Filled 2014-03-23: qty 36

## 2014-03-23 MED ORDER — TECHNETIUM TC 99M MEBROFENIN IV KIT
5.0000 | PACK | Freq: Once | INTRAVENOUS | Status: AC | PRN
  Administered 2014-03-23: 5 via INTRAVENOUS

## 2014-03-23 MED ORDER — STERILE WATER FOR INJECTION IJ SOLN
INTRAMUSCULAR | Status: AC
  Administered 2014-03-23: 5 mL via INTRAVENOUS
  Filled 2014-03-23: qty 10

## 2014-05-11 ENCOUNTER — Encounter: Payer: BC Managed Care – PPO | Attending: Family Medicine | Admitting: Nutrition

## 2014-05-11 VITALS — Ht 62.0 in | Wt 120.0 lb

## 2014-05-11 DIAGNOSIS — Z713 Dietary counseling and surveillance: Secondary | ICD-10-CM | POA: Diagnosis not present

## 2014-05-11 DIAGNOSIS — K859 Acute pancreatitis without necrosis or infection, unspecified: Secondary | ICD-10-CM

## 2014-05-11 DIAGNOSIS — IMO0002 Reserved for concepts with insufficient information to code with codable children: Secondary | ICD-10-CM

## 2014-05-11 DIAGNOSIS — Z794 Long term (current) use of insulin: Secondary | ICD-10-CM | POA: Diagnosis not present

## 2014-05-11 DIAGNOSIS — E108 Type 1 diabetes mellitus with unspecified complications: Secondary | ICD-10-CM

## 2014-05-11 DIAGNOSIS — E1065 Type 1 diabetes mellitus with hyperglycemia: Secondary | ICD-10-CM | POA: Diagnosis not present

## 2014-05-11 NOTE — Progress Notes (Signed)
  Medical Nutrition Therapy:  Appt start time: 6834 end time:  1630.   Assessment:  Primary concerns today: Diabetes Type 1 DM. Living at home with parents and working part time at [employer]. Toujeo 30 units daily. Novolog with meals 8 units plus sliding scale. FBS;70-150, 150's before lunch and dinner and bedtime 200 mg/dl. Cardio exercise a few times per week. A1C was 8.8%.   Was 10% 6 months ago. Desires to weigh 125 lbs. Lost about 5-6 since last visit with Dr.Nida.. She has a history of  Hereditary  Pancreatitis when she was 18 that caused her to have Type 1 DM. Is on pancreatic enzymes with meals. Denies any diarrhea or loose fatty stools to indicate malabsorption. Has had few low blood sugars in middle of the night due to not eating enough carbs at dinner. She tends to not be as hungry at night as she is during the day.  Preferred Learning Style:    Auditory   No preference indicated   Learning Readiness:     Ready  Change in progress   MEDICATIONS: see list   DIETARY INTAKE:  24-hr recall:  B ( AM): 1 orange, and french toast 2 slice, Typically eats 60 g of carbs for breakfast.  Snk ( AM): none L ( PM): Mac and cheese 50-60 g of carbs and water. Snk ( PM): fruit D ( PM): Chicken, broccoli, water Snk ( PM): noe Beverages: water, diet occassionally  Usual physical activity: walking / cardio   Estimated energy needs: 2000 calories 225 g carbohydrates 150 g protein 56 g fat  Progress Towards Goal(s):  In Progress   Nutritional Diagnosis:  NB-1.1 Food and nutrition-related knowledge deficit As related to Type 1 Diabetes.  As evidenced by A1C 8.8%.    Intervention:  Nutrition counseling and diabetes education.  Plan:  Aim for 4-5 Carb Choices per meal (60-75 grams) +/- 1 either way  Consider eating protein and healthy fats as a snack between meals 1-2 times a day for desired weight gain. Include protein in moderation with your meals and snacks Consider  reading food labels for Total Carbohydrate and Fat Grams of foods Consider checking BG at alternate times per day as directed by MD but especially 2 hours after largest meal per day to evaluate BS control Continue insulin regimen as prescribed by Dr. Dorris Fetch. Goal:  Get A1C to 7.5% in three meals. 2. Gain 5 lbs in the next three 3 - 6 months. 3. Test bs 2 hrs after largest meal per day. 4. Eat 60 carbs at meals.   Teaching Method Utilized:  Visual Auditory Hands on  Handouts given during visit include: My Plate Carb Counting and Food Label handouts Meal Plan Card   Barriers to learning/adherence to lifestyle change: none  Demonstrated degree of understanding via:  Teach Back   Monitoring/Evaluation:  Dietary intake, exercise, meal planning, SBG, and body weight  In 1 month.Marland Kitchen

## 2014-05-11 NOTE — Patient Instructions (Addendum)
  Intervention:  Nutrition counseling and diabetes education. .Plan:  Aim for 4-5 Carb Choices per meal (60-75 grams) +/- 1 either way  Consider eating protein and healthy fats as a snack between meals 1-2 times a day for desired weight gain. Include protein in moderation with your meals and snacks Consider reading food labels for Total Carbohydrate and Fat Grams of foods Consider checking BG at alternate times per day as directed by MD  Continue insulin regimen as prescribed. Goal:  Get A1C to 7.5% in three meals. 2. Gain 5 lbs in the next three 3 - 6 months. 3. Test bs 2 hrs after largest meal per day. 4. Eat 60 carbs at meals, especially at night to prevent hypoglycemia.

## 2014-06-12 ENCOUNTER — Encounter: Payer: BC Managed Care – PPO | Attending: Family Medicine | Admitting: Nutrition

## 2014-06-12 DIAGNOSIS — E1065 Type 1 diabetes mellitus with hyperglycemia: Secondary | ICD-10-CM | POA: Insufficient documentation

## 2014-06-12 DIAGNOSIS — Z713 Dietary counseling and surveillance: Secondary | ICD-10-CM | POA: Insufficient documentation

## 2014-06-12 DIAGNOSIS — Z794 Long term (current) use of insulin: Secondary | ICD-10-CM | POA: Insufficient documentation

## 2014-07-19 ENCOUNTER — Other Ambulatory Visit: Payer: Self-pay | Admitting: Family Medicine

## 2014-08-04 ENCOUNTER — Encounter: Payer: Self-pay | Admitting: Family Medicine

## 2014-08-04 ENCOUNTER — Ambulatory Visit (INDEPENDENT_AMBULATORY_CARE_PROVIDER_SITE_OTHER): Payer: BC Managed Care – PPO | Admitting: Family Medicine

## 2014-08-04 VITALS — BP 112/78 | Ht 62.5 in | Wt 124.4 lb

## 2014-08-04 DIAGNOSIS — K859 Acute pancreatitis without necrosis or infection, unspecified: Secondary | ICD-10-CM

## 2014-08-04 DIAGNOSIS — G8929 Other chronic pain: Secondary | ICD-10-CM

## 2014-08-04 DIAGNOSIS — R1013 Epigastric pain: Secondary | ICD-10-CM

## 2014-08-04 DIAGNOSIS — K219 Gastro-esophageal reflux disease without esophagitis: Secondary | ICD-10-CM

## 2014-08-04 MED ORDER — OXYCODONE-ACETAMINOPHEN 7.5-325 MG PO TABS: ORAL_TABLET | ORAL | Status: DC

## 2014-08-04 MED ORDER — HYOSCYAMINE SULFATE 0.125 MG SL SUBL: SUBLINGUAL_TABLET | SUBLINGUAL | Status: DC

## 2014-08-04 MED ORDER — OMEPRAZOLE 20 MG PO CPDR: 20.0000 mg | DELAYED_RELEASE_CAPSULE | Freq: Every day | ORAL | Status: DC

## 2014-08-04 MED ORDER — PANCRELIPASE (LIP-PROT-AMYL) 6000-19000 UNITS PO CPEP: 2.0000 | ORAL_CAPSULE | Freq: Three times a day (TID) | ORAL | Status: DC

## 2014-08-04 NOTE — Progress Notes (Signed)
   Subjective:    Patient ID: Linda Walls, female    DOB: 07/30/1990, 24 y.o.   MRN: [id number]  HPI Patient arrives for a follow up on chronic abd pain. Patient currently sees Dr Dorris Fetch for diabetes and doing well with no problems or concerns.  Followed by diabetes specialist. A1c up and moved up on the insulin  Pt  Working [employer]  Working full time,  Exercising three times per wk for half an hour or so,  Impact ex and diet mosly  Chronic abd pain comes and goes, a bit increased this past  Wk. when it flares up patient needs narcotics to calm down discomfort. Patient's mother works with her on distribution of the pain medication. She understands not to use it for anything other than significant pain  Reflux stable not bad, watching diet compliant with reflux medications. No obvious side effect.   Patient takes chronic pancreatitis enzymes. Cost has gone up further. 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 Family to explore with pharmacist other possible pancreas enzyme alternatives that are less expensive. Oxycodone refilled. Side effects discussed carefully. Check every 6 months. [name]

## 2014-08-21 ENCOUNTER — Other Ambulatory Visit: Payer: Self-pay | Admitting: Family Medicine

## 2014-09-14 ENCOUNTER — Inpatient Hospital Stay (HOSPITAL_COMMUNITY): Payer: BC Managed Care – PPO

## 2014-09-14 ENCOUNTER — Encounter (HOSPITAL_COMMUNITY): Payer: Self-pay | Admitting: *Deleted

## 2014-09-14 ENCOUNTER — Inpatient Hospital Stay (HOSPITAL_COMMUNITY)
Admission: EM | Admit: 2014-09-14 | Discharge: 2014-09-15 | DRG: 639 | Disposition: A | Discharge status: Home / Self Care | Payer: BC Managed Care – PPO | Attending: Internal Medicine | Admitting: Internal Medicine

## 2014-09-14 DIAGNOSIS — Z91013 Allergy to seafood: Secondary | ICD-10-CM

## 2014-09-14 DIAGNOSIS — E0810 Diabetes mellitus due to underlying condition with ketoacidosis without coma: Secondary | ICD-10-CM

## 2014-09-14 DIAGNOSIS — K859 Acute pancreatitis without necrosis or infection, unspecified: Secondary | ICD-10-CM | POA: Diagnosis present

## 2014-09-14 DIAGNOSIS — Z803 Family history of malignant neoplasm of breast: Secondary | ICD-10-CM | POA: Diagnosis not present

## 2014-09-14 DIAGNOSIS — Z8249 Family history of ischemic heart disease and other diseases of the circulatory system: Secondary | ICD-10-CM

## 2014-09-14 DIAGNOSIS — Z794 Long term (current) use of insulin: Secondary | ICD-10-CM

## 2014-09-14 DIAGNOSIS — K219 Gastro-esophageal reflux disease without esophagitis: Secondary | ICD-10-CM | POA: Diagnosis present

## 2014-09-14 DIAGNOSIS — G8929 Other chronic pain: Secondary | ICD-10-CM | POA: Diagnosis present

## 2014-09-14 DIAGNOSIS — R1084 Generalized abdominal pain: Secondary | ICD-10-CM

## 2014-09-14 DIAGNOSIS — E1010 Type 1 diabetes mellitus with ketoacidosis without coma: Principal | ICD-10-CM | POA: Diagnosis present

## 2014-09-14 DIAGNOSIS — Z791 Long term (current) use of non-steroidal anti-inflammatories (NSAID): Secondary | ICD-10-CM

## 2014-09-14 DIAGNOSIS — E860 Dehydration: Secondary | ICD-10-CM | POA: Diagnosis present

## 2014-09-14 DIAGNOSIS — E109 Type 1 diabetes mellitus without complications: Secondary | ICD-10-CM | POA: Diagnosis present

## 2014-09-14 DIAGNOSIS — E1110 Type 2 diabetes mellitus with ketoacidosis without coma: Secondary | ICD-10-CM | POA: Diagnosis present

## 2014-09-14 LAB — COMPREHENSIVE METABOLIC PANEL
ALBUMIN: 5.3 g/dL — AB (ref 3.5–5.2)
ALT: 18 U/L (ref 0–35)
AST: 23 U/L (ref 0–37)
Alkaline Phosphatase: 100 U/L (ref 39–117)
BUN: 20 mg/dL (ref 6–23)
CALCIUM: 10.3 mg/dL (ref 8.4–10.5)
CHLORIDE: 99 mmol/L (ref 96–112)
CO2: 11 mmol/L — ABNORMAL LOW (ref 19–32)
Creatinine, Ser: 1.04 mg/dL (ref 0.50–1.10)
GFR, EST AFRICAN AMERICAN: 88 mL/min — AB (ref >90)
GFR, EST NON AFRICAN AMERICAN: 76 mL/min — AB (ref >90)
Glucose, Bld: 415 mg/dL — ABNORMAL HIGH (ref 70–99)
Potassium: 5 mmol/L (ref 3.5–5.1)
SODIUM: 134 mmol/L — AB (ref 135–145)
Total Bilirubin: 1.6 mg/dL — ABNORMAL HIGH (ref 0.3–1.2)
Total Protein: 9.4 g/dL — ABNORMAL HIGH (ref 6.0–8.3)

## 2014-09-14 LAB — CBC WITH DIFFERENTIAL/PLATELET
BASOS ABS: 0 10*3/uL (ref 0.0–0.1)
BASOS PCT: 0 % (ref 0–1)
EOS ABS: 0 10*3/uL (ref 0.0–0.7)
Eosinophils Relative: 0 % (ref 0–5)
HEMATOCRIT: 43.7 % (ref 36.0–46.0)
HEMOGLOBIN: 14.9 g/dL (ref 12.0–15.0)
Lymphocytes Relative: 10 % — ABNORMAL LOW (ref 12–46)
Lymphs Abs: 1.5 10*3/uL (ref 0.7–4.0)
MCH: 31.1 pg (ref 26.0–34.0)
MCHC: 34.1 g/dL (ref 30.0–36.0)
MCV: 91.2 fL (ref 78.0–100.0)
Monocytes Absolute: 0.7 10*3/uL (ref 0.1–1.0)
Monocytes Relative: 5 % (ref 3–12)
NEUTROS PCT: 86 % — AB (ref 43–77)
Neutro Abs: 12.7 10*3/uL — ABNORMAL HIGH (ref 1.7–7.7)
Platelets: 456 10*3/uL — ABNORMAL HIGH (ref 150–400)
RBC: 4.79 MIL/uL (ref 3.87–5.11)
RDW: 12 % (ref 11.5–15.5)
WBC: 14.9 10*3/uL — ABNORMAL HIGH (ref 4.0–10.5)

## 2014-09-14 LAB — GLUCOSE, CAPILLARY
GLUCOSE-CAPILLARY: 151 mg/dL — AB (ref 70–99)
Glucose-Capillary: 326 mg/dL — ABNORMAL HIGH (ref 70–99)
Glucose-Capillary: 221 mg/dL — ABNORMAL HIGH (ref 70–99)
Glucose-Capillary: 194 mg/dL — ABNORMAL HIGH (ref 70–99)

## 2014-09-14 LAB — URINALYSIS, ROUTINE W REFLEX MICROSCOPIC
BILIRUBIN URINE: NEGATIVE
Glucose, UA: 500 mg/dL — AB
Hgb urine dipstick: NEGATIVE
LEUKOCYTES UA: NEGATIVE
Nitrite: NEGATIVE
PH: 5.5 (ref 5.0–8.0)
PROTEIN: NEGATIVE mg/dL
Specific Gravity, Urine: >1.03 — ABNORMAL HIGH (ref 1.005–1.030)
UROBILINOGEN UA: 0.2 mg/dL (ref 0.0–1.0)

## 2014-09-14 LAB — BASIC METABOLIC PANEL
ANION GAP: 18 — AB (ref 5–15)
ANION GAP: 11 (ref 5–15)
ANION GAP: 8 (ref 5–15)
BUN: 19 mg/dL (ref 6–23)
BUN: 15 mg/dL (ref 6–23)
BUN: 11 mg/dL (ref 6–23)
CALCIUM: 9.1 mg/dL (ref 8.4–10.5)
CALCIUM: 9.1 mg/dL (ref 8.4–10.5)
CALCIUM: 9.4 mg/dL (ref 8.4–10.5)
CHLORIDE: 112 mmol/L (ref 96–112)
CO2: 9 mmol/L — CL (ref 19–32)
CO2: 14 mmol/L — ABNORMAL LOW (ref 19–32)
CO2: 18 mmol/L — AB (ref 19–32)
Chloride: 109 mmol/L (ref 96–112)
Chloride: 109 mmol/L (ref 96–112)
Creatinine, Ser: 0.94 mg/dL (ref 0.50–1.10)
Creatinine, Ser: 0.71 mg/dL (ref 0.50–1.10)
Creatinine, Ser: 0.53 mg/dL (ref 0.50–1.10)
GFR calc Af Amer: >90 mL/min (ref >90)
GFR calc non Af Amer: 86 mL/min — ABNORMAL LOW (ref >90)
GFR calc non Af Amer: >90 mL/min (ref >90)
Glucose, Bld: 343 mg/dL — ABNORMAL HIGH (ref 70–99)
Glucose, Bld: 172 mg/dL — ABNORMAL HIGH (ref 70–99)
Glucose, Bld: 135 mg/dL — ABNORMAL HIGH (ref 70–99)
POTASSIUM: 4.3 mmol/L (ref 3.5–5.1)
Potassium: 5 mmol/L (ref 3.5–5.1)
Potassium: 3.8 mmol/L (ref 3.5–5.1)
SODIUM: 135 mmol/L (ref 135–145)
Sodium: 136 mmol/L (ref 135–145)
Sodium: 137 mmol/L (ref 135–145)

## 2014-09-14 LAB — PREGNANCY, URINE: PREG TEST UR: NEGATIVE

## 2014-09-14 LAB — MRSA PCR SCREENING: MRSA by PCR: NEGATIVE

## 2014-09-14 LAB — CBG MONITORING, ED
Glucose-Capillary: 337 mg/dL — ABNORMAL HIGH (ref 70–99)
Glucose-Capillary: 352 mg/dL — ABNORMAL HIGH (ref 70–99)

## 2014-09-14 LAB — LIPASE, BLOOD: Lipase: 13 U/L (ref 11–59)

## 2014-09-14 MED ORDER — SODIUM CHLORIDE 0.9 % IV SOLN: INTRAVENOUS | Status: DC

## 2014-09-14 MED ORDER — ONDANSETRON HCL 4 MG PO TABS: 4.0000 mg | ORAL_TABLET | Freq: Four times a day (QID) | ORAL | Status: DC | PRN

## 2014-09-14 MED ORDER — SODIUM CHLORIDE 0.9 % IV SOLN: INTRAVENOUS | Status: AC

## 2014-09-14 MED ORDER — DEXTROSE-NACL 5-0.45 % IV SOLN: INTRAVENOUS | Status: DC

## 2014-09-14 MED ORDER — SODIUM CHLORIDE 0.9 % IJ SOLN
3.0000 mL | Freq: Two times a day (BID) | INTRAMUSCULAR | Status: DC
  Administered 2014-09-14 – 2014-09-15 (×3): 3 mL via INTRAVENOUS

## 2014-09-14 MED ORDER — ONDANSETRON HCL 4 MG/2ML IJ SOLN
4.0000 mg | Freq: Once | INTRAMUSCULAR | Status: AC
  Administered 2014-09-14: 4 mg via INTRAVENOUS
  Filled 2014-09-14: qty 2

## 2014-09-14 MED ORDER — SODIUM CHLORIDE 0.9 % IV SOLN
INTRAVENOUS | Status: DC
  Administered 2014-09-14: 11:00:00 via INTRAVENOUS

## 2014-09-14 MED ORDER — PANTOPRAZOLE SODIUM 40 MG PO TBEC
40.0000 mg | DELAYED_RELEASE_TABLET | Freq: Every day | ORAL | Status: DC
  Administered 2014-09-14 – 2014-09-15 (×2): 40 mg via ORAL
  Filled 2014-09-14 (×2): qty 1

## 2014-09-14 MED ORDER — INSULIN GLARGINE 100 UNIT/ML ~~LOC~~ SOLN
SUBCUTANEOUS | Status: AC
  Filled 2014-09-14: qty 10

## 2014-09-14 MED ORDER — PROMETHAZINE HCL 12.5 MG PO TABS
25.0000 mg | ORAL_TABLET | ORAL | Status: DC | PRN
  Administered 2014-09-14: 25 mg via ORAL
  Filled 2014-09-14: qty 2

## 2014-09-14 MED ORDER — SODIUM CHLORIDE 0.9 % IV SOLN: 1000.0000 mL | INTRAVENOUS | Status: DC

## 2014-09-14 MED ORDER — ACETAMINOPHEN 325 MG PO TABS: 650.0000 mg | ORAL_TABLET | Freq: Four times a day (QID) | ORAL | Status: DC | PRN

## 2014-09-14 MED ORDER — MORPHINE SULFATE 2 MG/ML IJ SOLN
2.0000 mg | INTRAMUSCULAR | Status: DC | PRN
  Administered 2014-09-14 – 2014-09-15 (×4): 2 mg via INTRAVENOUS
  Filled 2014-09-14 (×4): qty 1

## 2014-09-14 MED ORDER — INSULIN REGULAR HUMAN 100 UNIT/ML IJ SOLN
INTRAMUSCULAR | Status: DC
  Administered 2014-09-14: 2.9 [IU]/h via INTRAVENOUS
  Filled 2014-09-14: qty 2.5

## 2014-09-14 MED ORDER — SODIUM CHLORIDE 0.9 % IV SOLN
1000.0000 mL | Freq: Once | INTRAVENOUS | Status: AC
  Administered 2014-09-14: 1000 mL via INTRAVENOUS

## 2014-09-14 MED ORDER — POTASSIUM CHLORIDE 10 MEQ/100ML IV SOLN
10.0000 meq | INTRAVENOUS | Status: AC
  Administered 2014-09-14 (×2): 10 meq via INTRAVENOUS
  Filled 2014-09-14: qty 100

## 2014-09-14 MED ORDER — INSULIN ASPART 100 UNIT/ML ~~LOC~~ SOLN: 0.0000 [IU] | Freq: Every day | SUBCUTANEOUS | Status: DC

## 2014-09-14 MED ORDER — PANCRELIPASE (LIP-PROT-AMYL) 12000-38000 UNITS PO CPEP
1.0000 | ORAL_CAPSULE | Freq: Three times a day (TID) | ORAL | Status: DC
  Administered 2014-09-15: 12000 [IU] via ORAL
  Filled 2014-09-14 (×2): qty 1

## 2014-09-14 MED ORDER — INSULIN GLARGINE 100 UNIT/ML ~~LOC~~ SOLN
30.0000 [IU] | Freq: Every day | SUBCUTANEOUS | Status: DC
  Administered 2014-09-14: 30 [IU] via SUBCUTANEOUS
  Filled 2014-09-14 (×4): qty 0.3

## 2014-09-14 MED ORDER — HEPARIN SODIUM (PORCINE) 5000 UNIT/ML IJ SOLN
5000.0000 [IU] | Freq: Three times a day (TID) | INTRAMUSCULAR | Status: DC
  Administered 2014-09-14 – 2014-09-15 (×3): 5000 [IU] via SUBCUTANEOUS
  Filled 2014-09-14 (×3): qty 1

## 2014-09-14 MED ORDER — FENTANYL CITRATE 0.05 MG/ML IJ SOLN
50.0000 ug | Freq: Once | INTRAMUSCULAR | Status: AC
  Administered 2014-09-14: 50 ug via INTRAVENOUS

## 2014-09-14 MED ORDER — LINACLOTIDE 145 MCG PO CAPS
145.0000 ug | ORAL_CAPSULE | Freq: Every day | ORAL | Status: DC
Start: 2014-09-14 — End: 2014-09-15
  Administered 2014-09-14 – 2014-09-15 (×2): 145 ug via ORAL
  Filled 2014-09-14 (×6): qty 1

## 2014-09-14 MED ORDER — ACETAMINOPHEN 650 MG RE SUPP: 650.0000 mg | Freq: Four times a day (QID) | RECTAL | Status: DC | PRN

## 2014-09-14 MED ORDER — FENTANYL CITRATE 0.05 MG/ML IJ SOLN
50.0000 ug | Freq: Once | INTRAMUSCULAR | Status: DC
  Filled 2014-09-14: qty 2

## 2014-09-14 MED ORDER — SODIUM CHLORIDE 0.9 % IV BOLUS (SEPSIS)
1000.0000 mL | Freq: Once | INTRAVENOUS | Status: AC
  Administered 2014-09-14: 1000 mL via INTRAVENOUS

## 2014-09-14 MED ORDER — SODIUM CHLORIDE 0.9 % IV SOLN
INTRAVENOUS | Status: DC
  Administered 2014-09-14: 5.6 [IU]/h via INTRAVENOUS
  Administered 2014-09-14: 7.2 [IU]/h via INTRAVENOUS
  Administered 2014-09-14: 5.3 [IU]/h via INTRAVENOUS
  Filled 2014-09-14: qty 2.5

## 2014-09-14 MED ORDER — POLYETHYLENE GLYCOL 3350 17 G PO PACK
17.0000 g | PACK | Freq: Every day | ORAL | Status: DC
Start: 2014-09-14 — End: 2014-09-15
  Administered 2014-09-15: 17 g via ORAL
  Filled 2014-09-14 (×3): qty 1

## 2014-09-14 MED ORDER — INSULIN ASPART 100 UNIT/ML ~~LOC~~ SOLN: 0.0000 [IU] | Freq: Three times a day (TID) | SUBCUTANEOUS | Status: DC

## 2014-09-14 MED ORDER — ONDANSETRON HCL 4 MG/2ML IJ SOLN
4.0000 mg | Freq: Four times a day (QID) | INTRAMUSCULAR | Status: DC | PRN
  Administered 2014-09-14 (×2): 4 mg via INTRAVENOUS
  Filled 2014-09-14 (×2): qty 2

## 2014-09-14 MED ORDER — DEXTROSE-NACL 5-0.45 % IV SOLN
INTRAVENOUS | Status: DC
  Administered 2014-09-14: 1000 mL via INTRAVENOUS
  Administered 2014-09-14 – 2014-09-15 (×2): via INTRAVENOUS

## 2014-09-14 MED ORDER — INSULIN ASPART 100 UNIT/ML ~~LOC~~ SOLN
0.0000 [IU] | SUBCUTANEOUS | Status: DC
  Administered 2014-09-14: 1 [IU] via SUBCUTANEOUS
  Administered 2014-09-14: 2 [IU] via SUBCUTANEOUS

## 2014-09-14 NOTE — ED Provider Notes (Signed)
Patient screen by me. Patient insulin-dependent diabetic has had problems with diabetic ketoacidosis in the past. Patient with vomiting and some diarrhea starting yesterday. Now with abdominal pain. Blood sugars this morning were running around 450. Basic orders placed and fluids ordered.  Fredia Sorrow, MD 09/14/14 [phone]

## 2014-09-14 NOTE — H&P (Signed)
[facility] History and Physical  Linda Walls AST:[id number] DOB: [birth year] DOA: 09/14/2014  Referring physician: Emergency Department PCP: [name], MD  Specialists:   Chief Complaint: Abd pain  HPI: Linda Walls is a 24 y.o. female  With a hx of type 1 DM, hereditary pancreatitis who presents to the ED with complaints of n/v with abd pain. Per report, pt has been taking suppositories without improvement. In the ED, glucose was noted to be in the 400's with positive ketones in urine and GAP of 24. The patient was started on IVF and insulin gtt and hospitalist consulted for consideration for admission.  Review of Systems:  Review of Systems  Constitutional: Positive for chills. Negative for fever.  HENT: Negative for ear discharge and hearing loss.   Eyes: Negative for pain and discharge.  Respiratory: Negative for cough and shortness of breath.   Cardiovascular: Negative for chest pain and palpitations.  Gastrointestinal: Positive for nausea, vomiting and abdominal pain. Negative for constipation.  Genitourinary: Negative for urgency and hematuria.  Musculoskeletal: Negative for myalgias and joint pain.  Skin: Negative for itching and rash.  Neurological: Negative for tremors, seizures and loss of consciousness.  Psychiatric/Behavioral: Negative for depression and hallucinations.     Past Medical History  Diagnosis Date  . Hereditary pancreatitis   . DM type 1 (diabetes mellitus, type 1)   . Hernia   . GERD (gastroesophageal reflux disease)   . Jaundice    Past Surgical History  Procedure Laterality Date  . Hernia repair      As infant.  . Bile duct stent placement  2006 at [facility]  . Pancreatic cyst drainage  at age 51 year   Social History:  reports that she has never smoked. She has never used smokeless tobacco. She reports that she does not drink alcohol or use illicit drugs.  where does patient live--home, ALF, SNF? and with whom if at home?  Can  patient participate in ADLs?  Allergies  Allergen Reactions  . Cefzil [Cefprozil]     Sickness  . Shellfish Allergy Nausea And Vomiting  . Sulfa Antibiotics Rash    Family History  Problem Relation Age of Onset  . Pancreatitis Father 30    hereditary  . Hypertension Father   . GER disease Father   . Breast cancer Maternal Aunt   . Congenital heart disease Maternal Grandfather   . Prostate cancer Maternal Grandfather     (be sure to complete)  Prior to Admission medications   Medication Sig Start Date End Date Taking? Authorizing Provider  ACCU-CHEK AVIVA PLUS test strip  01/20/14   Historical Provider, MD  hyoscyamine (LEVSIN SL) 0.125 MG SL tablet PLACE 1 TABLET UNDER THE TONGUE EVERY 4 HOURS AS NEEDED FOR PAIN. 08/04/14   [name], MD  ibuprofen (ADVIL,MOTRIN) 200 MG tablet Take 400 mg by mouth every 6 (six) hours as needed. pain    Historical Provider, MD  insulin aspart (NOVOLOG) 100 UNIT/ML injection Inject 6 Units into the skin 3 (three) times daily before meals. *Increase subcutaneous injection by 1 unit for every 50 units over 150.* 04/02/13   [name], MD  Insulin Glargine (TOUJEO SOLOSTAR) 300 UNIT/ML SOPN Inject 30 Units into the skin every evening.    Historical Provider, MD  IRON PO Take by mouth.    Historical Provider, MD  Multiple Vitamins-Minerals (MULTIVITAMINS THER. W/MINERALS) TABS Take 1 tablet by mouth daily. For energy    Historical Provider, MD  omeprazole (PRILOSEC) 20 MG capsule Take 1 capsule (20 mg total) by mouth daily. 08/04/14   [name], MD  oxyCODONE-acetaminophen (PERCOCET) 7.5-325 MG per tablet Take one tablet up to BID prn pain 08/04/14   [name], MD  Pancrelipase, Lip-Prot-Amyl, 6000 UNITS CPEP Take 2 capsules (12,000 Units total) by mouth 3 (three) times daily before meals. 08/04/14   [name], MD  promethazine (PHENERGAN) 25 MG suppository Place 25 mg rectally every 6 (six) hours as needed for nausea.     Historical Provider, MD  promethazine (PHENERGAN) 25 MG tablet TAKE 1 TABLET BY MOUTH EVERY 6 HOURS AS NEEDED FOR NAUSEA. 08/21/14   [name], MD  Vitamin D, Ergocalciferol, (DRISDOL) 50000 UNITS CAPS Take 50,000 Units by mouth every 7 (seven) days. Takes on Wednesdays. 09/15/12   Historical Provider, MD   Physical Exam: Filed Vitals:   09/14/14 0659 09/14/14 0801 09/14/14 0830  BP: 128/82 118/64 118/64  Pulse: 122 106 116  Temp: 98 F (36.7 C) 98.7 F (37.1 C)   TempSrc: Oral Oral   Resp: 18 14   Height: 5\' 2"  (1.575 m)    Weight: 54.432 kg (120 lb)    SpO2: 100% 100% 100%     General:  Awake, in nad  Eyes: PERRL B  ENT: membranes dry, dentition fair  Neck: trachea midline, neck supple  Cardiovascular: regular, s1, s2  Respiratory: normal resp effort, no wheezing  Abdomen: soft, nondistended  Skin: normal skin turgor, no abnormal skin lesions seen  Musculoskeletal: perfused,no clubbing  Psychiatric: mood/affect normal// no auditory/visual hallucinations  Neurologic:cn2-12 grossly intact, strength/sensation intact  Labs on Admission:  Basic Metabolic Panel:  Recent Labs Lab 09/14/14 0731  NA 134*  K 5.0  CL 99  CO2 11*  GLUCOSE 415*  BUN 20  CREATININE 1.04  CALCIUM 10.3   Liver Function Tests:  Recent Labs Lab 09/14/14 0731  AST 23  ALT 18  ALKPHOS 100  BILITOT 1.6*  PROT 9.4*  ALBUMIN 5.3*    Recent Labs Lab 09/14/14 0731  LIPASE 13   No results for input(s): AMMONIA in the last 168 hours. CBC:  Recent Labs Lab 09/14/14 0731  WBC 14.9*  NEUTROABS 12.7*  HGB 14.9  HCT 43.7  MCV 91.2  PLT 456*   Cardiac Enzymes: No results for input(s): CKTOTAL, CKMB, CKMBINDEX, TROPONINI in the last 168 hours.  BNP (last 3 results) No results for input(s): BNP in the last 8760 hours.  ProBNP (last 3 results) No results for input(s): PROBNP in the last 8760 hours.  CBG:  Recent Labs Lab 09/14/14 0732  GLUCAP 337*     Radiological Exams on Admission: No results found.   Assessment/Plan Principal Problem:   DKA (diabetic ketoacidoses) Active Problems:   Hereditary pancreatitis   DM type 1 (diabetes mellitus, type 1)   Dehydration   1. DKA 1. Elevated glucose with ketones in urine and GAP of 24 2. Agree with and will cont IVF with insulin gtt for now 3. Follow lytes closely 4. Plan to transition off insulin gtt once GAP closes 5. Admit to stepdown 2. Hx Hereditary Pancreatitis 1. stable 3. DM 1 1. Glucose management per above 4. Dehydration 1. Continue on aggressive IVF as tolerated 5. Abd pain 1. Suspect secondary to above DKA 2. Since pt is on chronic pain meds, will check abd xray to r/o constipation that can result in presenting abd pain as well 6. DVT prophylaxis 1. Heparin subQ  Code Status: Full (must indicate code status--if unknown or must be presumed, indicate so) Family Communication: Pt in room (indicate person spoken with, if applicable, with phone number if by telephone) Disposition Plan: Admit to stepdown   [facility], [facility] Pager [phone]  If 7PM-7AM, please contact night-coverage [email or web address] Password [email or web address] 09/14/2014, 9:09 AM

## 2014-09-14 NOTE — Progress Notes (Signed)
Inpatient Diabetes Program Recommendations  AACE/ADA: New Consensus Statement on Inpatient Glycemic Control (2013)  Target Ranges:  Prepandial:   less than 140 mg/dL      Peak postprandial:   less than 180 mg/dL (1-2 hours)      Critically ill patients:  140 - 180 mg/dL   Results for SHAINNA, FAUX (MRN [id number]) as of 09/14/2014 10:20  Ref. Range 09/14/2014 07:32 09/14/2014 09:19  Glucose-Capillary Latest Range: 70-99 mg/dL 337 (H) 352 (H)    Reason for Visit: DKA  Diabetes history: Dm 1 Outpatient Diabetes medications: Toujeo 30 units QPM, Novolog 6 units TID Current orders for Inpatient glycemic control: Insulin gtt  Inpatient Diabetes Program Recommendations Insulin - Basal: Looking at the patient's basal doses at home. Consider Lantus 30 units Q24hrs when it is time to transition off insulin gtt.  Thanks,  Tama Headings RN, MSN, [facility] Inpatient Diabetes Coordinator Team Pager [phone]

## 2014-09-14 NOTE — Care Management Utilization Note (Signed)
UR completed 

## 2014-09-14 NOTE — ED Notes (Addendum)
Pt states LUQ abdominal pain which began Saturday. Vomiting began yesterday morning. A couple of episodes of loose stools. Mother states low grade fever of 100 yesterday. Last vomited 30 min PTA. CBG this morning was 450

## 2014-09-14 NOTE — ED Provider Notes (Signed)
CSN: [id number]     Arrival date & time 09/14/14  1194 History  This chart was scribed for Linda Belling, MD by Einar Pheasant, Medical Scribe. This patient was seen in room APA12/APA12 and the patient's care was started at 7:24 AM.      Chief Complaint  Patient presents with  . Abdominal Pain    The history is provided by the patient and medical records. No language interpreter was used.   HPI Comments: Linda Walls is a 24 y.o. female who was diagnosed with hereditary pancreatitis at the age of 64 and has a PMhx of DM-type 1 presents to the Emergency Department complaining of sudden onset intermittent episodes of emesis for the past 24 hours. She also complains of associated generalized abdominal pain, persistent nausea, and hyperglycemia. Pt states that her blood sugar is generally controlled. She does endorse a few episodes of loose bowels. Pt denies any change of pregnancy.  She reports taking suppositories that has not relieved her symptoms. Pt denies any fever, neck pain, sore throat, visual disturbance, CP, cough, SOB, urinary symptoms, back pain, HA, weakness, numbness and rash as associated symptoms.    Past Medical History  Diagnosis Date  . Hereditary pancreatitis   . DM type 1 (diabetes mellitus, type 1)   . Hernia   . GERD (gastroesophageal reflux disease)   . Jaundice    Past Surgical History  Procedure Laterality Date  . Hernia repair      As infant.  . Bile duct stent placement  2006 at [facility]  . Pancreatic cyst drainage  at age 59 year   Family History  Problem Relation Age of Onset  . Pancreatitis Father 30    hereditary  . Hypertension Father   . GER disease Father   . Breast cancer Maternal Aunt   . Congenital heart disease Maternal Grandfather   . Prostate cancer Maternal Grandfather    History  Substance Use Topics  . Smoking status: Never Smoker   . Smokeless tobacco: Never Used  . Alcohol Use: No   OB History    No data available      Review of Systems  Constitutional: Negative for fever and chills.  HENT: Negative for rhinorrhea and sore throat.   Eyes: Negative for visual disturbance.  Respiratory: Negative for cough and shortness of breath.   Cardiovascular: Negative for chest pain and leg swelling.  Gastrointestinal: Positive for nausea, vomiting, abdominal pain and diarrhea.  Genitourinary: Negative for difficulty urinating.  Musculoskeletal: Negative for back pain and neck pain.  Skin: Negative for rash.  All other systems reviewed and are negative.  Allergies  Cefzil; Shellfish allergy; and Sulfa antibiotics  Home Medications   Prior to Admission medications   Medication Sig Start Date End Date Taking? Authorizing Provider  ferrous sulfate 325 (65 FE) MG tablet Take 325 mg by mouth daily with breakfast.   Yes Historical Provider, MD  hyoscyamine (LEVSIN SL) 0.125 MG SL tablet PLACE 1 TABLET UNDER THE TONGUE EVERY 4 HOURS AS NEEDED FOR PAIN. 08/04/14  Yes [name], MD  ibuprofen (ADVIL,MOTRIN) 200 MG tablet Take 400 mg by mouth every 6 (six) hours as needed. pain   Yes Historical Provider, MD  insulin aspart (NOVOLOG) 100 UNIT/ML injection Inject 6 Units into the skin 3 (three) times daily before meals. *Increase subcutaneous injection by 1 unit for every 50 units over 150.* 04/02/13  Yes [name], MD  Insulin Glargine (TOUJEO SOLOSTAR) 300 UNIT/ML SOPN Inject  30 Units into the skin every evening.   Yes Historical Provider, MD  Multiple Vitamins-Minerals (MULTIVITAMINS THER. W/MINERALS) TABS Take 1 tablet by mouth daily. For energy   Yes Historical Provider, MD  omeprazole (PRILOSEC) 20 MG capsule Take 1 capsule (20 mg total) by mouth daily. 08/04/14  Yes [name], MD  oxyCODONE-acetaminophen (PERCOCET) 7.5-325 MG per tablet Take one tablet up to BID prn pain 08/04/14  Yes [name], MD  Pancrelipase, Lip-Prot-Amyl, 6000 UNITS CPEP Take 2 capsules (12,000 Units total) by mouth 3  (three) times daily before meals. 08/04/14  Yes [name], MD  promethazine (PHENERGAN) 25 MG tablet TAKE 1 TABLET BY MOUTH EVERY 6 HOURS AS NEEDED FOR NAUSEA. 08/21/14  Yes [name], MD  Vitamin D, Ergocalciferol, (DRISDOL) 50000 UNITS CAPS Take 50,000 Units by mouth every 7 (seven) days. Takes on Wednesdays. 09/15/12  Yes Historical Provider, MD   BP 128/82 mmHg  Pulse 122  Temp(Src) 98 F (36.7 C) (Oral)  Resp 18  Ht 5\' 2"  (1.575 m)  Wt 120 lb (54.432 kg)  BMI 21.94 kg/m2  SpO2 100%  LMP 08/23/2014  Physical Exam  Constitutional: She appears well-developed and well-nourished. No distress.  Bucket of vomit noted at bedside  HENT:  Head: Normocephalic and atraumatic.  Mouth/Throat: Mucous membranes are dry.  Eyes: Conjunctivae are normal. Right eye exhibits no discharge. Left eye exhibits no discharge.  Neck: Neck supple.  Cardiovascular: Regular rhythm and normal heart sounds.  Tachycardia present.  Exam reveals no gallop and no friction rub.   No murmur heard. Pulmonary/Chest: Effort normal and breath sounds normal. No respiratory distress. She has no wheezes. She has no rales.  Abdominal: Soft. She exhibits no distension. There is tenderness in the epigastric area and left upper quadrant. There is no rebound and no guarding.  Musculoskeletal: She exhibits no edema or tenderness.  Neurological: She is alert.  Skin: Skin is warm and dry.  Psychiatric: She has a normal mood and affect. Her behavior is normal. Thought content normal.  Nursing note and vitals reviewed.   ED Course  Procedures (including critical care time)  7:31 AM- Pt and mother advised of plan for treatment and they agree.  Labs Review Labs Reviewed  COMPREHENSIVE METABOLIC PANEL - Abnormal; Notable for the following:    Sodium 134 (*)    CO2 11 (*)    Glucose, Bld 415 (*)    Total Protein 9.4 (*)    Albumin 5.3 (*)    Total Bilirubin 1.6 (*)    GFR calc non Af Amer 76 (*)    GFR calc Af  Amer 88 (*)    All other components within normal limits  CBC WITH DIFFERENTIAL/PLATELET - Abnormal; Notable for the following:    WBC 14.9 (*)    Platelets 456 (*)    Neutrophils Relative % 86 (*)    Neutro Abs 12.7 (*)    Lymphocytes Relative 10 (*)    All other components within normal limits  URINALYSIS, ROUTINE W REFLEX MICROSCOPIC - Abnormal; Notable for the following:    Specific Gravity, Urine >1.030 (*)    Glucose, UA 500 (*)    Ketones, ur >80 (*)    All other components within normal limits  BASIC METABOLIC PANEL - Abnormal; Notable for the following:    CO2 9 (*)    Glucose, Bld 343 (*)    GFR calc non Af Amer 86 (*)    Anion gap 18 (*)  All other components within normal limits  BASIC METABOLIC PANEL - Abnormal; Notable for the following:    CO2 14 (*)    Glucose, Bld 172 (*)    All other components within normal limits  GLUCOSE, CAPILLARY - Abnormal; Notable for the following:    Glucose-Capillary 326 (*)    All other components within normal limits  GLUCOSE, CAPILLARY - Abnormal; Notable for the following:    Glucose-Capillary 221 (*)    All other components within normal limits  GLUCOSE, CAPILLARY - Abnormal; Notable for the following:    Glucose-Capillary 194 (*)    All other components within normal limits  GLUCOSE, CAPILLARY - Abnormal; Notable for the following:    Glucose-Capillary 151 (*)    All other components within normal limits  COMPREHENSIVE METABOLIC PANEL - Abnormal; Notable for the following:    Potassium 3.4 (*)    Creatinine, Ser 0.48 (*)    All other components within normal limits  CBC - Abnormal; Notable for the following:    Hemoglobin 11.8 (*)    HCT 35.3 (*)    All other components within normal limits  BASIC METABOLIC PANEL - Abnormal; Notable for the following:    CO2 18 (*)    Glucose, Bld 135 (*)    All other components within normal limits  GLUCOSE, CAPILLARY - Abnormal; Notable for the following:    Glucose-Capillary 167  (*)    All other components within normal limits  GLUCOSE, CAPILLARY - Abnormal; Notable for the following:    Glucose-Capillary 167 (*)    All other components within normal limits  GLUCOSE, CAPILLARY - Abnormal; Notable for the following:    Glucose-Capillary 200 (*)    All other components within normal limits  GLUCOSE, CAPILLARY - Abnormal; Notable for the following:    Glucose-Capillary 209 (*)    All other components within normal limits  GLUCOSE, CAPILLARY - Abnormal; Notable for the following:    Glucose-Capillary 157 (*)    All other components within normal limits  GLUCOSE, CAPILLARY - Abnormal; Notable for the following:    Glucose-Capillary 133 (*)    All other components within normal limits  CBG MONITORING, ED - Abnormal; Notable for the following:    Glucose-Capillary 337 (*)    All other components within normal limits  CBG MONITORING, ED - Abnormal; Notable for the following:    Glucose-Capillary 352 (*)    All other components within normal limits  MRSA PCR SCREENING  LIPASE, BLOOD  PREGNANCY, URINE  GLUCOSE, CAPILLARY  GLUCOSE, CAPILLARY    Imaging Review No results found.   EKG Interpretation None      MDM   Final diagnoses:  Diabetic ketoacidosis without coma associated with type 1 diabetes mellitus    Patient presents with DKA. History of same. Will admit  I personally performed the services described in this documentation, which was scribed in my presence. The recorded information has been reviewed and is accurate.     Linda Belling, MD 09/30/14 [phone]

## 2014-09-14 NOTE — Progress Notes (Signed)
[facility] Progress Note Patient Name: NAIJAH LACEK DOB: 1991/02/10 MRN: [id number]   Date of Service  09/14/2014  HPI/Events of Note  DKA, improved, eating, off insulin gtt  eICU Interventions  Change SSI to q4 overnight, then AC/HS in AM     Intervention Category Intermediate Interventions: Hyperglycemia - evaluation and treatment  Sarrah Fiorenza 09/14/2014, 7:41 PM

## 2014-09-15 DIAGNOSIS — E1010 Type 1 diabetes mellitus with ketoacidosis without coma: Principal | ICD-10-CM

## 2014-09-15 LAB — CBC
HCT: 35.3 % — ABNORMAL LOW (ref 36.0–46.0)
HEMOGLOBIN: 11.8 g/dL — AB (ref 12.0–15.0)
MCH: 29.8 pg (ref 26.0–34.0)
MCHC: 33.4 g/dL (ref 30.0–36.0)
MCV: 89.1 fL (ref 78.0–100.0)
Platelets: 363 10*3/uL (ref 150–400)
RBC: 3.96 MIL/uL (ref 3.87–5.11)
RDW: 12 % (ref 11.5–15.5)
WBC: 9.9 10*3/uL (ref 4.0–10.5)

## 2014-09-15 LAB — COMPREHENSIVE METABOLIC PANEL
ALK PHOS: 68 U/L (ref 39–117)
ALT: 13 U/L (ref 0–35)
AST: 17 U/L (ref 0–37)
Albumin: 3.8 g/dL (ref 3.5–5.2)
Anion gap: 8 (ref 5–15)
BUN: 10 mg/dL (ref 6–23)
CO2: 21 mmol/L (ref 19–32)
Calcium: 9 mg/dL (ref 8.4–10.5)
Chloride: 109 mmol/L (ref 96–112)
Creatinine, Ser: 0.48 mg/dL — ABNORMAL LOW (ref 0.50–1.10)
GLUCOSE: 83 mg/dL (ref 70–99)
POTASSIUM: 3.4 mmol/L — AB (ref 3.5–5.1)
SODIUM: 138 mmol/L (ref 135–145)
Total Bilirubin: 0.8 mg/dL (ref 0.3–1.2)
Total Protein: 6.9 g/dL (ref 6.0–8.3)

## 2014-09-15 LAB — GLUCOSE, CAPILLARY
GLUCOSE-CAPILLARY: 133 mg/dL — AB (ref 70–99)
GLUCOSE-CAPILLARY: 157 mg/dL — AB (ref 70–99)
Glucose-Capillary: 167 mg/dL — ABNORMAL HIGH (ref 70–99)
Glucose-Capillary: 167 mg/dL — ABNORMAL HIGH (ref 70–99)
Glucose-Capillary: 200 mg/dL — ABNORMAL HIGH (ref 70–99)
Glucose-Capillary: 209 mg/dL — ABNORMAL HIGH (ref 70–99)
Glucose-Capillary: 89 mg/dL (ref 70–99)
Glucose-Capillary: 94 mg/dL (ref 70–99)

## 2014-09-15 MED ORDER — POTASSIUM CHLORIDE CRYS ER 20 MEQ PO TBCR
40.0000 meq | EXTENDED_RELEASE_TABLET | Freq: Once | ORAL | Status: AC
Start: 2014-09-15 — End: 2014-09-15
  Administered 2014-09-15: 40 meq via ORAL
  Filled 2014-09-15: qty 2

## 2014-09-15 NOTE — Care Management Note (Signed)
    Page 1 of 1   09/15/2014     2:06:45 PM CARE MANAGEMENT NOTE 09/15/2014  Patient:  Linda Walls, Linda Walls   Account Number:  [id number]  Date Initiated:  09/15/2014  Documentation initiated by:  Jolene Provost  Subjective/Objective Assessment:   Pt is from home, lives with parents. Pt independent at baseline. pt has no HH services or DME's prior to admission. Pt plans to discharge home with self care today. No CM needs.     Action/Plan:   Anticipated DC Date:  09/15/2014   Anticipated DC Plan:  HOME/SELF CARE      DC Planning Services  CM consult      Choice offered to / List presented to:             Status of service:  Completed, signed off Medicare Important Message given?   (If response is "NO", the following Medicare IM given date fields will be blank) Date Medicare IM given:   Medicare IM given by:   Date Additional Medicare IM given:   Additional Medicare IM given by:    Discharge Disposition:  HOME/SELF CARE  Per UR Regulation:  Reviewed for med. necessity/level of care/duration of stay  If discussed at [place] of Stay Meetings, dates discussed:    Comments:  09/15/2014 0900 Jolene Provost, RN, MSN, CM

## 2014-09-15 NOTE — Progress Notes (Signed)
Discharge instructions reviewed with patient and mother. Understanding verbalized. Taken out via wheelchair for discharge home.

## 2014-09-15 NOTE — Progress Notes (Signed)
Inpatient Diabetes Program Recommendations  AACE/ADA: New Consensus Statement on Inpatient Glycemic Control (2013)  Target Ranges:  Prepandial:   less than 140 mg/dL      Peak postprandial:   less than 180 mg/dL (1-2 hours)      Critically ill patients:  140 - 180 mg/dL   Results for JALEYA, PEBLEY (MRN [id number]) as of 09/15/2014 10:08  Ref. Range 09/14/2014 07:32 09/14/2014 09:19 09/14/2014 10:33 09/14/2014 12:01 09/14/2014 13:10 09/14/2014 14:25 09/15/2014 04:27 09/15/2014 07:56  Glucose-Capillary Latest Range: 70-99 mg/dL 337 (H) 352 (H) 326 (H) 221 (H) 194 (H) 151 (H) 89 94    Diabetes history: DM1 Outpatient Diabetes medications: Toujeo 30 units QHS, Novolog 12 units TID with meals plus Novolog correction (1 unit for every 50 mg/dl over target) Current orders for Inpatient glycemic control: Lantus 30 units QHS, Novolog 0-9 units Q4H  Inpatient Diabetes Program Recommendations Correction (SSI): Now that patient is eating and tolerating diet, please consider changing frequency of CBGs and Novolog to ACHS. Insulin - Meal Coverage: Please consider ordering Novolog meal coverage in addition to Novolog correction. Currently as an outpatient, patient takes Novolog 12 units TID with meals. May want to consider ordering Novolog 6 units TID with meals for meal coverage if patient is eating at least 50% of meals.  Note: Spoke with patient and her mother about diabetes and home regimen for diabetes control. 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 Patient is followed by Dr. Dorris Fetch for diabetes management and currently she takes Toujeo 30 units QHS, Novolog 12 units TID with meals for meal coverage plus Novolog correction (1 unit for every 50 mg/dl above target glucose)  as an outpatient for diabetes control. Patient has been seeing Dr. Dorris Fetch for 3 years and she reports that it has been difficult to get her  A1C down. Patient states that she checks her glucose 4 times per day and it is usually less than 200 mg/dl. Patient states that she rarely has any episodes of hypoglycemia. According to the patient her A1C has been elevated for a very long time with the lowest A1C being 8.4%. Patient states that her last A1C was over 10% despite her glucose trends in the 100's mg/dl for the most part.  Talked with patient about continuous glucose monitoring and insulin pumps. Patient states that she is interested on going on an insulin pump but Dr. Dorris Fetch wants to get her A1C better before moving forward with an insulin pump. Patient states that she has been reading about research being done with the artificial pancreas and Dr. Dorris Fetch has also talked about it with her also. Patient verbalized understanding of information discussed and she states that she has no further questions at this time related to diabetes. Patient and her mother appear to be very knowledgeable about diabetes and report they are seeing Dr. Dorris Fetch on a 4 week basis right know trying to get her A1C better.   Thanks, Barnie Alderman, RN, MSN, CCRN Diabetes Coordinator Inpatient Diabetes Program [phone] (Team Pager) [phone] ([facility] office) [phone] [facility] office)

## 2014-09-15 NOTE — Discharge Summary (Signed)
Physician Discharge Summary  Linda Walls ENI:[id number] DOB: 1991/03/23 DOA: 09/14/2014  PCP: [name], MD  Admit date: 09/14/2014 Discharge date: 09/15/2014  Time spent: 20 minutes  Recommendations for Outpatient Follow-up:  1. Follow up with PCP in 1 week 2. Follow up with primary endocrinologist as scheduled  Discharge Diagnoses:  Principal Problem:   DKA (diabetic ketoacidoses) Active Problems:   Hereditary pancreatitis   DM type 1 (diabetes mellitus, type 1)   Dehydration   Discharge Condition: Improved  Diet recommendation: Diabetic  Filed Weights   09/14/14 0659 09/14/14 0959 09/15/14 0500  Weight: 54.432 kg (120 lb) 53.4 kg (117 lb 11.6 oz) 54.3 kg (119 lb 11.4 oz)    History of present illness:  Please review h and p from 3/17 for details. Briefly, pt presented with abd pain and found to be in DKA. The patient was admitted for further work up.  Hospital Course:  1. DKA 1. Elevated glucose with ketones in urine and GAP of 24 on admit 2. Pt was cont on IVF with insulin gtt 3. GAP closed and patient was transitioned off insulin gtt 4. Blood glucose remained stable and controlled thereafter 5. Pt is to follow up with her PCP and endocrinologist as scheduled 2. Hx Hereditary Pancreatitis 1. stable 3. DM 1 1. Glucose management per above 4. Dehydration 1. Continue on aggressive IVF as tolerated 5. Abd pain 1. Suspect secondary to above DKA vs constipation 2. Abd xray without bowel obstruction, however stool was noted throughout colon per my own read 3. Pt was given a trial of linzess with laxative without resultant bowel movement 4. Patient states she will take OTC meds to ensure bowel movement no discharge 5. Pt is tolerating diet 6. DVT prophylaxis 1. Heparin subQ while inpatient  Consultations:  none  Discharge Exam: Filed Vitals:   09/15/14 0500 09/15/14 0600 09/15/14 0700 09/15/14 0800  BP: 90/50 93/51 96/58  102/52  Pulse: 72 70 74 92   Temp: 98.2 F (36.8 C)     TempSrc: Oral     Resp: 19 14 16 14   Height:      Weight: 54.3 kg (119 lb 11.4 oz)     SpO2: 98% 98% 98% 98%    General: Awake, in nad Cardiovascular: regular, s1, s2 Respiratory: normal resp effort, no wheezing  Discharge Instructions     Medication List    TAKE these medications        ferrous sulfate 325 (65 FE) MG tablet  Take 325 mg by mouth daily with breakfast.     hyoscyamine 0.125 MG SL tablet  Commonly known as:  LEVSIN SL  PLACE 1 TABLET UNDER THE TONGUE EVERY 4 HOURS AS NEEDED FOR PAIN.     ibuprofen 200 MG tablet  Commonly known as:  ADVIL,MOTRIN  Take 400 mg by mouth every 6 (six) hours as needed. pain     insulin aspart 100 UNIT/ML injection  Commonly known as:  novoLOG  Inject 6 Units into the skin 3 (three) times daily before meals. *Increase subcutaneous injection by 1 unit for every 50 units over 150.*     multivitamins ther. w/minerals Tabs tablet  Take 1 tablet by mouth daily. For energy     omeprazole 20 MG capsule  Commonly known as:  PRILOSEC  Take 1 capsule (20 mg total) by mouth daily.     oxyCODONE-acetaminophen 7.5-325 MG per tablet  Commonly known as:  PERCOCET  Take one tablet up to BID prn pain  Pancrelipase (Lip-Prot-Amyl) 6000 UNITS Cpep  Take 2 capsules (12,000 Units total) by mouth 3 (three) times daily before meals.     promethazine 25 MG tablet  Commonly known as:  PHENERGAN  TAKE 1 TABLET BY MOUTH EVERY 6 HOURS AS NEEDED FOR NAUSEA.     TOUJEO SOLOSTAR 300 UNIT/ML Sopn  Generic drug:  Insulin Glargine  Inject 30 Units into the skin every evening.     Vitamin D (Ergocalciferol) 50000 UNITS Caps capsule  Commonly known as:  DRISDOL  Take 50,000 Units by mouth every 7 (seven) days. Takes on Wednesdays.       Allergies  Allergen Reactions  . Cefzil [Cefprozil]     Sickness  . Shellfish Allergy Nausea And Vomiting  . Sulfa Antibiotics Rash   Follow-up Information    Follow up with  [name], MD In 1 week.   Specialty:  Family Medicine   Contact information:   [address] [place] [address] [phone]        The results of significant diagnostics from this hospitalization (including imaging, microbiology, ancillary and laboratory) are listed below for reference.    Significant Diagnostic Studies: Dg Abd Portable 1v  09/14/2014   CLINICAL DATA:  Generalized abdominal pain.  History pancreatitis.  EXAM: PORTABLE ABDOMEN - 1 VIEW  COMPARISON:  CT 04/01/2013  FINDINGS: Bowel gas pattern is nonobstructive. No dilated loops of bowel, air-fluid levels or free peritoneal air. Remaining bones and soft tissues are within normal.  IMPRESSION: Negative.   Electronically Signed   By: Marin Olp M.D.   On: 09/14/2014 10:32    Microbiology: Recent Results (from the past 240 hour(s))  MRSA PCR Screening     Status: None   Collection Time: 09/14/14 11:00 AM  Result Value Ref Range Status   MRSA by PCR NEGATIVE NEGATIVE Final    Comment:        The GeneXpert MRSA Assay (FDA approved for NASAL specimens only), is one component of a comprehensive MRSA colonization surveillance program. It is not intended to diagnose MRSA infection nor to guide or monitor treatment for MRSA infections.      Labs: Basic Metabolic Panel:  Recent Labs Lab 09/14/14 0731 09/14/14 1017 09/14/14 1351 09/14/14 2032 09/15/14 0446  NA 134* 136 137 135 138  K 5.0 5.0 4.3 3.8 3.4*  CL 99 109 112 109 109  CO2 11* 9* 14* 18* 21  GLUCOSE 415* 343* 172* 135* 83  BUN 20 19 15 11 10   CREATININE 1.04 0.94 0.71 0.53 0.48*  CALCIUM 10.3 9.1 9.1 9.4 9.0   Liver Function Tests:  Recent Labs Lab 09/14/14 0731 09/15/14 0446  AST 23 17  ALT 18 13  ALKPHOS 100 68  BILITOT 1.6* 0.8  PROT 9.4* 6.9  ALBUMIN 5.3* 3.8    Recent Labs Lab 09/14/14 0731  LIPASE 13   No results for input(s): AMMONIA in the last 168 hours. CBC:  Recent Labs Lab 09/14/14 0731  09/15/14 0446  WBC 14.9* 9.9  NEUTROABS 12.7*  --   HGB 14.9 11.8*  HCT 43.7 35.3*  MCV 91.2 89.1  PLT 456* 363   Cardiac Enzymes: No results for input(s): CKTOTAL, CKMB, CKMBINDEX, TROPONINI in the last 168 hours. BNP: BNP (last 3 results) No results for input(s): BNP in the last 8760 hours.  ProBNP (last 3 results) No results for input(s): PROBNP in the last 8760 hours.  CBG:  Recent Labs Lab 09/14/14 1201 09/14/14 1310 09/14/14 1425  09/15/14 0427 09/15/14 0756  GLUCAP 221* 194* 151* 89 94   Signed:  Kennie Snedden K  [facility] 09/15/2014, 9:20 AM

## 2014-09-27 ENCOUNTER — Ambulatory Visit: Payer: BC Managed Care – PPO | Admitting: Family Medicine

## 2014-10-15 ENCOUNTER — Encounter (HOSPITAL_COMMUNITY): Payer: Self-pay | Admitting: Emergency Medicine

## 2014-10-15 ENCOUNTER — Inpatient Hospital Stay (HOSPITAL_COMMUNITY)
Admission: EM | Admit: 2014-10-15 | Discharge: 2014-10-18 | DRG: 639 | Disposition: A | Discharge status: Home / Self Care | Payer: BC Managed Care – PPO | Attending: Internal Medicine | Admitting: Internal Medicine

## 2014-10-15 DIAGNOSIS — Z882 Allergy status to sulfonamides status: Secondary | ICD-10-CM | POA: Diagnosis not present

## 2014-10-15 DIAGNOSIS — R112 Nausea with vomiting, unspecified: Secondary | ICD-10-CM | POA: Diagnosis present

## 2014-10-15 DIAGNOSIS — K219 Gastro-esophageal reflux disease without esophagitis: Secondary | ICD-10-CM | POA: Diagnosis present

## 2014-10-15 DIAGNOSIS — K859 Acute pancreatitis without necrosis or infection, unspecified: Secondary | ICD-10-CM | POA: Diagnosis present

## 2014-10-15 DIAGNOSIS — E109 Type 1 diabetes mellitus without complications: Secondary | ICD-10-CM | POA: Diagnosis present

## 2014-10-15 DIAGNOSIS — E1010 Type 1 diabetes mellitus with ketoacidosis without coma: Secondary | ICD-10-CM | POA: Diagnosis present

## 2014-10-15 DIAGNOSIS — E0810 Diabetes mellitus due to underlying condition with ketoacidosis without coma: Secondary | ICD-10-CM

## 2014-10-15 DIAGNOSIS — Z888 Allergy status to other drugs, medicaments and biological substances status: Secondary | ICD-10-CM

## 2014-10-15 DIAGNOSIS — R109 Unspecified abdominal pain: Secondary | ICD-10-CM | POA: Diagnosis not present

## 2014-10-15 DIAGNOSIS — Z91013 Allergy to seafood: Secondary | ICD-10-CM

## 2014-10-15 DIAGNOSIS — E876 Hypokalemia: Secondary | ICD-10-CM | POA: Diagnosis present

## 2014-10-15 DIAGNOSIS — Z794 Long term (current) use of insulin: Secondary | ICD-10-CM

## 2014-10-15 DIAGNOSIS — E1110 Type 2 diabetes mellitus with ketoacidosis without coma: Secondary | ICD-10-CM | POA: Diagnosis present

## 2014-10-15 LAB — BASIC METABOLIC PANEL
Anion gap: 13 (ref 5–15)
Anion gap: 6 (ref 5–15)
BUN: 26 mg/dL — AB (ref 6–23)
BUN: 23 mg/dL (ref 6–23)
CHLORIDE: 117 mmol/L — AB (ref 96–112)
CO2: 10 mmol/L — CL (ref 19–32)
CO2: 17 mmol/L — ABNORMAL LOW (ref 19–32)
CREATININE: 0.66 mg/dL (ref 0.50–1.10)
Calcium: 9.5 mg/dL (ref 8.4–10.5)
Calcium: 9.6 mg/dL (ref 8.4–10.5)
Chloride: 118 mmol/L — ABNORMAL HIGH (ref 96–112)
Creatinine, Ser: 0.76 mg/dL (ref 0.50–1.10)
GFR calc non Af Amer: >90 mL/min (ref >90)
GFR calc non Af Amer: >90 mL/min (ref >90)
GLUCOSE: 127 mg/dL — AB (ref 70–99)
Glucose, Bld: 205 mg/dL — ABNORMAL HIGH (ref 70–99)
POTASSIUM: 4.8 mmol/L (ref 3.5–5.1)
Potassium: 3.6 mmol/L (ref 3.5–5.1)
SODIUM: 140 mmol/L (ref 135–145)
SODIUM: 141 mmol/L (ref 135–145)

## 2014-10-15 LAB — COMPREHENSIVE METABOLIC PANEL
ALK PHOS: 118 U/L — AB (ref 39–117)
ALT: 18 U/L (ref 0–35)
ANION GAP: 15 (ref 5–15)
AST: 17 U/L (ref 0–37)
Albumin: 5.2 g/dL (ref 3.5–5.2)
BUN: 30 mg/dL — AB (ref 6–23)
CO2: 12 mmol/L — ABNORMAL LOW (ref 19–32)
Calcium: 10.9 mg/dL — ABNORMAL HIGH (ref 8.4–10.5)
Chloride: 108 mmol/L (ref 96–112)
Creatinine, Ser: 0.97 mg/dL (ref 0.50–1.10)
GFR calc Af Amer: >90 mL/min (ref >90)
GFR, EST NON AFRICAN AMERICAN: 83 mL/min — AB (ref >90)
GLUCOSE: 267 mg/dL — AB (ref 70–99)
Potassium: 4.2 mmol/L (ref 3.5–5.1)
SODIUM: 135 mmol/L (ref 135–145)
Total Bilirubin: 0.8 mg/dL (ref 0.3–1.2)
Total Protein: 9.5 g/dL — ABNORMAL HIGH (ref 6.0–8.3)

## 2014-10-15 LAB — URINE MICROSCOPIC-ADD ON

## 2014-10-15 LAB — CBC WITH DIFFERENTIAL/PLATELET
Basophils Absolute: 0 10*3/uL (ref 0.0–0.1)
Basophils Relative: 0 % (ref 0–1)
Eosinophils Absolute: 0 10*3/uL (ref 0.0–0.7)
Eosinophils Relative: 0 % (ref 0–5)
HCT: 47.9 % — ABNORMAL HIGH (ref 36.0–46.0)
HEMOGLOBIN: 16.7 g/dL — AB (ref 12.0–15.0)
LYMPHS PCT: 8 % — AB (ref 12–46)
Lymphs Abs: 1.7 10*3/uL (ref 0.7–4.0)
MCH: 30.6 pg (ref 26.0–34.0)
MCHC: 34.9 g/dL (ref 30.0–36.0)
MCV: 87.7 fL (ref 78.0–100.0)
MONO ABS: 1.7 10*3/uL — AB (ref 0.1–1.0)
MONOS PCT: 8 % (ref 3–12)
NEUTROS ABS: 17.3 10*3/uL — AB (ref 1.7–7.7)
Neutrophils Relative %: 84 % — ABNORMAL HIGH (ref 43–77)
Platelets: 428 10*3/uL — ABNORMAL HIGH (ref 150–400)
RBC: 5.46 MIL/uL — ABNORMAL HIGH (ref 3.87–5.11)
RDW: 12.7 % (ref 11.5–15.5)
WBC: 20.7 10*3/uL — ABNORMAL HIGH (ref 4.0–10.5)

## 2014-10-15 LAB — URINALYSIS, ROUTINE W REFLEX MICROSCOPIC
KETONES UR: 40 mg/dL — AB
Leukocytes, UA: NEGATIVE
Nitrite: NEGATIVE
Protein, ur: 100 mg/dL — AB
Specific Gravity, Urine: >1.03 — ABNORMAL HIGH (ref 1.005–1.030)
Urobilinogen, UA: 0.2 mg/dL (ref 0.0–1.0)
pH: 6 (ref 5.0–8.0)

## 2014-10-15 LAB — CBG MONITORING, ED
GLUCOSE-CAPILLARY: 173 mg/dL — AB (ref 70–99)
Glucose-Capillary: 251 mg/dL — ABNORMAL HIGH (ref 70–99)

## 2014-10-15 LAB — LIPASE, BLOOD: LIPASE: 16 U/L (ref 11–59)

## 2014-10-15 LAB — GLUCOSE, CAPILLARY
GLUCOSE-CAPILLARY: 219 mg/dL — AB (ref 70–99)
GLUCOSE-CAPILLARY: 135 mg/dL — AB (ref 70–99)
Glucose-Capillary: 251 mg/dL — ABNORMAL HIGH (ref 70–99)
Glucose-Capillary: 265 mg/dL — ABNORMAL HIGH (ref 70–99)
Glucose-Capillary: 117 mg/dL — ABNORMAL HIGH (ref 70–99)

## 2014-10-15 LAB — PREGNANCY, URINE: Preg Test, Ur: NEGATIVE

## 2014-10-15 LAB — MRSA PCR SCREENING: MRSA by PCR: NEGATIVE

## 2014-10-15 MED ORDER — PANCRELIPASE (LIP-PROT-AMYL) 12000-38000 UNITS PO CPEP
12000.0000 [IU] | ORAL_CAPSULE | Freq: Three times a day (TID) | ORAL | Status: DC
  Administered 2014-10-16 – 2014-10-18 (×6): 12000 [IU] via ORAL
  Filled 2014-10-15 (×7): qty 1

## 2014-10-15 MED ORDER — CETYLPYRIDINIUM CHLORIDE 0.05 % MT LIQD
7.0000 mL | Freq: Two times a day (BID) | OROMUCOSAL | Status: DC
  Administered 2014-10-15 – 2014-10-17 (×4): 7 mL via OROMUCOSAL

## 2014-10-15 MED ORDER — FENTANYL CITRATE (PF) 100 MCG/2ML IJ SOLN
100.0000 ug | Freq: Once | INTRAMUSCULAR | Status: AC
  Administered 2014-10-15: 100 ug via INTRAVENOUS
  Filled 2014-10-15: qty 2

## 2014-10-15 MED ORDER — SODIUM CHLORIDE 0.9 % IV BOLUS (SEPSIS)
1000.0000 mL | Freq: Once | INTRAVENOUS | Status: AC
  Administered 2014-10-15: 1000 mL via INTRAVENOUS

## 2014-10-15 MED ORDER — HYDROMORPHONE HCL 1 MG/ML IJ SOLN
0.5000 mg | INTRAMUSCULAR | Status: DC | PRN
  Administered 2014-10-16: 0.5 mg via INTRAVENOUS
  Filled 2014-10-15: qty 1

## 2014-10-15 MED ORDER — SODIUM CHLORIDE 0.9 % IV SOLN: INTRAVENOUS | Status: DC

## 2014-10-15 MED ORDER — PANTOPRAZOLE SODIUM 40 MG PO TBEC
40.0000 mg | DELAYED_RELEASE_TABLET | Freq: Every day | ORAL | Status: DC
  Administered 2014-10-16 – 2014-10-18 (×2): 40 mg via ORAL
  Filled 2014-10-15 (×3): qty 1

## 2014-10-15 MED ORDER — PROMETHAZINE HCL 25 MG/ML IJ SOLN
12.5000 mg | Freq: Four times a day (QID) | INTRAMUSCULAR | Status: DC | PRN
  Administered 2014-10-16 – 2014-10-18 (×8): 12.5 mg via INTRAVENOUS
  Filled 2014-10-15 (×8): qty 1

## 2014-10-15 MED ORDER — HYDROMORPHONE HCL 1 MG/ML IJ SOLN
2.0000 mg | INTRAMUSCULAR | Status: DC | PRN
  Administered 2014-10-15: 2 mg via INTRAVENOUS
  Filled 2014-10-15: qty 2

## 2014-10-15 MED ORDER — PANCRELIPASE (LIP-PROT-AMYL) 6000-19000 UNITS PO CPEP: 2.0000 | ORAL_CAPSULE | Freq: Three times a day (TID) | ORAL | Status: DC

## 2014-10-15 MED ORDER — PNEUMOCOCCAL VAC POLYVALENT 25 MCG/0.5ML IJ INJ
0.5000 mL | INJECTION | INTRAMUSCULAR | Status: AC
  Administered 2014-10-16: 0.5 mL via INTRAMUSCULAR
  Filled 2014-10-15: qty 0.5

## 2014-10-15 MED ORDER — SODIUM CHLORIDE 0.9 % IV SOLN
INTRAVENOUS | Status: DC
  Administered 2014-10-15: 1.9 [IU]/h via INTRAVENOUS
  Filled 2014-10-15: qty 2.5

## 2014-10-15 MED ORDER — SODIUM CHLORIDE 0.9 % IV SOLN
INTRAVENOUS | Status: DC
  Filled 2014-10-15: qty 2.5

## 2014-10-15 MED ORDER — PROMETHAZINE HCL 25 MG/ML IJ SOLN
12.5000 mg | Freq: Once | INTRAMUSCULAR | Status: AC
  Administered 2014-10-15: 12.5 mg via INTRAVENOUS
  Filled 2014-10-15: qty 1

## 2014-10-15 MED ORDER — ENOXAPARIN SODIUM 40 MG/0.4ML ~~LOC~~ SOLN
40.0000 mg | SUBCUTANEOUS | Status: DC
  Administered 2014-10-15 – 2014-10-17 (×3): 40 mg via SUBCUTANEOUS
  Filled 2014-10-15 (×3): qty 0.4

## 2014-10-15 MED ORDER — DIPHENHYDRAMINE HCL 25 MG PO CAPS
25.0000 mg | ORAL_CAPSULE | Freq: Four times a day (QID) | ORAL | Status: DC | PRN
  Administered 2014-10-15 – 2014-10-17 (×2): 25 mg via ORAL
  Filled 2014-10-15 (×3): qty 1

## 2014-10-15 MED ORDER — DEXTROSE-NACL 5-0.45 % IV SOLN
INTRAVENOUS | Status: DC
  Administered 2014-10-15 – 2014-10-16 (×2): via INTRAVENOUS

## 2014-10-15 MED ORDER — DEXTROSE-NACL 5-0.45 % IV SOLN
INTRAVENOUS | Status: DC
  Administered 2014-10-15: 18:00:00 via INTRAVENOUS

## 2014-10-15 MED ORDER — SODIUM CHLORIDE 0.9 % IV SOLN
INTRAVENOUS | Status: DC
  Administered 2014-10-15 – 2014-10-16 (×2): via INTRAVENOUS

## 2014-10-15 NOTE — Progress Notes (Signed)
Dilaudid 2mg  given per MD order and patient request at 1800.  At 18:30 patient's heart rate increased to 140.  O2 @ 2lnc started. Stat EKG ordered.  MD notified. Patient stated she felt tight in her chest and dizzy.  Will notify MD of EKG results and continue to monitor patient.  Schonewitz, Eulis Canner 10/15/2014

## 2014-10-15 NOTE — Progress Notes (Signed)
MD came to floor to see results of EKG and BMET.  Ordered for insulin drip to be started at this time per glucostabilizer. Orders received and carried out. Schonewitz, Eulis Canner 10/15/2014

## 2014-10-15 NOTE — H&P (Addendum)
PCP:   [name], MD   Chief Complaint:  Abdominal pain  HPI: 24 year old female who   has a past medical history of Hereditary pancreatitis; DM type 1 (diabetes mellitus, type 1); Hernia; GERD (gastroesophageal reflux disease); and Jaundice. today presents to the hospital with chief computer for abdominal pain, nausea and vomiting for past 2 days. At home patient's blood sugar was elevated to 500s and as per patient's parents were unable to bring it less than 300 with insulin. Patient complains of loose stools, nausea and vomiting. She also complains of abdominal pain. She has a history of hereditary pancreatitis, lipase today is 16. She denies chest pain, no shortness of breath. No fever, no dysuria urgency frequency of urination. Patient blood sugar initially was found to be in 400s with bicarbonate of 12. AG of 15.  Allergies:   Allergies  Allergen Reactions  . Cefzil [Cefprozil]     Sickness  . Shellfish Allergy Nausea And Vomiting  . Sulfa Antibiotics Rash      Past Medical History  Diagnosis Date  . Hereditary pancreatitis   . DM type 1 (diabetes mellitus, type 1)   . Hernia   . GERD (gastroesophageal reflux disease)   . Jaundice     Past Surgical History  Procedure Laterality Date  . Hernia repair      As infant.  . Bile duct stent placement  2006 at [facility]  . Pancreatic cyst drainage  at age 21 year    Prior to Admission medications   Medication Sig Start Date End Date Taking? Authorizing Provider  ferrous sulfate 325 (65 FE) MG tablet Take 325 mg by mouth daily with breakfast.   Yes Historical Provider, MD  hyoscyamine (LEVSIN SL) 0.125 MG SL tablet PLACE 1 TABLET UNDER THE TONGUE EVERY 4 HOURS AS NEEDED FOR PAIN. Patient taking differently: Take 0.125 mg by mouth every 4 (four) hours as needed. PLACE 1 TABLET UNDER THE TONGUE EVERY 4 HOURS AS NEEDED FOR PAIN. 08/04/14  Yes [name], MD  ibuprofen (ADVIL,MOTRIN) 200 MG tablet Take 400 mg by mouth every  6 (six) hours as needed. pain   Yes Historical Provider, MD  insulin aspart (NOVOLOG) 100 UNIT/ML injection Inject 6 Units into the skin 3 (three) times daily before meals. *Increase subcutaneous injection by 1 unit for every 50 units over 150.* Patient taking differently: Inject 8 Units into the skin 3 (three) times daily before meals. *Increase subcutaneous injection by 1 unit for every 50 units over 150.* 04/02/13  Yes [name], MD  Insulin Glargine (TOUJEO SOLOSTAR) 300 UNIT/ML SOPN Inject 30 Units into the skin every evening.   Yes Historical Provider, MD  Multiple Vitamins-Minerals (MULTIVITAMINS THER. W/MINERALS) TABS Take 1 tablet by mouth daily. For energy   Yes Historical Provider, MD  omeprazole (PRILOSEC) 20 MG capsule Take 1 capsule (20 mg total) by mouth daily. Patient taking differently: Take 20 mg by mouth daily as needed (for acid reflux).  08/04/14  Yes [name], MD  oxyCODONE-acetaminophen (PERCOCET) 7.5-325 MG per tablet Take one tablet up to BID prn pain Patient taking differently: Take 1 tablet by mouth 2 (two) times daily as needed. Take one tablet up to BID prn pain 08/04/14  Yes [name], MD  Pancrelipase, Lip-Prot-Amyl, 6000 UNITS CPEP Take 2 capsules (12,000 Units total) by mouth 3 (three) times daily before meals. 08/04/14  Yes [name], MD  promethazine (PHENERGAN) 25 MG suppository Place 25 mg rectally  every 6 (six) hours as needed for nausea or vomiting.   Yes Historical Provider, MD  promethazine (PHENERGAN) 25 MG tablet TAKE 1 TABLET BY MOUTH EVERY 6 HOURS AS NEEDED FOR NAUSEA. 08/21/14  Yes [name], MD  vitamin C (ASCORBIC ACID) 500 MG tablet Take 500 mg by mouth daily.   Yes Historical Provider, MD    Social History:  reports that she has never smoked. She has never used smokeless tobacco. She reports that she does not drink alcohol or use illicit drugs.  Family History  Problem Relation Age of Onset  . Pancreatitis Father 30     hereditary  . Hypertension Father   . GER disease Father   . Breast cancer Maternal Aunt   . Congenital heart disease Maternal Grandfather   . Prostate cancer Maternal Grandfather      All the positives are listed in BOLD  Review of Systems:  HEENT: Headache, blurred vision, runny nose, sore throat Neck: Hypothyroidism, hyperthyroidism,,lymphadenopathy Chest : Shortness of breath, history of COPD, Asthma Heart : Chest pain, history of coronary arterey disease GI:  Nausea, vomiting, diarrhea, constipation, GERD GU: Dysuria, urgency, frequency of urination, hematuria Neuro: Stroke, seizures, syncope Psych: Depression, anxiety, hallucinations   Physical Exam: Blood pressure 133/91, pulse 121, temperature 98.4 F (36.9 C), resp. rate 21, height 5\' 2"  (1.575 m), weight 53.978 kg (119 lb), last menstrual period 09/24/2014, SpO2 98 %. Constitutional:   Patient is a well-developed and well-nourished female in no acute distress and cooperative with exam. Head: Normocephalic and atraumatic Mouth: Mucus membranes moist Eyes: PERRL, EOMI, conjunctivae normal Neck: Supple, No Thyromegaly Cardiovascular: RRR, S1 normal, S2 normal Pulmonary/Chest: CTAB, no wheezes, rales, or rhonchi Abdominal: Soft. Mild generalized tenderness to palpation, , non-distended, bowel sounds are normal, no masses, organomegaly, or guarding present.  Neurological: A&O x3, Strength is normal and symmetric bilaterally, cranial nerve II-XII are grossly intact, no focal motor deficit, sensory intact to light touch bilaterally.  Extremities : No Cyanosis, Clubbing or Edema  Labs on Admission:  Basic Metabolic Panel:  Recent Labs Lab 10/15/14 1450  NA 135  K 4.2  CL 108  CO2 12*  GLUCOSE 267*  BUN 30*  CREATININE 0.97  CALCIUM 10.9*   Liver Function Tests:  Recent Labs Lab 10/15/14 1450  AST 17  ALT 18  ALKPHOS 118*  BILITOT 0.8  PROT 9.5*  ALBUMIN 5.2    Recent Labs Lab 10/15/14 1450  LIPASE  16   No results for input(s): AMMONIA in the last 168 hours. CBC:  Recent Labs Lab 10/15/14 1450  WBC 20.7*  NEUTROABS 17.3*  HGB 16.7*  HCT 47.9*  MCV 87.7  PLT 428*    CBG:  Recent Labs Lab 10/15/14 1428  GLUCAP 251*    Radiological Exams on Admission: No results found.  EKG: Independently reviewed. Sinus tachycardia   Assessment/Plan Principal Problem:   DKA (diabetic ketoacidoses) Active Problems:   Hereditary pancreatitis   DM type 1 (diabetes mellitus, type 1)   GERD (gastroesophageal reflux disease)   Nausea and vomiting  DKA Patient presenting with early DKA, AG of 15 we'll start the patient on DKA protocol. Admitted to step down.   Abdominal pain/ nausea vomiting Likely viral  Patient has a history of hereditary pancreatitis. Lipase is 16. start Phenergan when necessary for nausea vomiting, Dilaudid when necessary for pain   Diabetes mellitus  Patient started on DKA protocol, can be restarted on Lantus as well as sliding scale once off  the insulin drip.   GERD  Continue by mouth Protonix   Leukocytosis Likely reactive, UA shows negative nitrite.   Code status: full code   Family discussion: Admission, patients condition and plan of care including tests being ordered have been discussed with the patient and her parents at bedside* who indicate understanding and agree with the plan and Code Status.   Time Spent on Admission 55 minutes   [facility] Pager: [phone] 10/15/2014, 4:56 PM  If 7PM-7AM, please contact night-coverage  [email or web address]  Password [email or web address]

## 2014-10-15 NOTE — ED Notes (Signed)
Pt reports vomiting yesterday, states today she cannot get her blood sugar under control. Pt states levels have been around 300-500 today normal for her is about 175. Pt reports body ahces today

## 2014-10-15 NOTE — Progress Notes (Signed)
Received patient to floor from ED.  ED nurse stated that she did not start insulin drip as previously ordered because patient's cbg=173.  MD notified and ordered to obtain stat BMET at this time and to hold off on starting insulin drip until BMET resulted.  Will call results to MD when BMET results. Schonewitz, Eulis Canner 10/15/2014

## 2014-10-15 NOTE — ED Provider Notes (Signed)
CSN: [id number]     Arrival date & time 10/15/14  1421 History   First MD Initiated Contact with Patient 10/15/14 1439     Chief Complaint  Patient presents with  . Emesis  . Hyperglycemia     (Consider location/radiation/quality/duration/timing/severity/associated sxs/prior Treatment) Patient is a 25 y.o. female presenting with vomiting and hyperglycemia. The history is provided by the patient.  Emesis Severity:  Moderate Associated symptoms: abdominal pain, headaches and myalgias   Associated symptoms: no diarrhea   Hyperglycemia Associated symptoms: abdominal pain, confusion, fatigue, increased thirst, nausea and vomiting   Associated symptoms: no polyuria    patient presents with abdominal pain nausea and vomiting for the last few days. Sugars have been up to 500. History of hereditary pancreatitis. No fevers. She is weak. Most of the history comes from her family members. No diarrhea. Occasional cough.  Past Medical History  Diagnosis Date  . Hereditary pancreatitis   . DM type 1 (diabetes mellitus, type 1)   . Hernia   . GERD (gastroesophageal reflux disease)   . Jaundice    Past Surgical History  Procedure Laterality Date  . Hernia repair      As infant.  . Bile duct stent placement  2006 at [facility]  . Pancreatic cyst drainage  at age 11 year   Family History  Problem Relation Age of Onset  . Pancreatitis Father 30    hereditary  . Hypertension Father   . GER disease Father   . Breast cancer Maternal Aunt   . Congenital heart disease Maternal Grandfather   . Prostate cancer Maternal Grandfather    History  Substance Use Topics  . Smoking status: Never Smoker   . Smokeless tobacco: Never Used  . Alcohol Use: No   OB History    No data available     Review of Systems  Constitutional: Positive for appetite change and fatigue.  HENT: Negative for facial swelling.   Gastrointestinal: Positive for nausea, vomiting and abdominal pain. Negative for diarrhea.   Endocrine: Positive for polydipsia. Negative for polyuria.  Genitourinary: Negative for flank pain.  Musculoskeletal: Positive for myalgias.  Neurological: Positive for headaches.  Psychiatric/Behavioral: Positive for confusion.      Allergies  Cefzil; Shellfish allergy; and Sulfa antibiotics  Home Medications   Prior to Admission medications   Medication Sig Start Date End Date Taking? Authorizing Provider  ferrous sulfate 325 (65 FE) MG tablet Take 325 mg by mouth daily with breakfast.   Yes Historical Provider, MD  hyoscyamine (LEVSIN SL) 0.125 MG SL tablet PLACE 1 TABLET UNDER THE TONGUE EVERY 4 HOURS AS NEEDED FOR PAIN. Patient taking differently: Take 0.125 mg by mouth every 4 (four) hours as needed. PLACE 1 TABLET UNDER THE TONGUE EVERY 4 HOURS AS NEEDED FOR PAIN. 08/04/14  Yes [name], MD  ibuprofen (ADVIL,MOTRIN) 200 MG tablet Take 400 mg by mouth every 6 (six) hours as needed. pain   Yes Historical Provider, MD  insulin aspart (NOVOLOG) 100 UNIT/ML injection Inject 6 Units into the skin 3 (three) times daily before meals. *Increase subcutaneous injection by 1 unit for every 50 units over 150.* Patient taking differently: Inject 8 Units into the skin 3 (three) times daily before meals. *Increase subcutaneous injection by 1 unit for every 50 units over 150.* 04/02/13  Yes [name], MD  Insulin Glargine (TOUJEO SOLOSTAR) 300 UNIT/ML SOPN Inject 30 Units into the skin every evening.   Yes Historical Provider, MD  Multiple Vitamins-Minerals (  MULTIVITAMINS THER. W/MINERALS) TABS Take 1 tablet by mouth daily. For energy   Yes Historical Provider, MD  omeprazole (PRILOSEC) 20 MG capsule Take 1 capsule (20 mg total) by mouth daily. Patient taking differently: Take 20 mg by mouth daily as needed (for acid reflux).  08/04/14  Yes [name], MD  oxyCODONE-acetaminophen (PERCOCET) 7.5-325 MG per tablet Take one tablet up to BID prn pain Patient taking differently: Take 1  tablet by mouth 2 (two) times daily as needed. Take one tablet up to BID prn pain 08/04/14  Yes [name], MD  Pancrelipase, Lip-Prot-Amyl, 6000 UNITS CPEP Take 2 capsules (12,000 Units total) by mouth 3 (three) times daily before meals. 08/04/14  Yes [name], MD  promethazine (PHENERGAN) 25 MG suppository Place 25 mg rectally every 6 (six) hours as needed for nausea or vomiting.   Yes Historical Provider, MD  promethazine (PHENERGAN) 25 MG tablet TAKE 1 TABLET BY MOUTH EVERY 6 HOURS AS NEEDED FOR NAUSEA. 08/21/14  Yes [name], MD  vitamin C (ASCORBIC ACID) 500 MG tablet Take 500 mg by mouth daily.   Yes Historical Provider, MD   BP 131/83 mmHg  Pulse 128  Temp(Src) 98.3 F (36.8 C) (Oral)  Resp 15  Ht 5\' 2"  (1.575 m)  Wt 113 lb 12.1 oz (51.6 kg)  BMI 20.80 kg/m2  SpO2 98%  LMP 09/24/2014 Physical Exam  Constitutional: She appears well-developed.  HENT:  Head: Atraumatic.  Lips are dry  Eyes: Pupils are equal, round, and reactive to light.  Neck: Neck supple.  Cardiovascular:  Tachycardia  Pulmonary/Chest: Effort normal and breath sounds normal.  Abdominal: Soft. There is tenderness.  Mild diffuse tenderness without rebound or guarding.  Neurological: She is alert.  Skin: Skin is warm.    ED Course  Procedures (including critical care time) Labs Review Labs Reviewed  COMPREHENSIVE METABOLIC PANEL - Abnormal; Notable for the following:    CO2 12 (*)    Glucose, Bld 267 (*)    BUN 30 (*)    Calcium 10.9 (*)    Total Protein 9.5 (*)    Alkaline Phosphatase 118 (*)    GFR calc non Af Amer 83 (*)    All other components within normal limits  CBC WITH DIFFERENTIAL/PLATELET - Abnormal; Notable for the following:    WBC 20.7 (*)    RBC 5.46 (*)    Hemoglobin 16.7 (*)    HCT 47.9 (*)    Platelets 428 (*)    Neutrophils Relative % 84 (*)    Neutro Abs 17.3 (*)    Lymphocytes Relative 8 (*)    Monocytes Absolute 1.7 (*)    All other components within  normal limits  URINALYSIS, ROUTINE W REFLEX MICROSCOPIC - Abnormal; Notable for the following:    APPearance HAZY (*)    Specific Gravity, Urine >1.030 (*)    Glucose, UA >1000 (*)    Hgb urine dipstick MODERATE (*)    Bilirubin Urine MODERATE (*)    Ketones, ur 40 (*)    Protein, ur 100 (*)    All other components within normal limits  URINE MICROSCOPIC-ADD ON - Abnormal; Notable for the following:    Squamous Epithelial / LPF FEW (*)    Bacteria, UA MANY (*)    Casts HYALINE CASTS (*)    All other components within normal limits  BASIC METABOLIC PANEL - Abnormal; Notable for the following:    Chloride 117 (*)  CO2 10 (*)    Glucose, Bld 205 (*)    BUN 26 (*)    All other components within normal limits  GLUCOSE, CAPILLARY - Abnormal; Notable for the following:    Glucose-Capillary 251 (*)    All other components within normal limits  GLUCOSE, CAPILLARY - Abnormal; Notable for the following:    Glucose-Capillary 265 (*)    All other components within normal limits  GLUCOSE, CAPILLARY - Abnormal; Notable for the following:    Glucose-Capillary 219 (*)    All other components within normal limits  CBG MONITORING, ED - Abnormal; Notable for the following:    Glucose-Capillary 251 (*)    All other components within normal limits  CBG MONITORING, ED - Abnormal; Notable for the following:    Glucose-Capillary 173 (*)    All other components within normal limits  MRSA PCR SCREENING  LIPASE, BLOOD  PREGNANCY, URINE  BASIC METABOLIC PANEL  BASIC METABOLIC PANEL  BASIC METABOLIC PANEL  BASIC METABOLIC PANEL    Imaging Review No results found.   EKG Interpretation   Date/Time:  Sunday October 15 2014 14:39:40 EDT Ventricular Rate:  121 PR Interval:  116 QRS Duration: 73 QT Interval:  339 QTC Calculation: 481 R Axis:   64 Text Interpretation:  Sinus tachycardia Borderline repolarization  abnormality Borderline prolonged QT interval U waves present Confirmed by   Alvino Chapel  MD, Ovid Curd [phone]) on 10/15/2014 2:51:53 PM      MDM   Final diagnoses:  Diabetic ketoacidosis without coma associated with type 1 diabetes mellitus    Patient resides with nausea vomiting. History of same. Found to be in DKA. Started on insulin drip. Admit to stepdown unit. Repeat fluid boluses given.  CRITICAL CARE Performed by: Mackie Pai Total critical care time: 30 Critical care time was exclusive of separately billable procedures and treating other patients. Critical care was necessary to treat or prevent imminent or life-threatening deterioration. Critical care was time spent personally by me on the following activities: development of treatment plan with patient and/or surrogate as well as nursing, discussions with consultants, evaluation of patient's response to treatment, examination of patient, obtaining history from patient or surrogate, ordering and performing treatments and interventions, ordering and review of laboratory studies, ordering and review of radiographic studies, pulse oximetry and re-evaluation of patient's condition.     Davonna Belling, MD 10/15/14 2139

## 2014-10-16 DIAGNOSIS — K859 Acute pancreatitis, unspecified: Secondary | ICD-10-CM

## 2014-10-16 DIAGNOSIS — E1010 Type 1 diabetes mellitus with ketoacidosis without coma: Principal | ICD-10-CM

## 2014-10-16 DIAGNOSIS — K219 Gastro-esophageal reflux disease without esophagitis: Secondary | ICD-10-CM

## 2014-10-16 LAB — BASIC METABOLIC PANEL
ANION GAP: 8 (ref 5–15)
ANION GAP: 9 (ref 5–15)
Anion gap: 8 (ref 5–15)
Anion gap: 12 (ref 5–15)
Anion gap: 10 (ref 5–15)
BUN: 21 mg/dL (ref 6–23)
BUN: 20 mg/dL (ref 6–23)
BUN: 18 mg/dL (ref 6–23)
BUN: 15 mg/dL (ref 6–23)
BUN: 12 mg/dL (ref 6–23)
CALCIUM: 9.7 mg/dL (ref 8.4–10.5)
CALCIUM: 8.9 mg/dL (ref 8.4–10.5)
CALCIUM: 9.2 mg/dL (ref 8.4–10.5)
CHLORIDE: 115 mmol/L — AB (ref 96–112)
CHLORIDE: 112 mmol/L (ref 96–112)
CO2: 17 mmol/L — AB (ref 19–32)
CO2: 16 mmol/L — ABNORMAL LOW (ref 19–32)
CO2: 18 mmol/L — AB (ref 19–32)
CO2: 20 mmol/L (ref 19–32)
CO2: 20 mmol/L (ref 19–32)
CREATININE: 0.68 mg/dL (ref 0.50–1.10)
Calcium: 9.5 mg/dL (ref 8.4–10.5)
Calcium: 8.8 mg/dL (ref 8.4–10.5)
Chloride: 114 mmol/L — ABNORMAL HIGH (ref 96–112)
Chloride: 112 mmol/L (ref 96–112)
Chloride: 112 mmol/L (ref 96–112)
Creatinine, Ser: 0.63 mg/dL (ref 0.50–1.10)
Creatinine, Ser: 0.67 mg/dL (ref 0.50–1.10)
Creatinine, Ser: 0.54 mg/dL (ref 0.50–1.10)
Creatinine, Ser: 0.59 mg/dL (ref 0.50–1.10)
GFR calc Af Amer: >90 mL/min (ref >90)
GFR calc Af Amer: >90 mL/min (ref >90)
GFR calc non Af Amer: >90 mL/min (ref >90)
GFR calc non Af Amer: >90 mL/min (ref >90)
GFR calc non Af Amer: >90 mL/min (ref >90)
GLUCOSE: 152 mg/dL — AB (ref 70–99)
Glucose, Bld: 188 mg/dL — ABNORMAL HIGH (ref 70–99)
Glucose, Bld: 284 mg/dL — ABNORMAL HIGH (ref 70–99)
Glucose, Bld: 119 mg/dL — ABNORMAL HIGH (ref 70–99)
Glucose, Bld: 241 mg/dL — ABNORMAL HIGH (ref 70–99)
POTASSIUM: 2.7 mmol/L — AB (ref 3.5–5.1)
Potassium: 3.4 mmol/L — ABNORMAL LOW (ref 3.5–5.1)
Potassium: 3.5 mmol/L (ref 3.5–5.1)
Potassium: 3.1 mmol/L — ABNORMAL LOW (ref 3.5–5.1)
Potassium: 2.9 mmol/L — ABNORMAL LOW (ref 3.5–5.1)
SODIUM: 141 mmol/L (ref 135–145)
Sodium: 140 mmol/L (ref 135–145)
Sodium: 142 mmol/L (ref 135–145)
Sodium: 138 mmol/L (ref 135–145)
Sodium: 142 mmol/L (ref 135–145)

## 2014-10-16 LAB — GLUCOSE, CAPILLARY
GLUCOSE-CAPILLARY: 187 mg/dL — AB (ref 70–99)
GLUCOSE-CAPILLARY: 160 mg/dL — AB (ref 70–99)
GLUCOSE-CAPILLARY: 47 mg/dL — AB (ref 70–99)
Glucose-Capillary: 120 mg/dL — ABNORMAL HIGH (ref 70–99)
Glucose-Capillary: 128 mg/dL — ABNORMAL HIGH (ref 70–99)
Glucose-Capillary: 129 mg/dL — ABNORMAL HIGH (ref 70–99)
Glucose-Capillary: 140 mg/dL — ABNORMAL HIGH (ref 70–99)
Glucose-Capillary: 141 mg/dL — ABNORMAL HIGH (ref 70–99)
Glucose-Capillary: 144 mg/dL — ABNORMAL HIGH (ref 70–99)
Glucose-Capillary: 144 mg/dL — ABNORMAL HIGH (ref 70–99)
Glucose-Capillary: 153 mg/dL — ABNORMAL HIGH (ref 70–99)
Glucose-Capillary: 157 mg/dL — ABNORMAL HIGH (ref 70–99)
Glucose-Capillary: 158 mg/dL — ABNORMAL HIGH (ref 70–99)
Glucose-Capillary: 158 mg/dL — ABNORMAL HIGH (ref 70–99)
Glucose-Capillary: 161 mg/dL — ABNORMAL HIGH (ref 70–99)
Glucose-Capillary: 176 mg/dL — ABNORMAL HIGH (ref 70–99)
Glucose-Capillary: 184 mg/dL — ABNORMAL HIGH (ref 70–99)
Glucose-Capillary: 199 mg/dL — ABNORMAL HIGH (ref 70–99)
Glucose-Capillary: 216 mg/dL — ABNORMAL HIGH (ref 70–99)
Glucose-Capillary: 277 mg/dL — ABNORMAL HIGH (ref 70–99)

## 2014-10-16 MED ORDER — POTASSIUM CHLORIDE 10 MEQ/100ML IV SOLN
10.0000 meq | INTRAVENOUS | Status: AC
  Administered 2014-10-16 (×2): 10 meq via INTRAVENOUS
  Filled 2014-10-16: qty 100

## 2014-10-16 MED ORDER — INSULIN REGULAR BOLUS VIA INFUSION
0.0000 [IU] | Freq: Three times a day (TID) | INTRAVENOUS | Status: DC
  Filled 2014-10-16: qty 10

## 2014-10-16 MED ORDER — INSULIN ASPART 100 UNIT/ML ~~LOC~~ SOLN
0.0000 [IU] | SUBCUTANEOUS | Status: DC
  Administered 2014-10-16: 3 [IU] via SUBCUTANEOUS

## 2014-10-16 MED ORDER — MORPHINE SULFATE 2 MG/ML IJ SOLN
2.0000 mg | INTRAMUSCULAR | Status: DC | PRN
  Administered 2014-10-16 – 2014-10-17 (×6): 2 mg via INTRAVENOUS
  Filled 2014-10-16 (×6): qty 1

## 2014-10-16 MED ORDER — POTASSIUM CHLORIDE 10 MEQ/100ML IV SOLN
10.0000 meq | INTRAVENOUS | Status: AC
  Administered 2014-10-16 (×2): 10 meq via INTRAVENOUS
  Filled 2014-10-16 (×2): qty 100

## 2014-10-16 MED ORDER — POTASSIUM CHLORIDE CRYS ER 20 MEQ PO TBCR
20.0000 meq | EXTENDED_RELEASE_TABLET | Freq: Every day | ORAL | Status: DC
  Administered 2014-10-16 – 2014-10-18 (×2): 20 meq via ORAL
  Filled 2014-10-16 (×3): qty 1

## 2014-10-16 MED ORDER — POTASSIUM CHLORIDE IN NACL 40-0.9 MEQ/L-% IV SOLN
INTRAVENOUS | Status: DC
  Administered 2014-10-16 – 2014-10-18 (×7): 150 mL/h via INTRAVENOUS

## 2014-10-16 MED ORDER — DEXTROSE 50 % IV SOLN
INTRAVENOUS | Status: AC
  Administered 2014-10-16: 25 g via INTRAVENOUS
  Filled 2014-10-16: qty 50

## 2014-10-16 MED ORDER — INSULIN ASPART 100 UNIT/ML ~~LOC~~ SOLN
0.0000 [IU] | Freq: Three times a day (TID) | SUBCUTANEOUS | Status: DC
  Administered 2014-10-16: 2 [IU] via SUBCUTANEOUS

## 2014-10-16 MED ORDER — SODIUM CHLORIDE 0.9 % IV BOLUS (SEPSIS)
1000.0000 mL | Freq: Once | INTRAVENOUS | Status: AC
  Administered 2014-10-16: 1000 mL via INTRAVENOUS

## 2014-10-16 MED ORDER — INSULIN GLARGINE 100 UNIT/ML ~~LOC~~ SOLN
30.0000 [IU] | SUBCUTANEOUS | Status: DC
  Administered 2014-10-16 – 2014-10-17 (×2): 30 [IU] via SUBCUTANEOUS
  Filled 2014-10-16 (×3): qty 0.3

## 2014-10-16 MED ORDER — POTASSIUM CHLORIDE IN NACL 40-0.9 MEQ/L-% IV SOLN
INTRAVENOUS | Status: DC
  Administered 2014-10-16: 150 mL/h via INTRAVENOUS

## 2014-10-16 MED ORDER — DEXTROSE 50 % IV SOLN
25.0000 g | Freq: Once | INTRAVENOUS | Status: AC
  Administered 2014-10-16: 25 g via INTRAVENOUS

## 2014-10-16 MED ORDER — INSULIN ASPART 100 UNIT/ML ~~LOC~~ SOLN: 0.0000 [IU] | Freq: Three times a day (TID) | SUBCUTANEOUS | Status: DC

## 2014-10-16 NOTE — Progress Notes (Signed)
Paged Fredirick Maudlin, NP for order for 0200 BMET; not currently one ordered; orders received

## 2014-10-16 NOTE — Progress Notes (Signed)
Hypoglycemic Event  CBG: 41 bedside recheck 47  Treatment: D50 IV 50 mL  Symptoms: Pale and Sweaty  Follow-up CBG: Time:2020 CBG Result:216  Possible Reasons for Event: Vomiting and Inadequate meal intake  Comments/MD notified: K Baltazar Najjar notified    Linda Walls  Remember to initiate Hypoglycemia Order Set & complete

## 2014-10-16 NOTE — Progress Notes (Signed)
Paged Linda Fantasia, NP about changing CBG to every four hours since patient is not taking in adequate PO intake, to clarify IVF orders, and to order labs to check K+ level; orders entered and acknowledged

## 2014-10-16 NOTE — Care Management Utilization Note (Signed)
UR completed 

## 2014-10-16 NOTE — Progress Notes (Signed)
[facility] PROGRESS NOTE  [name]:[id number] DOB: 10-03-1990    PCP:   [name], MD   HPI: Linda Walls is an 24 y.o. female admitted for DKA with mild AG of 15, nausea and vomiting.  She has hereditary chronic pancreatitis and takes occasional Percocet.  This am, she is doing better, with CBGs in the mid 100's.  Her bicarb rose from 10 to 18.    Rewiew of Systems:  Constitutional: Negative for malaise, fever and chills. No significant weight loss or weight gain Eyes: Negative for eye pain, redness and discharge, diplopia, visual changes, or flashes of light. ENMT: Negative for ear pain, hoarseness, nasal congestion, sinus pressure and sore throat. No headaches; tinnitus, drooling, or problem swallowing. Cardiovascular: Negative for chest pain, palpitations, diaphoresis, dyspnea and peripheral edema. ; No orthopnea, PND Respiratory: Negative for cough, hemoptysis, wheezing and stridor. No pleuritic chestpain. Gastrointestinal: Negative for diarrhea, constipation, abdominal pain, melena, blood in stool, hematemesis, jaundice and rectal bleeding.    Genitourinary: Negative for frequency, dysuria, incontinence,flank pain and hematuria; Musculoskeletal: Negative for back pain and neck pain. Negative for swelling and trauma.;  Skin: . Negative for pruritus, rash, abrasions, bruising and skin lesion.; ulcerations Neuro: Negative for headache, lightheadedness and neck stiffness. Negative for weakness, altered level of consciousness , altered mental status, extremity weakness, burning feet, involuntary movement, seizure and syncope.  Psych: negative for anxiety, depression, insomnia, tearfulness, panic attacks, hallucinations, paranoia, suicidal or homicidal ideation    Past Medical History  Diagnosis Date  . Hereditary pancreatitis   . DM type 1 (diabetes mellitus, type 1)   . Hernia   . GERD (gastroesophageal reflux disease)   . Jaundice     Past Surgical  History  Procedure Laterality Date  . Hernia repair      As infant.  . Bile duct stent placement  2006 at [facility]  . Pancreatic cyst drainage  at age 67 year    Medications:  HOME MEDS: Prior to Admission medications   Medication Sig Start Date End Date Taking? Authorizing Provider  ferrous sulfate 325 (65 FE) MG tablet Take 325 mg by mouth daily with breakfast.   Yes Historical Provider, MD  hyoscyamine (LEVSIN SL) 0.125 MG SL tablet PLACE 1 TABLET UNDER THE TONGUE EVERY 4 HOURS AS NEEDED FOR PAIN. Patient taking differently: Take 0.125 mg by mouth every 4 (four) hours as needed. PLACE 1 TABLET UNDER THE TONGUE EVERY 4 HOURS AS NEEDED FOR PAIN. 08/04/14  Yes [name], MD  ibuprofen (ADVIL,MOTRIN) 200 MG tablet Take 400 mg by mouth every 6 (six) hours as needed. pain   Yes Historical Provider, MD  insulin aspart (NOVOLOG) 100 UNIT/ML injection Inject 6 Units into the skin 3 (three) times daily before meals. *Increase subcutaneous injection by 1 unit for every 50 units over 150.* Patient taking differently: Inject 8 Units into the skin 3 (three) times daily before meals. *Increase subcutaneous injection by 1 unit for every 50 units over 150.* 04/02/13  Yes [name], MD  Insulin Glargine (TOUJEO SOLOSTAR) 300 UNIT/ML SOPN Inject 30 Units into the skin every evening.   Yes Historical Provider, MD  Multiple Vitamins-Minerals (MULTIVITAMINS THER. W/MINERALS) TABS Take 1 tablet by mouth daily. For energy   Yes Historical Provider, MD  omeprazole (PRILOSEC) 20 MG capsule Take 1 capsule (20 mg total) by mouth daily. Patient taking differently: Take 20 mg by mouth daily as needed (for acid reflux).  08/04/14  Yes [name]  [name], MD  oxyCODONE-acetaminophen (PERCOCET) 7.5-325 MG per tablet Take one tablet up to BID prn pain Patient taking differently: Take 1 tablet by mouth 2 (two) times daily as needed. Take one tablet up to BID prn pain 08/04/14  Yes [name], MD  Pancrelipase,  Lip-Prot-Amyl, 6000 UNITS CPEP Take 2 capsules (12,000 Units total) by mouth 3 (three) times daily before meals. 08/04/14  Yes [name], MD  promethazine (PHENERGAN) 25 MG suppository Place 25 mg rectally every 6 (six) hours as needed for nausea or vomiting.   Yes Historical Provider, MD  promethazine (PHENERGAN) 25 MG tablet TAKE 1 TABLET BY MOUTH EVERY 6 HOURS AS NEEDED FOR NAUSEA. 08/21/14  Yes [name], MD  vitamin C (ASCORBIC ACID) 500 MG tablet Take 500 mg by mouth daily.   Yes Historical Provider, MD     Allergies:  Allergies  Allergen Reactions  . Cefzil [Cefprozil]     Sickness  . Shellfish Allergy Nausea And Vomiting  . Sulfa Antibiotics Rash    Social History:   reports that she has never smoked. She has never used smokeless tobacco. She reports that she does not drink alcohol or use illicit drugs.  Family History: Family History  Problem Relation Age of Onset  . Pancreatitis Father 30    hereditary  . Hypertension Father   . GER disease Father   . Breast cancer Maternal Aunt   . Congenital heart disease Maternal Grandfather   . Prostate cancer Maternal Grandfather      Physical Exam: Filed Vitals:   10/16/14 0400 10/16/14 0500 10/16/14 0600 10/16/14 0800  BP: 110/74  122/86 121/78  Pulse: 103 98 117 100  Temp: 98.6 F (37 C)     TempSrc: Oral     Resp: 14 14 12 15   Height:      Weight:  52 kg (114 lb 10.2 oz)    SpO2: 97% 98% 99% 100%   Blood pressure 121/78, pulse 100, temperature 98.6 F (37 C), temperature source Oral, resp. rate 15, height 5\' 2"  (1.575 m), weight 52 kg (114 lb 10.2 oz), last menstrual period 09/24/2014, SpO2 100 %.  GEN:  Pleasant  patient lying in the stretcher in no acute distress; cooperative with exam. PSYCH:  alert and oriented x4; does not appear anxious or depressed; affect is appropriate. HEENT: Mucous membranes pink and anicteric; PERRLA; EOM intact; no cervical lymphadenopathy nor thyromegaly or carotid bruit;  no JVD; There were no stridor. Neck is very supple. Breasts:: Not examined CHEST WALL: No tenderness CHEST: Normal respiration, clear to auscultation bilaterally.  HEART: Regular rate and rhythm.  There are no murmur, rub, or gallops.   BACK: No kyphosis or scoliosis; no CVA tenderness ABDOMEN: soft and non-tender; no masses, no organomegaly, normal abdominal bowel sounds; no pannus; no intertriginous candida. There is no rebound and no distention. Rectal Exam: Not done EXTREMITIES: No bone or joint deformity; age-appropriate arthropathy of the hands and knees; no edema; no ulcerations.  There is no calf tenderness. Genitalia: not examined PULSES: 2+ and symmetric SKIN: Normal hydration no rash or ulceration CNS: Cranial nerves 2-12 grossly intact no focal lateralizing neurologic deficit.  Speech is fluent; uvula elevated with phonation, facial symmetry and tongue midline. DTR are normal bilaterally, cerebella exam is intact, barbinski is negative and strengths are equaled bilaterally.  No sensory loss.   Labs on Admission:  Basic Metabolic Panel:  Recent Labs Lab 10/15/14 1744 10/15/14 2142 10/16/14 0211 10/16/14 0523 10/16/14  0913  NA 140 141 140 142 138  K 4.8 3.6 3.4* 3.5 3.1*  CL 117* 118* 115* 114* 112  CO2 10* 17* 17* 16* 18*  GLUCOSE 205* 127* 188* 152* 284*  BUN 26* 23 21 20 18   CREATININE 0.76 0.66 0.68 0.63 0.67  CALCIUM 9.5 9.6 9.5 9.7 8.9   Liver Function Tests:  Recent Labs Lab 10/15/14 1450  AST 17  ALT 18  ALKPHOS 118*  BILITOT 0.8  PROT 9.5*  ALBUMIN 5.2    Recent Labs Lab 10/15/14 1450  LIPASE 16   No results for input(s): AMMONIA in the last 168 hours. CBC:  Recent Labs Lab 10/15/14 1450  WBC 20.7*  NEUTROABS 17.3*  HGB 16.7*  HCT 47.9*  MCV 87.7  PLT 428*   Cardiac Enzymes: No results for input(s): CKTOTAL, CKMB, CKMBINDEX, TROPONINI in the last 168 hours.  CBG:  Recent Labs Lab 10/16/14 0559 10/16/14 0648 10/16/14 0754  10/16/14 0848 10/16/14 0950  GLUCAP 158* 176* 144* 277* 160*      Assessment/Plan Present on Admission:  . DKA (diabetic ketoacidoses) . Nausea and vomiting . Hereditary pancreatitis . GERD (gastroesophageal reflux disease) . DM type 1 (diabetes mellitus, type 1)  PLAN:  Will continue with IV insulin drip to close AG, and change to Lantus with SSI CBGs are in range.  WIll give KCl supplements.  Will give IVF bolus, and PRN morphine for abdominal pain.    Other plans as per orders.  Code Status: FULL Haskel Khan, MD. [facility] Pager [phone] 7pm to 7am.  10/16/2014, 10:51 AM

## 2014-10-16 NOTE — Progress Notes (Signed)
CRITICAL VALUE ALERT  Critical value received:  K+  Date of notification:  10/16/2014  Time of notification:  2050  Critical value read back:Yes.    Nurse who received alert:  Wonda Cheng, RN  MD notified (1st page):  Tylene Fantasia, NP  Time of first page:  2055  MD notified (2nd page):  Time of second page:  Responding MD:  Tylene Fantasia, NP  Time MD responded:  2102  Orders received on the chart for Am labs and K+ replacements

## 2014-10-16 NOTE — Progress Notes (Addendum)
Inpatient Diabetes Program Recommendations  AACE/ADA: New Consensus Statement on Inpatient Glycemic Control (2013)  Target Ranges:  Prepandial:   less than 140 mg/dL      Peak postprandial:   less than 180 mg/dL (1-2 hours)      Critically ill patients:  140 - 180 mg/dL   Results for Linda Walls, BARK (MRN [id number]) as of 10/16/2014 08:52  Ref. Range 10/16/2014 00:45 10/16/2014 01:50 10/16/2014 02:52 10/16/2014 03:56 10/16/2014 04:57 10/16/2014 05:59 10/16/2014 06:48 10/16/2014 07:54  Glucose-Capillary Latest Ref Range: 70-99 mg/dL 199 (H) 187 (H) 158 (H) 140 (H) 120 (H) 158 (H) 176 (H) 144 (H)    Diabetes history: DM1 Outpatient Diabetes medications: Toujeo 30 units QPM, Novolog 8 units TID with meals plus 1 unit for every 50 mg/dl above target of 150 mg/dl Current orders for Inpatient glycemic control: Novolin R insulin drip per DKA protocol  Inpatient Diabetes Program Recommendations Insulin - IV drip/GlucoStabilizer: According to labs at 5:23 am patient remains acidotic at this time and will need to remain on insulin drip until acidosis resolves. Insulin - Basal: Once patient meets criteria to transition off IV insulin per DKA protocol, recommend ordering Lantus 30 units Q24H. Correction (SSI): Once patient meets criteria to transition off IV insulin per DKA protocol, recommend ordering Novolog sensitive correction scale Q4H. Insulin - Meal Coverage: Once patient meets criteria to transition off IV insulin per DKA protocol, recommend ordering Novolog 0-6 units (1 unit for every 10 grams of carbs) TID with meals for meal coverage (if patient eats at least 50% of meal). Thanks, Barnie Alderman, RN, MSN, CCRN, CDE Diabetes Coordinator Inpatient Diabetes Program [phone] (Team Pager from [place] to [place]) [phone] ([place] office) [phone] [facility] office)

## 2014-10-16 NOTE — Progress Notes (Signed)
Paged Fredirick Maudlin, NP about insulin drip being less than 1 unit per hour per the DKA protocol; called back at 2335 orders received to keep insulin drip infusing till 0200 BMET, closed anion gap, and CO2 at least 19; will continue to monitor and document any changes

## 2014-10-17 DIAGNOSIS — R112 Nausea with vomiting, unspecified: Secondary | ICD-10-CM

## 2014-10-17 LAB — GLUCOSE, CAPILLARY
GLUCOSE-CAPILLARY: 256 mg/dL — AB (ref 70–99)
Glucose-Capillary: 41 mg/dL — CL (ref 70–99)
Glucose-Capillary: 197 mg/dL — ABNORMAL HIGH (ref 70–99)
Glucose-Capillary: 159 mg/dL — ABNORMAL HIGH (ref 70–99)
Glucose-Capillary: 129 mg/dL — ABNORMAL HIGH (ref 70–99)
Glucose-Capillary: 153 mg/dL — ABNORMAL HIGH (ref 70–99)

## 2014-10-17 LAB — BASIC METABOLIC PANEL
ANION GAP: 5 (ref 5–15)
BUN: 8 mg/dL (ref 6–23)
CHLORIDE: 111 mmol/L (ref 96–112)
CO2: 23 mmol/L (ref 19–32)
Calcium: 8.5 mg/dL (ref 8.4–10.5)
Creatinine, Ser: 0.47 mg/dL — ABNORMAL LOW (ref 0.50–1.10)
GFR calc non Af Amer: >90 mL/min (ref >90)
Glucose, Bld: 213 mg/dL — ABNORMAL HIGH (ref 70–99)
Potassium: 3.2 mmol/L — ABNORMAL LOW (ref 3.5–5.1)
SODIUM: 139 mmol/L (ref 135–145)

## 2014-10-17 MED ORDER — HYDROMORPHONE HCL 1 MG/ML IJ SOLN
0.5000 mg | INTRAMUSCULAR | Status: DC | PRN
  Administered 2014-10-17 – 2014-10-18 (×6): 0.5 mg via INTRAVENOUS
  Filled 2014-10-17 (×6): qty 1

## 2014-10-17 MED ORDER — METOCLOPRAMIDE HCL 5 MG/ML IJ SOLN
5.0000 mg | Freq: Once | INTRAMUSCULAR | Status: AC
  Administered 2014-10-17: 5 mg via INTRAVENOUS
  Filled 2014-10-17: qty 2

## 2014-10-17 MED ORDER — INSULIN ASPART 100 UNIT/ML ~~LOC~~ SOLN
0.0000 [IU] | SUBCUTANEOUS | Status: DC
  Administered 2014-10-17: 1 [IU] via SUBCUTANEOUS
  Administered 2014-10-17 (×2): 2 [IU] via SUBCUTANEOUS
  Administered 2014-10-17: 5 [IU] via SUBCUTANEOUS
  Administered 2014-10-17: 3 [IU] via SUBCUTANEOUS
  Administered 2014-10-18: 2 [IU] via SUBCUTANEOUS

## 2014-10-17 NOTE — Progress Notes (Signed)
[facility] PROGRESS NOTE  Linda Walls OVF:[id number] DOB: Jun 22, 1991    PCP:   [name], MD   HPI: Linda Walls is an 24 y.o. female admitted for DKA, nausea and vomiting.  Her DKA resolved, and her bicarb this am was 23 with AG of 5.  Her K is being supplemented.  She still can't eat and had nausea and vomiting.  She did have one episode of hypoglycemia, dropping her BS to 47.   Rewiew of Systems:  Constitutional: Negative for malaise, fever and chills. No significant weight loss or weight gain Eyes: Negative for eye pain, redness and discharge, diplopia, visual changes, or flashes of light. ENMT: Negative for ear pain, hoarseness, nasal congestion, sinus pressure and sore throat. No headaches; tinnitus, drooling, or problem swallowing. Cardiovascular: Negative for chest pain, palpitations, diaphoresis, dyspnea and peripheral edema. ; No orthopnea, PND Respiratory: Negative for cough, hemoptysis, wheezing and stridor. No pleuritic chestpain. Gastrointestinal: Negative for  diarrhea, constipation, abdominal pain, melena, blood in stool, hematemesis, jaundice and rectal bleeding.    Genitourinary: Negative for frequency, dysuria, incontinence,flank pain and hematuria; Musculoskeletal: Negative for back pain and neck pain. Negative for swelling and trauma.;  Skin: . Negative for pruritus, rash, abrasions, bruising and skin lesion.; ulcerations Neuro: Negative for headache, lightheadedness and neck stiffness. Negative for weakness, altered level of consciousness , altered mental status, extremity weakness, burning feet, involuntary movement, seizure and syncope.  Psych: negative for anxiety, depression, insomnia, tearfulness, panic attacks, hallucinations, paranoia, suicidal or homicidal ideation    Past Medical History  Diagnosis Date  . Hereditary pancreatitis   . DM type 1 (diabetes mellitus, type 1)   . Hernia   . GERD (gastroesophageal reflux disease)   . Jaundice      Past Surgical History  Procedure Laterality Date  . Hernia repair      As infant.  . Bile duct stent placement  2006 at [facility]  . Pancreatic cyst drainage  at age 28 year    Medications:  HOME MEDS: Prior to Admission medications   Medication Sig Start Date End Date Taking? Authorizing Provider  ferrous sulfate 325 (65 FE) MG tablet Take 325 mg by mouth daily with breakfast.   Yes Historical Provider, MD  hyoscyamine (LEVSIN SL) 0.125 MG SL tablet PLACE 1 TABLET UNDER THE TONGUE EVERY 4 HOURS AS NEEDED FOR PAIN. Patient taking differently: Take 0.125 mg by mouth every 4 (four) hours as needed. PLACE 1 TABLET UNDER THE TONGUE EVERY 4 HOURS AS NEEDED FOR PAIN. 08/04/14  Yes [name], MD  ibuprofen (ADVIL,MOTRIN) 200 MG tablet Take 400 mg by mouth every 6 (six) hours as needed. pain   Yes Historical Provider, MD  insulin aspart (NOVOLOG) 100 UNIT/ML injection Inject 6 Units into the skin 3 (three) times daily before meals. *Increase subcutaneous injection by 1 unit for every 50 units over 150.* Patient taking differently: Inject 8 Units into the skin 3 (three) times daily before meals. *Increase subcutaneous injection by 1 unit for every 50 units over 150.* 04/02/13  Yes [name], MD  Insulin Glargine (TOUJEO SOLOSTAR) 300 UNIT/ML SOPN Inject 30 Units into the skin every evening.   Yes Historical Provider, MD  Multiple Vitamins-Minerals (MULTIVITAMINS THER. W/MINERALS) TABS Take 1 tablet by mouth daily. For energy   Yes Historical Provider, MD  omeprazole (PRILOSEC) 20 MG capsule Take 1 capsule (20 mg total) by mouth daily. Patient taking differently: Take 20 mg by mouth daily as needed (for  acid reflux).  08/04/14  Yes [name], MD  oxyCODONE-acetaminophen (PERCOCET) 7.5-325 MG per tablet Take one tablet up to BID prn pain Patient taking differently: Take 1 tablet by mouth 2 (two) times daily as needed. Take one tablet up to BID prn pain 08/04/14  Yes [name],  MD  Pancrelipase, Lip-Prot-Amyl, 6000 UNITS CPEP Take 2 capsules (12,000 Units total) by mouth 3 (three) times daily before meals. 08/04/14  Yes [name], MD  promethazine (PHENERGAN) 25 MG suppository Place 25 mg rectally every 6 (six) hours as needed for nausea or vomiting.   Yes Historical Provider, MD  promethazine (PHENERGAN) 25 MG tablet TAKE 1 TABLET BY MOUTH EVERY 6 HOURS AS NEEDED FOR NAUSEA. 08/21/14  Yes [name], MD  vitamin C (ASCORBIC ACID) 500 MG tablet Take 500 mg by mouth daily.   Yes Historical Provider, MD     Allergies:  Allergies  Allergen Reactions  . Cefzil [Cefprozil]     Sickness  . Shellfish Allergy Nausea And Vomiting  . Sulfa Antibiotics Rash    Social History:   reports that she has never smoked. She has never used smokeless tobacco. She reports that she does not drink alcohol or use illicit drugs.  Family History: Family History  Problem Relation Age of Onset  . Pancreatitis Father 30    hereditary  . Hypertension Father   . GER disease Father   . Breast cancer Maternal Aunt   . Congenital heart disease Maternal Grandfather   . Prostate cancer Maternal Grandfather      Physical Exam: Filed Vitals:   10/17/14 0400 10/17/14 0500 10/17/14 0600 10/17/14 0700  BP: 131/83  123/83   Pulse: 72 86 90 94  Temp: 99.3 F (37.4 C)     TempSrc: Oral     Resp: 12 22 21 20   Height:  5\' 2"  (1.575 m)    Weight:  52.7 kg (116 lb 2.9 oz)    SpO2: 98% 97% 97% 96%   Blood pressure 123/83, pulse 94, temperature 99.3 F (37.4 C), temperature source Oral, resp. rate 20, height 5\' 2"  (1.575 m), weight 52.7 kg (116 lb 2.9 oz), last menstrual period 09/24/2014, SpO2 96 %.  GEN:  Pleasant patient lying in the stretcher in no acute distress; cooperative with exam. PSYCH:  alert and oriented x4; does not appear anxious or depressed; affect is appropriate. HEENT: Mucous membranes pink and anicteric; PERRLA; EOM intact; no cervical lymphadenopathy nor  thyromegaly or carotid bruit; no JVD; There were no stridor. Neck is very supple. Breasts:: Not examined CHEST WALL: No tenderness CHEST: Normal respiration, clear to auscultation bilaterally.  HEART: Regular rate and rhythm.  There are no murmur, rub, or gallops.   BACK: No kyphosis or scoliosis; no CVA tenderness ABDOMEN: soft and non-tender; no masses, no organomegaly, normal abdominal bowel sounds; no pannus; no intertriginous candida. There is no rebound and no distention. Rectal Exam: Not done EXTREMITIES: No bone or joint deformity; age-appropriate arthropathy of the hands and knees; no edema; no ulcerations.  There is no calf tenderness. Genitalia: not examined PULSES: 2+ and symmetric SKIN: Normal hydration no rash or ulceration CNS: Cranial nerves 2-12 grossly intact no focal lateralizing neurologic deficit.  Speech is fluent; uvula elevated with phonation, facial symmetry and tongue midline. DTR are normal bilaterally, cerebella exam is intact, barbinski is negative and strengths are equaled bilaterally.  No sensory loss.   Labs on Admission:  Basic Metabolic Panel:  Recent Labs  Lab 10/16/14 0523 10/16/14 0913 10/16/14 1320 10/16/14 2014 10/17/14 0430  NA 142 138 141 142 139  K 3.5 3.1* 2.9* 2.7* 3.2*  CL 114* 112 112 112 111  CO2 16* 18* 20 20 23   GLUCOSE 152* 284* 119* 241* 213*  BUN 20 18 15 12 8   CREATININE 0.63 0.67 0.54 0.59 0.47*  CALCIUM 9.7 8.9 9.2 8.8 8.5   Liver Function Tests:  Recent Labs Lab 10/15/14 1450  AST 17  ALT 18  ALKPHOS 118*  BILITOT 0.8  PROT 9.5*  ALBUMIN 5.2    Recent Labs Lab 10/15/14 1450  LIPASE 16   No results for input(s): AMMONIA in the last 168 hours. CBC:  Recent Labs Lab 10/15/14 1450  WBC 20.7*  NEUTROABS 17.3*  HGB 16.7*  HCT 47.9*  MCV 87.7  PLT 428*   Cardiac Enzymes: No results for input(s): CKTOTAL, CKMB, CKMBINDEX, TROPONINI in the last 168 hours.  CBG:  Recent Labs Lab 10/16/14 1956  10/16/14 1957 10/16/14 2018 10/16/14 2351 10/17/14 0337  GLUCAP 41* 47* 216* 144* 197*    Assessment/Plan Present on Admission:  . DKA (diabetic ketoacidoses) . Nausea and vomiting . Hereditary pancreatitis . GERD (gastroesophageal reflux disease) . DM type 1 (diabetes mellitus, type 1)  PLAN:  Her DKA resolved.  BS fluctuates.  Will transfer to floor today.  Continue with current regimen and continue with IVF.  Will continue with K supplements.  She is improving slowly and if she can eat, will plan to d/c her tomorrow.   Other plans as per orders.  Code Status: FULL Haskel Khan, MD. [facility] Pager [phone] 7pm to 7am.  10/17/2014, 7:32 AM

## 2014-10-17 NOTE — Progress Notes (Addendum)
Inpatient Diabetes Program Recommendations  AACE/ADA: New Consensus Statement on Inpatient Glycemic Control (2013)  Target Ranges:  Prepandial:   less than 140 mg/dL      Peak postprandial:   less than 180 mg/dL (1-2 hours)      Critically ill patients:  140 - 180 mg/dL   Results for Linda Walls, Linda Walls (MRN [id number]) as of 10/17/2014 07:21  Ref. Range 10/16/2014 15:53 10/16/2014 16:49 10/16/2014 19:56 10/16/2014 19:57 10/16/2014 20:18 10/16/2014 23:51 10/17/2014 03:37  Glucose-Capillary Latest Ref Range: 70-99 mg/dL 161 (H) 157 (H) 41 (LL) 47 (L) 216 (H) 144 (H) 197 (H)   Diabetes history: DM1 Outpatient Diabetes medications: Toujeo 30 units QPM, Novolog 8 units TID with meals plus 1 unit for every 50 mg/dl above target of 150 mg/dl Current orders for Inpatient glycemic control: Lantus 30 units Q24H, Novolog 0-9 units Q4H, Novolog 0-6 units TID with meals (1 unit for ever 10 grams of carbs)   Inpatient Diabetes Program Recommendations  Correction (SSI): Patient uses a 1 to 50 mg/dl sensitivity factor as an outpatient. May want to consider changing Novolog correction to a custom scale Novolog 0-5 units Q4H (150-200 give 1 unit, 201-250 give 2 units, 251-300 give 3 units, 301-350 give 4 units, 351-400 give 5 units, >401 call MD).  Insulin - Meal Coverage: Noted patient received Novolog 5 units at 16:59 (2 units for correction and 3 units for carb coverage). Then following CBG was 41 mg/dl at 19:56. Anticipate low due to Novolog especially since patient only ate 25% of supper and still having episodes of vomiting. NURSING: Please be sure patient eats first before receiving carb coverage since patient still has poor PO intake.   Thanks, Barnie Alderman, RN, MSN, CCRN, CDE Diabetes Coordinator Inpatient Diabetes Program [phone] (Team Pager from [place] to [place]) [phone] ([place] office) [phone] [facility] office)

## 2014-10-17 NOTE — Progress Notes (Signed)
Pt transferred to 306. Report given to Sharyn Blitz RN. Vital signs remained stable.

## 2014-10-17 NOTE — Care Management Note (Addendum)
    Page 1 of 1   10/18/2014     9:57:11 AM CARE MANAGEMENT NOTE 10/18/2014  Patient:  Linda Walls, Linda Walls   Account Number:  [id number]  Date Initiated:  10/17/2014  Documentation initiated by:  Jolene Provost  Subjective/Objective Assessment:   Pt is from home, lives with parents. Pt admitted with DKA. Pt is independent at baseline. Pt has no HH services or DME's prior to admission. No CM needs.     Action/Plan:   Anticipated DC Date:  10/19/2014   Anticipated DC Plan:  [place]  CM consult      Choice offered to / List presented to:             Status of service:  Completed, signed off Medicare Important Message given?   (If response is "NO", the following Medicare IM given date fields will be blank) Date Medicare IM given:   Medicare IM given by:   Date Additional Medicare IM given:   Additional Medicare IM given by:    Discharge Disposition:  HOME/SELF CARE  Per UR Regulation:  Reviewed for med. necessity/level of care/duration of stay  If discussed at [place] of Stay Meetings, dates discussed:    Comments:  10/18/2014 [place], RN, MSN, CM Pt discharging home today. No CM needs. 10/17/2014 [place], RN, MSN, CM

## 2014-10-18 LAB — GLUCOSE, CAPILLARY
GLUCOSE-CAPILLARY: 64 mg/dL — AB (ref 70–99)
GLUCOSE-CAPILLARY: 91 mg/dL (ref 70–99)
Glucose-Capillary: 133 mg/dL — ABNORMAL HIGH (ref 70–99)
Glucose-Capillary: 113 mg/dL — ABNORMAL HIGH (ref 70–99)
Glucose-Capillary: 178 mg/dL — ABNORMAL HIGH (ref 70–99)

## 2014-10-18 LAB — BASIC METABOLIC PANEL
Anion gap: 5 (ref 5–15)
BUN: 6 mg/dL (ref 6–23)
CALCIUM: 8.3 mg/dL — AB (ref 8.4–10.5)
CO2: 26 mmol/L (ref 19–32)
CREATININE: 0.44 mg/dL — AB (ref 0.50–1.10)
Chloride: 109 mmol/L (ref 96–112)
GLUCOSE: 121 mg/dL — AB (ref 70–99)
Potassium: 3.6 mmol/L (ref 3.5–5.1)
Sodium: 140 mmol/L (ref 135–145)

## 2014-10-18 NOTE — Discharge Summary (Signed)
Physician Discharge Summary  Linda Walls DQQ:[id number] DOB: Mar 27, 1991 DOA: 10/15/2014  PCP: [name], MD  Admit date: 10/15/2014 Discharge date: 10/18/2014  Time spent: 45 minutes  Recommendations for Outpatient Follow-up:  Patient will be discharged to home.  Patient will need to follow up with primary care provider within one week of discharge.  Patient should continue medications as prescribed.  Patient should follow a carb modified diet.   Discharge Diagnoses:  Principal Problem:   DKA (diabetic ketoacidoses) Active Problems:   Hereditary pancreatitis   DM type 1 (diabetes mellitus, type 1)   GERD (gastroesophageal reflux disease)   Nausea and vomiting   Discharge Condition: Stable  Diet recommendation: Carb modified  Filed Weights   10/15/14 1729 10/16/14 0500 10/17/14 0500  Weight: 51.6 kg (113 lb 12.1 oz) 52 kg (114 lb 10.2 oz) 52.7 kg (116 lb 2.9 oz)    History of present illness:  On 10/15/2014 by Dr. Eleonore Chiquito 24 year old female who  has a past medical history of Hereditary pancreatitis; DM type 1 (diabetes mellitus, type 1); Hernia; GERD (gastroesophageal reflux disease); and Jaundice. today presents to the hospital with chief computer for abdominal pain, nausea and vomiting for past 2 days. At home patient's blood sugar was elevated to 500s and as per patient's parents were unable to bring it less than 300 with insulin. Patient complains of loose stools, nausea and vomiting. She also complains of abdominal pain. She has a history of hereditary pancreatitis, lipase today is 16. She denies chest pain, no shortness of breath. No fever, no dysuria urgency frequency of urination. Patient blood sugar initially was found to be in 400s with bicarbonate of 12. AG of 15.  Hospital Course:  Diabetic ketoacidosis/diabetes mellitus, type I -Resolved -Continue home regimen upon discharge -Unknown etiology, patient does have history of hereditary  pancreatitis -Infectious workup including urinalysis negative, patient has no complaints of shortness of breath or cough Abdominal pain with nausea and vomiting -Possibly related to hereditary pancreatitis versus viral etiology  -Lipase was 16 -Continue anti-emetics -Patient reports HbA1c was 8 as an outpatient  GERD -Continue PPI  Leukocytosis -Likely reactive -UA negative for infection, negative nitrites and leukocytes (no complaints of urinary symptoms) -Patient denies any shortness of breath or cough  Hypokalemia -Likely secondary to DKA, potassium replaced  Procedures: None  Consultations: None  Discharge Exam: Filed Vitals:   10/18/14 0357  BP: 118/76  Pulse: 85  Temp: 98.2 F (36.8 C)  Resp: 18     General: Well developed, well nourished, NAD, appears stated age  HEENT: NCAT, mucous membranes moist.  Cardiovascular: S1 S2 auscultated, no rubs, murmurs or gallops. Regular rate and rhythm.  Respiratory: Clear to auscultation bilaterally with equal chest rise  Abdomen: Soft, nontender, nondistended, + bowel sounds  Extremities: warm dry without cyanosis clubbing or edema  Neuro: AAOx3, nonfocal  Psych: Normal affect and demeanor with intact judgement and insight  Discharge Instructions      Discharge Instructions    Discharge instructions    Complete by:  As directed   Patient will be discharged to home.  Patient will need to follow up with primary care provider within one week of discharge.  Patient should continue medications as prescribed.  Patient should follow a carb modified diet.            Medication List    TAKE these medications        ferrous sulfate 325 (65 FE) MG tablet  Take 325 mg  by mouth daily with breakfast.     hyoscyamine 0.125 MG SL tablet  Commonly known as:  LEVSIN SL  PLACE 1 TABLET UNDER THE TONGUE EVERY 4 HOURS AS NEEDED FOR PAIN.     ibuprofen 200 MG tablet  Commonly known as:  ADVIL,MOTRIN  Take 400 mg by  mouth every 6 (six) hours as needed. pain     insulin aspart 100 UNIT/ML injection  Commonly known as:  novoLOG  Inject 6 Units into the skin 3 (three) times daily before meals. *Increase subcutaneous injection by 1 unit for every 50 units over 150.*     multivitamins ther. w/minerals Tabs tablet  Take 1 tablet by mouth daily. For energy     omeprazole 20 MG capsule  Commonly known as:  PRILOSEC  Take 1 capsule (20 mg total) by mouth daily.     oxyCODONE-acetaminophen 7.5-325 MG per tablet  Commonly known as:  PERCOCET  Take one tablet up to BID prn pain     Pancrelipase (Lip-Prot-Amyl) 6000 UNITS Cpep  Take 2 capsules (12,000 Units total) by mouth 3 (three) times daily before meals.     promethazine 25 MG suppository  Commonly known as:  PHENERGAN  Place 25 mg rectally every 6 (six) hours as needed for nausea or vomiting.     promethazine 25 MG tablet  Commonly known as:  PHENERGAN  TAKE 1 TABLET BY MOUTH EVERY 6 HOURS AS NEEDED FOR NAUSEA.     TOUJEO SOLOSTAR 300 UNIT/ML Sopn  Generic drug:  Insulin Glargine  Inject 30 Units into the skin every evening.     vitamin C 500 MG tablet  Commonly known as:  ASCORBIC ACID  Take 500 mg by mouth daily.       Allergies  Allergen Reactions  . Cefzil [Cefprozil]     Sickness  . Shellfish Allergy Nausea And Vomiting  . Sulfa Antibiotics Rash   Follow-up Information    Follow up with [name], MD. Schedule an appointment as soon as possible for a visit in 1 week.   Specialty:  Family Medicine   Why:  Hospital follow up   Contact information:   [address] [place] [address] [phone]        The results of significant diagnostics from this hospitalization (including imaging, microbiology, ancillary and laboratory) are listed below for reference.    Significant Diagnostic Studies: No results found.  Microbiology: Recent Results (from the past 240 hour(s))  MRSA PCR Screening     Status: None    Collection Time: 10/15/14  5:30 PM  Result Value Ref Range Status   MRSA by PCR NEGATIVE NEGATIVE Final    Comment:        The GeneXpert MRSA Assay (FDA approved for NASAL specimens only), is one component of a comprehensive MRSA colonization surveillance program. It is not intended to diagnose MRSA infection nor to guide or monitor treatment for MRSA infections.      Labs: Basic Metabolic Panel:  Recent Labs Lab 10/16/14 0913 10/16/14 1320 10/16/14 2014 10/17/14 0430 10/18/14 0528  NA 138 141 142 139 140  K 3.1* 2.9* 2.7* 3.2* 3.6  CL 112 112 112 111 109  CO2 18* 20 20 23 26   GLUCOSE 284* 119* 241* 213* 121*  BUN 18 15 12 8 6   CREATININE 0.67 0.54 0.59 0.47* 0.44*  CALCIUM 8.9 9.2 8.8 8.5 8.3*   Liver Function Tests:  Recent Labs Lab 10/15/14 1450  AST 17  ALT 18  ALKPHOS 118*  BILITOT 0.8  PROT 9.5*  ALBUMIN 5.2    Recent Labs Lab 10/15/14 1450  LIPASE 16   No results for input(s): AMMONIA in the last 168 hours. CBC:  Recent Labs Lab 10/15/14 1450  WBC 20.7*  NEUTROABS 17.3*  HGB 16.7*  HCT 47.9*  MCV 87.7  PLT 428*   Cardiac Enzymes: No results for input(s): CKTOTAL, CKMB, CKMBINDEX, TROPONINI in the last 168 hours. BNP: BNP (last 3 results) No results for input(s): BNP in the last 8760 hours.  ProBNP (last 3 results) No results for input(s): PROBNP in the last 8760 hours.  CBG:  Recent Labs Lab 10/17/14 2020 10/17/14 2357 10/18/14 0401 10/18/14 0757 10/18/14 0918  GLUCAP 153* 91 133* 64* 113*       Signed:  Immaculate Crutcher  [facility] 10/18/2014, 11:26 AM

## 2014-10-18 NOTE — Progress Notes (Signed)
Hypoglycemic Event  CBG: 64  Treatment: 15 GM carbohydrate snack  Symptoms: None  Follow-up CBG: Time: 0918 CBG Result: 113  Possible Reasons for Event: Inadequate meal intake  Comments/MD notified: Dr. Michaelle Birks, [facility]  Remember to initiate Hypoglycemia Order Set & complete

## 2014-10-18 NOTE — Progress Notes (Signed)
Patient with orders to be discharged home. Discharge instructions given, patient verbalized understanding. Patient stable. Patient left in private vehicle with family.  

## 2014-10-18 NOTE — Discharge Instructions (Signed)
Diabetic Ketoacidosis °Diabetic ketoacidosis (DKA) is a life-threatening complication of type 1 diabetes. It must be quickly recognized and treated. Treatment requires hospitalization. °CAUSES  °When there is no insulin in the body, glucose (sugar) cannot be used, and the body breaks down fat for energy. When fat breaks down, acids (ketones) build up in the blood. Very high levels of glucose and high levels of acids lead to severe loss of body fluids (dehydration) and other dangerous chemical changes. This stresses your vital organs and can cause coma or death. °SIGNS AND SYMPTOMS  °· Tiredness (fatigue). °· Weight loss. °· Excessive thirst. °· Ketones in your urine. °· Light-headedness. °· Fruity or sweet smelling breath. °· Excessive urination. °· Visual changes. °· Confusion or irritability. °· Nausea or vomiting. °· Rapid breathing. °· Stomachache or abdominal pain. °DIAGNOSIS  °Your health care provider will diagnose DKA based on your history, physical exam, and blood tests. The health care provider will check to see if you have another illness that caused you to go into DKA. Most of this will be done quickly in an emergency room. °TREATMENT  °· Fluid replacement to correct dehydration. °· Insulin. °· Correction of electrolytes, such as potassium and sodium. °· Antibiotic medicines. °PREVENTION °· Always take your insulin. Do not skip your insulin injections. °· If you are sick, treat yourself quickly. Your body often needs more insulin to fight the illness. °· Check your blood glucose regularly. °· Check urine ketones if your blood glucose is greater than 240 milligrams per deciliter (mg/dL). °· Do not use outdated (expired) insulin. °· If your blood glucose is high, drink plenty of fluids. This helps flush out ketones. °HOME CARE INSTRUCTIONS  °· If you are sick, follow the advice of your health care provider. °· To prevent dehydration, drink enough water and fluids to keep your urine clear or pale  yellow. °¨ If you cannot eat, alternate between drinking fluids with sugar (soda, juices, flavored gelatin) and salty fluids (broth, bouillon). °¨ If you can eat, follow your usual diet and drink sugar-free liquids (water, diet drinks). °· Always take your usual dose of insulin. If you cannot eat or if your glucose is getting too low, call your health care provider for further instructions. °· Continue to monitor your blood or urine ketones every 3-4 hours around the clock. Set your alarm clock or have someone wake you up. If you are too sick, have someone test it for you. °· Rest and avoid exercise. °SEEK MEDICAL CARE IF:  °· You have a fever. °· You have ketones in your urine, or your blood glucose is higher than a level your health care provider suggests. You may need extra insulin. Call your health care provider if you need advice on adjusting your insulin. °· You cannot drink at least a tablespoon (15 mL) of fluid every 15-20 minutes. °· You have been vomiting for more than 2 hours. °· You have symptoms of DKA: °¨ Fruity smelling breath. °¨ Breathing faster or slower. °¨ Becoming very sleepy. °SEEK IMMEDIATE MEDICAL CARE IF:  °· You have signs of dehydration: °¨ Decreased urination. °¨ Increased thirst. °¨ Dry skin and mouth. °¨ Light-headedness. °· Your blood glucose is very high (as advised by your health care provider) twice in a row. °· You faint. °· You have chest pain or trouble breathing. °· You have a sudden, severe headache. °· You have sudden weakness in one arm or one leg. °· You have sudden trouble speaking or swallowing. °· You   have vomiting or diarrhea that is getting worse after 3 hours. °· You have abdominal pain. °MAKE SURE YOU:  °· Understand these instructions. °· Will watch your condition. °· Will get help right away if you are not doing well or get worse. °Document Released: 06/13/2000 Document Revised: 06/21/2013 Document Reviewed: 12/20/2008 °Exit[employer]® Patient Information ©2015 [employer]  [employer]. This information is not intended to replace advice given to you by your health care provider. Make sure you discuss any questions you have with your health care provider. ° °

## 2014-10-25 ENCOUNTER — Ambulatory Visit: Payer: BC Managed Care – PPO | Admitting: Family Medicine

## 2014-10-27 ENCOUNTER — Ambulatory Visit (INDEPENDENT_AMBULATORY_CARE_PROVIDER_SITE_OTHER): Payer: BC Managed Care – PPO | Admitting: Family Medicine

## 2014-10-27 ENCOUNTER — Encounter: Payer: Self-pay | Admitting: Family Medicine

## 2014-10-27 VITALS — BP 120/82 | Ht 62.0 in | Wt 120.0 lb

## 2014-10-27 DIAGNOSIS — G43909 Migraine, unspecified, not intractable, without status migrainosus: Secondary | ICD-10-CM | POA: Insufficient documentation

## 2014-10-27 DIAGNOSIS — G43709 Chronic migraine without aura, not intractable, without status migrainosus: Secondary | ICD-10-CM

## 2014-10-27 MED ORDER — SUMATRIPTAN SUCCINATE 50 MG PO TABS: 50.0000 mg | ORAL_TABLET | Freq: Once | ORAL | Status: DC

## 2014-10-27 MED ORDER — ONDANSETRON 4 MG PO TBDP: 4.0000 mg | ORAL_TABLET | Freq: Three times a day (TID) | ORAL | Status: DC | PRN

## 2014-10-27 NOTE — Progress Notes (Signed)
   Subjective:    Patient ID: Linda Walls, female    DOB: Dec 18, 1990, 24 y.o.   MRN: [id number]  HPI Patient arrives for a follow up from recent hospitalization for diabetic ketoacidosis   Appetite not the best, also notes ongoing diminished energy post hospitalization.  Hospital notes reviewed and presence of patient. No flare of pancreas enzymes during admission. On  On further history patient feels that her migraine initiated this last episode of diabetic ketoacidosis.  Developed migr early n, and then started vomiting, and dim energy, significant family history of migraine headache.  Due to see dr nida in two weeks.  Not ready yet,  Sugars running with low numbers well .  imitex and zofran disc with ot at length    Review of Systems No headache no chest pain no abdominal pain no back pain positive fatigue    Objective:   Physical Exam Alert vitals stable HEENT normal neuro intact lungs clear heart regular in rhythm abdomen slight epigastric discomfort no rebound no guarding no masses       Assessment & Plan:  Impression migraine headaches discussed at length. Used Excedrin in the past but no longer helpful #2 status post admission for DKA discussed at length #3 chronic hereditary pancreatitis discussed plan initiate Imitrex 50 mg at first sign of headache may repeat 120 hours later. Zofran when necessary for nausea. No neuro imaging at this time rationale discussed. Follow-up with endocrinologist. Dena Billet

## 2014-11-15 ENCOUNTER — Other Ambulatory Visit: Payer: Self-pay | Admitting: Family Medicine

## 2014-11-29 ENCOUNTER — Other Ambulatory Visit: Payer: Self-pay | Admitting: Family Medicine

## 2014-11-29 NOTE — Telephone Encounter (Signed)
Sorry for which med??

## 2014-11-29 NOTE — Telephone Encounter (Signed)
Ok may ref prn

## 2014-11-29 NOTE — Telephone Encounter (Signed)
Last seen 10/27/14

## 2014-12-28 ENCOUNTER — Other Ambulatory Visit: Payer: Self-pay | Admitting: Family Medicine

## 2014-12-29 ENCOUNTER — Telehealth: Payer: Self-pay | Admitting: Family Medicine

## 2014-12-29 NOTE — Telephone Encounter (Signed)
Rx prior auth APPROVED for pt's omeprazole (PRILOSEC) 20 MG capsule, Case# [id number], valid 11/28/14-12/28/15 through [place], faxed approval to [facility]

## 2015-01-16 ENCOUNTER — Other Ambulatory Visit: Payer: Self-pay | Admitting: Family Medicine

## 2015-01-31 ENCOUNTER — Encounter: Payer: Self-pay | Admitting: Family Medicine

## 2015-01-31 ENCOUNTER — Ambulatory Visit (INDEPENDENT_AMBULATORY_CARE_PROVIDER_SITE_OTHER): Payer: BC Managed Care – PPO | Admitting: Family Medicine

## 2015-01-31 VITALS — BP 112/78 | Ht 62.5 in | Wt 115.0 lb

## 2015-01-31 DIAGNOSIS — K859 Acute pancreatitis without necrosis or infection, unspecified: Secondary | ICD-10-CM

## 2015-01-31 DIAGNOSIS — G43709 Chronic migraine without aura, not intractable, without status migrainosus: Secondary | ICD-10-CM

## 2015-01-31 DIAGNOSIS — G8929 Other chronic pain: Secondary | ICD-10-CM

## 2015-01-31 DIAGNOSIS — K219 Gastro-esophageal reflux disease without esophagitis: Secondary | ICD-10-CM

## 2015-01-31 DIAGNOSIS — R1013 Epigastric pain: Secondary | ICD-10-CM

## 2015-01-31 MED ORDER — PROMETHAZINE HCL 25 MG PO TABS: ORAL_TABLET | ORAL | Status: DC

## 2015-01-31 MED ORDER — TOPIRAMATE 25 MG PO TABS: ORAL_TABLET | ORAL | Status: DC

## 2015-01-31 MED ORDER — OXYCODONE-ACETAMINOPHEN 7.5-325 MG PO TABS: ORAL_TABLET | ORAL | Status: DC

## 2015-01-31 MED ORDER — PANCRELIPASE (LIP-PROT-AMYL) 6000-19000 UNITS PO CPEP: 2.0000 | ORAL_CAPSULE | Freq: Three times a day (TID) | ORAL | Status: DC

## 2015-01-31 MED ORDER — OMEPRAZOLE 20 MG PO CPDR: 20.0000 mg | DELAYED_RELEASE_CAPSULE | Freq: Every day | ORAL | Status: DC

## 2015-01-31 MED ORDER — ONDANSETRON 4 MG PO TBDP
ORAL_TABLET | ORAL | Status: DC
Start: 2015-01-31 — End: 2015-11-06

## 2015-01-31 NOTE — Progress Notes (Signed)
   Subjective:    Patient ID: Linda Walls, female    DOB: [birth year], 24 y.o.   MRN: [id number]  HPIMed check up on headaches. Needs refills on immtrex, oxycodone,  and nausea meds.   Takes reflux med prn  Exercise three or four ties per wk  abd more discomfort of late, comes and goes  On gi pancreatic supp   Used early feb took   Pt sees diabetic specialist.   Needs refills on  Reflux med  . Taking omeprazole.  Things going welll  Sees diab doc not improving mot much, 9.0   imitrex helps but of ten take stwo ,m, often multi per wk  fullt ime work, at [employer]  Intermittent migraine headaches. As many as 3-4 per week at times.  No chest pain no shortness breath  Reflux acts up at times.  Uses oxycodone when necessary primarily for flares of chronic abdominal pain.  Works on [employer]    needs refill on pancrelipase.  Pt states no concerns today.    Review of Systems No vomiting positive nausea no rash no chest pain    Objective:   Physical Exam  Alert vitals stable. HEENT normal. Lungs clear. Heart regular in rhythm. Abdomen benign.      Assessment & Plan:  Impression 1 chronic pancreatitis #2 type 1 diabetes followed by specialist #3 reflux stable on meds #4 Ogren headaches worsening particularly in terms of frequency discuss need for primary prophylaxis plan initiate Topamax appropriate dose. Chronic meds refilled. Oxycodone which patient uses on a when necessary basis refilled. Diet exercise discussed encouraged check every 6 months [name]

## 2015-02-22 ENCOUNTER — Encounter (HOSPITAL_COMMUNITY): Payer: Self-pay

## 2015-02-22 ENCOUNTER — Encounter: Payer: Self-pay | Admitting: Family Medicine

## 2015-02-22 ENCOUNTER — Ambulatory Visit (INDEPENDENT_AMBULATORY_CARE_PROVIDER_SITE_OTHER): Payer: BC Managed Care – PPO | Admitting: Family Medicine

## 2015-02-22 ENCOUNTER — Inpatient Hospital Stay (HOSPITAL_COMMUNITY)
Admission: EM | Admit: 2015-02-22 | Discharge: 2015-02-24 | DRG: 638 | Disposition: A | Discharge status: Home / Self Care | Payer: BC Managed Care – PPO | Attending: Internal Medicine | Admitting: Internal Medicine

## 2015-02-22 VITALS — BP 100/52 | Temp 97.5°F | Ht 62.5 in | Wt 103.4 lb

## 2015-02-22 DIAGNOSIS — E109 Type 1 diabetes mellitus without complications: Secondary | ICD-10-CM | POA: Diagnosis present

## 2015-02-22 DIAGNOSIS — R112 Nausea with vomiting, unspecified: Secondary | ICD-10-CM | POA: Diagnosis not present

## 2015-02-22 DIAGNOSIS — E1010 Type 1 diabetes mellitus with ketoacidosis without coma: Secondary | ICD-10-CM | POA: Diagnosis not present

## 2015-02-22 DIAGNOSIS — Z882 Allergy status to sulfonamides status: Secondary | ICD-10-CM | POA: Diagnosis not present

## 2015-02-22 DIAGNOSIS — E876 Hypokalemia: Secondary | ICD-10-CM | POA: Diagnosis present

## 2015-02-22 DIAGNOSIS — E0810 Diabetes mellitus due to underlying condition with ketoacidosis without coma: Secondary | ICD-10-CM

## 2015-02-22 DIAGNOSIS — Z91013 Allergy to seafood: Secondary | ICD-10-CM | POA: Diagnosis not present

## 2015-02-22 DIAGNOSIS — K219 Gastro-esophageal reflux disease without esophagitis: Secondary | ICD-10-CM | POA: Diagnosis present

## 2015-02-22 DIAGNOSIS — E1110 Type 2 diabetes mellitus with ketoacidosis without coma: Secondary | ICD-10-CM | POA: Diagnosis present

## 2015-02-22 DIAGNOSIS — Z803 Family history of malignant neoplasm of breast: Secondary | ICD-10-CM

## 2015-02-22 DIAGNOSIS — K859 Acute pancreatitis without necrosis or infection, unspecified: Secondary | ICD-10-CM | POA: Diagnosis present

## 2015-02-22 DIAGNOSIS — E860 Dehydration: Secondary | ICD-10-CM | POA: Diagnosis present

## 2015-02-22 DIAGNOSIS — R1013 Epigastric pain: Secondary | ICD-10-CM

## 2015-02-22 DIAGNOSIS — Z794 Long term (current) use of insulin: Secondary | ICD-10-CM | POA: Diagnosis not present

## 2015-02-22 DIAGNOSIS — K861 Other chronic pancreatitis: Secondary | ICD-10-CM | POA: Diagnosis present

## 2015-02-22 DIAGNOSIS — Z8249 Family history of ischemic heart disease and other diseases of the circulatory system: Secondary | ICD-10-CM

## 2015-02-22 DIAGNOSIS — Z880 Allergy status to penicillin: Secondary | ICD-10-CM | POA: Diagnosis not present

## 2015-02-22 LAB — BASIC METABOLIC PANEL
ANION GAP: 21 — AB (ref 5–15)
ANION GAP: 12 (ref 5–15)
BUN: 16 mg/dL (ref 6–20)
BUN: 11 mg/dL (ref 6–20)
CALCIUM: 8.9 mg/dL (ref 8.9–10.3)
CO2: 11 mmol/L — ABNORMAL LOW (ref 22–32)
CO2: 13 mmol/L — ABNORMAL LOW (ref 22–32)
CREATININE: 0.76 mg/dL (ref 0.44–1.00)
Calcium: 8.2 mg/dL — ABNORMAL LOW (ref 8.9–10.3)
Chloride: 109 mmol/L (ref 101–111)
Chloride: 112 mmol/L — ABNORMAL HIGH (ref 101–111)
Creatinine, Ser: 1.06 mg/dL — ABNORMAL HIGH (ref 0.44–1.00)
GFR calc Af Amer: >60 mL/min (ref >60)
Glucose, Bld: 282 mg/dL — ABNORMAL HIGH (ref 65–99)
Glucose, Bld: 170 mg/dL — ABNORMAL HIGH (ref 65–99)
Potassium: 4.1 mmol/L (ref 3.5–5.1)
Potassium: 3.8 mmol/L (ref 3.5–5.1)
Sodium: 141 mmol/L (ref 135–145)
Sodium: 137 mmol/L (ref 135–145)

## 2015-02-22 LAB — CBC
HCT: 44.3 % (ref 36.0–46.0)
Hemoglobin: 15 g/dL (ref 12.0–15.0)
MCH: 31.1 pg (ref 26.0–34.0)
MCHC: 33.9 g/dL (ref 30.0–36.0)
MCV: 91.7 fL (ref 78.0–100.0)
Platelets: 537 10*3/uL — ABNORMAL HIGH (ref 150–400)
RBC: 4.83 MIL/uL (ref 3.87–5.11)
RDW: 12.6 % (ref 11.5–15.5)
WBC: 29.3 10*3/uL — ABNORMAL HIGH (ref 4.0–10.5)

## 2015-02-22 LAB — HEPATIC FUNCTION PANEL
ALBUMIN: 5.4 g/dL — AB (ref 3.5–5.0)
ALT: 22 U/L (ref 14–54)
AST: 32 U/L (ref 15–41)
Alkaline Phosphatase: 134 U/L — ABNORMAL HIGH (ref 38–126)
Bilirubin, Direct: 0.1 mg/dL (ref 0.1–0.5)
Indirect Bilirubin: 1.5 mg/dL — ABNORMAL HIGH (ref 0.3–0.9)
TOTAL PROTEIN: 9.1 g/dL — AB (ref 6.5–8.1)
Total Bilirubin: 1.6 mg/dL — ABNORMAL HIGH (ref 0.3–1.2)

## 2015-02-22 LAB — URINALYSIS, ROUTINE W REFLEX MICROSCOPIC
Bilirubin Urine: NEGATIVE
Glucose, UA: >1000 mg/dL — AB
LEUKOCYTES UA: NEGATIVE
Nitrite: NEGATIVE
PROTEIN: NEGATIVE mg/dL
Specific Gravity, Urine: 1.02 (ref 1.005–1.030)
UROBILINOGEN UA: 0.2 mg/dL (ref 0.0–1.0)
pH: 5.5 (ref 5.0–8.0)

## 2015-02-22 LAB — BLOOD GAS, VENOUS
ACID-BASE DEFICIT: 13.1 mmol/L — AB (ref 0.0–2.0)
Bicarbonate: 12.2 mEq/L — ABNORMAL LOW (ref 20.0–24.0)
FIO2: 0.21
O2 SAT: 90.8 %
PATIENT TEMPERATURE: 37
PO2 VEN: 66.5 mmHg — AB (ref 30.0–45.0)
TCO2: 11.1 mmol/L (ref 0–100)
pCO2, Ven: 26.5 mmHg — ABNORMAL LOW (ref 45.0–50.0)
pH, Ven: 7.287 (ref 7.250–7.300)

## 2015-02-22 LAB — CBG MONITORING, ED
GLUCOSE-CAPILLARY: 182 mg/dL — AB (ref 65–99)
Glucose-Capillary: 190 mg/dL — ABNORMAL HIGH (ref 65–99)

## 2015-02-22 LAB — GLUCOSE, CAPILLARY
GLUCOSE-CAPILLARY: 119 mg/dL — AB (ref 65–99)
GLUCOSE-CAPILLARY: 177 mg/dL — AB (ref 65–99)
Glucose-Capillary: 404 mg/dL — ABNORMAL HIGH (ref 65–99)
Glucose-Capillary: 176 mg/dL — ABNORMAL HIGH (ref 65–99)
Glucose-Capillary: 159 mg/dL — ABNORMAL HIGH (ref 65–99)
Glucose-Capillary: 164 mg/dL — ABNORMAL HIGH (ref 65–99)

## 2015-02-22 LAB — URINE MICROSCOPIC-ADD ON

## 2015-02-22 LAB — LIPASE, BLOOD: LIPASE: 13 U/L — AB (ref 22–51)

## 2015-02-22 MED ORDER — PROMETHAZINE HCL 25 MG/ML IJ SOLN
12.5000 mg | Freq: Once | INTRAMUSCULAR | Status: AC
  Administered 2015-02-22: 12.5 mg via INTRAVENOUS
  Filled 2015-02-22: qty 1

## 2015-02-22 MED ORDER — ENOXAPARIN SODIUM 40 MG/0.4ML ~~LOC~~ SOLN
40.0000 mg | SUBCUTANEOUS | Status: DC
  Filled 2015-02-22: qty 0.4

## 2015-02-22 MED ORDER — SODIUM CHLORIDE 0.9 % IV SOLN
INTRAVENOUS | Status: AC
  Administered 2015-02-22: 18:00:00 via INTRAVENOUS

## 2015-02-22 MED ORDER — FENTANYL CITRATE (PF) 100 MCG/2ML IJ SOLN
50.0000 ug | Freq: Once | INTRAMUSCULAR | Status: AC
  Administered 2015-02-22: 50 ug via INTRAVENOUS
  Filled 2015-02-22: qty 2

## 2015-02-22 MED ORDER — DEXTROSE-NACL 5-0.45 % IV SOLN
INTRAVENOUS | Status: DC
  Administered 2015-02-23 (×2): via INTRAVENOUS

## 2015-02-22 MED ORDER — FENTANYL CITRATE (PF) 100 MCG/2ML IJ SOLN
100.0000 ug | Freq: Once | INTRAMUSCULAR | Status: AC
  Administered 2015-02-22: 100 ug via INTRAVENOUS
  Filled 2015-02-22: qty 2

## 2015-02-22 MED ORDER — PANCRELIPASE (LIP-PROT-AMYL) 12000-38000 UNITS PO CPEP
1.0000 | ORAL_CAPSULE | Freq: Three times a day (TID) | ORAL | Status: DC
Start: 2015-02-23 — End: 2015-02-24
  Administered 2015-02-23 – 2015-02-24 (×3): 12000 [IU] via ORAL
  Filled 2015-02-22 (×4): qty 1

## 2015-02-22 MED ORDER — ONDANSETRON HCL 4 MG/2ML IJ SOLN
4.0000 mg | Freq: Once | INTRAMUSCULAR | Status: AC
  Administered 2015-02-22: 4 mg via INTRAVENOUS
  Filled 2015-02-22: qty 2

## 2015-02-22 MED ORDER — POTASSIUM CHLORIDE 10 MEQ/100ML IV SOLN
10.0000 meq | INTRAVENOUS | Status: AC
  Administered 2015-02-22: 10 meq via INTRAVENOUS
  Filled 2015-02-22 (×2): qty 100

## 2015-02-22 MED ORDER — SODIUM CHLORIDE 0.9 % IV SOLN: INTRAVENOUS | Status: DC

## 2015-02-22 MED ORDER — OXYCODONE-ACETAMINOPHEN 7.5-325 MG PO TABS
1.0000 | ORAL_TABLET | Freq: Three times a day (TID) | ORAL | Status: DC | PRN
  Administered 2015-02-22 – 2015-02-24 (×3): 1 via ORAL
  Filled 2015-02-22 (×3): qty 1

## 2015-02-22 MED ORDER — SODIUM CHLORIDE 0.9 % IV BOLUS (SEPSIS)
1000.0000 mL | Freq: Once | INTRAVENOUS | Status: AC
  Administered 2015-02-22: 1000 mL via INTRAVENOUS

## 2015-02-22 MED ORDER — INFLUENZA VAC SPLIT QUAD 0.5 ML IM SUSY
0.5000 mL | PREFILLED_SYRINGE | INTRAMUSCULAR | Status: AC
  Administered 2015-02-23: 0.5 mL via INTRAMUSCULAR
  Filled 2015-02-22: qty 0.5

## 2015-02-22 MED ORDER — PANTOPRAZOLE SODIUM 40 MG IV SOLR
40.0000 mg | INTRAVENOUS | Status: DC
  Administered 2015-02-22 – 2015-02-23 (×2): 40 mg via INTRAVENOUS
  Filled 2015-02-22 (×2): qty 40

## 2015-02-22 MED ORDER — INSULIN REGULAR HUMAN 100 UNIT/ML IJ SOLN
INTRAMUSCULAR | Status: DC
  Filled 2015-02-22: qty 2.5

## 2015-02-22 MED ORDER — DEXTROSE-NACL 5-0.45 % IV SOLN
INTRAVENOUS | Status: DC
  Administered 2015-02-22: 15:00:00 via INTRAVENOUS

## 2015-02-22 MED ORDER — ONDANSETRON HCL 4 MG/2ML IJ SOLN
4.0000 mg | Freq: Four times a day (QID) | INTRAMUSCULAR | Status: DC | PRN
Start: 2015-02-22 — End: 2015-02-24
  Administered 2015-02-22 – 2015-02-23 (×2): 4 mg via INTRAVENOUS
  Filled 2015-02-22 (×2): qty 2

## 2015-02-22 MED ORDER — HYOSCYAMINE SULFATE 0.125 MG SL SUBL: 0.1250 mg | SUBLINGUAL_TABLET | SUBLINGUAL | Status: DC | PRN

## 2015-02-22 MED ORDER — SODIUM CHLORIDE 0.9 % IV SOLN
INTRAVENOUS | Status: DC
  Administered 2015-02-22: 1.3 [IU]/h via INTRAVENOUS
  Filled 2015-02-22: qty 2.5

## 2015-02-22 NOTE — ED Notes (Signed)
CBG 440 in triage

## 2015-02-22 NOTE — ED Notes (Signed)
Pt c/o complications with pancreatitis and diabetes experiencing vomiting and stomach pain.

## 2015-02-22 NOTE — ED Provider Notes (Signed)
CSN: [id number]     Arrival date & time 02/22/15  1145 History  This chart was scribe for Davonna Belling, MD by Judithann Sauger, ED Scribe. The patient was seen in room APA11/APA11 and the patient's care was started at 12:00 PM.    Chief Complaint  Patient presents with  . Pancreatitis  . Diabetes  . Hyperglycemia  . Abdominal Pain   The history is provided by the patient. No language interpreter was used.   HPI Comments: Linda Walls is a 24 y.o. female with a hx of hereditary pancreatitis and Type 1 DM who presents to the Emergency Department complaining of a possible pancreatitis flare up onset 12 hours ago. She reports associated abdominal pain, vomiting, mild diarrhea which she attributes to her migraine medication, low grade fever, and sore throat. She denies feeling better after vomiting. She also denies any cough. She denies any sick contact. She states that her blood sugar has been on-and-off high.   Past Medical History  Diagnosis Date  . Hereditary pancreatitis   . DM type 1 (diabetes mellitus, type 1)   . Hernia   . GERD (gastroesophageal reflux disease)   . Jaundice    Past Surgical History  Procedure Laterality Date  . Hernia repair      As infant.  . Bile duct stent placement  2006 at [facility]  . Pancreatic cyst drainage  at age 61 year   Family History  Problem Relation Age of Onset  . Pancreatitis Father 30    hereditary  . Hypertension Father   . GER disease Father   . Breast cancer Maternal Aunt   . Congenital heart disease Maternal Grandfather   . Prostate cancer Maternal Grandfather    Social History  Substance Use Topics  . Smoking status: Never Smoker   . Smokeless tobacco: Never Used  . Alcohol Use: No   OB History    No data available     Review of Systems  Constitutional: Positive for fever. Negative for chills.  Respiratory: Negative for cough.   Gastrointestinal: Positive for nausea, vomiting, abdominal pain and diarrhea.       Allergies  Cefzil; Shellfish allergy; Penicillins; and Sulfa antibiotics  Home Medications   Prior to Admission medications   Medication Sig Start Date End Date Taking? Authorizing Provider  ferrous sulfate 325 (65 FE) MG tablet Take 325 mg by mouth daily with breakfast.   Yes Historical Provider, MD  hyoscyamine (LEVSIN SL) 0.125 MG SL tablet PLACE 1 TABLET UNDER THE TONGUE EVERY 4 HOURS AS NEEDED FOR PAIN. Patient taking differently: Take 0.125 mg by mouth every 4 (four) hours as needed. PLACE 1 TABLET UNDER THE TONGUE EVERY 4 HOURS AS NEEDED FOR PAIN. 08/04/14  Yes [name], MD  ibuprofen (ADVIL,MOTRIN) 200 MG tablet Take 400 mg by mouth every 6 (six) hours as needed. pain   Yes Historical Provider, MD  insulin aspart (NOVOLOG) 100 UNIT/ML injection Inject 6 Units into the skin 3 (three) times daily before meals. *Increase subcutaneous injection by 1 unit for every 50 units over 150.* Patient taking differently: Inject 8 Units into the skin 3 (three) times daily before meals. *Increase subcutaneous injection by 1 unit for every 50 units over 150.* 04/02/13  Yes [name], MD  Insulin Glargine (TOUJEO SOLOSTAR) 300 UNIT/ML SOPN Inject 30 Units into the skin every evening.   Yes Historical Provider, MD  Multiple Vitamins-Minerals (MULTIVITAMINS THER. W/MINERALS) TABS Take 1 tablet by mouth daily. For  energy   Yes Historical Provider, MD  omeprazole (PRILOSEC) 20 MG capsule Take 1 capsule (20 mg total) by mouth daily. 01/31/15  Yes [name], MD  ondansetron (ZOFRAN-ODT) 4 MG disintegrating tablet DISSOLVE 1 TABLET BY MOUTH EVERY 8 HOURS AS NEEDED FOR NAUSEA OR VOMITING. 01/31/15  Yes [name], MD  oxyCODONE-acetaminophen (PERCOCET) 7.5-325 MG per tablet Take one tablet up to BID prn pain 01/31/15  Yes [name], MD  Pancrelipase, Lip-Prot-Amyl, 6000 UNITS CPEP Take 2 capsules (12,000 Units total) by mouth 3 (three) times daily before meals. 01/31/15  Yes  [name], MD  promethazine (PHENERGAN) 25 MG suppository Place 25 mg rectally every 6 (six) hours as needed for nausea or vomiting.   Yes Historical Provider, MD  promethazine (PHENERGAN) 25 MG tablet TAKE 1 TABLET BY MOUTH EVERY 6 HOURS AS NEEDED FOR NAUSEA. 01/31/15  Yes [name], MD  SUMAtriptan (IMITREX) 50 MG tablet TAKE 1 TABLET BY MOUTH ONCE. MAY REPEAT IN 2 HOURS IF HEADACHE PERSISTS OR RECURS. 01/16/15  Yes [name], MD  vitamin C (ASCORBIC ACID) 500 MG tablet Take 500 mg by mouth daily.   Yes Historical Provider, MD  topiramate (TOPAMAX) 25 MG tablet Take 25mg  qhs for 7 days, then 25mg  bid, then 25mg  qam and 50 mg qhs, then 50mg  bid Patient not taking: Reported on 02/22/2015 01/31/15   [name], MD   BP 114/69 mmHg  Pulse 121  Temp(Src) 98.2 F (36.8 C) (Oral)  Resp 16  Ht 5\' 2"  (1.575 m)  Wt 105 lb (47.628 kg)  BMI 19.20 kg/m2  SpO2 100% Physical Exam  Constitutional: She is oriented to person, place, and time. She appears well-developed and well-nourished. No distress.  Pale  HENT:  Head: Normocephalic and atraumatic.  Mouth/Throat: Posterior oropharyngeal erythema present.  Mild postpharyngeal erythema, without exudates  Eyes: Conjunctivae and EOM are normal.  Neck: Neck supple. No tracheal deviation present.  Cardiovascular: Normal rate.   Pulmonary/Chest: Effort normal. No respiratory distress.  Abdominal:  Mild left upper abdominal tenderness  Musculoskeletal: Normal range of motion.  Neurological: She is alert and oriented to person, place, and time.  Skin: Skin is warm and dry.  Psychiatric: She has a normal mood and affect. Her behavior is normal.  Nursing note and vitals reviewed.   ED Course  Procedures (including critical care time) DIAGNOSTIC STUDIES: Oxygen Saturation is 98% on RA, normal by my interpretation.    COORDINATION OF CARE: 12:06 PM- Pt advised of plan for treatment and pt agrees.    Labs Review Labs Reviewed   BASIC METABOLIC PANEL - Abnormal; Notable for the following:    CO2 11 (*)    Glucose, Bld 282 (*)    Creatinine, Ser 1.06 (*)    Anion gap 21 (*)    All other components within normal limits  CBC - Abnormal; Notable for the following:    WBC 29.3 (*)    Platelets 537 (*)    All other components within normal limits  URINALYSIS, ROUTINE W REFLEX MICROSCOPIC (NOT AT [facility]) - Abnormal; Notable for the following:    Glucose, UA >1000 (*)    Hgb urine dipstick MODERATE (*)    Ketones, ur >80 (*)    All other components within normal limits  BLOOD GAS, VENOUS - Abnormal; Notable for the following:    pCO2, Ven 26.5 (*)    pO2, Ven 66.5 (*)    Bicarbonate 12.2 (*)  Acid-base deficit 13.1 (*)    All other components within normal limits  HEPATIC FUNCTION PANEL - Abnormal; Notable for the following:    Total Protein 9.1 (*)    Albumin 5.4 (*)    Alkaline Phosphatase 134 (*)    Total Bilirubin 1.6 (*)    Indirect Bilirubin 1.5 (*)    All other components within normal limits  LIPASE, BLOOD - Abnormal; Notable for the following:    Lipase 13 (*)    All other components within normal limits  URINE MICROSCOPIC-ADD ON - Abnormal; Notable for the following:    Squamous Epithelial / LPF MANY (*)    All other components within normal limits  CBG MONITORING, ED - Abnormal; Notable for the following:    Glucose-Capillary 190 (*)    All other components within normal limits    Imaging Review No results found. I have personally reviewed and evaluated these images and lab results as part of my medical decision-making.   EKG Interpretation None      MDM   Final diagnoses:  Diabetic ketoacidosis without coma associated with type 1 diabetes mellitus  Non-intractable vomiting with nausea, vomiting of unspecified type    Patient with nausea vomiting abdominal pain. Appears to be in DKA. History of pancreatitis. Has had episodes like this in the past. Still not tolerating orals. Will  admit to internal medicine.  CRITICAL CARE Performed by: Mackie Pai Total critical care time: 30 Critical care time was exclusive of separately billable procedures and treating other patients. Critical care was necessary to treat or prevent imminent or life-threatening deterioration. Critical care was time spent personally by me on the following activities: development of treatment plan with patient and/or surrogate as well as nursing, discussions with consultants, evaluation of patient's response to treatment, examination of patient, obtaining history from patient or surrogate, ordering and performing treatments and interventions, ordering and review of laboratory studies, ordering and review of radiographic studies, pulse oximetry and re-evaluation of patient's condition.  I personally performed the services described in this documentation, which was scribed in my presence. The recorded information has been reviewed and is accurate.     Davonna Belling, MD 02/22/15 [phone]

## 2015-02-22 NOTE — Progress Notes (Signed)
   Subjective:    Patient ID: Linda Walls, female    DOB: [birth year], 24 y.o.   MRN: [id number]  HPI  Patient arrives with c/o vomiting for 3 weeks. Testing patient was up all night vomiting. Every hour. Severe mid abdominal pain. Positive nausea at all other times. No diarrhea. No fever  Also has type 1 diabetes. Sugars were elevated now too high to measure. Patient notes significant headache.  Feels lightheaded like she may want to pass out.  Pain is severe in the midabdomen. This is reminiscent of last time patient had pancreatitis. Family also worried about symptom such as pseudocyst which the patient had many years ago.  Sugar was low this morning but now reading just high. Also, Review of Systems Positive headache positive diminished energy positive nausea positive lightheadedness    Objective:   Physical Exam  Alert substantial malaise vital stable pulse 120 afebrile HEENT normal. Lungs clear. Heart regular in rhythm epigastrium distinctly tender bowel sounds present mucous membranes somewhat dry      Assessment & Plan:  Impression flare of acute pancreatitis with also likely diabetic ketoacidosis. This combination warrants a visit to the hospital. At least fluids and diagnostics and probable admission discussed at length. Also discussed with ER doctor. Patient proceeded to emergency room [name]

## 2015-02-22 NOTE — H&P (Signed)
[facility] History and Physical  ZAMYAH WIESMAN YQM:[id number] DOB: 07-Jul-1990 DOA: 02/22/2015  Referring physician: Dr. Alvino Chapel, ER PCP: [name], MD   Chief Complaint: nausea and vomiting.  HPI: KHALAYA MCGURN is a 24 y.o. female with a history of hereditary chronic pancreatitis, type 1 diabetes presents to the emergency room with complaints of nausea and vomiting. Patient had initially gone to her primary care physician earlier today and upon evaluation was noted to be tachycardic. She was sent to the ER for evaluation. She reports experiencing nausea and vomiting that began earlier today. She also describes having intermittent nausea and vomiting for the past week. She has chronic left-sided abdominal pain related to her pancreatitis which is unchanged. She denies any fever. She's not had any diarrhea or dysuria.  She was noted to be clinically dehydrated. She reports compliance with her insulin as well as her diet. She reports checking her blood sugar at home and it was noted to be too high to detect. Upon arrival to the emergency room she was noted to have a serum bicarbonate of 11 and an anion gap of 21.She was found to have diabetic ketoacidosis and admitted to the hospital for further treatments.   Review of Systems:  Pertinent positives as per HPI, otherwise negative   Past Medical History  Diagnosis Date  . Hereditary pancreatitis   . DM type 1 (diabetes mellitus, type 1)   . Hernia   . GERD (gastroesophageal reflux disease)   . Jaundice    Past Surgical History  Procedure Laterality Date  . Hernia repair      As infant.  . Bile duct stent placement  2006 at [facility]  . Pancreatic cyst drainage  at age 68 year   Social History:  reports that she has never smoked. She has never used smokeless tobacco. She reports that she does not drink alcohol or use illicit drugs.  Allergies  Allergen Reactions  . Cefzil [Cefprozil]     Sickness  . Shellfish Allergy  Nausea And Vomiting  . Penicillins Rash  . Sulfa Antibiotics Rash    Family History  Problem Relation Age of Onset  . Pancreatitis Father 30    hereditary  . Hypertension Father   . GER disease Father   . Breast cancer Maternal Aunt   . Congenital heart disease Maternal Grandfather   . Prostate cancer Maternal Grandfather      Prior to Admission medications   Medication Sig Start Date End Date Taking? Authorizing Provider  ferrous sulfate 325 (65 FE) MG tablet Take 325 mg by mouth daily with breakfast.   Yes Historical Provider, MD  hyoscyamine (LEVSIN SL) 0.125 MG SL tablet PLACE 1 TABLET UNDER THE TONGUE EVERY 4 HOURS AS NEEDED FOR PAIN. Patient taking differently: Take 0.125 mg by mouth every 4 (four) hours as needed. PLACE 1 TABLET UNDER THE TONGUE EVERY 4 HOURS AS NEEDED FOR PAIN. 08/04/14  Yes [name], MD  ibuprofen (ADVIL,MOTRIN) 200 MG tablet Take 400 mg by mouth every 6 (six) hours as needed. pain   Yes Historical Provider, MD  insulin aspart (NOVOLOG) 100 UNIT/ML injection Inject 6 Units into the skin 3 (three) times daily before meals. *Increase subcutaneous injection by 1 unit for every 50 units over 150.* Patient taking differently: Inject 8 Units into the skin 3 (three) times daily before meals. *Increase subcutaneous injection by 1 unit for every 50 units over 150.* 04/02/13  Yes [name], MD  Insulin Glargine (  TOUJEO SOLOSTAR) 300 UNIT/ML SOPN Inject 30 Units into the skin every evening.   Yes Historical Provider, MD  Multiple Vitamins-Minerals (MULTIVITAMINS THER. W/MINERALS) TABS Take 1 tablet by mouth daily. For energy   Yes Historical Provider, MD  omeprazole (PRILOSEC) 20 MG capsule Take 1 capsule (20 mg total) by mouth daily. 01/31/15  Yes [name], MD  ondansetron (ZOFRAN-ODT) 4 MG disintegrating tablet DISSOLVE 1 TABLET BY MOUTH EVERY 8 HOURS AS NEEDED FOR NAUSEA OR VOMITING. 01/31/15  Yes [name], MD  oxyCODONE-acetaminophen (PERCOCET)  7.5-325 MG per tablet Take one tablet up to BID prn pain 01/31/15  Yes [name], MD  Pancrelipase, Lip-Prot-Amyl, 6000 UNITS CPEP Take 2 capsules (12,000 Units total) by mouth 3 (three) times daily before meals. 01/31/15  Yes [name], MD  promethazine (PHENERGAN) 25 MG suppository Place 25 mg rectally every 6 (six) hours as needed for nausea or vomiting.   Yes Historical Provider, MD  promethazine (PHENERGAN) 25 MG tablet TAKE 1 TABLET BY MOUTH EVERY 6 HOURS AS NEEDED FOR NAUSEA. 01/31/15  Yes [name], MD  SUMAtriptan (IMITREX) 50 MG tablet TAKE 1 TABLET BY MOUTH ONCE. MAY REPEAT IN 2 HOURS IF HEADACHE PERSISTS OR RECURS. 01/16/15  Yes [name], MD  vitamin C (ASCORBIC ACID) 500 MG tablet Take 500 mg by mouth daily.   Yes Historical Provider, MD   Physical Exam: Filed Vitals:   02/22/15 1430 02/22/15 1500 02/22/15 1600 02/22/15 1630  BP: 113/75 114/69 107/64 100/63  Pulse: 98 121 102 94  Temp:      TempSrc:      Resp:      Height:      Weight:      SpO2: 95% 100% 100% 99%    Wt Readings from Last 3 Encounters:  02/22/15 47.628 kg (105 lb)  02/22/15 46.902 kg (103 lb 6.4 oz)  01/31/15 52.164 kg (115 lb)    General:  Appears calm and comfortable Eyes: PERRL, normal lids, irises & conjunctiva ENT: grossly normal hearing, lips & tongue Neck: no LAD, masses or thyromegaly Cardiovascular: s1, s2, tachycardic, no m/r/g. No LE edema. Telemetry: sinus tachycardia  Respiratory: CTA bilaterally, no w/r/r. Normal respiratory effort. Abdomen: soft, mild tenderness on left abdomen, bs+ Skin: no rash or induration seen on limited exam Musculoskeletal: grossly normal tone BUE/BLE Psychiatric: grossly normal mood and affect, speech fluent and appropriate Neurologic: grossly non-focal.          Labs on Admission:  Basic Metabolic Panel:  Recent Labs Lab 02/22/15 1336  NA 141  K 4.1  CL 109  CO2 11*  GLUCOSE 282*  BUN 16  CREATININE 1.06*  CALCIUM 8.9    Liver Function Tests:  Recent Labs Lab 02/22/15 1220  AST 32  ALT 22  ALKPHOS 134*  BILITOT 1.6*  PROT 9.1*  ALBUMIN 5.4*    Recent Labs Lab 02/22/15 1220  LIPASE 13*   No results for input(s): AMMONIA in the last 168 hours. CBC:  Recent Labs Lab 02/22/15 1220  WBC 29.3*  HGB 15.0  HCT 44.3  MCV 91.7  PLT 537*   Cardiac Enzymes: No results for input(s): CKTOTAL, CKMB, CKMBINDEX, TROPONINI in the last 168 hours.  BNP (last 3 results) No results for input(s): BNP in the last 8760 hours.  ProBNP (last 3 results) No results for input(s): PROBNP in the last 8760 hours.  CBG:  Recent Labs Lab 02/22/15 1504 02/22/15 1626  GLUCAP 190* 182*  Radiological Exams on Admission: No results found.  Assessment/Plan Principal Problem:   DKA (diabetic ketoacidoses) Active Problems:   Hereditary pancreatitis   DM type 1 (diabetes mellitus, type 1)   Dehydration   Nausea and vomiting   DKA, type 1   1. Diabetic ketoacidosis, type I. Patient will be admitted to the step down unit and started on aggressive hydration and IV insulin per DKA protocol. Will follow serial chemistries and transition to lantus once her anion gap has closed. Will keep patient NPO for now. 2. GERD. Continue PPI 3. Dehydration. Continue on IV fluids 4. DM type 1. Uncontrolled. Check hemoglobin A1C. Management as above. 5. Hereditary pancreatitis. Lipase in normal range. Continue to follow and treat supportively 6. Nausea and vomiting. Likely related to DKA. Will treat with anti emetics. Urinalysis unremarkable. Check urine pregnancy test.  Code Status: full code DVT Prophylaxis: lovenox Family Communication: discussed with patient and mother at the bedside Disposition Plan: discharge home once improved  Time spent: 3mins  Duan Scharnhorst [facility] Pager [phone]

## 2015-02-23 LAB — GLUCOSE, CAPILLARY
GLUCOSE-CAPILLARY: 161 mg/dL — AB (ref 65–99)
GLUCOSE-CAPILLARY: 155 mg/dL — AB (ref 65–99)
GLUCOSE-CAPILLARY: 151 mg/dL — AB (ref 65–99)
GLUCOSE-CAPILLARY: 146 mg/dL — AB (ref 65–99)
GLUCOSE-CAPILLARY: 266 mg/dL — AB (ref 65–99)
Glucose-Capillary: 142 mg/dL — ABNORMAL HIGH (ref 65–99)
Glucose-Capillary: 153 mg/dL — ABNORMAL HIGH (ref 65–99)
Glucose-Capillary: 165 mg/dL — ABNORMAL HIGH (ref 65–99)
Glucose-Capillary: 144 mg/dL — ABNORMAL HIGH (ref 65–99)
Glucose-Capillary: 170 mg/dL — ABNORMAL HIGH (ref 65–99)
Glucose-Capillary: 263 mg/dL — ABNORMAL HIGH (ref 65–99)
Glucose-Capillary: 144 mg/dL — ABNORMAL HIGH (ref 65–99)
Glucose-Capillary: 337 mg/dL — ABNORMAL HIGH (ref 65–99)

## 2015-02-23 LAB — BASIC METABOLIC PANEL
ANION GAP: 6 (ref 5–15)
ANION GAP: 7 (ref 5–15)
Anion gap: 6 (ref 5–15)
BUN: 10 mg/dL (ref 6–20)
BUN: 9 mg/dL (ref 6–20)
BUN: 7 mg/dL (ref 6–20)
CALCIUM: 8.4 mg/dL — AB (ref 8.9–10.3)
CALCIUM: 7.8 mg/dL — AB (ref 8.9–10.3)
CHLORIDE: 112 mmol/L — AB (ref 101–111)
CO2: 19 mmol/L — ABNORMAL LOW (ref 22–32)
CO2: 19 mmol/L — ABNORMAL LOW (ref 22–32)
CO2: 19 mmol/L — ABNORMAL LOW (ref 22–32)
CREATININE: 0.49 mg/dL (ref 0.44–1.00)
Calcium: 8.4 mg/dL — ABNORMAL LOW (ref 8.9–10.3)
Chloride: 111 mmol/L (ref 101–111)
Chloride: 112 mmol/L — ABNORMAL HIGH (ref 101–111)
Creatinine, Ser: 0.65 mg/dL (ref 0.44–1.00)
Creatinine, Ser: 0.59 mg/dL (ref 0.44–1.00)
GFR calc Af Amer: >60 mL/min (ref >60)
GFR calc non Af Amer: >60 mL/min (ref >60)
GLUCOSE: 150 mg/dL — AB (ref 65–99)
GLUCOSE: 152 mg/dL — AB (ref 65–99)
Glucose, Bld: 162 mg/dL — ABNORMAL HIGH (ref 65–99)
POTASSIUM: 3.3 mmol/L — AB (ref 3.5–5.1)
Potassium: 3.2 mmol/L — ABNORMAL LOW (ref 3.5–5.1)
Potassium: 3.3 mmol/L — ABNORMAL LOW (ref 3.5–5.1)
SODIUM: 136 mmol/L (ref 135–145)
SODIUM: 138 mmol/L (ref 135–145)
SODIUM: 137 mmol/L (ref 135–145)

## 2015-02-23 LAB — HEMOGLOBIN A1C
HEMOGLOBIN A1C: 11.9 % — AB (ref 4.8–5.6)
Mean Plasma Glucose: 295 mg/dL

## 2015-02-23 LAB — MRSA PCR SCREENING: MRSA BY PCR: NEGATIVE

## 2015-02-23 MED ORDER — SODIUM CHLORIDE 0.45 % IV SOLN
INTRAVENOUS | Status: DC
Start: 2015-02-23 — End: 2015-02-24
  Administered 2015-02-23: 125 mL/h via INTRAVENOUS
  Administered 2015-02-23 – 2015-02-24 (×2): via INTRAVENOUS

## 2015-02-23 MED ORDER — INSULIN ASPART 100 UNIT/ML ~~LOC~~ SOLN
0.0000 [IU] | Freq: Three times a day (TID) | SUBCUTANEOUS | Status: DC
  Administered 2015-02-23: 6 [IU] via SUBCUTANEOUS

## 2015-02-23 MED ORDER — INSULIN ASPART 100 UNIT/ML ~~LOC~~ SOLN
0.0000 [IU] | SUBCUTANEOUS | Status: DC
  Administered 2015-02-23: 5 [IU] via SUBCUTANEOUS
  Administered 2015-02-23: 7 [IU] via SUBCUTANEOUS

## 2015-02-23 MED ORDER — INSULIN GLARGINE 100 UNIT/ML ~~LOC~~ SOLN
30.0000 [IU] | SUBCUTANEOUS | Status: DC
  Administered 2015-02-23 – 2015-02-24 (×2): 30 [IU] via SUBCUTANEOUS
  Filled 2015-02-23 (×4): qty 0.3

## 2015-02-23 MED ORDER — INSULIN ASPART 100 UNIT/ML ~~LOC~~ SOLN: 6.0000 [IU] | Freq: Three times a day (TID) | SUBCUTANEOUS | Status: DC

## 2015-02-23 MED ORDER — PROMETHAZINE HCL 25 MG/ML IJ SOLN
12.5000 mg | Freq: Four times a day (QID) | INTRAMUSCULAR | Status: DC | PRN
  Administered 2015-02-23: 12.5 mg via INTRAVENOUS
  Filled 2015-02-23: qty 1

## 2015-02-23 MED ORDER — METOCLOPRAMIDE HCL 5 MG/ML IJ SOLN
5.0000 mg | Freq: Four times a day (QID) | INTRAMUSCULAR | Status: DC
  Administered 2015-02-23 – 2015-02-24 (×3): 5 mg via INTRAVENOUS
  Filled 2015-02-23 (×3): qty 2

## 2015-02-23 MED ORDER — POTASSIUM CHLORIDE CRYS ER 20 MEQ PO TBCR
40.0000 meq | EXTENDED_RELEASE_TABLET | Freq: Once | ORAL | Status: AC
  Administered 2015-02-23: 40 meq via ORAL

## 2015-02-23 MED ORDER — POTASSIUM CHLORIDE CRYS ER 20 MEQ PO TBCR
40.0000 meq | EXTENDED_RELEASE_TABLET | Freq: Once | ORAL | Status: AC
  Administered 2015-02-23: 40 meq via ORAL
  Filled 2015-02-23: qty 2

## 2015-02-23 NOTE — Progress Notes (Signed)
[facility] PROGRESS NOTE  Linda Walls ZOX:[id number] DOB: [birth year] DOA: 02/22/2015 PCP: Linda Hillier, MD  Assessment/Plan: 1. Diabetic ketoacidosis, type I. Patient was admitted to the step down unit and started on aggressive hydration and IV insulin per DKA protocol yesterday. Anion gap is closed at 7 today and CBG is 165 serum bicarbonate is 19. Will continue to follow serial chemistries. If bicarbonate continues to improve will transition the patient to subcuntaneous insulin. 2. GERD. Will continue PPI 3. Dehydration. Will continue on IV fluids 4. DM type 1. Uncontrolled. Hemoglobin A1C 11.9 on 8/25. Management as above. Follow up with endocrinologist as previously scheduled  5. Hereditary pancreatitis. Lipase in normal range. Continue to follow and treat supportively 6. Nausea and vomiting. Likely related to DKA. Improving with Zofran. Urinalysis unremarkable.  7. Hypokalemia. Potassium trending downwards at 3.2 today. Will replete potassium and recheck.  Code Status: full code DVT Prophylaxis: lovenox Family Communication: discussed with patient and mother at the bedside Disposition Plan: discharge home once improved   Consultants:    Procedures:    Antibiotics:   HPI/Subjective: Pt is feeling better today. Denies difficulty urinating, nausea or vomiting. Endorses baseline abdominal pain. No BM today.  Objective: Filed Vitals:   02/23/15 0500  BP: 93/50  Pulse: 86  Temp:   Resp: 13    Intake/Output Summary (Last 24 hours) at 02/23/15 0717 Last data filed at 02/23/15 0200  Gross per 24 hour  Intake 2670.96 ml  Output      0 ml  Net 2670.96 ml   Filed Weights   02/22/15 1149 02/22/15 1156  Weight: 47.628 kg (105 lb) 47.628 kg (105 lb)    Exam:  General:  Appears comfortable, calm. Eyes: PERRL, normal lids, irises ENT: grossly normal hearing, lips, tongue Neck: no LAD, masses, thyromegaly Cardiovascular: Regular rate and rhythm, no murmur,  rub or gallop. No lower extremity edema. Telemetry: Sinus rhythm, no arrhythmias  Respiratory: Clear to auscultation bilaterally, no wheezes, rales or rhonchi. Normal respiratory effort. Abdomen: soft, ntnd Skin: no rash or induration  Musculoskeletal: grossly normal tone bilateral upper and lower extremities Psychiatric: grossly normal mood and affect, speech fluent and appropriate Neurologic: grossly non-focal.   Data Reviewed: Basic Metabolic Panel:  Recent Labs Lab 02/22/15 1336 02/22/15 2054 02/23/15 0042 02/23/15 0427  NA 141 137 136 138  K 4.1 3.8 3.3* 3.2*  CL 109 112* 111 112*  CO2 11* 13* 19* 19*  GLUCOSE 282* 170* 150* 152*  BUN 16 11 10 9   CREATININE 1.06* 0.76 0.65 0.59  CALCIUM 8.9 8.2* 8.4* 8.4*   Liver Function Tests:  Recent Labs Lab 02/22/15 1220  AST 32  ALT 22  ALKPHOS 134*  BILITOT 1.6*  PROT 9.1*  ALBUMIN 5.4*    Recent Labs Lab 02/22/15 1220  LIPASE 13*   No results for input(s): AMMONIA in the last 168 hours. CBC:  Recent Labs Lab 02/22/15 1220  WBC 29.3*  HGB 15.0  HCT 44.3  MCV 91.7  PLT 537*   Cardiac Enzymes: No results for input(s): CKTOTAL, CKMB, CKMBINDEX, TROPONINI in the last 168 hours. BNP (last 3 results) No results for input(s): BNP in the last 8760 hours.  ProBNP (last 3 results) No results for input(s): PROBNP in the last 8760 hours.  CBG:  Recent Labs Lab 02/23/15 0233 02/23/15 0340 02/23/15 0434 02/23/15 0539 02/23/15 0638  GLUCAP 161* 155* 151* 153* 165*    No results found for this or any previous visit (from the  past 240 hour(s)).   Studies: No results found.  Scheduled Meds: . enoxaparin (LOVENOX) injection  40 mg Subcutaneous Q24H  . Influenza vac split quadrivalent PF  0.5 mL Intramuscular Tomorrow-1000  . lipase/protease/amylase  1 capsule Oral TID AC  . pantoprazole (PROTONIX) IV  40 mg Intravenous Q24H   Continuous Infusions: . sodium chloride    . dextrose 5 % and 0.45% NaCl  125 mL/hr at 02/23/15 0049  . insulin (NOVOLIN-R) infusion 1 mL/hr at 02/22/15 2000    Principal Problem:   DKA (diabetic ketoacidoses) Active Problems:   Hereditary pancreatitis   DM type 1 (diabetes mellitus, type 1)   Dehydration   Nausea and vomiting   DKA, type 1   Diabetic ketoacidosis without coma associated with type 1 diabetes mellitus    Time spent: 25 minutes   Linda Walls, M.D. [facility] Pager [phone]. If 7PM-7AM, please contact night-coverage at [email or web address], password [facility] 02/23/2015, 7:17 AM  LOS: 1 day       I, Linda Walls, acting a scribe, recorded this note contemporaneously in the presence of Dr. Kathie Walls, M.D. on 02/23/2015 at 7:18 AM   I have reviewed the above documentation for accuracy and completeness, and I agree with the above.  Walls,Linda

## 2015-02-23 NOTE — Progress Notes (Signed)
Inpatient Diabetes Program Recommendations  AACE/ADA: New Consensus Statement on Inpatient Glycemic Control (2013)  Target Ranges:  Prepandial:   less than 140 mg/dL      Peak postprandial:   less than 180 mg/dL (1-2 hours)      Critically ill patients:  140 - 180 mg/dL   Results for Linda Walls, Linda Walls (MRN [id number]) as of 02/23/2015 07:44  Ref. Range 02/23/2015 00:47 02/23/2015 02:33 02/23/2015 03:40 02/23/2015 04:34 02/23/2015 05:39 02/23/2015 06:38  Glucose-Capillary Latest Ref Range: 65-99 mg/dL 142 (H) 161 (H) 155 (H) 151 (H) 153 (H) 165 (H)   Diabetes history: DM1 Outpatient Diabetes medications: Toujeo 30 units QPM, Novolog 8 units TID with meals plus 1 unit for every 50 mg/dl above target of 150 mg/dl Current orders for Inpatient glycemic control: Novolin R insulin drip per DKA protocol  Inpatient Diabetes Program Recommendations Insulin - Basal: Once patient meets criteria to transition off IV insulin per DKA protocol, recommend ordering Lantus 30 units Q24H. Correction (SSI): Once patient meets criteria to transition off IV insulin per DKA protocol, recommend ordering Novolog sensitive correction scale Q4H. Insulin - Meal Coverage: Once patient meets criteria to transition off IV insulin per DKA protocol and diet is resumed, recommend ordering Novolog 0-6 units (1 unit for every 10 grams of carbs) TID with meals for meal coverage (if patient eats at least 50% of meal).  Thanks, Barnie Alderman, RN, MSN, CCRN, CDE Diabetes Coordinator Inpatient Diabetes Program [phone] (Team Pager from [place] to [place]) [phone] ([facility] office) [phone] [facility] office) [phone] [facility] office)

## 2015-02-23 NOTE — Care Management Note (Signed)
Case Management Note  Patient Details  Name: Linda Walls MRN: [id number] Date of Birth: [birth year]  Expected Discharge Date:  02/24/15               Expected Discharge Plan:  Home/Self Care  In-House Referral:  NA  Discharge planning Services  CM Consult  Post Acute Care Choice:  NA Choice offered to:  NA  DME Arranged:    DME Agency:     HH Arranged:    [place] Agency:     Status of Service:  Completed, signed off  Medicare Important Message Given:    Date Medicare IM Given:    Medicare IM give by:    Date Additional Medicare IM Given:    Additional Medicare Important Message give by:     If discussed at [place] of Stay Meetings, dates discussed:    Additional Comments: Pt is from home, lives with her parents and is independent with self care. Pt plans to discharge home with self care. No CM needs.  Sherald Barge, RN 02/23/2015, 11:01 AM

## 2015-02-23 NOTE — Progress Notes (Signed)
Report given to Oley Balm, RN for the patient going to 312.

## 2015-02-24 LAB — BASIC METABOLIC PANEL
Anion gap: 8 (ref 5–15)
CALCIUM: 8.3 mg/dL — AB (ref 8.9–10.3)
CO2: 23 mmol/L (ref 22–32)
CREATININE: 0.31 mg/dL — AB (ref 0.44–1.00)
Chloride: 111 mmol/L (ref 101–111)
GFR calc Af Amer: >60 mL/min (ref >60)
GLUCOSE: 91 mg/dL (ref 65–99)
Potassium: 3.3 mmol/L — ABNORMAL LOW (ref 3.5–5.1)
Sodium: 142 mmol/L (ref 135–145)

## 2015-02-24 LAB — CBC
HCT: 34.3 % — ABNORMAL LOW (ref 36.0–46.0)
HEMOGLOBIN: 11.5 g/dL — AB (ref 12.0–15.0)
MCH: 30 pg (ref 26.0–34.0)
MCHC: 33.5 g/dL (ref 30.0–36.0)
MCV: 89.6 fL (ref 78.0–100.0)
PLATELETS: 255 10*3/uL (ref 150–400)
RBC: 3.83 MIL/uL — ABNORMAL LOW (ref 3.87–5.11)
RDW: 12.8 % (ref 11.5–15.5)
WBC: 7.7 10*3/uL (ref 4.0–10.5)

## 2015-02-24 LAB — GLUCOSE, CAPILLARY
GLUCOSE-CAPILLARY: 69 mg/dL (ref 65–99)
GLUCOSE-CAPILLARY: 98 mg/dL (ref 65–99)
Glucose-Capillary: 111 mg/dL — ABNORMAL HIGH (ref 65–99)
Glucose-Capillary: 71 mg/dL (ref 65–99)

## 2015-02-24 LAB — PREGNANCY, URINE: Preg Test, Ur: NEGATIVE

## 2015-02-24 MED ORDER — METOCLOPRAMIDE HCL 5 MG PO TABS: 5.0000 mg | ORAL_TABLET | Freq: Three times a day (TID) | ORAL | Status: DC | PRN

## 2015-02-24 MED ORDER — POTASSIUM CHLORIDE CRYS ER 20 MEQ PO TBCR
40.0000 meq | EXTENDED_RELEASE_TABLET | Freq: Once | ORAL | Status: AC
  Administered 2015-02-24: 40 meq via ORAL
  Filled 2015-02-24: qty 2

## 2015-02-24 MED ORDER — PROMETHAZINE HCL 25 MG RE SUPP: 25.0000 mg | Freq: Four times a day (QID) | RECTAL | Status: DC | PRN

## 2015-02-24 NOTE — Discharge Summary (Signed)
Physician Discharge Summary  Linda Walls QIO:[id number] DOB: Sep 07, 1990 DOA: 02/22/2015  PCP: [name], MD  Admit date: 02/22/2015 Discharge date: 02/24/2015  Time spent: 30 minutes  Recommendations for Outpatient Follow-up:  1. Continue home insulin regimen.  2. Follow up with PCP within 1 weeks. 3. Follow up with endocrinologist next week as previously scheduled.   Discharge Diagnoses:  Principal Problem:   DKA (diabetic ketoacidoses) Active Problems:   Hereditary pancreatitis   DM type 1 (diabetes mellitus, type 1)   Dehydration   Nausea and vomiting   DKA, type 1   Diabetic ketoacidosis without coma associated with type 1 diabetes mellitus   Discharge Condition: Improved  Diet recommendation: Low carb  Filed Weights   02/22/15 1149 02/22/15 1156  Weight: 47.628 kg (105 lb) 47.628 kg (105 lb)    History of present illness:  24 y.o. female with a history of hereditary chronic pancreatitis, type 1 diabetes presents with nausea, vomiting and chronic abdominal pain. While in the ED, she was noted to have a serum bicarbonate of 11 and an anion gap of 21.She was found to have diabetic ketoacidosis and admitted to the hospital for further treatments.  Hospital Course:  Diabetic ketoacidosis, type I. She was started on aggressive hydration and IV insulin per DKA protocol yesterday. Anion gap is closed at 8 currently and CBG is 71 serum bicarbonate WNL. Patient was transitioned to subcutaneous insulin and blood sugars remained stable. She is tolerating a solid diet. Will plan on discharge later today.   1. GERD. Will continue PPI 2. Dehydration. Resolved with IVF 3. DM type 1. Uncontrolled. Hemoglobin A1C 11.9 on 8/25. Management as above. Follow up with endocrinologist as previously scheduled  4. Hereditary pancreatitis. Lipase in normal range.  5. Nausea and vomiting. 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 Improving with antiemetics/regland.  Urinalysis unremarkable.  6. Hypokalemia. Improved with supplementation.   Procedures:    Consultations:    Discharge Exam: Filed Vitals:   02/24/15 0445  BP: 97/55  Pulse: 65  Temp: 98.2 F (36.8 C)  Resp: 16    General: Appears calm and comfortable  Cardiovascular: RRR, no m/r/g. No LE edema.  Respiratory: CTA bilaterally, no w/r/r. Normal respiratory effort.  Musculoskeletal: grossly normal tone BUE/BLE  Psychiatric: grossly normal mood and affect, speech fluent and appropriate  Neurologic: grossly non-focal. Discharge Instructions    Current Discharge Medication List    CONTINUE these medications which have NOT CHANGED   Details  ferrous sulfate 325 (65 FE) MG tablet Take 325 mg by mouth daily with breakfast.    hyoscyamine (LEVSIN SL) 0.125 MG SL tablet PLACE 1 TABLET UNDER THE TONGUE EVERY 4 HOURS AS NEEDED FOR PAIN. Qty: 30 tablet, Refills: 11    ibuprofen (ADVIL,MOTRIN) 200 MG tablet Take 400 mg by mouth every 6 (six) hours as needed. pain    insulin aspart (NOVOLOG) 100 UNIT/ML injection Inject 6 Units into the skin 3 (three) times daily before meals. *Increase subcutaneous injection by 1 unit for every 50 units over 150.* Qty: 1 vial, Refills: 12    Insulin Glargine (TOUJEO SOLOSTAR) 300 UNIT/ML SOPN Inject 30 Units into the skin every evening.    Multiple Vitamins-Minerals (MULTIVITAMINS THER. W/MINERALS) TABS Take 1 tablet by mouth daily. For energy    omeprazole (PRILOSEC) 20 MG capsule Take 1 capsule (20 mg total) by mouth daily. Qty: 30 capsule, Refills: 5    ondansetron (ZOFRAN-ODT) 4 MG disintegrating tablet DISSOLVE 1 TABLET  BY MOUTH EVERY 8 HOURS AS NEEDED FOR NAUSEA OR VOMITING. Qty: 20 tablet, Refills: 5    oxyCODONE-acetaminophen (PERCOCET) 7.5-325 MG per tablet Take one tablet up to BID prn pain Qty: 60 tablet, Refills: 0    Pancrelipase, Lip-Prot-Amyl, 6000 UNITS CPEP Take 2 capsules (12,000 Units total) by mouth 3 (three) times  daily before meals. Qty: 180 capsule, Refills: 5    promethazine (PHENERGAN) 25 MG suppository Place 25 mg rectally every 6 (six) hours as needed for nausea or vomiting.    promethazine (PHENERGAN) 25 MG tablet TAKE 1 TABLET BY MOUTH EVERY 6 HOURS AS NEEDED FOR NAUSEA. Qty: 30 tablet, Refills: 5    SUMAtriptan (IMITREX) 50 MG tablet TAKE 1 TABLET BY MOUTH ONCE. MAY REPEAT IN 2 HOURS IF HEADACHE PERSISTS OR RECURS. Qty: 6 tablet, Refills: 2    vitamin C (ASCORBIC ACID) 500 MG tablet Take 500 mg by mouth daily.       Allergies  Allergen Reactions  . Cefzil [Cefprozil]     Sickness  . Shellfish Allergy Nausea And Vomiting  . Penicillins Rash  . Sulfa Antibiotics Rash      The results of significant diagnostics from this hospitalization (including imaging, microbiology, ancillary and laboratory) are listed below for reference.    Significant Diagnostic Studies: No results found.  Microbiology: Recent Results (from the past 240 hour(s))  MRSA PCR Screening     Status: None   Collection Time: 02/23/15  6:42 PM  Result Value Ref Range Status   MRSA by PCR NEGATIVE NEGATIVE Final    Comment:        The GeneXpert MRSA Assay (FDA approved for NASAL specimens only), is one component of a comprehensive MRSA colonization surveillance program. It is not intended to diagnose MRSA infection nor to guide or monitor treatment for MRSA infections.      Labs: Basic Metabolic Panel:  Recent Labs Lab 02/22/15 2054 02/23/15 0042 02/23/15 0427 02/23/15 0922 02/24/15 0618  NA 137 136 138 137 142  K 3.8 3.3* 3.2* 3.3* 3.3*  CL 112* 111 112* 112* 111  CO2 13* 19* 19* 19* 23  GLUCOSE 170* 150* 152* 162* 91  BUN 11 10 9 7  <5*  CREATININE 0.76 0.65 0.59 0.49 0.31*  CALCIUM 8.2* 8.4* 8.4* 7.8* 8.3*   Liver Function Tests:  Recent Labs Lab 02/22/15 1220  AST 32  ALT 22  ALKPHOS 134*  BILITOT 1.6*  PROT 9.1*  ALBUMIN 5.4*    Recent Labs Lab 02/22/15 1220  LIPASE  13*   No results for input(s): AMMONIA in the last 168 hours. CBC:  Recent Labs Lab 02/22/15 1220 02/24/15 0618  WBC 29.3* 7.7  HGB 15.0 11.5*  HCT 44.3 34.3*  MCV 91.7 89.6  PLT 537* 255   Cardiac Enzymes: No results for input(s): CKTOTAL, CKMB, CKMBINDEX, TROPONINI in the last 168 hours. BNP: BNP (last 3 results) No results for input(s): BNP in the last 8760 hours.  ProBNP (last 3 results) No results for input(s): PROBNP in the last 8760 hours.  CBG:  Recent Labs Lab 02/23/15 1940 02/24/15 0012 02/24/15 0119 02/24/15 0447 02/24/15 0726  GLUCAP 266* 69 111* 98 71       Signed:  Kathie Dike, M.D.   [facility] 02/24/2015, 10:07 AM   I have reviewed the above documentation for accuracy and completeness, and I agree with the above.  MEMON,JEHANZEB

## 2015-02-24 NOTE — Progress Notes (Signed)
Patient states understanding of discharge instructions, prescriptions given 

## 2015-03-02 ENCOUNTER — Other Ambulatory Visit: Payer: Self-pay | Admitting: Family Medicine

## 2015-03-31 ENCOUNTER — Other Ambulatory Visit: Payer: Self-pay | Admitting: "Endocrinology

## 2015-04-16 ENCOUNTER — Encounter (HOSPITAL_COMMUNITY): Payer: Self-pay | Admitting: Emergency Medicine

## 2015-04-16 ENCOUNTER — Inpatient Hospital Stay (HOSPITAL_COMMUNITY)
Admission: EM | Admit: 2015-04-16 | Discharge: 2015-04-17 | DRG: 638 | Disposition: A | Discharge status: Home / Self Care | Payer: BC Managed Care – PPO | Attending: Internal Medicine | Admitting: Internal Medicine

## 2015-04-16 DIAGNOSIS — R112 Nausea with vomiting, unspecified: Secondary | ICD-10-CM | POA: Diagnosis not present

## 2015-04-16 DIAGNOSIS — E1010 Type 1 diabetes mellitus with ketoacidosis without coma: Secondary | ICD-10-CM | POA: Diagnosis present

## 2015-04-16 DIAGNOSIS — E876 Hypokalemia: Secondary | ICD-10-CM | POA: Diagnosis present

## 2015-04-16 DIAGNOSIS — Z803 Family history of malignant neoplasm of breast: Secondary | ICD-10-CM

## 2015-04-16 DIAGNOSIS — R1110 Vomiting, unspecified: Secondary | ICD-10-CM

## 2015-04-16 DIAGNOSIS — N179 Acute kidney failure, unspecified: Secondary | ICD-10-CM | POA: Diagnosis present

## 2015-04-16 DIAGNOSIS — Z8249 Family history of ischemic heart disease and other diseases of the circulatory system: Secondary | ICD-10-CM | POA: Diagnosis not present

## 2015-04-16 DIAGNOSIS — Z794 Long term (current) use of insulin: Secondary | ICD-10-CM

## 2015-04-16 DIAGNOSIS — K859 Acute pancreatitis without necrosis or infection, unspecified: Secondary | ICD-10-CM | POA: Diagnosis present

## 2015-04-16 DIAGNOSIS — D72829 Elevated white blood cell count, unspecified: Secondary | ICD-10-CM | POA: Diagnosis present

## 2015-04-16 DIAGNOSIS — R1032 Left lower quadrant pain: Secondary | ICD-10-CM | POA: Diagnosis present

## 2015-04-16 DIAGNOSIS — Z91013 Allergy to seafood: Secondary | ICD-10-CM | POA: Diagnosis not present

## 2015-04-16 DIAGNOSIS — K219 Gastro-esophageal reflux disease without esophagitis: Secondary | ICD-10-CM | POA: Diagnosis present

## 2015-04-16 DIAGNOSIS — E1110 Type 2 diabetes mellitus with ketoacidosis without coma: Secondary | ICD-10-CM | POA: Diagnosis present

## 2015-04-16 LAB — BASIC METABOLIC PANEL
Anion gap: 4 — ABNORMAL LOW (ref 5–15)
Anion gap: 7 (ref 5–15)
Anion gap: 5 (ref 5–15)
BUN: 22 mg/dL — AB (ref 6–20)
BUN: 20 mg/dL (ref 6–20)
BUN: 17 mg/dL (ref 6–20)
CALCIUM: 9.4 mg/dL (ref 8.9–10.3)
CHLORIDE: 112 mmol/L — AB (ref 101–111)
CHLORIDE: 110 mmol/L (ref 101–111)
CO2: 18 mmol/L — AB (ref 22–32)
CO2: 20 mmol/L — AB (ref 22–32)
CO2: 21 mmol/L — ABNORMAL LOW (ref 22–32)
CREATININE: 0.5 mg/dL (ref 0.44–1.00)
Calcium: 9.2 mg/dL (ref 8.9–10.3)
Calcium: 9.7 mg/dL (ref 8.9–10.3)
Chloride: 112 mmol/L — ABNORMAL HIGH (ref 101–111)
Creatinine, Ser: 0.61 mg/dL (ref 0.44–1.00)
Creatinine, Ser: 0.53 mg/dL (ref 0.44–1.00)
GFR calc Af Amer: >60 mL/min (ref >60)
GFR calc Af Amer: >60 mL/min (ref >60)
GFR calc non Af Amer: >60 mL/min (ref >60)
GFR calc non Af Amer: >60 mL/min (ref >60)
GFR calc non Af Amer: >60 mL/min (ref >60)
GLUCOSE: 212 mg/dL — AB (ref 65–99)
GLUCOSE: 99 mg/dL (ref 65–99)
Glucose, Bld: 99 mg/dL (ref 65–99)
POTASSIUM: 3.9 mmol/L (ref 3.5–5.1)
Potassium: 4.3 mmol/L (ref 3.5–5.1)
Potassium: 3.2 mmol/L — ABNORMAL LOW (ref 3.5–5.1)
SODIUM: 138 mmol/L (ref 135–145)
Sodium: 134 mmol/L — ABNORMAL LOW (ref 135–145)
Sodium: 137 mmol/L (ref 135–145)

## 2015-04-16 LAB — COMPREHENSIVE METABOLIC PANEL
ALK PHOS: 124 U/L (ref 38–126)
ALT: 23 U/L (ref 14–54)
AST: 28 U/L (ref 15–41)
Albumin: 5.5 g/dL — ABNORMAL HIGH (ref 3.5–5.0)
Anion gap: 18 — ABNORMAL HIGH (ref 5–15)
BUN: 27 mg/dL — AB (ref 6–20)
CALCIUM: 11.5 mg/dL — AB (ref 8.9–10.3)
CO2: 15 mmol/L — AB (ref 22–32)
CREATININE: 0.9 mg/dL (ref 0.44–1.00)
Chloride: 98 mmol/L — ABNORMAL LOW (ref 101–111)
GFR calc non Af Amer: >60 mL/min (ref >60)
Glucose, Bld: 358 mg/dL — ABNORMAL HIGH (ref 65–99)
Potassium: 4.6 mmol/L (ref 3.5–5.1)
SODIUM: 131 mmol/L — AB (ref 135–145)
Total Bilirubin: 1.5 mg/dL — ABNORMAL HIGH (ref 0.3–1.2)
Total Protein: 9.3 g/dL — ABNORMAL HIGH (ref 6.5–8.1)

## 2015-04-16 LAB — GLUCOSE, CAPILLARY
GLUCOSE-CAPILLARY: 244 mg/dL — AB (ref 65–99)
GLUCOSE-CAPILLARY: 134 mg/dL — AB (ref 65–99)
GLUCOSE-CAPILLARY: 104 mg/dL — AB (ref 65–99)
GLUCOSE-CAPILLARY: 262 mg/dL — AB (ref 65–99)
Glucose-Capillary: 182 mg/dL — ABNORMAL HIGH (ref 65–99)
Glucose-Capillary: 199 mg/dL — ABNORMAL HIGH (ref 65–99)
Glucose-Capillary: 85 mg/dL (ref 65–99)
Glucose-Capillary: 91 mg/dL (ref 65–99)
Glucose-Capillary: 99 mg/dL (ref 65–99)

## 2015-04-16 LAB — BLOOD GAS, VENOUS
Acid-base deficit: 10.4 mmol/L — ABNORMAL HIGH (ref 0.0–2.0)
BICARBONATE: 15.7 meq/L — AB (ref 20.0–24.0)
FIO2: 0.21
O2 CONTENT: 21 L/min
O2 Saturation: 64.9 %
PCO2 VEN: 33.2 mmHg — AB (ref 45.0–50.0)
PH VEN: 7.278 (ref 7.250–7.300)
PO2 VEN: 38.8 mmHg (ref 30.0–45.0)

## 2015-04-16 LAB — URINE MICROSCOPIC-ADD ON

## 2015-04-16 LAB — URINALYSIS, ROUTINE W REFLEX MICROSCOPIC
GLUCOSE, UA: 500 mg/dL — AB
Nitrite: NEGATIVE
PROTEIN: 100 mg/dL — AB
Specific Gravity, Urine: >1.03 — ABNORMAL HIGH (ref 1.005–1.030)
Urobilinogen, UA: 0.2 mg/dL (ref 0.0–1.0)
pH: 6.5 (ref 5.0–8.0)

## 2015-04-16 LAB — CBC
HCT: 45.8 % (ref 36.0–46.0)
Hemoglobin: 15.9 g/dL — ABNORMAL HIGH (ref 12.0–15.0)
MCH: 31.7 pg (ref 26.0–34.0)
MCHC: 34.7 g/dL (ref 30.0–36.0)
MCV: 91.4 fL (ref 78.0–100.0)
PLATELETS: 472 10*3/uL — AB (ref 150–400)
RBC: 5.01 MIL/uL (ref 3.87–5.11)
RDW: 12.3 % (ref 11.5–15.5)
WBC: 18.1 10*3/uL — ABNORMAL HIGH (ref 4.0–10.5)

## 2015-04-16 LAB — LIPASE, BLOOD: Lipase: 15 U/L — ABNORMAL LOW (ref 22–51)

## 2015-04-16 LAB — CBG MONITORING, ED
GLUCOSE-CAPILLARY: 333 mg/dL — AB (ref 65–99)
Glucose-Capillary: 335 mg/dL — ABNORMAL HIGH (ref 65–99)

## 2015-04-16 LAB — ELECTROLYTE PANEL
Anion gap: 16 — ABNORMAL HIGH (ref 5–15)
CHLORIDE: 106 mmol/L (ref 101–111)
CO2: 13 mmol/L — ABNORMAL LOW (ref 22–32)
POTASSIUM: 4.2 mmol/L (ref 3.5–5.1)
Sodium: 135 mmol/L (ref 135–145)

## 2015-04-16 LAB — PREGNANCY, URINE: Preg Test, Ur: NEGATIVE

## 2015-04-16 LAB — MRSA PCR SCREENING: MRSA BY PCR: NEGATIVE

## 2015-04-16 LAB — BETA-HYDROXYBUTYRIC ACID: Beta-Hydroxybutyric Acid: 7.16 mmol/L — ABNORMAL HIGH (ref 0.05–0.27)

## 2015-04-16 MED ORDER — SODIUM CHLORIDE 0.9 % IV BOLUS (SEPSIS)
2000.0000 mL | Freq: Once | INTRAVENOUS | Status: AC
  Administered 2015-04-16: 2000 mL via INTRAVENOUS

## 2015-04-16 MED ORDER — PROMETHAZINE HCL 25 MG/ML IJ SOLN: 12.5000 mg | INTRAMUSCULAR | Status: DC | PRN

## 2015-04-16 MED ORDER — CHLORHEXIDINE GLUCONATE 0.12 % MT SOLN
15.0000 mL | Freq: Two times a day (BID) | OROMUCOSAL | Status: DC
  Administered 2015-04-16: 15 mL via OROMUCOSAL

## 2015-04-16 MED ORDER — INSULIN ASPART 100 UNIT/ML ~~LOC~~ SOLN
0.0000 [IU] | SUBCUTANEOUS | Status: DC
  Administered 2015-04-16 – 2015-04-17 (×2): 8 [IU] via SUBCUTANEOUS
  Administered 2015-04-17: 5 [IU] via SUBCUTANEOUS

## 2015-04-16 MED ORDER — HYDROMORPHONE HCL 1 MG/ML IJ SOLN
1.0000 mg | Freq: Once | INTRAMUSCULAR | Status: AC
  Administered 2015-04-16: 1 mg via INTRAVENOUS
  Filled 2015-04-16: qty 1

## 2015-04-16 MED ORDER — POTASSIUM CHLORIDE 10 MEQ/100ML IV SOLN
10.0000 meq | INTRAVENOUS | Status: AC
  Administered 2015-04-16 (×2): 10 meq via INTRAVENOUS
  Filled 2015-04-16: qty 100

## 2015-04-16 MED ORDER — INSULIN GLARGINE 100 UNIT/ML ~~LOC~~ SOLN
15.0000 [IU] | Freq: Once | SUBCUTANEOUS | Status: AC
  Administered 2015-04-16: 15 [IU] via SUBCUTANEOUS
  Filled 2015-04-16: qty 0.15

## 2015-04-16 MED ORDER — CETYLPYRIDINIUM CHLORIDE 0.05 % MT LIQD
7.0000 mL | Freq: Two times a day (BID) | OROMUCOSAL | Status: DC
  Administered 2015-04-16: 7 mL via OROMUCOSAL

## 2015-04-16 MED ORDER — DEXTROSE-NACL 5-0.45 % IV SOLN
INTRAVENOUS | Status: DC
  Administered 2015-04-16: 12:00:00 via INTRAVENOUS

## 2015-04-16 MED ORDER — ENOXAPARIN SODIUM 40 MG/0.4ML ~~LOC~~ SOLN
40.0000 mg | SUBCUTANEOUS | Status: DC
  Administered 2015-04-16 – 2015-04-17 (×2): 40 mg via SUBCUTANEOUS
  Filled 2015-04-16 (×2): qty 0.4

## 2015-04-16 MED ORDER — SODIUM CHLORIDE 0.9 % IV SOLN
INTRAVENOUS | Status: DC
  Administered 2015-04-16: 2.7 [IU]/h via INTRAVENOUS
  Filled 2015-04-16: qty 2.5

## 2015-04-16 MED ORDER — INSULIN GLARGINE 100 UNIT/ML ~~LOC~~ SOLN
SUBCUTANEOUS | Status: AC
  Filled 2015-04-16: qty 10

## 2015-04-16 MED ORDER — PROMETHAZINE HCL 25 MG/ML IJ SOLN
12.5000 mg | Freq: Once | INTRAMUSCULAR | Status: AC
  Administered 2015-04-16: 12.5 mg via INTRAVENOUS
  Filled 2015-04-16: qty 1

## 2015-04-16 MED ORDER — METOCLOPRAMIDE HCL 5 MG/ML IJ SOLN
5.0000 mg | Freq: Four times a day (QID) | INTRAMUSCULAR | Status: DC
  Administered 2015-04-16 – 2015-04-17 (×4): 5 mg via INTRAVENOUS
  Filled 2015-04-16 (×4): qty 2

## 2015-04-16 MED ORDER — SODIUM CHLORIDE 0.9 % IV SOLN: INTRAVENOUS | Status: DC

## 2015-04-16 MED ORDER — HYDROMORPHONE HCL 1 MG/ML IJ SOLN
0.5000 mg | INTRAMUSCULAR | Status: DC | PRN
Start: 2015-04-16 — End: 2015-04-16

## 2015-04-16 MED ORDER — PANTOPRAZOLE SODIUM 40 MG IV SOLR
40.0000 mg | INTRAVENOUS | Status: DC
  Administered 2015-04-16 – 2015-04-17 (×2): 40 mg via INTRAVENOUS
  Filled 2015-04-16 (×2): qty 40

## 2015-04-16 MED ORDER — SODIUM CHLORIDE 0.9 % IV SOLN
INTRAVENOUS | Status: DC
  Filled 2015-04-16: qty 2.5

## 2015-04-16 MED ORDER — DEXTROSE-NACL 5-0.45 % IV SOLN: INTRAVENOUS | Status: DC

## 2015-04-16 MED ORDER — MORPHINE SULFATE (PF) 2 MG/ML IV SOLN
2.0000 mg | INTRAVENOUS | Status: DC | PRN
  Administered 2015-04-16 – 2015-04-17 (×4): 2 mg via INTRAVENOUS
  Filled 2015-04-16 (×4): qty 1

## 2015-04-16 MED ORDER — SODIUM CHLORIDE 0.9 % IV SOLN
INTRAVENOUS | Status: DC
  Administered 2015-04-16: 12:00:00 via INTRAVENOUS

## 2015-04-16 MED ORDER — ACETAMINOPHEN 325 MG PO TABS: 650.0000 mg | ORAL_TABLET | Freq: Four times a day (QID) | ORAL | Status: DC | PRN

## 2015-04-16 MED ORDER — PANCRELIPASE (LIP-PROT-AMYL) 12000-38000 UNITS PO CPEP
12000.0000 [IU] | ORAL_CAPSULE | Freq: Three times a day (TID) | ORAL | Status: DC
Start: 2015-04-17 — End: 2015-04-17
  Administered 2015-04-17 (×2): 12000 [IU] via ORAL
  Filled 2015-04-16 (×2): qty 1

## 2015-04-16 MED ORDER — SODIUM CHLORIDE 0.9 % IV SOLN
Freq: Once | INTRAVENOUS | Status: AC
  Administered 2015-04-16: 11:00:00 via INTRAVENOUS

## 2015-04-16 NOTE — H&P (Signed)
[facility] History and Physical  [name] HCW:[id number] DOB: [birth year] DOA: 04/16/2015  Referring physician: ED MD Dr.  Alfonse Spruce PCP: [name], MD   Chief Complaint: Nausea, vomiting, abdominal pain.  HPI: Linda Walls is a 24 y.o. female  with a history of type 1 diabetes mellitus, previous hospitalizations for DKA, history of hereditary pancreatitis, who presents with a two-day history of nausea, vomiting, and left lower quadrant abdominal pain. Patient has had multiple episodes of vomiting over the past 24-48 hours. She denies bright red blood in her emesis. She denies coffee grounds emesis. Her abdominal pain is mostly on the left lower quadrant with no radiation. Nothing she has done has made the pain better or worse. Her last bowel movement was 2 days ago and considered normal. She denies diarrhea or bright red blood per rectum. She denies fever, chills, chest pain, chest congestion, cough, pain with urination, or swelling. She denies missing any doses of her insulin. Her blood sugars have been ranging from 250-350.   In the ED, she was noted to be afebrile and mildly tachycardic. Her blood pressure was within normal limits. Her lab data were significant for a capillary blood glucose of 335, sodium 131, CO2 of 15, anion gap of 18, total bilirubin of 1.5, white blood cell count of 18.1, hemoglobin of 15.9, and a negative pregnancy test. Her urinalysis revealed greater than 80 ketones, greater than 1.03 specific gravity, 3-6 WBCs.     Review of Systems:  Positive as above in history present illness; in addition, the patient has chronic abdominal pain, chronic GERD, and chronic headaches; otherwise review of system is negative.  Past Medical History  Diagnosis Date  . Hereditary pancreatitis   . DM type 1 (diabetes mellitus, type 1) ([place])   . Hernia   . GERD (gastroesophageal reflux disease)   . Jaundice    Past Surgical History  Procedure Laterality Date    . Hernia repair      As infant.  . Bile duct stent placement  2006 at [facility]  . Pancreatic cyst drainage  at age 22 year   Social History: Patient is single. She lives with her parents. She reports that she has never smoked. She has never used smokeless tobacco. She reports that she does not drink alcohol or use illicit drugs. She is employed in [employer] (medical).  Allergies  Allergen Reactions  . Cefzil [Cefprozil]     Sickness  . Shellfish Allergy Nausea And Vomiting  . Penicillins Rash    Has patient had a PCN reaction causing immediate rash, facial/tongue/throat swelling, SOB or lightheadedness with hypotension: NO Has patient had a PCN reaction causing severe rash involving mucus membranes or skin necrosis: NO Has patient had a PCN reaction that required hospitalization: NO Has patient had a PCN reaction occurring within the last 10 years: NO If all of the above answers are "NO", then may proceed with Cephalosporin use.   . Sulfa Antibiotics Rash    Family History  Problem Relation Age of Onset  . Pancreatitis Father 30    hereditary  . Hypertension Father   . GER disease Father   . Breast cancer Maternal Aunt   . Congenital heart disease Maternal Grandfather   . Prostate cancer Maternal Grandfather     Prior to Admission medications   Medication Sig Start Date End Date Taking? Authorizing Provider  ferrous sulfate 325 (65 FE) MG tablet Take 325 mg by mouth daily with breakfast.   Yes Historical  Provider, MD  GLUCAGON EMERGENCY 1 MG injection Inject 1 mg as directed once. 03/13/15  Yes Historical Provider, MD  hyoscyamine (LEVSIN SL) 0.125 MG SL tablet PLACE 1 TABLET UNDER THE TONGUE EVERY 4 HOURS AS NEEDED FOR PAIN. Patient taking differently: Take 0.125 mg by mouth every 4 (four) hours as needed. PLACE 1 TABLET UNDER THE TONGUE EVERY 4 HOURS AS NEEDED FOR PAIN. 08/04/14  Yes [name], MD  ibuprofen (ADVIL,MOTRIN) 200 MG tablet Take 400 mg by mouth every 6 (six) hours as  needed. pain   Yes Historical Provider, MD  insulin aspart (NOVOLOG) 100 UNIT/ML injection Inject 6 Units into the skin 3 (three) times daily before meals. *Increase subcutaneous injection by 1 unit for every 50 units over 150.* Patient taking differently: Inject 12 Units into the skin 3 (three) times daily before meals. *Increase subcutaneous injection by 1 unit for every 50 units over 150.* 04/02/13  Yes [name], MD  metoCLOPramide (REGLAN) 5 MG tablet Take 1 tablet (5 mg total) by mouth every 8 (eight) hours as needed for nausea. 02/24/15  Yes Kathie Dike, MD  Multiple Vitamins-Minerals (MULTIVITAMINS THER. W/MINERALS) TABS Take 1 tablet by mouth daily. For energy   Yes Historical Provider, MD  omeprazole (PRILOSEC) 20 MG capsule Take 1 capsule (20 mg total) by mouth daily. 01/31/15  Yes [name], MD  ondansetron (ZOFRAN-ODT) 4 MG disintegrating tablet DISSOLVE 1 TABLET BY MOUTH EVERY 8 HOURS AS NEEDED FOR NAUSEA OR VOMITING. 01/31/15  Yes [name], MD  oxyCODONE-acetaminophen (PERCOCET) 7.5-325 MG per tablet Take one tablet up to BID prn pain Patient taking differently: Take 1 tablet by mouth 2 (two) times daily as needed for moderate pain. Take one tablet up to BID prn pain 01/31/15  Yes [name], MD  Pancrelipase, Lip-Prot-Amyl, 6000 UNITS CPEP Take 2 capsules (12,000 Units total) by mouth 3 (three) times daily before meals. 01/31/15  Yes [name], MD  promethazine (PHENERGAN) 25 MG suppository Place 1 suppository (25 mg total) rectally every 6 (six) hours as needed for nausea or vomiting. 02/24/15  Yes Kathie Dike, MD  promethazine (PHENERGAN) 25 MG tablet TAKE 1 TABLET BY MOUTH EVERY 6 HOURS AS NEEDED FOR NAUSEA. 01/31/15  Yes [name], MD  SUMAtriptan (IMITREX) 50 MG tablet TAKE 1 TABLET BY MOUTH ONCE. MAY REPEAT IN 2 HOURS IF HEADACHE PERSISTS OR RECURS. 03/02/15  Yes [name], MD  TOUJEO SOLOSTAR 300 UNIT/ML SOPN INJECT 30 UNITS SUBCUTANEOUSLY  AT BEDTIME. 04/02/15  Yes [name], MD  vitamin C (ASCORBIC ACID) 500 MG tablet Take 500 mg by mouth daily.   Yes Historical Provider, MD   Physical Exam: Filed Vitals:   04/16/15 1000 04/16/15 1005 04/16/15 1025 04/16/15 1029  BP: 94/55  110/83 110/83  Pulse: 81 87 69 84  Temp:      TempSrc:      Resp: 17 20 17    Height:    5\' 2"  (1.575 m)  Weight:    47.5 kg (104 lb 11.5 oz)  SpO2: 100% 99% 100% 100%    Wt Readings from Last 3 Encounters:  04/16/15 47.5 kg (104 lb 11.5 oz)  02/22/15 47.628 kg (105 lb)  02/22/15 46.902 kg (103 lb 6.4 oz)    General:  Appears calm and comfortable; small framed 24 year old woman in no acute distress. Eyes: PERRL, normal lids, irises & conjunctiva; conjunctivae are clear and sclerae white. ENT: grossly normal hearing, lips &  tongue; oropharynx reveals mildly dry mucous membranes; no posterior exudates or erythema. Neck: no LAD, masses or thyromegaly Cardiovascular: RRR, no m/r/g. No LE edema. Telemetry: SR, no arrhythmias  Respiratory: CTA bilaterally, no w/r/r. Normal respiratory effort. Abdomen: Hypoactive bowel sounds, soft, minimal tenderness left lower quadrant; no masses palpated; no distention; no hepatosplenomegaly. Skin: no rash or induration seen on limited exam Musculoskeletal: grossly normal tone BUE/BLE Psychiatric: Flat affect, speech fluent and appropriate Neurologic: grossly non-focal; cranial nerves II through XII are intact.           Labs on Admission:  Basic Metabolic Panel:  Recent Labs Lab 04/16/15 0759 04/16/15 1033  NA 131* 135  K 4.6 4.2  CL 98* 106  CO2 15* 13*  GLUCOSE 358*  --   BUN 27*  --   CREATININE 0.90  --   CALCIUM 11.5*  --    Liver Function Tests:  Recent Labs Lab 04/16/15 0759  AST 28  ALT 23  ALKPHOS 124  BILITOT 1.5*  PROT 9.3*  ALBUMIN 5.5*    Recent Labs Lab 04/16/15 0759  LIPASE 15*   No results for input(s): AMMONIA in the last 168 hours. CBC:  Recent  Labs Lab 04/16/15 0759  WBC 18.1*  HGB 15.9*  HCT 45.8  MCV 91.4  PLT 472*   Cardiac Enzymes: No results for input(s): CKTOTAL, CKMB, CKMBINDEX, TROPONINI in the last 168 hours.  BNP (last 3 results) No results for input(s): BNP in the last 8760 hours.  ProBNP (last 3 results) No results for input(s): PROBNP in the last 8760 hours.  CBG:  Recent Labs Lab 04/16/15 0730 04/16/15 0938 04/16/15 1051  GLUCAP 335* 333* 244*    Radiological Exams on Admission: No results found.  EKG: Independently reviewed.   Assessment/Plan Active Problems:   Hereditary pancreatitis   DKA, type 1 ([place])   Diabetic ketoacidosis without coma associated with type 1 diabetes mellitus ([facility])   Intractable nausea and vomiting   Abdominal pain, left lower quadrant   Leukocytosis   62. 24 year old with history of type 1 diabetes mellitus, presents with recurrent DKA. Patient's leukocytosis, nausea, vomiting, and abdominal pain are likely the consequence of DKA. Her anion gap is 18 with a CO2 of 15 on admission. Her CBG was greater than 350. Her lipase is within normal limits. Her total bilirubin is minimally elevated. Doubt hepatobiliary disease causing her pain given that her pain is in the left lower quadrant and not epigastrium. She denies obstipation or diarrhea. Patient denies missing her insulin doses, but I do question her compliance. She is followed by endocrinologist, Dr. Dorris Fetch. Will consult him if needed.   Plan: 1. The patient was bolused a couple liters of IV fluids in the ED. She was subsequently started on insulin drip. 2. Will admit on the insulin drip protocol. Will continue vigorous IV fluids. 3. Symptomatic management with as needed IV morphine, when necessary IV Phenergan, and scheduled IV Reglan.      Code Status: Full code DVT Prophylaxis: Lovenox Family Communication: Discussed with mother Disposition Plan: Discharged to home in 48-72 hours.  Time spent: One  hour  [facility] Pager [phone]

## 2015-04-16 NOTE — ED Provider Notes (Signed)
CSN: [id number]     Arrival date & time 04/16/15  0720 History   First MD Initiated Contact with Patient 04/16/15 [phone]     Chief Complaint  Patient presents with  . Abdominal Pain     (Consider location/radiation/quality/duration/timing/severity/associated sxs/prior Treatment) HPI Comments: 24 y.o. Female with history of Type 1 DM, hereditary pancreatitis, DKA presents for nausea, vomiting, abdominal, and elevated blood glucose.  The patient and her mother report that two days ago while driving back from visiting the patient's grandparents in [place] the patient started to not feel well like she was having a flare of her pancreatitis.  She said that she had multiple episodes of vomiting that day but over the last 24 hours has only had a bout 1 episode although she continues to feel nauseous.  She has been taking her home Phenergan and Zofran to control the vomiting.  She has not been able to hold anything down and her blood glucose has been elevated and she has been unable to control it with her home insulin.  The patient also reports that she is having pain in her left abdomen that is constant and feels like her pancreatitis.  She feels this pain has been getting worse.  She denies pain with urination or radiation of the pain to her groin.  She and her mother report this episode is just like when she has had DKA and pancreatitis flares in the past.   Patient is a 24 y.o. female presenting with abdominal pain.  Abdominal Pain Associated symptoms: fatigue, nausea and vomiting   Associated symptoms: no chest pain, no chills, no constipation, no cough, no diarrhea, no dysuria, no fever, no hematuria, no shortness of breath and no sore throat     Past Medical History  Diagnosis Date  . Hereditary pancreatitis   . DM type 1 (diabetes mellitus, type 1) ([place])   . Hernia   . GERD (gastroesophageal reflux disease)   . Jaundice    Past Surgical History  Procedure Laterality Date  . Hernia  repair      As infant.  . Bile duct stent placement  2006 at [facility]  . Pancreatic cyst drainage  at age 72 year   Family History  Problem Relation Age of Onset  . Pancreatitis Father 30    hereditary  . Hypertension Father   . GER disease Father   . Breast cancer Maternal Aunt   . Congenital heart disease Maternal Grandfather   . Prostate cancer Maternal Grandfather    Social History  Substance Use Topics  . Smoking status: Never Smoker   . Smokeless tobacco: Never Used  . Alcohol Use: No   OB History    No data available     Review of Systems  Constitutional: Positive for appetite change and fatigue. Negative for fever and chills.  HENT: Negative for congestion, postnasal drip, sneezing and sore throat.   Eyes: Negative for visual disturbance.  Respiratory: Negative for cough, chest tightness and shortness of breath.   Cardiovascular: Negative for chest pain and palpitations.  Gastrointestinal: Positive for nausea, vomiting and abdominal pain. Negative for diarrhea and constipation.  Genitourinary: Negative for dysuria, urgency, hematuria and pelvic pain.  Musculoskeletal: Negative for myalgias and back pain.  Skin: Negative for pallor and rash.  Neurological: Negative for dizziness and numbness.  Hematological: Does not bruise/bleed easily.      Allergies  Cefzil; Shellfish allergy; Penicillins; and Sulfa antibiotics  Home Medications   Prior to Admission medications  Medication Sig Start Date End Date Taking? Authorizing Provider  ferrous sulfate 325 (65 FE) MG tablet Take 325 mg by mouth daily with breakfast.    Historical Provider, MD  hyoscyamine (LEVSIN SL) 0.125 MG SL tablet PLACE 1 TABLET UNDER THE TONGUE EVERY 4 HOURS AS NEEDED FOR PAIN. Patient taking differently: Take 0.125 mg by mouth every 4 (four) hours as needed. PLACE 1 TABLET UNDER THE TONGUE EVERY 4 HOURS AS NEEDED FOR PAIN. 08/04/14   [name], MD  ibuprofen (ADVIL,MOTRIN) 200 MG tablet  Take 400 mg by mouth every 6 (six) hours as needed. pain    Historical Provider, MD  insulin aspart (NOVOLOG) 100 UNIT/ML injection Inject 6 Units into the skin 3 (three) times daily before meals. *Increase subcutaneous injection by 1 unit for every 50 units over 150.* Patient taking differently: Inject 8 Units into the skin 3 (three) times daily before meals. *Increase subcutaneous injection by 1 unit for every 50 units over 150.* 04/02/13   [name], MD  metoCLOPramide (REGLAN) 5 MG tablet Take 1 tablet (5 mg total) by mouth every 8 (eight) hours as needed for nausea. 02/24/15   Kathie Dike, MD  Multiple Vitamins-Minerals (MULTIVITAMINS THER. W/MINERALS) TABS Take 1 tablet by mouth daily. For energy    Historical Provider, MD  omeprazole (PRILOSEC) 20 MG capsule Take 1 capsule (20 mg total) by mouth daily. 01/31/15   [name], MD  ondansetron (ZOFRAN-ODT) 4 MG disintegrating tablet DISSOLVE 1 TABLET BY MOUTH EVERY 8 HOURS AS NEEDED FOR NAUSEA OR VOMITING. 01/31/15   [name], MD  oxyCODONE-acetaminophen (PERCOCET) 7.5-325 MG per tablet Take one tablet up to BID prn pain 01/31/15   [name], MD  Pancrelipase, Lip-Prot-Amyl, 6000 UNITS CPEP Take 2 capsules (12,000 Units total) by mouth 3 (three) times daily before meals. 01/31/15   [name], MD  promethazine (PHENERGAN) 25 MG suppository Place 1 suppository (25 mg total) rectally every 6 (six) hours as needed for nausea or vomiting. 02/24/15   Kathie Dike, MD  promethazine (PHENERGAN) 25 MG tablet TAKE 1 TABLET BY MOUTH EVERY 6 HOURS AS NEEDED FOR NAUSEA. 01/31/15   [name], MD  SUMAtriptan (IMITREX) 50 MG tablet TAKE 1 TABLET BY MOUTH ONCE. MAY REPEAT IN 2 HOURS IF HEADACHE PERSISTS OR RECURS. 03/02/15   [name], MD  TOUJEO SOLOSTAR 300 UNIT/ML SOPN INJECT 30 UNITS SUBCUTANEOUSLY AT BEDTIME. 04/02/15   [name], MD  vitamin C (ASCORBIC ACID) 500 MG tablet Take 500 mg by mouth daily.     Historical Provider, MD   LMP 04/02/2015 Physical Exam  Constitutional: She is oriented to person, place, and time. She appears well-developed and well-nourished. No distress.  HENT:  Head: Normocephalic and atraumatic.  Right Ear: External ear normal.  Left Ear: External ear normal.  Nose: Nose normal.  Mouth/Throat: Oropharynx is clear and moist. Mucous membranes are dry. No oropharyngeal exudate.  Eyes: EOM are normal. Pupils are equal, round, and reactive to light.  Neck: Normal range of motion. Neck supple.  Cardiovascular: Normal rate, regular rhythm, normal heart sounds and intact distal pulses.   No murmur heard. Pulmonary/Chest: Effort normal. No respiratory distress. She has no wheezes. She has no rales.  Abdominal: Soft. She exhibits no distension. There is no tenderness.  Abdomen soft without appreciable tenderness to palpation although patient reports pain in left mid abdomen and can direct my hand to one area of pain  Musculoskeletal: Normal  range of motion. She exhibits no edema or tenderness.  Neurological: She is alert and oriented to person, place, and time.  Skin: Skin is warm and dry. No rash noted. She is not diaphoretic.  Vitals reviewed.   ED Course  Procedures (including critical care time)  CRITICAL CARE Performed by: Earlie Server   Total critical care time: 34 minutes  Critical care time was exclusive of separately billable procedures and treating other patients.  Critical care was necessary to treat or prevent imminent or life-threatening deterioration.  Critical care was time spent personally by me on the following activities: development of treatment plan with patient and/or surrogate as well as nursing, discussions with consultants, evaluation of patient's response to treatment, examination of patient, obtaining history from patient or surrogate, ordering and performing treatments and interventions, ordering and review of laboratory studies,  ordering and review of radiographic studies, pulse oximetry and re-evaluation of patient's condition.  Labs Review Labs Reviewed  CBG MONITORING, ED - Abnormal; Notable for the following:    Glucose-Capillary 335 (*)    All other components within normal limits  LIPASE, BLOOD  COMPREHENSIVE METABOLIC PANEL  CBC  URINALYSIS, ROUTINE W REFLEX MICROSCOPIC (NOT AT [facility])  PREGNANCY, URINE  BETA-HYDROXYBUTYRIC ACID  BLOOD GAS, VENOUS    Imaging Review No results found. I have personally reviewed and evaluated these images and lab results as part of my medical decision-making.   EKG Interpretation None      MDM  Patient seen and evaluated at bedside.  Presentation consistent with previous episodes of DKA.  Labs consistent with DKA.  Patient hydrated with IV fluids.  Symptoms improving with phenergan, dilaudid, fluids.  Patient started on Insulin drip.  Discussed with Dr. Caryn Section who agreed with admission.  Patient admitted to the stepdown unit under her care.  Discussed results and plan of care with patient and mother at bedside who expressed understanding and agreement. Final diagnoses:  None    1. DKA  2. Acute kidney injury  3. Intractable nausea with vomiting    Harvel Quale, MD 04/16/15 [phone]

## 2015-04-16 NOTE — ED Notes (Signed)
Pt c/o of LLQ abdominal pain and n/v x 2 days. Pt hx of pancreatitis. Per mom, pt CBG at home was 226.

## 2015-04-17 ENCOUNTER — Ambulatory Visit: Payer: BC Managed Care – PPO | Admitting: "Endocrinology

## 2015-04-17 LAB — CBC
HCT: 36 % (ref 36.0–46.0)
HEMOGLOBIN: 11.9 g/dL — AB (ref 12.0–15.0)
MCH: 30.2 pg (ref 26.0–34.0)
MCHC: 33.1 g/dL (ref 30.0–36.0)
MCV: 91.4 fL (ref 78.0–100.0)
PLATELETS: 278 10*3/uL (ref 150–400)
RBC: 3.94 MIL/uL (ref 3.87–5.11)
RDW: 12.3 % (ref 11.5–15.5)
WBC: 8.3 10*3/uL (ref 4.0–10.5)

## 2015-04-17 LAB — GLUCOSE, CAPILLARY
GLUCOSE-CAPILLARY: 61 mg/dL — AB (ref 65–99)
GLUCOSE-CAPILLARY: 271 mg/dL — AB (ref 65–99)
Glucose-Capillary: 102 mg/dL — ABNORMAL HIGH (ref 65–99)
Glucose-Capillary: 205 mg/dL — ABNORMAL HIGH (ref 65–99)
Glucose-Capillary: 110 mg/dL — ABNORMAL HIGH (ref 65–99)

## 2015-04-17 LAB — BASIC METABOLIC PANEL
Anion gap: 8 (ref 5–15)
BUN: 17 mg/dL (ref 6–20)
CHLORIDE: 114 mmol/L — AB (ref 101–111)
CO2: 19 mmol/L — AB (ref 22–32)
CREATININE: 0.43 mg/dL — AB (ref 0.44–1.00)
Calcium: 9.2 mg/dL (ref 8.9–10.3)
GFR calc Af Amer: >60 mL/min (ref >60)
GFR calc non Af Amer: >60 mL/min (ref >60)
Glucose, Bld: 59 mg/dL — ABNORMAL LOW (ref 65–99)
Potassium: 3.6 mmol/L (ref 3.5–5.1)
SODIUM: 141 mmol/L (ref 135–145)

## 2015-04-17 LAB — HEMOGLOBIN A1C
HEMOGLOBIN A1C: 10.8 % — AB (ref 4.8–5.6)
MEAN PLASMA GLUCOSE: 263 mg/dL

## 2015-04-17 MED ORDER — METOCLOPRAMIDE HCL 5 MG/ML IJ SOLN: 5.0000 mg | Freq: Four times a day (QID) | INTRAMUSCULAR | Status: DC | PRN

## 2015-04-17 MED ORDER — INSULIN ASPART 100 UNIT/ML ~~LOC~~ SOLN: 12.0000 [IU] | Freq: Three times a day (TID) | SUBCUTANEOUS | Status: DC

## 2015-04-17 MED ORDER — DEXTROSE 50 % IV SOLN: 1.0000 | Freq: Once | INTRAVENOUS | Status: DC

## 2015-04-17 MED ORDER — INSULIN GLARGINE 100 UNIT/ML ~~LOC~~ SOLN
15.0000 [IU] | Freq: Every day | SUBCUTANEOUS | Status: DC
  Administered 2015-04-17: 15 [IU] via SUBCUTANEOUS
  Filled 2015-04-17 (×2): qty 0.15

## 2015-04-17 NOTE — Progress Notes (Signed)
CBG of 61. Hypoglycemic protocol followed. Repeat CBG in 15 mins.

## 2015-04-17 NOTE — Progress Notes (Signed)
Recheck of cbg is 102

## 2015-04-17 NOTE — Care Management Note (Signed)
Case Management Note  Patient Details  Name: Linda Walls MRN: [id number] Date of Birth: 07-16-1990  Expected Discharge Date:  04/18/15               Expected Discharge Plan:  Home/Self Care  In-House Referral:  NA  Discharge planning Services  CM Consult  Post Acute Care Choice:  NA Choice offered to:  NA  DME Arranged:    DME Agency:     HH Arranged:    Reserve Agency:     Status of Service:  Completed, signed off  Medicare Important Message Given:    Date Medicare IM Given:    Medicare IM give by:    Date Additional Medicare IM Given:    Additional Medicare Important Message give by:     If discussed at [place] of Stay Meetings, dates discussed:    Additional Comments: Pt is from home, lives with parents and is ind at baseline. Pt admitted for DKA. Pt planning to return home with self care today. No CM needs noted.  Sherald Barge, RN 04/17/2015, 3:35 PM

## 2015-04-17 NOTE — Discharge Summary (Signed)
Physician Discharge Summary  Linda Walls VCB:[id number] DOB: September 07, 1990 DOA: 04/16/2015  PCP: [name], MD  Admit date: 04/16/2015 Discharge date: 04/17/2015  Time spent: Greater than 30 minutes  Recommendations for Outpatient Follow-up:  1. Patient was instructed to follow-up with Dr. Dorris Fetch, her endocrinologist in 1 week or less.    Discharge Diagnoses:  1. Type 1 diabetes mellitus with DKA; without coma. 2. Intractable nausea vomiting, secondary to DKA. 3. Left lower quadrant abdominal pain, likely from DKA. 4. Hereditary pancreatitis, with no evidence of exacerbation. 5. Leukocytosis, likely reactive. Resolved without antibiotic therapy. 6. Hypokalemia, repleted.  Discharge Condition: Improved.  Diet recommendation: Carbohydrate modified.  Filed Weights   04/16/15 0837 04/16/15 1029 04/17/15 0500  Weight: 47.628 kg (105 lb) 47.5 kg (104 lb 11.5 oz) 49.1 kg (108 lb 3.9 oz)    History of present illness:   Linda Walls is a 24 y.o. female with a history of type 1 diabetes mellitus, previous hospitalizations for DKA, history of hereditary pancreatitis, who presented with a two-day history of nausea, vomiting, and left lower quadrant abdominal pain.  In the ED, she was noted to be afebrile and mildly tachycardic. Her blood pressure was within normal limits. Her lab data were significant for a capillary blood glucose of 335, sodium 131, CO2 of 15, anion gap of 18, total bilirubin of 1.5, white blood cell count of 18.1, hemoglobin of 15.9, and a negative pregnancy test. Her urinalysis revealed greater than 80 ketones, greater than 1.03 specific gravity, 3-6 WBCs. She was admitted for further evaluation and management.  Hospital Course:  Patient was bolused a couple of liters of IV fluids in the ED. She was subsequently started on the insulin drip with protocol for adjustments in insulin. Her basic metabolic panel was ordered according to the insulin drip protocol. She  was started on vigorous IV fluids. Symptomatic treatment was given with as needed morphine, as needed Phenergan, and scheduled IV Reglan. With treatment, her anion gap closed and her acidosis was resolved. Her leukocytosis resolved. Her GI symptom at all at the resolved. The insulin drip was discontinued and she was started on Lantus and every 4 hours dosing of sliding scale NovoLog. Her diet was advanced which she tolerated well. The patient has brittle type 1 diabetes mellitus, and therefore, her discharge insulin regimen was resumed upon discharge. Rather than micromanage her insulin regimen in the setting of DKA treatment, she was instructed to follow-up with Dr. Dorris Fetch who could adjust her insulin/medication regimen accordingly. The patient and her parents agreed. At the time of discharge, her CBG was 110. The patient was advised to be cognizant of hypoglycemia and to avoid taking insulin if her CBGs were less than 105-110. She voiced understanding.  Procedures:  None  Consultations:  None  Discharge Exam: Filed Vitals:   04/17/15 1149  BP:   Pulse:   Temp: 98.3 F (36.8 C)  Resp:    temperature 98.3. Pulse 74. Respiratory rate 18. Blood pressure 102/67.  General: Alert 24 year old Caucasian woman in no acute distress. Cardiovascular: S1, S2, no murmurs rubs or gallops. Respiratory: Clear to auscultation bilaterally. Abdomen: Positive bowel sounds, soft, nontender, nondistended.   Discharge Instructions   Discharge Instructions    Diet Carb Modified    Complete by:  As directed      Discharge instructions    Complete by:  As directed   Resume previous insulin dosing but be aware of low blood sugars. F/up with Dr. Dorris Fetch in 5-7  days or less.     Increase activity slowly    Complete by:  As directed           Current Discharge Medication List    CONTINUE these medications which have CHANGED   Details  insulin aspart (NOVOLOG) 100 UNIT/ML injection Inject 12 Units into the  skin 3 (three) times daily before meals. BE AWARE OF LOW BLOOD SUGARS AND DO NOT TAKE IF YOUR BLOOD SUGAR IS 110 OR BELOW  *Increase subcutaneous injection by 1 unit for every 50 units over 150.*      CONTINUE these medications which have NOT CHANGED   Details  ferrous sulfate 325 (65 FE) MG tablet Take 325 mg by mouth daily with breakfast.    GLUCAGON EMERGENCY 1 MG injection Inject 1 mg as directed once.    hyoscyamine (LEVSIN SL) 0.125 MG SL tablet PLACE 1 TABLET UNDER THE TONGUE EVERY 4 HOURS AS NEEDED FOR PAIN. Qty: 30 tablet, Refills: 11    ibuprofen (ADVIL,MOTRIN) 200 MG tablet Take 400 mg by mouth every 6 (six) hours as needed. pain    metoCLOPramide (REGLAN) 5 MG tablet Take 1 tablet (5 mg total) by mouth every 8 (eight) hours as needed for nausea. Qty: 60 tablet, Refills: 0    Multiple Vitamins-Minerals (MULTIVITAMINS THER. W/MINERALS) TABS Take 1 tablet by mouth daily. For energy    omeprazole (PRILOSEC) 20 MG capsule Take 1 capsule (20 mg total) by mouth daily. Qty: 30 capsule, Refills: 5    ondansetron (ZOFRAN-ODT) 4 MG disintegrating tablet DISSOLVE 1 TABLET BY MOUTH EVERY 8 HOURS AS NEEDED FOR NAUSEA OR VOMITING. Qty: 20 tablet, Refills: 5    oxyCODONE-acetaminophen (PERCOCET) 7.5-325 MG per tablet Take one tablet up to BID prn pain Qty: 60 tablet, Refills: 0    Pancrelipase, Lip-Prot-Amyl, 6000 UNITS CPEP Take 2 capsules (12,000 Units total) by mouth 3 (three) times daily before meals. Qty: 180 capsule, Refills: 5    promethazine (PHENERGAN) 25 MG tablet TAKE 1 TABLET BY MOUTH EVERY 6 HOURS AS NEEDED FOR NAUSEA. Qty: 30 tablet, Refills: 5    SUMAtriptan (IMITREX) 50 MG tablet TAKE 1 TABLET BY MOUTH ONCE. MAY REPEAT IN 2 HOURS IF HEADACHE PERSISTS OR RECURS. Qty: 6 tablet, Refills: 6    TOUJEO SOLOSTAR 300 UNIT/ML SOPN INJECT 30 UNITS SUBCUTANEOUSLY AT BEDTIME. Qty: 4.5 mL, Refills: 2    vitamin C (ASCORBIC ACID) 500 MG tablet Take 500 mg by mouth daily.       STOP taking these medications     promethazine (PHENERGAN) 25 MG suppository        Allergies  Allergen Reactions  . Cefzil [Cefprozil]     Sickness  . Shellfish Allergy Nausea And Vomiting  . Penicillins Rash    Has patient had a PCN reaction causing immediate rash, facial/tongue/throat swelling, SOB or lightheadedness with hypotension: NO Has patient had a PCN reaction causing severe rash involving mucus membranes or skin necrosis: NO Has patient had a PCN reaction that required hospitalization: NO Has patient had a PCN reaction occurring within the last 10 years: NO If all of the above answers are "NO", then may proceed with Cephalosporin use.   . Sulfa Antibiotics Rash   Follow-up Information    Schedule an appointment as soon as possible for a visit with Glade Lloyd, MD.   Specialty:  Endocrinology   Why:  Call to make an appt. to be seen in 1 week or less.   Contact information:  [place] [address] [phone]        The results of significant diagnostics from this hospitalization (including imaging, microbiology, ancillary and laboratory) are listed below for reference.    Significant Diagnostic Studies: No results found.  Microbiology: Recent Results (from the past 240 hour(s))  MRSA PCR Screening     Status: None   Collection Time: 04/16/15 11:50 AM  Result Value Ref Range Status   MRSA by PCR NEGATIVE NEGATIVE Final    Comment:        The GeneXpert MRSA Assay (FDA approved for NASAL specimens only), is one component of a comprehensive MRSA colonization surveillance program. It is not intended to diagnose MRSA infection nor to guide or monitor treatment for MRSA infections.      Labs: Basic Metabolic Panel:  Recent Labs Lab 04/16/15 0759 04/16/15 1033 04/16/15 1242 04/16/15 1607 04/16/15 2200 04/17/15 0451  NA 131* 135 134* 137 138 141  K 4.6 4.2 3.9 4.3 3.2* 3.6  CL 98* 106 112* 110 112* 114*  CO2 15* 13* 18* 20*  21* 19*  GLUCOSE 358*  --  212* 99 99 59*  BUN 27*  --  22* 20 17 17   CREATININE 0.90  --  0.61 0.53 0.50 0.43*  CALCIUM 11.5*  --  9.2 9.7 9.4 9.2   Liver Function Tests:  Recent Labs Lab 04/16/15 0759  AST 28  ALT 23  ALKPHOS 124  BILITOT 1.5*  PROT 9.3*  ALBUMIN 5.5*    Recent Labs Lab 04/16/15 0759  LIPASE 15*   No results for input(s): AMMONIA in the last 168 hours. CBC:  Recent Labs Lab 04/16/15 0759 04/17/15 0451  WBC 18.1* 8.3  HGB 15.9* 11.9*  HCT 45.8 36.0  MCV 91.4 91.4  PLT 472* 278   Cardiac Enzymes: No results for input(s): CKTOTAL, CKMB, CKMBINDEX, TROPONINI in the last 168 hours. BNP: BNP (last 3 results) No results for input(s): BNP in the last 8760 hours.  ProBNP (last 3 results) No results for input(s): PROBNP in the last 8760 hours.  CBG:  Recent Labs Lab 04/17/15 0458 04/17/15 0536 04/17/15 0721 04/17/15 1139 04/17/15 1504  GLUCAP 61* 102* 205* 271* 110*       Signed:  Jatinder Mcdonagh  [facility] 04/17/2015, 3:26 PM

## 2015-04-17 NOTE — Progress Notes (Signed)
Orders received for discharge; instructions along with medications reviewed with the pt; understanding verbalized. Pt taken out with belongings via [place] by NT in stable condition.

## 2015-04-17 NOTE — Progress Notes (Addendum)
Inpatient Diabetes Program Recommendations  AACE/ADA: New Consensus Statement on Inpatient Glycemic Control (2015)  Target Ranges:  Prepandial:   less than 140 mg/dL      Peak postprandial:   less than 180 mg/dL (1-2 hours)      Critically ill patients:  140 - 180 mg/dL  Results for MARCY, SOOKDEO (MRN [id number]) as of 04/17/2015 07:37  Ref. Range 04/16/2015 16:55 04/16/2015 19:35 04/16/2015 23:51 04/17/2015 04:58 04/17/2015 05:36 04/17/2015 07:21  Glucose-Capillary Latest Ref Range: 65-99 mg/dL 91 262 (H) 85 61 (L) 102 (H) 205 (H)  Results for ZALA, DEGRASSE (MRN [id number]) as of 04/17/2015 07:37  Ref. Range 04/16/2015 08:00  Hemoglobin A1C Latest Ref Range: 4.8-5.6 % 10.8 (H)   Review of Glycemic Control Diabetes history: DM1 Outpatient Diabetes medications: Toujeo 30 units QPM, Novolog 6 units TID with meals plus 1 unit for every 50 mg/dl above target of 150 mg/dl Current orders for Inpatient glycemic control: Novolog 0-15 units Q4H  Inpatient Diabetes Program Recommendations IV fluids: Please re-evaluate need for dextrose in IV fluids as patient is currently ordered D5 1/2 @ 75 ml/hr.   Insulin - Basal: Patient received a one time order of Lantus 15 units yesterday at 18:45 at time of transition from IV to SQ insulin. Please consider ordering Lantus 15 units QHS (based on 49 kg x 0.3 units).  Correction (SSI): Patient has Type 1 diabetes and is very sensitive to insulin. Patient uses a 1 to 50 mg/dl sensitivity factor as an outpatient. Please consider decreasing Novolog correction scale to sensitive or may want to consider changing Novolog correction to a custom scale Novolog 0-5 units Q4H (150-200 give 1 unit, 201-250 give 2 units, 251-300 give 3 units, 301-350 give 4 units, 351-400 give 5 units, >401 call MD).  Insulin - Meal Coverage: Patient uses Novolog 6 units TID with meals for meal coverage as an outpatient. Please consider ordering Novolog 0-10 units TID with meals for  meal coverage in which 1 unit covers 10 grams of carbohydrates.  NURSING: Please be sure patient eats first before receiving carb coverage.   Thanks, Barnie Alderman, RN, MSN, CCRN, CDE Diabetes Coordinator Inpatient Diabetes Program [phone] (Team Pager from [place] to [place]) [phone] ([place] office) [phone] [facility] office)

## 2015-05-05 ENCOUNTER — Inpatient Hospital Stay (HOSPITAL_COMMUNITY)
Admission: EM | Admit: 2015-05-05 | Discharge: 2015-05-08 | DRG: 638 | Disposition: A | Discharge status: Home / Self Care | Payer: BC Managed Care – PPO | Attending: Internal Medicine | Admitting: Internal Medicine

## 2015-05-05 ENCOUNTER — Encounter (HOSPITAL_COMMUNITY): Payer: Self-pay

## 2015-05-05 ENCOUNTER — Emergency Department (HOSPITAL_COMMUNITY): Payer: BC Managed Care – PPO

## 2015-05-05 DIAGNOSIS — Z794 Long term (current) use of insulin: Secondary | ICD-10-CM

## 2015-05-05 DIAGNOSIS — E109 Type 1 diabetes mellitus without complications: Secondary | ICD-10-CM | POA: Diagnosis present

## 2015-05-05 DIAGNOSIS — K859 Acute pancreatitis without necrosis or infection, unspecified: Secondary | ICD-10-CM | POA: Diagnosis present

## 2015-05-05 DIAGNOSIS — E10649 Type 1 diabetes mellitus with hypoglycemia without coma: Secondary | ICD-10-CM | POA: Diagnosis present

## 2015-05-05 DIAGNOSIS — K861 Other chronic pancreatitis: Secondary | ICD-10-CM | POA: Diagnosis present

## 2015-05-05 DIAGNOSIS — G43909 Migraine, unspecified, not intractable, without status migrainosus: Secondary | ICD-10-CM | POA: Diagnosis present

## 2015-05-05 DIAGNOSIS — Z8249 Family history of ischemic heart disease and other diseases of the circulatory system: Secondary | ICD-10-CM

## 2015-05-05 DIAGNOSIS — E1010 Type 1 diabetes mellitus with ketoacidosis without coma: Secondary | ICD-10-CM | POA: Diagnosis present

## 2015-05-05 DIAGNOSIS — R112 Nausea with vomiting, unspecified: Secondary | ICD-10-CM | POA: Diagnosis present

## 2015-05-05 DIAGNOSIS — N179 Acute kidney failure, unspecified: Secondary | ICD-10-CM | POA: Diagnosis present

## 2015-05-05 DIAGNOSIS — Z803 Family history of malignant neoplasm of breast: Secondary | ICD-10-CM

## 2015-05-05 DIAGNOSIS — Z820 Family history of epilepsy and other diseases of the nervous system: Secondary | ICD-10-CM

## 2015-05-05 DIAGNOSIS — K219 Gastro-esophageal reflux disease without esophagitis: Secondary | ICD-10-CM | POA: Diagnosis present

## 2015-05-05 LAB — COMPREHENSIVE METABOLIC PANEL
ALT: 20 U/L (ref 14–54)
AST: 26 U/L (ref 15–41)
Albumin: 5.3 g/dL — ABNORMAL HIGH (ref 3.5–5.0)
Alkaline Phosphatase: 136 U/L — ABNORMAL HIGH (ref 38–126)
Anion gap: 20 — ABNORMAL HIGH (ref 5–15)
BUN: 21 mg/dL — ABNORMAL HIGH (ref 6–20)
CO2: 14 mmol/L — ABNORMAL LOW (ref 22–32)
Calcium: 9.9 mg/dL (ref 8.9–10.3)
Chloride: 107 mmol/L (ref 101–111)
Creatinine, Ser: 1.23 mg/dL — ABNORMAL HIGH (ref 0.44–1.00)
GFR calc Af Amer: >60 mL/min (ref >60)
GFR calc non Af Amer: >60 mL/min (ref >60)
Glucose, Bld: 166 mg/dL — ABNORMAL HIGH (ref 65–99)
Potassium: 4.4 mmol/L (ref 3.5–5.1)
Sodium: 141 mmol/L (ref 135–145)
Total Bilirubin: 1.2 mg/dL (ref 0.3–1.2)
Total Protein: 9.1 g/dL — ABNORMAL HIGH (ref 6.5–8.1)

## 2015-05-05 LAB — CBC WITH DIFFERENTIAL/PLATELET
Basophils Absolute: 0 10*3/uL (ref 0.0–0.1)
Basophils Relative: 0 %
Eosinophils Absolute: 0 10*3/uL (ref 0.0–0.7)
Eosinophils Relative: 0 %
HCT: 44.5 % (ref 36.0–46.0)
Hemoglobin: 14.8 g/dL (ref 12.0–15.0)
Lymphocytes Relative: 11 %
Lymphs Abs: 2 10*3/uL (ref 0.7–4.0)
MCH: 30.3 pg (ref 26.0–34.0)
MCHC: 33.3 g/dL (ref 30.0–36.0)
MCV: 91.2 fL (ref 78.0–100.0)
Monocytes Absolute: 1.2 10*3/uL — ABNORMAL HIGH (ref 0.1–1.0)
Monocytes Relative: 7 %
Neutro Abs: 15 10*3/uL — ABNORMAL HIGH (ref 1.7–7.7)
Neutrophils Relative %: 82 %
Platelets: 513 10*3/uL — ABNORMAL HIGH (ref 150–400)
RBC: 4.88 MIL/uL (ref 3.87–5.11)
RDW: 12.2 % (ref 11.5–15.5)
WBC: 18.2 10*3/uL — ABNORMAL HIGH (ref 4.0–10.5)

## 2015-05-05 LAB — LIPASE, BLOOD: Lipase: 17 U/L (ref 11–51)

## 2015-05-05 LAB — LACTIC ACID, PLASMA: Lactic Acid, Venous: 1.4 mmol/L (ref 0.5–2.0)

## 2015-05-05 LAB — CBG MONITORING, ED: Glucose-Capillary: 290 mg/dL — ABNORMAL HIGH (ref 65–99)

## 2015-05-05 MED ORDER — METOCLOPRAMIDE HCL 5 MG/ML IJ SOLN
10.0000 mg | Freq: Once | INTRAMUSCULAR | Status: AC
  Administered 2015-05-05: 10 mg via INTRAVENOUS
  Filled 2015-05-05: qty 2

## 2015-05-05 MED ORDER — MORPHINE SULFATE (PF) 4 MG/ML IV SOLN
4.0000 mg | INTRAVENOUS | Status: DC | PRN
  Administered 2015-05-05 (×2): 4 mg via INTRAVENOUS
  Filled 2015-05-05 (×2): qty 1

## 2015-05-05 MED ORDER — DEXTROSE-NACL 5-0.45 % IV SOLN: INTRAVENOUS | Status: DC

## 2015-05-05 MED ORDER — ONDANSETRON HCL 4 MG/2ML IJ SOLN
4.0000 mg | Freq: Once | INTRAMUSCULAR | Status: AC
  Administered 2015-05-06: 4 mg via INTRAVENOUS
  Filled 2015-05-05: qty 2

## 2015-05-05 MED ORDER — IOHEXOL 300 MG/ML  SOLN
100.0000 mL | Freq: Once | INTRAMUSCULAR | Status: AC | PRN
  Administered 2015-05-05: 100 mL via INTRAVENOUS

## 2015-05-05 MED ORDER — IOHEXOL 300 MG/ML  SOLN
50.0000 mL | Freq: Once | INTRAMUSCULAR | Status: AC | PRN
  Administered 2015-05-05: 50 mL via ORAL

## 2015-05-05 MED ORDER — SODIUM CHLORIDE 0.9 % IV BOLUS (SEPSIS)
1000.0000 mL | Freq: Once | INTRAVENOUS | Status: AC
  Administered 2015-05-05: 1000 mL via INTRAVENOUS

## 2015-05-05 MED ORDER — SODIUM CHLORIDE 0.9 % IV SOLN
INTRAVENOUS | Status: DC
  Filled 2015-05-05: qty 2.5

## 2015-05-05 MED ORDER — SODIUM CHLORIDE 0.9 % IV SOLN: Freq: Once | INTRAVENOUS | Status: DC

## 2015-05-05 MED ORDER — POTASSIUM CHLORIDE 10 MEQ/100ML IV SOLN
10.0000 meq | INTRAVENOUS | Status: DC
  Filled 2015-05-05: qty 100

## 2015-05-05 NOTE — ED Provider Notes (Signed)
CSN: [id number]     Arrival date & time 05/05/15  1943 History  By signing my name below, I, Meriel Pica, attest that this documentation has been prepared under the direction and in the presence of Virgel Manifold, MD. Electronically Signed: Meriel Pica, ED Scribe. 05/05/2015. 8:39 PM.   Chief Complaint  Patient presents with  . Emesis   The history is provided by the patient and a parent. No language interpreter was used.   HPI Comments: Linda Walls is a 24 y.o. female, with a PMhx of DM, hereditary pancreatitis, and DKA, who presents to the Emergency Department complaining of 1 episode of emesis onset last night with episodes continuing throughout the day today. She associates constant LLQ abdominal pain onset after vomiting and 1 episode of diarrhea today. She reports her current LLQ abdominal pain is characteristic of her pancreatitis. PMhx of DM with CBG in the 500s this morning.  Pt is not followed by a GI specialist but has been referred to [facility] for an endoscopic study. 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 They have a concern for possible reoccurrence. Denies hematemesis or any urinary symptoms. She was last seen in the ED for similar symptoms 3 weeks ago when she was admitted for DKA. Pt is followed by endocrinologist Dr. Dorris Fetch.   Past Medical History  Diagnosis Date  . Hereditary pancreatitis   . DM type 1 (diabetes mellitus, type 1) ([place])   . Hernia   . GERD (gastroesophageal reflux disease)   . Jaundice    Past Surgical History  Procedure Laterality Date  . Hernia repair      As infant.  . Bile duct stent placement  2006 at [facility]  . Pancreatic cyst drainage  at age 82 year   Family History  Problem Relation Age of Onset  . Pancreatitis Father 30    hereditary  . Hypertension Father   . GER disease Father   . Breast cancer Maternal Aunt   . Congenital heart disease Maternal  Grandfather   . Prostate cancer Maternal Grandfather    Social History  Substance Use Topics  . Smoking status: Never Smoker   . Smokeless tobacco: Never Used  . Alcohol Use: No   OB History    No data available     Review of Systems  Constitutional: Negative for fever.  Gastrointestinal: Positive for vomiting, abdominal pain and diarrhea.  Genitourinary: Negative for dysuria, urgency and frequency.  All other systems reviewed and are negative.  Allergies  Shellfish allergy; Cefzil; Penicillins; and Sulfa antibiotics  Home Medications   Prior to Admission medications   Medication Sig Start Date End Date Taking? Authorizing Provider  ferrous sulfate 325 (65 FE) MG tablet Take 325 mg by mouth daily with breakfast.   Yes Historical Provider, MD  hyoscyamine (LEVSIN SL) 0.125 MG SL tablet PLACE 1 TABLET UNDER THE TONGUE EVERY 4 HOURS AS NEEDED FOR PAIN. Patient taking differently: Take 0.125 mg by mouth every 4 (four) hours as needed. PLACE 1 TABLET UNDER THE TONGUE EVERY 4 HOURS AS NEEDED FOR PAIN. 08/04/14  Yes [name], MD  ibuprofen (ADVIL,MOTRIN) 200 MG tablet Take 400 mg by mouth every 6 (six) hours as needed. pain   Yes Historical Provider, MD  insulin aspart (NOVOLOG) 100 UNIT/ML injection Inject 12 Units into the skin 3 (three) times daily before meals. BE AWARE OF LOW BLOOD SUGARS AND  DO NOT TAKE IF YOUR BLOOD SUGAR IS 110 OR BELOW  *Increase subcutaneous injection by 1 unit for every 50 units over 150.* 04/17/15  Yes Rexene Alberts, MD  metoCLOPramide (REGLAN) 5 MG tablet Take 1 tablet (5 mg total) by mouth every 8 (eight) hours as needed for nausea. 02/24/15  Yes Kathie Dike, MD  Multiple Vitamins-Minerals (MULTIVITAMINS THER. W/MINERALS) TABS Take 1 tablet by mouth daily. For energy   Yes Historical Provider, MD  omeprazole (PRILOSEC) 20 MG capsule Take 1 capsule (20 mg total) by mouth daily. Patient taking differently: Take 20 mg by mouth daily as needed (Acid  Reflux).  01/31/15  Yes [name], MD  ondansetron (ZOFRAN-ODT) 4 MG disintegrating tablet DISSOLVE 1 TABLET BY MOUTH EVERY 8 HOURS AS NEEDED FOR NAUSEA OR VOMITING. 01/31/15  Yes [name], MD  oxyCODONE-acetaminophen (PERCOCET) 7.5-325 MG per tablet Take one tablet up to BID prn pain Patient taking differently: Take 1 tablet by mouth 2 (two) times daily as needed for moderate pain. Take one tablet up to BID prn pain 01/31/15  Yes [name], MD  Pancrelipase, Lip-Prot-Amyl, 6000 UNITS CPEP Take 2 capsules (12,000 Units total) by mouth 3 (three) times daily before meals. 01/31/15  Yes [name], MD  promethazine (PHENERGAN) 25 MG suppository Place 25 mg rectally every 6 (six) hours as needed. 02/24/15  Yes Historical Provider, MD  promethazine (PHENERGAN) 25 MG tablet TAKE 1 TABLET BY MOUTH EVERY 6 HOURS AS NEEDED FOR NAUSEA. 01/31/15  Yes [name], MD  SUMAtriptan (IMITREX) 50 MG tablet TAKE 1 TABLET BY MOUTH ONCE. MAY REPEAT IN 2 HOURS IF HEADACHE PERSISTS OR RECURS. 03/02/15  Yes [name], MD  TOUJEO SOLOSTAR 300 UNIT/ML SOPN INJECT 30 UNITS SUBCUTANEOUSLY AT BEDTIME. 04/02/15  Yes [name], MD  vitamin C (ASCORBIC ACID) 500 MG tablet Take 500 mg by mouth daily.   Yes Historical Provider, MD  GLUCAGON EMERGENCY 1 MG injection Inject 1 mg as directed once. 03/13/15   Historical Provider, MD   BP 89/57 mmHg  Pulse 126  Temp(Src) 97.6 F (36.4 C) (Oral)  Resp 18  Ht 5\' 2"  (1.575 m)  Wt 110 lb (49.896 kg)  BMI 20.11 kg/m2  SpO2 92%  LMP 04/02/2015 (Approximate) Physical Exam  Constitutional: She is oriented to person, place, and time. She appears well-developed.  Laying in bed. Tired appearing.   HENT:  Head: Normocephalic and atraumatic.  Eyes: EOM are normal. Pupils are equal, round, and reactive to light.  Neck: Neck supple.  Cardiovascular: Regular rhythm and normal heart sounds.   Tachycardic.   Pulmonary/Chest: Effort normal and breath  sounds normal. No respiratory distress. She has no wheezes.  Abdominal: Soft. Bowel sounds are normal. There is tenderness. There is no rebound and no guarding.  Diffuse abdominal tenderness, worse in LLQ; no rebound, no guarding.   Musculoskeletal: Normal range of motion. She exhibits no edema.  Neurological: She is alert and oriented to person, place, and time. No cranial nerve deficit.  Skin: Skin is warm and dry.  Psychiatric: She has a normal mood and affect.  Nursing note and vitals reviewed.   ED Course  Procedures   CRITICAL CARE Performed by: Virgel Manifold Total critical care time: 40 minutes Critical care time was exclusive of separately billable procedures and treating other patients. Critical care was necessary to treat or prevent imminent or life-threatening deterioration. Critical care was time spent personally by me on the following activities:  development of treatment plan with patient and/or surrogate as well as nursing, discussions with consultants, evaluation of patient's response to treatment, examination of patient, obtaining history from patient or surrogate, ordering and performing treatments and interventions, ordering and review of laboratory studies, ordering and review of radiographic studies, pulse oximetry and re-evaluation of patient's condition.  DIAGNOSTIC STUDIES: Oxygen Saturation is 92% on RA, low by my interpretation.    COORDINATION OF CARE: 8:16 PM Discussed treatment plan with pt at bedside and pt agreed to plan. Will order diagnostic labs and CT abdomen/pelvis and IV fluids with Reglan and pain medication.   Labs Review Labs Reviewed  CBC WITH DIFFERENTIAL/PLATELET - Abnormal; Notable for the following:    WBC 18.2 (*)    Platelets 513 (*)    Neutro Abs 15.0 (*)    Monocytes Absolute 1.2 (*)    All other components within normal limits  COMPREHENSIVE METABOLIC PANEL - Abnormal; Notable for the following:    CO2 14 (*)    Glucose, Bld 166  (*)    BUN 21 (*)    Creatinine, Ser 1.23 (*)    Total Protein 9.1 (*)    Albumin 5.3 (*)    Alkaline Phosphatase 136 (*)    Anion gap 20 (*)    All other components within normal limits  CBG MONITORING, ED - Abnormal; Notable for the following:    Glucose-Capillary 290 (*)    All other components within normal limits  LIPASE, BLOOD  LACTIC ACID, PLASMA  URINALYSIS, ROUTINE W REFLEX MICROSCOPIC (NOT AT [facility])  PREGNANCY, URINE  LACTIC ACID, PLASMA    Imaging Review Ct Abdomen Pelvis W Contrast  05/05/2015  CLINICAL DATA:  Nausea and vomiting, onset last night. Sharp left lower quadrant abdominal pain. EXAM: CT ABDOMEN AND PELVIS WITH CONTRAST TECHNIQUE: Multidetector CT imaging of the abdomen and pelvis was performed using the standard protocol following bolus administration of intravenous contrast. CONTRAST:  31mL OMNIPAQUE IOHEXOL 300 MG/ML SOLN, 192mL OMNIPAQUE IOHEXOL 300 MG/ML SOLN COMPARISON:  04/01/2013 FINDINGS: Lower chest:  No significant abnormality Hepatobiliary: There are normal appearances of the liver, gallbladder and bile ducts. Pancreas: Normal Spleen: Normal Adrenals/Urinary Tract: The adrenals and kidneys are normal in appearance. There is no urinary calculus evident. There is no hydronephrosis or ureteral dilatation. Collecting systems and ureters appear unremarkable. Stomach/Bowel: The stomach, small bowel and colon are unremarkable. There is no bowel obstruction. There is no focal inflammatory change. Generous volume colonic stool. Vascular/Lymphatic: The abdominal aorta is normal in caliber. There is no atherosclerotic calcification. There is no adenopathy in the abdomen or pelvis. Reproductive: Uterus and ovaries appear normal. Other: No ascites. Musculoskeletal: No significant abnormality. IMPRESSION: No acute findings are evident in the abdomen or pelvis. Generous colonic stool volume. Otherwise unremarkable. Electronically Signed   By: Andreas Newport M.D.   On:  05/05/2015 23:26   I have personally reviewed and evaluated these images and lab results as part of my medical decision-making.   MDM   Final diagnoses:  Type 1 diabetes mellitus with ketoacidosis without coma ([place])    24 year old female with abdominal pain, nausea and vomiting. Past history of hereditary pancreatitis. Patient family concern that she may potentially have a pseudocyst. CT of abdomen and pelvis does not show any acute abnormality today. Lipase normal. I suspect that she is actually in mild DKA. Her glucose on BMP is not overly elevated, but she reports blood sugars in the 500s earlier today and taking insulin just prior to  coming to ED. Repeat glucose 290. She has an anion gap metabolic acidosis. Lactic acid normal. Generalized weakness, abdominal pain, n/v, etc typical presentation of DKA and prior admissions with very similar symptoms. Her creatinine is twice her baseline. UA still pending. She remains tachycardic and persistent vomiting in the emergency room. IVF. Will start insulin gtt. Will discuss with medicine for admission.  I personally preformed the services scribed in my presence. The recorded information has been reviewed is accurate. Virgel Manifold, MD.    Virgel Manifold, MD 05/06/15 [phone]

## 2015-05-05 NOTE — ED Notes (Signed)
Vomiting that started last night and has continued into today. History of pancreatitis and diabetes. Last blood sugar was 168 around 1830, they were high with the highest one being 580.

## 2015-05-06 ENCOUNTER — Encounter (HOSPITAL_COMMUNITY): Payer: Self-pay | Admitting: Family Medicine

## 2015-05-06 DIAGNOSIS — R112 Nausea with vomiting, unspecified: Secondary | ICD-10-CM | POA: Diagnosis not present

## 2015-05-06 DIAGNOSIS — K219 Gastro-esophageal reflux disease without esophagitis: Secondary | ICD-10-CM | POA: Diagnosis not present

## 2015-05-06 DIAGNOSIS — N179 Acute kidney failure, unspecified: Secondary | ICD-10-CM | POA: Diagnosis not present

## 2015-05-06 DIAGNOSIS — Z820 Family history of epilepsy and other diseases of the nervous system: Secondary | ICD-10-CM | POA: Diagnosis not present

## 2015-05-06 DIAGNOSIS — E1010 Type 1 diabetes mellitus with ketoacidosis without coma: Principal | ICD-10-CM

## 2015-05-06 DIAGNOSIS — Z803 Family history of malignant neoplasm of breast: Secondary | ICD-10-CM | POA: Diagnosis not present

## 2015-05-06 DIAGNOSIS — K859 Acute pancreatitis without necrosis or infection, unspecified: Secondary | ICD-10-CM | POA: Diagnosis not present

## 2015-05-06 DIAGNOSIS — Z794 Long term (current) use of insulin: Secondary | ICD-10-CM | POA: Diagnosis not present

## 2015-05-06 DIAGNOSIS — K861 Other chronic pancreatitis: Secondary | ICD-10-CM | POA: Diagnosis present

## 2015-05-06 DIAGNOSIS — Z8249 Family history of ischemic heart disease and other diseases of the circulatory system: Secondary | ICD-10-CM | POA: Diagnosis not present

## 2015-05-06 DIAGNOSIS — E10649 Type 1 diabetes mellitus with hypoglycemia without coma: Secondary | ICD-10-CM | POA: Diagnosis present

## 2015-05-06 DIAGNOSIS — E1110 Type 2 diabetes mellitus with ketoacidosis without coma: Secondary | ICD-10-CM | POA: Insufficient documentation

## 2015-05-06 DIAGNOSIS — G43709 Chronic migraine without aura, not intractable, without status migrainosus: Secondary | ICD-10-CM

## 2015-05-06 DIAGNOSIS — G43909 Migraine, unspecified, not intractable, without status migrainosus: Secondary | ICD-10-CM | POA: Diagnosis present

## 2015-05-06 LAB — URINALYSIS, ROUTINE W REFLEX MICROSCOPIC
Glucose, UA: 500 mg/dL — AB
Hgb urine dipstick: NEGATIVE
Ketones, ur: >80 mg/dL — AB
Leukocytes, UA: NEGATIVE
Nitrite: NEGATIVE
Protein, ur: NEGATIVE mg/dL
Specific Gravity, Urine: 1.015 (ref 1.005–1.030)
Urobilinogen, UA: 0.2 mg/dL (ref 0.0–1.0)
pH: 5.5 (ref 5.0–8.0)

## 2015-05-06 LAB — BASIC METABOLIC PANEL
ANION GAP: 25 — AB (ref 5–15)
ANION GAP: 10 (ref 5–15)
ANION GAP: 9 (ref 5–15)
ANION GAP: 11 (ref 5–15)
ANION GAP: 7 (ref 5–15)
BUN: 17 mg/dL (ref 6–20)
BUN: 13 mg/dL (ref 6–20)
BUN: 12 mg/dL (ref 6–20)
BUN: 10 mg/dL (ref 6–20)
BUN: 7 mg/dL (ref 6–20)
CALCIUM: 9.3 mg/dL (ref 8.9–10.3)
CALCIUM: 8.7 mg/dL — AB (ref 8.9–10.3)
CALCIUM: 9 mg/dL (ref 8.9–10.3)
CHLORIDE: 107 mmol/L (ref 101–111)
CO2: 7 mmol/L — ABNORMAL LOW (ref 22–32)
CO2: 16 mmol/L — ABNORMAL LOW (ref 22–32)
CO2: 16 mmol/L — ABNORMAL LOW (ref 22–32)
CO2: 17 mmol/L — AB (ref 22–32)
CO2: 20 mmol/L — ABNORMAL LOW (ref 22–32)
CREATININE: 0.38 mg/dL — AB (ref 0.44–1.00)
Calcium: 9.1 mg/dL (ref 8.9–10.3)
Calcium: 8.8 mg/dL — ABNORMAL LOW (ref 8.9–10.3)
Chloride: 106 mmol/L (ref 101–111)
Chloride: 110 mmol/L (ref 101–111)
Chloride: 108 mmol/L (ref 101–111)
Chloride: 111 mmol/L (ref 101–111)
Creatinine, Ser: 0.95 mg/dL (ref 0.44–1.00)
Creatinine, Ser: 0.68 mg/dL (ref 0.44–1.00)
Creatinine, Ser: 0.54 mg/dL (ref 0.44–1.00)
Creatinine, Ser: 0.52 mg/dL (ref 0.44–1.00)
GFR calc Af Amer: >60 mL/min (ref >60)
GFR calc Af Amer: >60 mL/min (ref >60)
GFR calc Af Amer: >60 mL/min (ref >60)
GFR calc non Af Amer: >60 mL/min (ref >60)
GLUCOSE: 317 mg/dL — AB (ref 65–99)
Glucose, Bld: 430 mg/dL — ABNORMAL HIGH (ref 65–99)
Glucose, Bld: 234 mg/dL — ABNORMAL HIGH (ref 65–99)
Glucose, Bld: 267 mg/dL — ABNORMAL HIGH (ref 65–99)
Glucose, Bld: 107 mg/dL — ABNORMAL HIGH (ref 65–99)
POTASSIUM: 5.1 mmol/L (ref 3.5–5.1)
POTASSIUM: 3.7 mmol/L (ref 3.5–5.1)
POTASSIUM: 3.6 mmol/L (ref 3.5–5.1)
POTASSIUM: 3.8 mmol/L (ref 3.5–5.1)
Potassium: 3.2 mmol/L — ABNORMAL LOW (ref 3.5–5.1)
SODIUM: 138 mmol/L (ref 135–145)
SODIUM: 136 mmol/L (ref 135–145)
SODIUM: 133 mmol/L — AB (ref 135–145)
Sodium: 135 mmol/L (ref 135–145)
Sodium: 138 mmol/L (ref 135–145)

## 2015-05-06 LAB — GLUCOSE, CAPILLARY
GLUCOSE-CAPILLARY: 103 mg/dL — AB (ref 65–99)
GLUCOSE-CAPILLARY: 110 mg/dL — AB (ref 65–99)
GLUCOSE-CAPILLARY: 111 mg/dL — AB (ref 65–99)
GLUCOSE-CAPILLARY: 156 mg/dL — AB (ref 65–99)
GLUCOSE-CAPILLARY: 170 mg/dL — AB (ref 65–99)
GLUCOSE-CAPILLARY: 174 mg/dL — AB (ref 65–99)
GLUCOSE-CAPILLARY: 201 mg/dL — AB (ref 65–99)
GLUCOSE-CAPILLARY: 231 mg/dL — AB (ref 65–99)
GLUCOSE-CAPILLARY: 240 mg/dL — AB (ref 65–99)
GLUCOSE-CAPILLARY: 241 mg/dL — AB (ref 65–99)
GLUCOSE-CAPILLARY: 241 mg/dL — AB (ref 65–99)
GLUCOSE-CAPILLARY: 287 mg/dL — AB (ref 65–99)
GLUCOSE-CAPILLARY: 365 mg/dL — AB (ref 65–99)
GLUCOSE-CAPILLARY: 373 mg/dL — AB (ref 65–99)
GLUCOSE-CAPILLARY: 97 mg/dL (ref 65–99)
Glucose-Capillary: 253 mg/dL — ABNORMAL HIGH (ref 65–99)
Glucose-Capillary: 101 mg/dL — ABNORMAL HIGH (ref 65–99)
Glucose-Capillary: 146 mg/dL — ABNORMAL HIGH (ref 65–99)
Glucose-Capillary: 111 mg/dL — ABNORMAL HIGH (ref 65–99)
Glucose-Capillary: 188 mg/dL — ABNORMAL HIGH (ref 65–99)
Glucose-Capillary: 223 mg/dL — ABNORMAL HIGH (ref 65–99)
Glucose-Capillary: 142 mg/dL — ABNORMAL HIGH (ref 65–99)
Glucose-Capillary: 163 mg/dL — ABNORMAL HIGH (ref 65–99)
Glucose-Capillary: 127 mg/dL — ABNORMAL HIGH (ref 65–99)

## 2015-05-06 LAB — MAGNESIUM: Magnesium: 1.9 mg/dL (ref 1.7–2.4)

## 2015-05-06 LAB — CREATININE, URINE, RANDOM: Creatinine, Urine: 54.31 mg/dL

## 2015-05-06 LAB — CBC
HEMATOCRIT: 42.1 % (ref 36.0–46.0)
HEMOGLOBIN: 13.2 g/dL (ref 12.0–15.0)
MCH: 30.1 pg (ref 26.0–34.0)
MCHC: 31.4 g/dL (ref 30.0–36.0)
MCV: 96.1 fL (ref 78.0–100.0)
Platelets: 126 10*3/uL — ABNORMAL LOW (ref 150–400)
RBC: 4.38 MIL/uL (ref 3.87–5.11)
RDW: 12.4 % (ref 11.5–15.5)
WBC: 14.2 10*3/uL — AB (ref 4.0–10.5)

## 2015-05-06 LAB — OSMOLALITY: Osmolality: 322 mOsm/kg — ABNORMAL HIGH (ref 275–300)

## 2015-05-06 LAB — HEPATIC FUNCTION PANEL
ALK PHOS: 95 U/L (ref 38–126)
ALT: 16 U/L (ref 14–54)
AST: 19 U/L (ref 15–41)
Albumin: 3.9 g/dL (ref 3.5–5.0)
TOTAL PROTEIN: 7.1 g/dL (ref 6.5–8.1)
Total Bilirubin: 0.2 mg/dL — ABNORMAL LOW (ref 0.3–1.2)

## 2015-05-06 LAB — PREGNANCY, URINE: Preg Test, Ur: NEGATIVE

## 2015-05-06 LAB — BETA-HYDROXYBUTYRIC ACID: Beta-Hydroxybutyric Acid: 0.76 mmol/L — ABNORMAL HIGH (ref 0.05–0.27)

## 2015-05-06 LAB — MRSA PCR SCREENING: MRSA BY PCR: NEGATIVE

## 2015-05-06 LAB — SODIUM, URINE, RANDOM: Sodium, Ur: 86 mmol/L

## 2015-05-06 MED ORDER — SODIUM CHLORIDE 0.9 % IV SOLN
INTRAVENOUS | Status: DC
  Administered 2015-05-06: 3.1 [IU]/h via INTRAVENOUS
  Filled 2015-05-06: qty 2.5

## 2015-05-06 MED ORDER — ONDANSETRON HCL 4 MG/2ML IJ SOLN
4.0000 mg | Freq: Four times a day (QID) | INTRAMUSCULAR | Status: DC | PRN
  Administered 2015-05-06 – 2015-05-07 (×4): 4 mg via INTRAVENOUS
  Filled 2015-05-06 (×4): qty 2

## 2015-05-06 MED ORDER — SODIUM CHLORIDE 0.9 % IV SOLN
INTRAVENOUS | Status: DC
  Administered 2015-05-06: 02:00:00 via INTRAVENOUS

## 2015-05-06 MED ORDER — MORPHINE SULFATE (PF) 4 MG/ML IV SOLN
4.0000 mg | INTRAVENOUS | Status: DC | PRN
  Administered 2015-05-06 – 2015-05-08 (×9): 4 mg via INTRAVENOUS
  Filled 2015-05-06 (×9): qty 1

## 2015-05-06 MED ORDER — PANCRELIPASE (LIP-PROT-AMYL) 12000-38000 UNITS PO CPEP
12000.0000 [IU] | ORAL_CAPSULE | Freq: Three times a day (TID) | ORAL | Status: DC
  Administered 2015-05-06 – 2015-05-08 (×6): 12000 [IU] via ORAL
  Filled 2015-05-06 (×6): qty 1

## 2015-05-06 MED ORDER — SODIUM CHLORIDE 0.9 % IJ SOLN
3.0000 mL | Freq: Two times a day (BID) | INTRAMUSCULAR | Status: DC
  Administered 2015-05-06 – 2015-05-08 (×4): 3 mL via INTRAVENOUS

## 2015-05-06 MED ORDER — PANTOPRAZOLE SODIUM 40 MG PO TBEC: 40.0000 mg | DELAYED_RELEASE_TABLET | Freq: Every day | ORAL | Status: DC | PRN

## 2015-05-06 MED ORDER — DEXTROSE-NACL 5-0.45 % IV SOLN
INTRAVENOUS | Status: DC
  Administered 2015-05-06 (×2): via INTRAVENOUS

## 2015-05-06 MED ORDER — SODIUM CHLORIDE 0.9 % IV SOLN: INTRAVENOUS | Status: DC

## 2015-05-06 MED ORDER — ENOXAPARIN SODIUM 40 MG/0.4ML ~~LOC~~ SOLN
40.0000 mg | SUBCUTANEOUS | Status: DC
  Administered 2015-05-06 – 2015-05-08 (×3): 40 mg via SUBCUTANEOUS
  Filled 2015-05-06 (×3): qty 0.4

## 2015-05-06 MED ORDER — FERROUS SULFATE 325 (65 FE) MG PO TABS
325.0000 mg | ORAL_TABLET | Freq: Every day | ORAL | Status: DC
  Administered 2015-05-06 – 2015-05-08 (×3): 325 mg via ORAL
  Filled 2015-05-06 (×3): qty 1

## 2015-05-06 MED ORDER — OXYCODONE-ACETAMINOPHEN 7.5-325 MG PO TABS
1.0000 | ORAL_TABLET | Freq: Two times a day (BID) | ORAL | Status: DC | PRN
  Administered 2015-05-06 – 2015-05-07 (×2): 1 via ORAL
  Filled 2015-05-06 (×2): qty 1

## 2015-05-06 NOTE — Progress Notes (Signed)
CRITICAL VALUE ALERT  Critical value received:  CO2 7  Date of notification:  05/06/15  Time of notification:  0125  Critical value read back:Yes.    Nurse who received alert:  SR Hazelene Doten, RN  MD notified (1st page):  C Danford  Time of first page: 0125  MD notified (2nd page):  Time of second page:  Responding MD:  Jackqulyn Livings  Time MD responded:  0126  Came and assessed the patient with no new orders entered at this time

## 2015-05-06 NOTE — ED Notes (Signed)
Dr .Audie Pinto on room for assessment and he states to not hang any meds at this time.

## 2015-05-06 NOTE — H&P (Signed)
History and Physical  Patient Name: Linda Walls     DDU:[id number]    DOB: February 08, 1991    DOA: 05/05/2015 Referring physician: Virgel Manifold, MD PCP: [name], MD      Chief Complaint: Nausea/vomiting  HPI: Linda Walls is a 24 y.o. female with a past medical history significant for T1DM c/b frequent DKA and chronic pancreatitis who presents with nausea/vomiting for one day.  The patient was in similar condition (24-hour history of nausea vomiting and abdominal pain with elevated anion gap, hyperglycemia, and normal lipase, and slightly elevated total bilirubin) and had been recovering uneventfully at home for the last 2 weeks when last night she developed acute NBNB emesis.  This morning she woke up with blood sugar 5 35 mg/dL which she tried to correct with every 2 hour correction insulin, but her vomiting continued so her parents brought her to the ER.  In the ED, the patient was tachycardic but afebrile. She had hyperglycemia, bicarbonate of 14 and anion gap of 20, and her serum creatinine was 1.23 mg/dL from a baseline of 0.5 mg/dL.  There was leukocytosis but no elevation in lactic acid or lipase. Transaminases and bilirubin were normal but the alkaline phosphatase was minimally elevated. Urinalysis showed ketones and glucose. A CT of the abdomen and pelvis with contrast showed no abnormalities of the liver or pancreas.  She reported good insulin compliance. She had normal by mouth intake until vomiting started last night, denied alcohol use, has started no new medications recently, and had no preceding symptoms of cough or cold, dysuria or urinary urgency.  She was admitted for DKA.     Review of Systems:  Pt complains of abdominal pain in the epigastrium, nausea, vomiting, feeling weak. Pt denies any remaining cold symptoms, dysuria, hematuria, urinary urgency, rashes or skin sores.  All other systems negative except as just noted or noted in the history of present  illness.   Allergies:  Shellfish, cephalosporins, sulfa, penicillins  Home medications: 1. Omeprazole 20 mg daily as needed for heartburn 2. Oxycodone acetaminophen 7.5-325 mg as needed for pain 3. Sumatriptan 50 mg by mouth as needed for headache 4. Insulin U3 100 Tuojeo 30 units nightly 5. NovoLog 12 units TID AC plus sliding scale corrections 6. Ferrous sulfate 325 mg daily 7. Hyoscyamine 0.125 mg sublingually as needed for abdominal pain 8. Metoclopramide 5 mg as needed for nausea 9. Phenergan suppository as needed for nausea 10. Pancreas enzymes 3 times daily with meals  Past medical history: 1. Type 1 diabetes 2. GERD  3. 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 Pancreatic cyst drainage as 24 year old 2. Bile duct stent, age 49 for choledocholithiasis 3. Hernia repair   Family history: Father, her to try pancreatitis, hypertension, GERD. Maternal aunt, breast cancer. Maternal grandfather, prostate cancer, Alzheimer's, congenital heart disease.   Social History: Patient lives with her mother and father. She is independent with all ADLs. She does not smoke or drink alcohol.       Physical Exam: BP 114/63 mmHg  Pulse 118  Temp(Src) 97.6 F (36.4 C) (Oral)  Resp 18  Ht 5\' 2"  (1.575 m)  Wt 49.896 kg (110 lb)  BMI 20.11 kg/m2  SpO2 100%  LMP 04/02/2015 (Approximate) General appearance: Thin adult female, alert and in mild distress from pain and nausea.   Eyes: Anicteric, conjunctiva pink, lids and lashes normal.     ENT: No nasal deformity, discharge, or epistaxis.  Broken tooth.  OP dry without lesions.   Lymph: No cervical, supraclavicular or axillary lymphadenopathy. Skin: Warm and dry.  No jaundice.  No suspicious rashes or lesions. Cardiac: Tachycardic, regular, nl S1-S2, no murmurs appreciated.  Capillary refill is brisk. No LE edema.  Radial and DP pulses 2+ and symmetric. Respiratory: Normal respiratory rate and rhythm.  CTAB  without rales or wheezes. Abdomen: Abdomen soft without rigidity.  Mild epigastric TTP. No ascites, distension.   MSK: No deformities or effusions. Neuro: Sensorium intact and responding to questions, attention normal.  Speech is fluent.  Moves all extremities equally and with normal coordination.    Psych: Behavior appropriate.  Affect normal.  No evidence of aural or visual hallucinations or delusions.       Labs on Admission:  The metabolic panel shows normal sodium, potassium. Bicarbonate 14 mmol/L. Serum creatinine 1.23 mg/dL from a baseline of 0.5 mg/dL. Blood sugar between 166 and 290 mg/dL in the ER. Anion gap 20 Transaminases and bilirubin are normal. alkaline phosphatase is mildly elevated. The complete blood count shows leukocytosis 18.2K/uL. Normal hemoglobin and platelet count. The lactic acid level is normal. The lipase is normal.   Radiological Exams on Admission: Ct Abdomen Pelvis W Contrast 05/05/2015  IMPRESSION: No acute findings are evident in the abdomen or pelvis. Generous colonic stool volume. Otherwise unremarkable.          Assessment/Plan 1. Type 1 diabetes with DKA:  This is new.  Inciting factor not entirely clear.  Emesis started last night before hyperglycemia, the patient reports adherence to insulin use and blood sugars in the 120-180 range until this morning.  -Admit to stepdown -Insulin gtt and IVF -Every 4 hours BMP -Check mag -Hold potassium supplementation for now, given family has been correcting with insulin at home before presentation, monitor -Restart glargine once the gap is closed  2. AKI: Suspect prerenal azotemia. -Urine electrolytes -Fluid resuscitation -Recheck BMP  3. Leukocytosis:  This is new.  Presumed to be reactive. -Repeat CBC tomorrow  4. Elevated alkaline phosphatase:  I presume this is to be expected in a patient with chronic pancreatitis and vomiting. The patient had an centrally normal right upper quadrant  ultrasound and HIDA scan 1 year ago. -Repeat LFT and ultrasound if rising  5. GERD:  -Continue home PPI  6. Chronic pancreatitis:  Stable.  -Continue pancreatic enzymes with meals when able to eat -Morphine when necessary for pain -Zofran IV PRN for nausea     DVT PPx: Enoxaparin Diet: NPO Code Status: Full Family Communication: Mother and father at bedside  Medical decision making: What exists of the patient's previous chart was reviewed in depth and the case was discussed with Dr. Wilson Singer. Patient seen 12:44 AM on 05/06/2015.  Disposition Plan:  Admit to step down for IVF, insulin gtt and correction of anion gap. Expect admission for 2-3 days.      Edwin Dada [facility] Pager [phone]

## 2015-05-06 NOTE — Progress Notes (Signed)
Patient seen and examined. Discussed care with father at bedside. Admitted earlier this am with DKA. As per last BMET at 4:30 am AG has closed but CO2 still low at 16. Continue IV insulin for now. Wait for CO2 to be closer to 20 before transitioning off drip. Will continue to follow.  Domingo Mend, MD [facility] Pager: [phone]

## 2015-05-07 LAB — BASIC METABOLIC PANEL
Anion gap: 6 (ref 5–15)
Anion gap: 7 (ref 5–15)
BUN: 5 mg/dL — ABNORMAL LOW (ref 6–20)
BUN: 7 mg/dL (ref 6–20)
CALCIUM: 9.1 mg/dL (ref 8.9–10.3)
CHLORIDE: 108 mmol/L (ref 101–111)
CHLORIDE: 113 mmol/L — AB (ref 101–111)
CO2: 19 mmol/L — AB (ref 22–32)
CO2: 20 mmol/L — AB (ref 22–32)
CREATININE: 0.36 mg/dL — AB (ref 0.44–1.00)
CREATININE: 0.49 mg/dL (ref 0.44–1.00)
Calcium: 8.6 mg/dL — ABNORMAL LOW (ref 8.9–10.3)
GFR calc Af Amer: >60 mL/min (ref >60)
GFR calc non Af Amer: >60 mL/min (ref >60)
Glucose, Bld: 272 mg/dL — ABNORMAL HIGH (ref 65–99)
Glucose, Bld: 92 mg/dL (ref 65–99)
Potassium: 3.3 mmol/L — ABNORMAL LOW (ref 3.5–5.1)
Potassium: 3.3 mmol/L — ABNORMAL LOW (ref 3.5–5.1)
Sodium: 134 mmol/L — ABNORMAL LOW (ref 135–145)
Sodium: 139 mmol/L (ref 135–145)

## 2015-05-07 LAB — GLUCOSE, CAPILLARY
GLUCOSE-CAPILLARY: 122 mg/dL — AB (ref 65–99)
GLUCOSE-CAPILLARY: 194 mg/dL — AB (ref 65–99)
GLUCOSE-CAPILLARY: 197 mg/dL — AB (ref 65–99)
Glucose-Capillary: 136 mg/dL — ABNORMAL HIGH (ref 65–99)
Glucose-Capillary: 71 mg/dL (ref 65–99)
Glucose-Capillary: 57 mg/dL — ABNORMAL LOW (ref 65–99)
Glucose-Capillary: 200 mg/dL — ABNORMAL HIGH (ref 65–99)
Glucose-Capillary: 76 mg/dL (ref 65–99)

## 2015-05-07 MED ORDER — INSULIN ASPART 100 UNIT/ML ~~LOC~~ SOLN: 0.0000 [IU] | Freq: Three times a day (TID) | SUBCUTANEOUS | Status: DC

## 2015-05-07 MED ORDER — POTASSIUM CHLORIDE CRYS ER 20 MEQ PO TBCR
40.0000 meq | EXTENDED_RELEASE_TABLET | Freq: Once | ORAL | Status: AC
  Administered 2015-05-07: 40 meq via ORAL
  Filled 2015-05-07: qty 2

## 2015-05-07 MED ORDER — INSULIN ASPART 100 UNIT/ML ~~LOC~~ SOLN: 0.0000 [IU] | Freq: Every day | SUBCUTANEOUS | Status: DC

## 2015-05-07 MED ORDER — INSULIN GLARGINE 100 UNIT/ML ~~LOC~~ SOLN
10.0000 [IU] | Freq: Every day | SUBCUTANEOUS | Status: DC
  Administered 2015-05-07: 10 [IU] via SUBCUTANEOUS
  Filled 2015-05-07: qty 0.1

## 2015-05-07 MED ORDER — INSULIN GLARGINE 100 UNIT/ML ~~LOC~~ SOLN
25.0000 [IU] | Freq: Every day | SUBCUTANEOUS | Status: DC
  Administered 2015-05-07: 25 [IU] via SUBCUTANEOUS
  Filled 2015-05-07 (×2): qty 0.25

## 2015-05-07 MED ORDER — INSULIN ASPART 100 UNIT/ML ~~LOC~~ SOLN
6.0000 [IU] | Freq: Three times a day (TID) | SUBCUTANEOUS | Status: DC
  Administered 2015-05-07 – 2015-05-08 (×2): 6 [IU] via SUBCUTANEOUS

## 2015-05-07 MED ORDER — INSULIN ASPART 100 UNIT/ML ~~LOC~~ SOLN
0.0000 [IU] | Freq: Three times a day (TID) | SUBCUTANEOUS | Status: DC
  Administered 2015-05-07 – 2015-05-08 (×3): 2 [IU] via SUBCUTANEOUS

## 2015-05-07 MED ORDER — INSULIN ASPART 100 UNIT/ML ~~LOC~~ SOLN
10.0000 [IU] | Freq: Three times a day (TID) | SUBCUTANEOUS | Status: DC
  Administered 2015-05-07: 10 [IU] via SUBCUTANEOUS

## 2015-05-07 MED ORDER — SODIUM CHLORIDE 0.9 % IV SOLN
INTRAVENOUS | Status: DC
  Administered 2015-05-07 (×2): via INTRAVENOUS

## 2015-05-07 MED ORDER — INSULIN GLARGINE 100 UNIT/ML ~~LOC~~ SOLN
30.0000 [IU] | Freq: Every day | SUBCUTANEOUS | Status: DC
  Filled 2015-05-07: qty 0.3

## 2015-05-07 MED ORDER — PROCHLORPERAZINE EDISYLATE 5 MG/ML IJ SOLN
10.0000 mg | Freq: Once | INTRAMUSCULAR | Status: AC
  Administered 2015-05-07: 10 mg via INTRAVENOUS
  Filled 2015-05-07 (×2): qty 2

## 2015-05-07 MED ORDER — METOCLOPRAMIDE HCL 10 MG PO TABS: 5.0000 mg | ORAL_TABLET | Freq: Four times a day (QID) | ORAL | Status: DC | PRN

## 2015-05-07 NOTE — Progress Notes (Signed)
Report called to B. Harlow Mares, RN. Patient transferred to 301 in stable condition with NT.

## 2015-05-07 NOTE — Care Management Note (Signed)
Case Management Note  Patient Details  Name: CALISE DUNCKEL MRN: [id number] Date of Birth: [birth year]  Subjective/Objective:                  Pt is from home, lives with parents and is ind at baseline. Pt has no HH services or DME needs prior to admission.   Action/Plan: Pt plans to return home with self care at DC. Pt has no CM needs.   Expected Discharge Date:  05/09/15               Expected Discharge Plan:  Home/Self Care  In-House Referral:  NA  Discharge planning Services  CM Consult  Post Acute Care Choice:  NA Choice offered to:  NA  DME Arranged:    DME Agency:     HH Arranged:    HH Agency:     Status of Service:  Completed, signed off  Medicare Important Message Given:    Date Medicare IM Given:    Medicare IM give by:    Date Additional Medicare IM Given:    Additional Medicare Important Message give by:     If discussed at [place] of Stay Meetings, dates discussed:    Additional Comments:  Sherald Barge, RN 05/07/2015, 1:12 PM

## 2015-05-07 NOTE — Progress Notes (Signed)
[facility]                                                                              Patient Demographics  Linda Walls, is a 24 y.o. female, DOB - [birth year], EUM:[id number]  Admit date - 05/05/2015   Admitting Physician Edwin Dada, MD  Outpatient Primary MD for the patient is [name], MD  LOS - 1   Chief Complaint  Patient presents with  . Emesis       Brief HPI   Linda Walls is a 24 y.o. female with a past medical history significant for T1DM with frequent DKA and chronic pancreatitis who presented with nausea/vomiting for one day.  Patient was found to be tachycardiac in the ED, hyperglycemia, bicarbonate of 14, anion gap of 20, serum creatinine 1.23. Alkaline phosphatase minimally elevated, UA with ketones and glucose.   patient had recent 2 admissions, discharged on 10/18 with DKA and admitted in 8/16 with DKA She was admitted for DKA.   Assessment & Plan    Principal Problem:   Diabetic ketoacidosis without coma associated with type 1 diabetes mellitus (HCC) - Anion gap closed, bicarbonate 20, patient transitioned to Lantus and sliding scale insulin this morning. Placed on NovoLog 10 units TID WC, sensitive sliding scale, inc Lantus 30 units at bedtime per home regimen - IV insulin off - Started on solids, diabetic coordinator consult, nutrition consult - Discussed in detail with the patient and the mother at the bedside,who requested to monitor BS today and if continues to improve, DC in a.m. Will transfer to floor today  Active Problems: Acute kidney injury: Creatinine 1.23 at the time of admission - Improved with IV fluids, suspect prerenal azotemia    Hereditary pancreatitis - Started on diet today, continue pancreatic enzymes with meals, IV Zofran and Reglan as needed    DM type 1 (diabetes mellitus, type 1) ([place]) - Patient follows endocrinologist, Dr. Dorris Fetch. Will follow CBGs today and adjust insulin regimen  accordingly.  GERD Continue PPI  Elevated alkaline phosphatase - Likely due to DKA and chronic pancreatitis, resolved  Code Status:  full CODE STATUS  Family Communication: Discussed in detail with the patient, all imaging results, lab results explained to the paand mother at the bedside   Disposition Plan: per patient and mother's request, DC in a.m., transfer to the floor today  Time Spent in minutes   25 minutes  Procedures  None   Consults   None   DVT Prophylaxis Lovenox  Medications  Scheduled Meds: . enoxaparin (LOVENOX) injection  40 mg Subcutaneous Q24H  . ferrous sulfate  325 mg Oral Q breakfast  . insulin aspart  0-5 Units Subcutaneous QHS  . insulin aspart  0-9 Units Subcutaneous TID WC  . insulin aspart  10 Units Subcutaneous TID WC  . insulin glargine  30 Units Subcutaneous QHS  . lipase/protease/amylase  12,000 Units Oral TID AC  . sodium chloride  3 mL Intravenous Q12H   Continuous Infusions: . sodium chloride 75 mL/hr at 05/07/15 0414   PRN Meds:.metoCLOPramide, morphine injection, ondansetron, oxyCODONE-acetaminophen, pantoprazole   Antibiotics   Anti-infectives  None        Subjective:   Linda Walls was seen and examined today. feeling a lot better, nausea improving, no vomiting. Insulin drip transitioned off.   Patient denies dizziness, chest pain, shortness of breath, abdominal pain,  new weakness, numbess, tingling. No acute events overnight.    Objective:   Blood pressure 108/68, pulse 92, temperature 98.4 F (36.9 C), temperature source Oral, resp. rate 17, height 5\' 2"  (1.575 m), weight 49.5 kg (109 lb 2 oz), last menstrual period 04/02/2015, SpO2 99 %.  Wt Readings from Last 3 Encounters:  05/07/15 49.5 kg (109 lb 2 oz)  04/17/15 49.1 kg (108 lb 3.9 oz)  02/22/15 47.628 kg (105 lb)     Intake/Output Summary (Last 24 hours) at 05/07/15 1018 Last data filed at 05/07/15 0600  Gross per 24 hour  Intake 4308.38 ml    Output   1500 ml  Net 2808.38 ml    Exam  General: Alert and oriented x 3, NAD  HEENT:  PERRLA, EOMI, Anicteric Sclera, mucous membranes moist.   Neck: Supple, no JVD, no masses  CVS: S1 S2 auscultated, no rubs, murmurs or gallops. Regular rate and rhythm.  Respiratory: Clear to auscultation bilaterally, no wheezing, rales or rhonchi  Abdomen: Soft, nontender, nondistended, + bowel sounds  Ext: no cyanosis clubbing or edema  Neuro: AAOx3, Cr N's II- XII. Strength 5/5 upper and lower extremities bilaterally  Skin: No rashes  Psych: Normal affect and demeanor, alert and oriented x3    Data Review   Micro Results Recent Results (from the past 240 hour(s))  MRSA PCR Screening     Status: None   Collection Time: 05/06/15  1:50 AM  Result Value Ref Range Status   MRSA by PCR NEGATIVE NEGATIVE Final    Comment:        The GeneXpert MRSA Assay (FDA approved for NASAL specimens only), is one component of a comprehensive MRSA colonization surveillance program. It is not intended to diagnose MRSA infection nor to guide or monitor treatment for MRSA infections.     Radiology Reports Ct Abdomen Pelvis W Contrast  05/05/2015  CLINICAL DATA:  Nausea and vomiting, onset last night. Sharp left lower quadrant abdominal pain. EXAM: CT ABDOMEN AND PELVIS WITH CONTRAST TECHNIQUE: Multidetector CT imaging of the abdomen and pelvis was performed using the standard protocol following bolus administration of intravenous contrast. CONTRAST:  61mL OMNIPAQUE IOHEXOL 300 MG/ML SOLN, 11mL OMNIPAQUE IOHEXOL 300 MG/ML SOLN COMPARISON:  04/01/2013 FINDINGS: Lower chest:  No significant abnormality Hepatobiliary: There are normal appearances of the liver, gallbladder and bile ducts. Pancreas: Normal Spleen: Normal Adrenals/Urinary Tract: The adrenals and kidneys are normal in appearance. There is no urinary calculus evident. There is no hydronephrosis or ureteral dilatation. Collecting systems and  ureters appear unremarkable. Stomach/Bowel: The stomach, small bowel and colon are unremarkable. There is no bowel obstruction. There is no focal inflammatory change. Generous volume colonic stool. Vascular/Lymphatic: The abdominal aorta is normal in caliber. There is no atherosclerotic calcification. There is no adenopathy in the abdomen or pelvis. Reproductive: Uterus and ovaries appear normal. Other: No ascites. Musculoskeletal: No significant abnormality. IMPRESSION: No acute findings are evident in the abdomen or pelvis. Generous colonic stool volume. Otherwise unremarkable. Electronically Signed   By: Andreas Newport M.D.   On: 05/05/2015 23:26    CBC  Recent Labs Lab 05/05/15 2053 05/06/15 0137  WBC 18.2* 14.2*  HGB 14.8 13.2  HCT 44.5 42.1  PLT 513* 126*  MCV 91.2 96.1  MCH 30.3 30.1  MCHC 33.3 31.4  RDW 12.2 12.4  LYMPHSABS 2.0  --   MONOABS 1.2*  --   EOSABS 0.0  --   BASOSABS 0.0  --     Chemistries   Recent Labs Lab 05/05/15 2053  05/06/15 0437 05/06/15 0923 05/06/15 1532 05/06/15 1944 05/06/15 2334 05/07/15 0312  NA 141  < > 136 133* 135 138 134* 139  K 4.4  < > 3.7 3.6 3.8 3.2* 3.3* 3.3*  CL 107  < > 110 108 107 111 108 113*  CO2 14*  < > 16* 16* 17* 20* 19* 20*  GLUCOSE 166*  < > 234* 267* 317* 107* 272* 92  BUN 21*  < > 13 12 10 7 7  5*  CREATININE 1.23*  < > 0.68 0.54 0.52 0.38* 0.49 0.36*  CALCIUM 9.9  < > 9.3 8.7* 8.8* 9.0 8.6* 9.1  MG  --   --  1.9  --   --   --   --   --   AST 26  --  19  --   --   --   --   --   ALT 20  --  16  --   --   --   --   --   ALKPHOS 136*  --  95  --   --   --   --   --   BILITOT 1.2  --  0.2*  --   --   --   --   --   < > = values in this interval not displayed. ------------------------------------------------------------------------------------------------------------------ estimated creatinine clearance is 86.2 mL/min (by C-G formula based on Cr of  0.36). ------------------------------------------------------------------------------------------------------------------ No results for input(s): HGBA1C in the last 72 hours. ------------------------------------------------------------------------------------------------------------------ No results for input(s): CHOL, HDL, LDLCALC, TRIG, CHOLHDL, LDLDIRECT in the last 72 hours. ------------------------------------------------------------------------------------------------------------------ No results for input(s): TSH, T4TOTAL, T3FREE, THYROIDAB in the last 72 hours.  Invalid input(s): FREET3 ------------------------------------------------------------------------------------------------------------------ No results for input(s): VITAMINB12, FOLATE, FERRITIN, TIBC, IRON, RETICCTPCT in the last 72 hours.  Coagulation profile No results for input(s): INR, PROTIME in the last 168 hours.  No results for input(s): DDIMER in the last 72 hours.  Cardiac Enzymes No results for input(s): CKMB, TROPONINI, MYOGLOBIN in the last 168 hours.  Invalid input(s): CK ------------------------------------------------------------------------------------------------------------------ Invalid input(s): Linda Walls  05/06/15 2232 05/06/15 2334 05/07/15 0035 05/07/15 0152 05/07/15 0255 05/07/15 0716  GLUCAP 201* 241* 194* 136* 64 197*     Rateel Beldin M.D. [facility] 05/07/2015, 10:18 AM  Pager: [phone] Between 7am to 7pm - call Pager - [phone]  After 7pm go to [email or web address] - password [email or web address]  Call night coverage person covering after 7pm

## 2015-05-07 NOTE — Progress Notes (Addendum)
Inpatient Diabetes Program Recommendations  AACE/ADA: New Consensus Statement on Inpatient Glycemic Control (2015)  Target Ranges:  Prepandial:   less than 140 mg/dL      Peak postprandial:   less than 180 mg/dL (1-2 hours)      Critically ill patients:  140 - 180 mg/dL   Results for Linda Walls, Linda Walls (MRN [id number]) as of 05/07/2015 12:08  Ref. Range 05/06/2015 19:32 05/06/2015 20:40 05/06/2015 21:32 05/06/2015 22:32 05/06/2015 23:34 05/07/2015 00:35 05/07/2015 01:52 05/07/2015 02:55 05/07/2015 07:16 05/07/2015 11:35  Glucose-Capillary Latest Ref Range: 65-99 mg/dL 110 (H) 127 (H) 174 (H) 201 (H) 241 (H) 194 (H) 136 (H) 71 197 (H) 57 (L)   Review of Glycemic Control Diabetes history: DM1 Outpatient Diabetes medications: Toujeo 30 units QPM, Novolog 12 units TID with meals plus 1 unit for every 50 mg/dl above target of 150 mg/dl Current orders for Inpatient glycemic control: Novolog 0-15 units TID with meals, Novolog 0-5 units HS, Lantus 30 units QHS, Novolog 10 units TID with meals for meal coverage  Inpatient Diabetes Program Recommendations  Insulin - Basal: Please consider decreasing Lantus to 25 units QHS.  Correction (SSI): Patient has Type 1 diabetes and is very sensitive to insulin. Noted glucose down to 57 mg/dl today after receiving Novolog 12 units at 8:03 am this morning. Patient uses a 1 to 50 mg/dl sensitivity factor as an outpatient. Please consider decreasing Novolog correction scale to sensitive or may want to consider changing Novolog correction to a custom scale Novolog 0-5 units Q4H (150-200 give 1 unit, 201-250 give 2 units, 251-300 give 3 units, 301-350 give 4 units, 351-400 give 5 units, >401 call MD).  Insulin - Meal Coverage: May want to consider decreasing meal coverage to Novolog 6 units TID with meals.  NURSING: Please be sure patient eats first before receiving carb coverage.   05/07/15@13 :10-Diabetes Coordinator consulted for recurrent DKA and needs better outpatient  regimen. Spoke with patient and her mother about diabetes and home regimen for diabetes control. 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 Patient has had several hospital admissions this year for DKA which seems to be triggered when she has an acute flare up with pancreatitis. Patient is followed by Dr. Dorris Fetch (Endocrinologist) for diabetes management and currently she takes Toujeo 30 units QHS, Novolog 12 units TID with meals for meal coverage plus Novolog correction (1 unit for every 50 mg/dl above target glucose) as an outpatient for diabetes control. Patient's mother reports that she recently received a letter from her insurance notifying her that Nelva Nay would not be covered under her insurance starting July 01, 2015. Patient and her mother plan to discuss Toujeo with Dr. Dorris Fetch.  Patient has been seeing Dr. Dorris Fetch for 3 years and she reports that it has been difficult to get her A1C down. Patient states that she checks her glucose 4 times per day and it is usually less than 180 mg/dl. Patient states that she does not experience hypoglycemia very often.  Patient states that her last A1C was high despite her glucose trends in the 100's mg/dl for the most part. Talked with patient about continuous glucose monitoring and insulin pumps. Discussed how insulin pump along with continuous glucose monitoring could help her get her diabetes under control and how continuous glucose monitoring provides more data for the doctor to use when making insulin pump adjustments. Encouraged patient to talk with Dr.  Nida about the potential of going on an insulin pump and/or continuous glucose monitoring.  Patient verbalized understanding of information discussed and she states that she has no further questions at this time related to diabetes. Patient and her mother appear to be very knowledgeable about diabetes and report they are  seeing Dr. Dorris Fetch on a monthly basis trying to improve glycemic control.  Thanks, Barnie Alderman, RN, MSN, CCRN, CDE Diabetes Coordinator Inpatient Diabetes Program [phone] (Team Pager from [place] to [place]) [phone] ([facility] office) [phone] [facility] office)

## 2015-05-07 NOTE — Progress Notes (Signed)
Brief Nutrition Note  Consult received to provide DM education for pt with Type 1. Pt says she was diagnosed several years ago and is familiar with managing her diabetes. She has no additional diet related questions at this time. She is a pt of Dr Dorris Fetch and recommend she also follow up with Jearld Fenton RD at the [facility] for further nutrition education as indicated. Will be glad to follow up with her while she is in the hospital if needed.  Colman Cater MS,RD,CSG,LDN Office: [phone] Pager: [phone]

## 2015-05-07 NOTE — Progress Notes (Signed)
Hypoglycemic Event  CBG: 57  Treatment: 15 GM carbohydrate snack  Symptoms: None  Follow-up CBG: Time:1228 CBG Result:122  Possible Reasons for Event: Unknown  Comments/MD notified:Paged Dr Tana Coast to notify of event    Lynnda Shields

## 2015-05-08 DIAGNOSIS — K219 Gastro-esophageal reflux disease without esophagitis: Secondary | ICD-10-CM

## 2015-05-08 DIAGNOSIS — N179 Acute kidney failure, unspecified: Secondary | ICD-10-CM

## 2015-05-08 LAB — GLUCOSE, CAPILLARY
GLUCOSE-CAPILLARY: 76 mg/dL (ref 65–99)
GLUCOSE-CAPILLARY: 187 mg/dL — AB (ref 65–99)

## 2015-05-08 LAB — BASIC METABOLIC PANEL
Anion gap: 6 (ref 5–15)
CO2: 24 mmol/L (ref 22–32)
Calcium: 8.7 mg/dL — ABNORMAL LOW (ref 8.9–10.3)
Chloride: 113 mmol/L — ABNORMAL HIGH (ref 101–111)
Creatinine, Ser: 0.32 mg/dL — ABNORMAL LOW (ref 0.44–1.00)
Glucose, Bld: 97 mg/dL (ref 65–99)
POTASSIUM: 3.3 mmol/L — AB (ref 3.5–5.1)
SODIUM: 143 mmol/L (ref 135–145)

## 2015-05-08 LAB — HEMOGLOBIN A1C
Hgb A1c MFr Bld: 11.2 % — ABNORMAL HIGH (ref 4.8–5.6)
MEAN PLASMA GLUCOSE: 275 mg/dL

## 2015-05-08 MED ORDER — INSULIN ASPART 100 UNIT/ML ~~LOC~~ SOLN: 6.0000 [IU] | Freq: Three times a day (TID) | SUBCUTANEOUS | Status: DC

## 2015-05-08 MED ORDER — INSULIN GLARGINE 300 UNIT/ML ~~LOC~~ SOPN: 25.0000 [IU] | PEN_INJECTOR | Freq: Every day | SUBCUTANEOUS | Status: DC

## 2015-05-08 NOTE — Care Management Note (Signed)
Case Management Note  Patient Details  Name: Linda Walls MRN: [id number] Date of Birth: 1990-12-22  Subjective/Objective:                    Action/Plan: Pt discharging home today with self care. No CM needs.   Expected Discharge Date:  05/09/15               Expected Discharge Plan:  Home/Self Care  In-House Referral:  NA  Discharge planning Services  CM Consult  Post Acute Care Choice:  NA Choice offered to:  NA  DME Arranged:    DME Agency:     HH Arranged:    HH Agency:     Status of Service:  Completed, signed off  Medicare Important Message Given:    Date Medicare IM Given:    Medicare IM give by:    Date Additional Medicare IM Given:    Additional Medicare Important Message give by:     If discussed at [place] of Stay Meetings, dates discussed:    Additional Comments:  Sherald Barge, RN 05/08/2015, 12:21 PM

## 2015-05-08 NOTE — Progress Notes (Signed)
Pt IV and telemetry removed, tolerated well.  Reviewed discharge instructions with pt and answered all questions at this time.

## 2015-05-08 NOTE — Discharge Summary (Signed)
Physician Discharge Summary  Linda Walls QMG:[id number] DOB: 12/26/1990 DOA: 05/05/2015  PCP: [name], MD  Admit date: 05/05/2015 Discharge date: 05/08/2015  Time spent: 45 minutes  Recommendations for Outpatient Follow-up:  -Will be discharged home today. -Advised to follow up with Dr. Dorris Fetch in 1-2 weeks.   Discharge Diagnoses:  Principal Problem:   Diabetic ketoacidosis without coma associated with type 1 diabetes mellitus ([place]) Active Problems:   Hereditary pancreatitis   DM type 1 (diabetes mellitus, type 1) (HCC)   GERD (gastroesophageal reflux disease)   Nausea and vomiting   Migraine headache   DKA, type 1 ([place])   AKI (acute kidney injury) ([place])   Discharge Condition: Stable and improved  Filed Weights   05/05/15 1954 05/06/15 0117 05/07/15 0400  Weight: 49.896 kg (110 lb) 46.8 kg (103 lb 2.8 oz) 49.5 kg (109 lb 2 oz)    History of present illness:  As per Dr. Loleta Books 11/6: Linda Walls is a 24 y.o. female with a past medical history significant for T1DM c/b frequent DKA and chronic pancreatitis who presents with nausea/vomiting for one day.  The patient was in similar condition (24-hour history of nausea vomiting and abdominal pain with elevated anion gap, hyperglycemia, and normal lipase, and slightly elevated total bilirubin) and had been recovering uneventfully at home for the last 2 weeks when last night she developed acute NBNB emesis. This morning she woke up with blood sugar 5 35 mg/dL which she tried to correct with every 2 hour correction insulin, but her vomiting continued so her parents brought her to the ER.  In the ED, the patient was tachycardic but afebrile. She had hyperglycemia, bicarbonate of 14 and anion gap of 20, and her serum creatinine was 1.23 mg/dL from a baseline of 0.5 mg/dL. There was leukocytosis but no elevation in lactic acid or lipase. Transaminases and bilirubin were normal but the alkaline phosphatase was minimally  elevated. Urinalysis showed ketones and glucose. A CT of the abdomen and pelvis with contrast showed no abnormalities of the liver or pancreas. She reported good insulin compliance. She had normal by mouth intake until vomiting started last night, denied alcohol use, has started no new medications recently, and had no preceding symptoms of cough or cold, dysuria or urinary urgency.  She was admitted for DKA.   Hospital Course:   Diabetic ketoacidosis without coma associated with type 1 diabetes mellitus ([place]) - Resolved. -Since transitioning off drip, has had some hypoglycemic episodes. -Will decrease home dose of toujeo to 25 units and decrease her meal coverage from 12 to 6 units (this is what she has been receiving in the hospital). -She know how to increase her insulin at home if CBGs start to increase.  Acute kidney injury: Creatinine 1.23 at the time of admission - Resolved with IV fluids, suspect prerenal azotemia   Chronic pancreatitis - Started on diet today, continue pancreatic enzymes with meals.  GERD Continue PPI  Elevated alkaline phosphatase - Likely due to DKA and chronic pancreatitis, resolved  Procedures:  None   Consultations:  None  Discharge Instructions  Discharge Instructions    Diet Carb Modified    Complete by:  As directed      Increase activity slowly    Complete by:  As directed             Medication List    TAKE these medications        ferrous sulfate 325 (65 FE) MG tablet  Take 325 mg by mouth daily with breakfast.     GLUCAGON EMERGENCY 1 MG injection  Generic drug:  glucagon  Inject 1 mg as directed once.     hyoscyamine 0.125 MG SL tablet  Commonly known as:  LEVSIN SL  PLACE 1 TABLET UNDER THE TONGUE EVERY 4 HOURS AS NEEDED FOR PAIN.     ibuprofen 200 MG tablet  Commonly known as:  ADVIL,MOTRIN  Take 400 mg by mouth every 6 (six) hours as needed. pain     insulin aspart 100 UNIT/ML injection  Commonly known as:   novoLOG  Inject 6 Units into the skin 3 (three) times daily before meals. BE AWARE OF LOW BLOOD SUGARS AND DO NOT TAKE IF YOUR BLOOD SUGAR IS 110 OR BELOW  *Increase subcutaneous injection by 1 unit for every 50 units over 150.*     Insulin Glargine 300 UNIT/ML Sopn  Commonly known as:  TOUJEO SOLOSTAR  Inject 25 Units into the skin daily.     metoCLOPramide 5 MG tablet  Commonly known as:  REGLAN  Take 1 tablet (5 mg total) by mouth every 8 (eight) hours as needed for nausea.     multivitamins ther. w/minerals Tabs tablet  Take 1 tablet by mouth daily. For energy     omeprazole 20 MG capsule  Commonly known as:  PRILOSEC  Take 1 capsule (20 mg total) by mouth daily.     ondansetron 4 MG disintegrating tablet  Commonly known as:  ZOFRAN-ODT  DISSOLVE 1 TABLET BY MOUTH EVERY 8 HOURS AS NEEDED FOR NAUSEA OR VOMITING.     oxyCODONE-acetaminophen 7.5-325 MG tablet  Commonly known as:  PERCOCET  Take one tablet up to BID prn pain     Pancrelipase (Lip-Prot-Amyl) 6000 UNITS Cpep  Take 2 capsules (12,000 Units total) by mouth 3 (three) times daily before meals.     promethazine 25 MG tablet  Commonly known as:  PHENERGAN  TAKE 1 TABLET BY MOUTH EVERY 6 HOURS AS NEEDED FOR NAUSEA.     promethazine 25 MG suppository  Commonly known as:  PHENERGAN  Place 25 mg rectally every 6 (six) hours as needed.     SUMAtriptan 50 MG tablet  Commonly known as:  IMITREX  TAKE 1 TABLET BY MOUTH ONCE. MAY REPEAT IN 2 HOURS IF HEADACHE PERSISTS OR RECURS.     vitamin C 500 MG tablet  Commonly known as:  ASCORBIC ACID  Take 500 mg by mouth daily.       Allergies  Allergen Reactions  . Shellfish Allergy Nausea And Vomiting  . Cefzil [Cefprozil] Rash  . Penicillins Rash    Has patient had a PCN reaction causing immediate rash, facial/tongue/throat swelling, SOB or lightheadedness with hypotension: NO Has patient had a PCN reaction causing severe rash involving mucus membranes or skin necrosis:  NO Has patient had a PCN reaction that required hospitalization: NO Has patient had a PCN reaction occurring within the last 10 years: NO If all of the above answers are "NO", then may proceed with Cephalosporin use.   . Sulfa Antibiotics Rash       Follow-up Information    Follow up with Glade Lloyd, MD. Schedule an appointment as soon as possible for a visit in 2 weeks.   Specialty:  Endocrinology   Contact information:   [place] [address] [phone]        The results of significant diagnostics from this hospitalization (including imaging, microbiology, ancillary  and laboratory) are listed below for reference.    Significant Diagnostic Studies: Ct Abdomen Pelvis W Contrast  05/05/2015  CLINICAL DATA:  Nausea and vomiting, onset last night. Sharp left lower quadrant abdominal pain. EXAM: CT ABDOMEN AND PELVIS WITH CONTRAST TECHNIQUE: Multidetector CT imaging of the abdomen and pelvis was performed using the standard protocol following bolus administration of intravenous contrast. CONTRAST:  14mL OMNIPAQUE IOHEXOL 300 MG/ML SOLN, 129mL OMNIPAQUE IOHEXOL 300 MG/ML SOLN COMPARISON:  04/01/2013 FINDINGS: Lower chest:  No significant abnormality Hepatobiliary: There are normal appearances of the liver, gallbladder and bile ducts. Pancreas: Normal Spleen: Normal Adrenals/Urinary Tract: The adrenals and kidneys are normal in appearance. There is no urinary calculus evident. There is no hydronephrosis or ureteral dilatation. Collecting systems and ureters appear unremarkable. Stomach/Bowel: The stomach, small bowel and colon are unremarkable. There is no bowel obstruction. There is no focal inflammatory change. Generous volume colonic stool. Vascular/Lymphatic: The abdominal aorta is normal in caliber. There is no atherosclerotic calcification. There is no adenopathy in the abdomen or pelvis. Reproductive: Uterus and ovaries appear normal. Other: No ascites. Musculoskeletal:  No significant abnormality. IMPRESSION: No acute findings are evident in the abdomen or pelvis. Generous colonic stool volume. Otherwise unremarkable. Electronically Signed   By: Andreas Newport M.D.   On: 05/05/2015 23:26    Microbiology: Recent Results (from the past 240 hour(s))  MRSA PCR Screening     Status: None   Collection Time: 05/06/15  1:50 AM  Result Value Ref Range Status   MRSA by PCR NEGATIVE NEGATIVE Final    Comment:        The GeneXpert MRSA Assay (FDA approved for NASAL specimens only), is one component of a comprehensive MRSA colonization surveillance program. It is not intended to diagnose MRSA infection nor to guide or monitor treatment for MRSA infections.      Labs: Basic Metabolic Panel:  Recent Labs Lab 05/06/15 0437  05/06/15 1532 05/06/15 1944 05/06/15 2334 05/07/15 0312 05/08/15 0522  NA 136  < > 135 138 134* 139 143  K 3.7  < > 3.8 3.2* 3.3* 3.3* 3.3*  CL 110  < > 107 111 108 113* 113*  CO2 16*  < > 17* 20* 19* 20* 24  GLUCOSE 234*  < > 317* 107* 272* 92 97  BUN 13  < > 10 7 7  5* <5*  CREATININE 0.68  < > 0.52 0.38* 0.49 0.36* 0.32*  CALCIUM 9.3  < > 8.8* 9.0 8.6* 9.1 8.7*  MG 1.9  --   --   --   --   --   --   < > = values in this interval not displayed. Liver Function Tests:  Recent Labs Lab 05/05/15 2053 05/06/15 0437  AST 26 19  ALT 20 16  ALKPHOS 136* 95  BILITOT 1.2 0.2*  PROT 9.1* 7.1  ALBUMIN 5.3* 3.9    Recent Labs Lab 05/05/15 2053  LIPASE 17   No results for input(s): AMMONIA in the last 168 hours. CBC:  Recent Labs Lab 05/05/15 2053 05/06/15 0137  WBC 18.2* 14.2*  NEUTROABS 15.0*  --   HGB 14.8 13.2  HCT 44.5 42.1  MCV 91.2 96.1  PLT 513* 126*   Cardiac Enzymes: No results for input(s): CKTOTAL, CKMB, CKMBINDEX, TROPONINI in the last 168 hours. BNP: BNP (last 3 results) No results for input(s): BNP in the last 8760 hours.  ProBNP (last 3 results) No results for input(s): PROBNP in the last  8760  hours.  CBG:  Recent Labs Lab 05/07/15 1228 05/07/15 1619 05/07/15 2006 05/08/15 0717 05/08/15 1107  GLUCAP 122* 200* 76 76 187*       Signed:  HERNANDEZ ACOSTA,Michael Ventresca  [facility] Pager: [phone] 05/08/2015, 11:34 AM

## 2015-05-08 NOTE — Progress Notes (Signed)
Inpatient Diabetes Program Recommendations  AACE/ADA: New Consensus Statement on Inpatient Glycemic Control (2015)  Target Ranges:  Prepandial:   less than 140 mg/dL      Peak postprandial:   less than 180 mg/dL (1-2 hours)      Critically ill patients:  140 - 180 mg/dL  Results for CIRA, DEYOE (MRN [id number]) as of 05/08/2015 07:42  Ref. Range 05/07/2015 02:55 05/07/2015 07:16 05/07/2015 11:35 05/07/2015 12:28 05/07/2015 16:19 05/07/2015 20:06 05/08/2015 07:17  Glucose-Capillary Latest Ref Range: 65-99 mg/dL 71 197 (H) 57 (L) 122 (H) 200 (H) 76 76   Review of Glycemic Control  Diabetes history: DM1 Outpatient Diabetes medications: Toujeo 30 units QPM, Novolog 12 units TID with meals plus 1 unit for every 50 mg/dl above target of 150 mg/dl Current orders for Inpatient glycemic control: Lantus 25 units QHS, Novolog 6 units TID with meals for meal coverage, Novolog 0-9 units TID with meals, Novolog 0-5 units HS  Inpatient Diabetes Program Recommendations: Insulin - Basal: Please consider decreasing Lantus to 22 units QHS. Insulin - Meal Coverage: Please consider decreasing meal coverage to Novolog 4 units TID with meals.  Thanks, Barnie Alderman, RN, MSN, CDE Diabetes Coordinator Inpatient Diabetes Program [phone] (Team Pager from [place] to [place]) [phone] ([place] office) [phone] [facility] office) [phone] [facility] office)

## 2015-05-23 ENCOUNTER — Encounter: Payer: Self-pay | Admitting: "Endocrinology

## 2015-05-23 ENCOUNTER — Ambulatory Visit (INDEPENDENT_AMBULATORY_CARE_PROVIDER_SITE_OTHER): Payer: BC Managed Care – PPO | Admitting: "Endocrinology

## 2015-05-23 VITALS — BP 129/87 | HR 116 | Ht 62.0 in | Wt 109.0 lb

## 2015-05-23 DIAGNOSIS — E108 Type 1 diabetes mellitus with unspecified complications: Secondary | ICD-10-CM | POA: Diagnosis not present

## 2015-05-23 DIAGNOSIS — K8689 Other specified diseases of pancreas: Secondary | ICD-10-CM | POA: Insufficient documentation

## 2015-05-23 DIAGNOSIS — E559 Vitamin D deficiency, unspecified: Secondary | ICD-10-CM | POA: Diagnosis not present

## 2015-05-23 DIAGNOSIS — R000 Tachycardia, unspecified: Secondary | ICD-10-CM

## 2015-05-23 DIAGNOSIS — E1065 Type 1 diabetes mellitus with hyperglycemia: Secondary | ICD-10-CM | POA: Diagnosis not present

## 2015-05-23 DIAGNOSIS — IMO0002 Reserved for concepts with insufficient information to code with codable children: Secondary | ICD-10-CM | POA: Insufficient documentation

## 2015-05-23 MED ORDER — INSULIN GLARGINE 300 UNIT/ML ~~LOC~~ SOPN: 30.0000 [IU] | PEN_INJECTOR | Freq: Every day | SUBCUTANEOUS | Status: DC

## 2015-05-23 NOTE — Progress Notes (Signed)
Subjective:    Patient ID: Linda Walls, female    DOB: 09/06/1990, PCP [name], MD   Past Medical History  Diagnosis Date  . Hereditary pancreatitis   . DM type 1 (diabetes mellitus, type 1) ([place])   . Hernia   . GERD (gastroesophageal reflux disease)   . Jaundice    Past Surgical History  Procedure Laterality Date  . Hernia repair      As infant.  . Bile duct stent placement  2006 at [facility]  . Pancreatic cyst drainage  at age 50 year   Social History   Social History  . Marital Status: Single    Spouse Name: N/A  . Number of Children: N/A  . Years of Education: N/A   Social History Main Topics  . Smoking status: Never Smoker   . Smokeless tobacco: Never Used  . Alcohol Use: No  . Drug Use: No  . Sexual Activity: Yes    Birth Control/ Protection: None   Other Topics Concern  . None   Social History Narrative   Outpatient Encounter Prescriptions as of 05/23/2015  Medication Sig  . ferrous sulfate 325 (65 FE) MG tablet Take 325 mg by mouth daily with breakfast.  . GLUCAGON EMERGENCY 1 MG injection Inject 1 mg as directed once.  . hyoscyamine (LEVSIN SL) 0.125 MG SL tablet PLACE 1 TABLET UNDER THE TONGUE EVERY 4 HOURS AS NEEDED FOR PAIN. (Patient taking differently: Take 0.125 mg by mouth every 4 (four) hours as needed. PLACE 1 TABLET UNDER THE TONGUE EVERY 4 HOURS AS NEEDED FOR PAIN.)  . ibuprofen (ADVIL,MOTRIN) 200 MG tablet Take 400 mg by mouth every 6 (six) hours as needed. pain  . insulin aspart (NOVOLOG) 100 UNIT/ML injection Inject 6 Units into the skin 3 (three) times daily before meals. BE AWARE OF LOW BLOOD SUGARS AND DO NOT TAKE IF YOUR BLOOD SUGAR IS 110 OR BELOW  *Increase subcutaneous injection by 1 unit for every 50 units over 150.*  . Insulin Glargine (TOUJEO SOLOSTAR) 300 UNIT/ML SOPN Inject 30 Units into the skin daily.  . metoCLOPramide (REGLAN) 5 MG tablet Take 1 tablet (5 mg total) by mouth every 8 (eight) hours as needed for nausea.   . Multiple Vitamins-Minerals (MULTIVITAMINS THER. W/MINERALS) TABS Take 1 tablet by mouth daily. For energy  . omeprazole (PRILOSEC) 20 MG capsule Take 1 capsule (20 mg total) by mouth daily. (Patient taking differently: Take 20 mg by mouth daily as needed (Acid Reflux). )  . ondansetron (ZOFRAN-ODT) 4 MG disintegrating tablet DISSOLVE 1 TABLET BY MOUTH EVERY 8 HOURS AS NEEDED FOR NAUSEA OR VOMITING.  Marland Kitchen oxyCODONE-acetaminophen (PERCOCET) 7.5-325 MG per tablet Take one tablet up to BID prn pain (Patient taking differently: Take 1 tablet by mouth 2 (two) times daily as needed for moderate pain. Take one tablet up to BID prn pain)  . Pancrelipase, Lip-Prot-Amyl, 6000 UNITS CPEP Take 2 capsules (12,000 Units total) by mouth 3 (three) times daily before meals.  . promethazine (PHENERGAN) 25 MG suppository Place 25 mg rectally every 6 (six) hours as needed.  . promethazine (PHENERGAN) 25 MG tablet TAKE 1 TABLET BY MOUTH EVERY 6 HOURS AS NEEDED FOR NAUSEA.  . SUMAtriptan (IMITREX) 50 MG tablet TAKE 1 TABLET BY MOUTH ONCE. MAY REPEAT IN 2 HOURS IF HEADACHE PERSISTS OR RECURS.  . vitamin C (ASCORBIC ACID) 500 MG tablet Take 500 mg by mouth daily.  . [DISCONTINUED] Insulin Glargine (TOUJEO SOLOSTAR) 300 UNIT/ML SOPN Inject  25 Units into the skin daily.   No facility-administered encounter medications on file as of 05/23/2015.   ALLERGIES: Allergies  Allergen Reactions  . Shellfish Allergy Nausea And Vomiting  . Cefzil [Cefprozil] Rash  . Penicillins Rash    Has patient had a PCN reaction causing immediate rash, facial/tongue/throat swelling, SOB or lightheadedness with hypotension: NO Has patient had a PCN reaction causing severe rash involving mucus membranes or skin necrosis: NO Has patient had a PCN reaction that required hospitalization: NO Has patient had a PCN reaction occurring within the last 10 years: NO If all of the above answers are "NO", then may proceed with Cephalosporin use.   .  Sulfa Antibiotics Rash   VACCINATION STATUS: Immunization History  Administered Date(s) Administered  . DTaP 06/19/1993, 08/20/1993, 10/16/1993, 04/28/1994, 12/04/1998  . Hepatitis B 06/19/1993, 10/16/1993  . IPV 06/19/1993, 08/20/1993, 10/16/1993, 12/04/1998  . Influenza Split 05/19/2011, 05/01/2012  . Influenza,inj,Quad PF,36+ Mos 03/08/2013, 02/23/2015  . MMR 04/28/1994, 12/04/1998  . Pneumococcal Polysaccharide-23 05/19/2011, 10/16/2014  . Varicella 04/20/1995    HPI   24 yr old female with type 1 DM, from hereditary pancreatitis.  she came with yet another episode of DKA requiring hospitalization in early November triggered by abdominal pain associated with nausea vomiting 24 hours.  -After acute stabilization she was discharged on her usual basal/bolus insulin. She came with controlled glycemia for the most part. However her most recent A1c was still high at 11.2%.   She is on Toujeo 30 units and Novolog 12 units TIDAC. she also remains on Creon 6000 ( 2 caps TIDAC , 1 caps per snacks).  she has regular menstrual cycles. She has now steady weight at 109 pounds.  Review of Systems  Constitutional: Positive for fatigue. Negative for unexpected weight change.  HENT: Negative for trouble swallowing and voice change.   Eyes: Negative for visual disturbance.  Respiratory: Negative for cough, shortness of breath and wheezing.   Cardiovascular: Negative for chest pain, palpitations and leg swelling.  Gastrointestinal: Negative for nausea, vomiting and diarrhea.  Endocrine: Positive for polydipsia and polyuria. Negative for cold intolerance, heat intolerance and polyphagia.  Musculoskeletal: Negative for myalgias and arthralgias.  Skin: Negative for color change, pallor, rash and wound.  Neurological: Negative for seizures and headaches.  Psychiatric/Behavioral: Negative for suicidal ideas and confusion.    Objective:    BP 129/87 mmHg  Pulse 116  Ht _0  (1.575 m)  Wt 109  lb (49.442 kg)  BMI 19.93 kg/m2  SpO2 99%  LMP 04/02/2015 (Approximate)  Wt Readings from Last 3 Encounters:  05/23/15 109 lb (49.442 kg)  05/07/15 109 lb 2 oz (49.5 kg)  04/17/15 108 lb 3.9 oz (49.1 kg)    Physical Exam  Constitutional: She is oriented to person, place, and time. She appears well-developed.  HENT:  Head: Normocephalic and atraumatic.  Eyes: EOM are normal.  Neck: Normal range of motion. Neck supple. No tracheal deviation present. No thyromegaly present.  Cardiovascular: Normal rate and regular rhythm.   Pulmonary/Chest: Effort normal and breath sounds normal.  Abdominal: Soft. Bowel sounds are normal. There is no tenderness. There is no guarding.  Musculoskeletal: Normal range of motion. She exhibits no edema.  Neurological: She is alert and oriented to person, place, and time. She has normal reflexes. No cranial nerve deficit. Coordination normal.  Skin: Skin is warm and dry. No rash noted. No erythema. No pallor.  Psychiatric: She has a normal mood and affect. Judgment normal.  Complete Blood Count (Most recent): Lab Results  Component Value Date   WBC 14.2* 05/06/2015   HGB 13.2 05/06/2015   HCT 42.1 05/06/2015   MCV 96.1 05/06/2015   PLT 126* 05/06/2015   Chemistry (most recent): Lab Results  Component Value Date   NA 143 05/08/2015   K 3.3* 05/08/2015   CL 113* 05/08/2015   CO2 24 05/08/2015   BUN <5* 05/08/2015   CREATININE 0.32* 05/08/2015   Diabetic Labs (most recent): Lab Results  Component Value Date   HGBA1C 11.2* 05/05/2015   HGBA1C 10.8* 04/16/2015   HGBA1C 11.9* 02/22/2015    1) uncontrolled and pancreatic diabetes 2) pancreatic insufficiency 3) vitamin D deficiency   Assessment & Plan:   1. Uncontrolled type 1 diabetes mellitus with complication ([place])  She remains at a high risk for more acute and chronic complications of diabetes which include CAD, CVA, CKD, retinopathy, and neuropathy. These are all discussed in detail  with the patient.  Patient came with uncontrolled glucose profile , she was hospitalized with DKA in the interim, and  recent A1c of 11.2 %.  Glucose logs and insulin administration records pertaining to this visit,  to be scanned into patient's records.  Recent labs reviewed.   - I have re-counseled the patient on diet managemen  by adopting a carbohydrate restricted / protein rich  Diet.  - Suggestion is made for patient to avoid simple carbohydrates   from their diet including Cakes , Desserts, Ice Cream,  Soda (  diet and regular) , Sweet Tea , Candies,  Chips, Cookies, Artificial Sweeteners,   and "Sugar-free" Products .  This will help patient to have stable blood glucose profile and potentially avoid unintended  Weight gain.  - Patient is advised to stick to a routine mealtimes to eat 3 meals  a day and avoid unnecessary snacks ( to snack only to correct hypoglycemia).  - I have approached patient with the following individualized plan to manage diabetes and patient agrees.   - I will continue Toujeo 30 units qhs, and Novolog 12 units TIDAC for premeal BG readings of 90-162m/dl, plus patient specific sliding scale insulin for correction of unexpected hyperglycemia above 1584mdl, associated with strict monitoring of BG AC and HS.  she will  need coverage for Bg 70-90, with 6 units of NovoLog. I urged her to allow more carbs , specially complex carbs, to prevent planned weight loss. -Due to her tachycardia and weight loss I will obtain thyroid function test today and advised her to seek EKG test  during her next visit with Dr. LuWolfgang PhoenixHer prior AM cortisol is normal at 18. -Adjustment parameters for hypo and hyperglycemia were given in a written document to patient. -Patient is encouraged to call clinic for blood glucose levels less than 70 or above 300 mg /dl.C plus SSI.  Her diabetes is due to yet unidentified hereditary pancreatitis. She will be treated as a typical type 1 DM. -I gave  her information on [employer] contact numbers for local agent.   -Her ophthalm eval was negative, and due for another one.   -she is advised to continue creon 2 tabs per meal and 1 tab per snack.  -She will need Vitamin D 50K units weekly x 12 weeks. -She is not a candidate for metformin,SGLT2 inhibitors, and incretin therapy.  - Patient specific target  for A1c; LDL, HDL, Triglycerides, and  Waist Circumference were discussed in detail.  2) BP/HTN: Controlled.  3) vitamin D deficiency:  She is status post therapy with vitamin D 50,000 units weekly. 4) Chronic Care/Health Maintenance:  -Patient  Is encouraged to continue to follow up with Ophthalmology, Podiatrist at least yearly or according to recommendations, and advised to stay away from smoking. I have recommended yearly flu vaccine and pneumonia vaccination at least every 5 years; moderate intensity exercise for up to 150 minutes weekly; and  sleep for at least 7 hours a day.  - 25 minutes of time was spent on the care of this patient , 50% of which was applied for counseling on diabetes complications and their preventions.  - I advised patient to maintain close follow up with Dr. [name]  for primary care needs. -Due to her recurrent abdominal pain associated with nausea and vomiting triggering DKA she may need reassessment by GI services at [facility].  Patient is asked to bring meter and  blood glucose logs during their next visit.   Follow up plan: -Return in about 2 weeks (around 06/06/2015) for diabetes, labs today.  Glade Lloyd, MD Phone: [phone]  Fax: [phone]   05/23/2015, 5:55 PM

## 2015-05-24 LAB — T4, FREE: FREE T4: 1.04 ng/dL (ref 0.80–1.80)

## 2015-05-24 LAB — TSH: TSH: 0.948 u[IU]/mL (ref 0.350–4.500)

## 2015-06-04 ENCOUNTER — Other Ambulatory Visit: Payer: Self-pay | Admitting: Family Medicine

## 2015-06-06 ENCOUNTER — Ambulatory Visit (INDEPENDENT_AMBULATORY_CARE_PROVIDER_SITE_OTHER): Payer: BC Managed Care – PPO | Admitting: "Endocrinology

## 2015-06-06 ENCOUNTER — Encounter: Payer: Self-pay | Admitting: "Endocrinology

## 2015-06-06 VITALS — BP 110/71 | HR 88 | Ht 62.0 in | Wt 113.0 lb

## 2015-06-06 DIAGNOSIS — IMO0002 Reserved for concepts with insufficient information to code with codable children: Secondary | ICD-10-CM

## 2015-06-06 DIAGNOSIS — K8681 Exocrine pancreatic insufficiency: Secondary | ICD-10-CM | POA: Diagnosis not present

## 2015-06-06 DIAGNOSIS — E1069 Type 1 diabetes mellitus with other specified complication: Secondary | ICD-10-CM | POA: Diagnosis not present

## 2015-06-06 DIAGNOSIS — E1065 Type 1 diabetes mellitus with hyperglycemia: Secondary | ICD-10-CM | POA: Diagnosis not present

## 2015-06-06 DIAGNOSIS — E559 Vitamin D deficiency, unspecified: Secondary | ICD-10-CM

## 2015-06-06 DIAGNOSIS — E0865 Diabetes mellitus due to underlying condition with hyperglycemia: Secondary | ICD-10-CM | POA: Insufficient documentation

## 2015-06-06 DIAGNOSIS — K8689 Other specified diseases of pancreas: Secondary | ICD-10-CM

## 2015-06-06 MED ORDER — INSULIN ASPART 100 UNIT/ML FLEXPEN: 12.0000 [IU] | PEN_INJECTOR | Freq: Three times a day (TID) | SUBCUTANEOUS | Status: DC

## 2015-06-06 MED ORDER — GLUCOSE BLOOD VI STRP: ORAL_STRIP | Status: DC

## 2015-06-06 MED ORDER — INSULIN DEGLUDEC 100 UNIT/ML ~~LOC~~ SOPN: 30.0000 [IU] | PEN_INJECTOR | Freq: Every day | SUBCUTANEOUS | Status: DC

## 2015-06-06 NOTE — Progress Notes (Signed)
Subjective:    Patient ID: Linda Walls, female    DOB: [birth year], PCP [name], MD   Past Medical History  Diagnosis Date  . Hereditary pancreatitis   . DM type 1 (diabetes mellitus, type 1) ([place])   . Hernia   . GERD (gastroesophageal reflux disease)   . Jaundice    Past Surgical History  Procedure Laterality Date  . Hernia repair      As infant.  . Bile duct stent placement  2006 at [facility]  . Pancreatic cyst drainage  at age 54 year   Social History   Social History  . Marital Status: Single    Spouse Name: N/A  . Number of Children: N/A  . Years of Education: N/A   Social History Main Topics  . Smoking status: Never Smoker   . Smokeless tobacco: Never Used  . Alcohol Use: No  . Drug Use: No  . Sexual Activity: Yes    Birth Control/ Protection: None   Other Topics Concern  . None   Social History Narrative   Outpatient Encounter Prescriptions as of 06/06/2015  Medication Sig  . ferrous sulfate 325 (65 FE) MG tablet Take 325 mg by mouth daily with breakfast.  . GLUCAGON EMERGENCY 1 MG injection Inject 1 mg as directed once.  Marland Kitchen glucose blood (ONE TOUCH TEST STRIPS) test strip Use as instructed  . hyoscyamine (LEVSIN SL) 0.125 MG SL tablet PLACE 1 TABLET UNDER THE TONGUE EVERY 4 HOURS AS NEEDED FOR PAIN. (Patient taking differently: Take 0.125 mg by mouth every 4 (four) hours as needed. PLACE 1 TABLET UNDER THE TONGUE EVERY 4 HOURS AS NEEDED FOR PAIN.)  . ibuprofen (ADVIL,MOTRIN) 200 MG tablet Take 400 mg by mouth every 6 (six) hours as needed. pain  . insulin aspart (NOVOLOG) 100 UNIT/ML FlexPen Inject 12-18 Units into the skin 3 (three) times daily with meals.  . Insulin Degludec (TRESIBA FLEXTOUCH) 100 UNIT/ML SOPN Inject 30 Units into the skin at bedtime.  . metoCLOPramide (REGLAN) 5 MG tablet Take 1 tablet (5 mg total) by mouth every 8 (eight) hours as needed for nausea.  . Multiple Vitamins-Minerals (MULTIVITAMINS THER. W/MINERALS) TABS Take 1  tablet by mouth daily. For energy  . omeprazole (PRILOSEC) 20 MG capsule Take 1 capsule (20 mg total) by mouth daily. (Patient taking differently: Take 20 mg by mouth daily as needed (Acid Reflux). )  . ondansetron (ZOFRAN-ODT) 4 MG disintegrating tablet DISSOLVE 1 TABLET BY MOUTH EVERY 8 HOURS AS NEEDED FOR NAUSEA OR VOMITING.  Marland Kitchen oxyCODONE-acetaminophen (PERCOCET) 7.5-325 MG per tablet Take one tablet up to BID prn pain (Patient taking differently: Take 1 tablet by mouth 2 (two) times daily as needed for moderate pain. Take one tablet up to BID prn pain)  . Pancrelipase, Lip-Prot-Amyl, 6000 UNITS CPEP Take 2 capsules (12,000 Units total) by mouth 3 (three) times daily before meals.  . promethazine (PHENERGAN) 25 MG suppository Place 25 mg rectally every 6 (six) hours as needed.  . SUMAtriptan (IMITREX) 50 MG tablet TAKE 1 TABLET BY MOUTH ONCE. MAY REPEAT IN 2 HOURS IF HEADACHE PERSISTS OR RECURS.  . vitamin C (ASCORBIC ACID) 500 MG tablet Take 500 mg by mouth daily.  . [DISCONTINUED] insulin aspart (NOVOLOG) 100 UNIT/ML injection Inject 6 Units into the skin 3 (three) times daily before meals. BE AWARE OF LOW BLOOD SUGARS AND DO NOT TAKE IF YOUR BLOOD SUGAR IS 110 OR BELOW  *Increase subcutaneous injection by 1 unit for  every 50 units over 150.*  . [DISCONTINUED] Insulin Glargine (TOUJEO SOLOSTAR) 300 UNIT/ML SOPN Inject 30 Units into the skin daily.   No facility-administered encounter medications on file as of 06/06/2015.   ALLERGIES: Allergies  Allergen Reactions  . Shellfish Allergy Nausea And Vomiting  . Cefzil [Cefprozil] Rash  . Penicillins Rash    Has patient had a PCN reaction causing immediate rash, facial/tongue/throat swelling, SOB or lightheadedness with hypotension: NO Has patient had a PCN reaction causing severe rash involving mucus membranes or skin necrosis: NO Has patient had a PCN reaction that required hospitalization: NO Has patient had a PCN reaction occurring within the  last 10 years: NO If all of the above answers are "NO", then may proceed with Cephalosporin use.   . Sulfa Antibiotics Rash   VACCINATION STATUS: Immunization History  Administered Date(s) Administered  . DTaP 06/19/1993, 08/20/1993, 10/16/1993, 04/28/1994, 12/04/1998  . Hepatitis B 06/19/1993, 10/16/1993  . IPV 06/19/1993, 08/20/1993, 10/16/1993, 12/04/1998  . Influenza Split 05/19/2011, 05/01/2012  . Influenza,inj,Quad PF,36+ Mos 03/08/2013, 02/23/2015  . MMR 04/28/1994, 12/04/1998  . Pneumococcal Polysaccharide-23 05/19/2011, 10/16/2014  . Varicella 04/20/1995    HPI   24 yr old female with type 1 DM, from hereditary pancreatitis.  She came with slightly better however still fluctuating blood glucose profile. She has been consistent in using her insulin, did not bring her meter to review. -No interval hospitalizations. - However her most recent A1c was still high at 11.2%.   She is on Toujeo 30 units and Novolog 12 units TIDAC. she also remains on Creon 6000 ( 2 caps TIDAC , 1 caps per snacks).  she has regular menstrual cycles. She has gained 4 pounds since last visit, a good development for her.  -She is up-to-date on ophthalmology and dental care. 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  Review of Systems  Constitutional: Positive for fatigue. Negative for unexpected weight change.  HENT: Negative for trouble swallowing and voice change.   Eyes: Negative for visual disturbance.  Respiratory: Negative for cough, shortness of breath and wheezing.   Cardiovascular: Negative for chest pain, palpitations and leg swelling.  Gastrointestinal: Negative for nausea, vomiting and diarrhea.  Endocrine: Positive for polydipsia and polyuria. Negative for cold intolerance, heat intolerance and polyphagia.  Musculoskeletal: Negative for myalgias and arthralgias.   Skin: Negative for color change, pallor, rash and wound.  Neurological: Negative for seizures and headaches.  Psychiatric/Behavioral: Negative for suicidal ideas and confusion.    Objective:    BP 110/71 mmHg  Pulse 88  Ht '5\' 2"'  (1.575 m)  Wt 113 lb (51.256 kg)  BMI 20.66 kg/m2  SpO2 95%  Wt Readings from Last 3 Encounters:  06/06/15 113 lb (51.256 kg)  05/23/15 109 lb (49.442 kg)  05/07/15 109 lb 2 oz (49.5 kg)    Physical Exam  Constitutional: She is oriented to person, place, and time. She appears well-developed.  HENT:  Head: Normocephalic and atraumatic.  Eyes: EOM are normal.  Neck: Normal range of motion. Neck supple. No tracheal deviation present. No thyromegaly present.  Cardiovascular: Normal rate and regular rhythm.   Pulmonary/Chest: Effort normal and breath sounds normal.  Abdominal: Soft. Bowel sounds are normal. There is no tenderness. There is no guarding.  Musculoskeletal: Normal range of motion. She exhibits no edema.  Neurological: She is alert and oriented to person, place, and time. She has normal reflexes.  No cranial nerve deficit. Coordination normal.  Skin: Skin is warm and dry. No rash noted. No erythema. No pallor.  Psychiatric: She has a normal mood and affect. Judgment normal.    Complete Blood Count (Most recent): Lab Results  Component Value Date   WBC 14.2* 05/06/2015   HGB 13.2 05/06/2015   HCT 42.1 05/06/2015   MCV 96.1 05/06/2015   PLT 126* 05/06/2015   Chemistry (most recent): Lab Results  Component Value Date   NA 143 05/08/2015   K 3.3* 05/08/2015   CL 113* 05/08/2015   CO2 24 05/08/2015   BUN <5* 05/08/2015   CREATININE 0.32* 05/08/2015   Diabetic Labs (most recent): Lab Results  Component Value Date   HGBA1C 11.2* 05/05/2015   HGBA1C 10.8* 04/16/2015   HGBA1C 11.9* 02/22/2015    1) uncontrolled and pancreatic diabetes 2) pancreatic insufficiency 3) vitamin D deficiency   Assessment & Plan:   1. Uncontrolled  type 1 diabetes mellitus with complication ([place])  She came with better but still significantly fluctuating glycemic profile despite consistent use of basal/bolus insulin. -She remains at a high risk for more acute and chronic complications of diabetes which include CAD, CVA, CKD, retinopathy, and neuropathy. These are all discussed in detail with the patient. -Her  recent A1c of 11.2 %.  Glucose logs and insulin administration records pertaining to this visit,  to be scanned into patient's records.  Recent labs reviewed.  - Suggestion is made for patient to avoid simple carbohydrates   from their diet including Cakes , Desserts, Ice Cream,  Soda (  diet and regular) , Sweet Tea , Candies,  Chips, Cookies, Artificial Sweeteners,   and "Sugar-free" Products .  This will help patient to have stable blood glucose profile and potentially avoid unintended  Weight gain.  - Patient is advised to stick to a routine mealtimes to eat 3 meals  a day and avoid unnecessary snacks ( to snack only to correct hypoglycemia).  - I have approached patient with the following individualized plan to manage diabetes and patient agrees.   - I will continue Toujeo 30 units qhs, and Novolog 12 units TIDAC for premeal BG readings of 90-130m/dl, plus patient specific sliding scale insulin for correction of unexpected hyperglycemia above 1535mdl, associated with strict monitoring of BG AC and HS.  She will  need coverage for Bg 70-90, with 6 units of NovoLog.  I urged her to allow more  carbs , specially complex carbs, to prevent un-planned weight loss.  -Recent thyroid function test where normal , her prior AM cortisol is normal at 18. -Adjustment parameters for hypo and hyperglycemia were given in a written document to patient. -Patient is encouraged to call clinic for blood glucose levels less than 70 or above 300 mg /dl.C plus SSI.  Her diabetes is due to yet unidentified hereditary pancreatitis. She will be treated as  a typical type 1 DM. -I gave her information on [facility] contact numbers for local agent. -She is not a candidate for metformin,SGLT2 inhibitors, and incretin therapy. - Patient specific target  for A1c; LDL, HDL, Triglycerides, and  Waist Circumference were discussed in detail. -Her ophthalm eval was negative, and due for another one.  2) Pancreatic Insufficiency: -she is advised to continue creon 2 tabs per meal and 1 tab per snack.    3) vitamin D deficiency: She is status post therapy with vitamin D 50,000 units weekly. She will need repeat treatment with Vitamin D 50K units weekly  x 12 weeks. 4) Chronic Care/Health Maintenance:  -Patient  Is encouraged to continue to follow up with Ophthalmology, Podiatrist at least yearly or according to recommendations, and advised to stay away from smoking. I have recommended yearly flu vaccine and pneumonia vaccination at least every 5 years; moderate intensity exercise for up to 150 minutes weekly; and  sleep for at least 7 hours a day.  - 25 minutes of time was spent on the care of this patient , 50% of which was applied for counseling on diabetes complications and their preventions.  - I advised patient to maintain close follow up with Dr. [name]  for primary care needs. -Due to her recurrent abdominal pain associated with nausea and vomiting triggering DKA she will need reassessment by GI services at [facility].  Patient is asked to bring meter and  blood glucose logs during their next visit.   Follow up plan: -Return in about 3 months (around 09/04/2015) for diabetes, pancreatic insufficiency, vit D deficiency, follow up with pre-visit labs, meter, and logs.  Glade Lloyd, MD Phone: [phone]  Fax: [phone]   06/06/2015, 10:15 AM

## 2015-06-15 ENCOUNTER — Other Ambulatory Visit: Payer: Self-pay | Admitting: Family Medicine

## 2015-06-22 ENCOUNTER — Encounter: Payer: Self-pay | Admitting: Family Medicine

## 2015-06-22 ENCOUNTER — Ambulatory Visit (INDEPENDENT_AMBULATORY_CARE_PROVIDER_SITE_OTHER): Payer: BC Managed Care – PPO | Admitting: Family Medicine

## 2015-06-22 VITALS — BP 118/80 | Ht 62.0 in | Wt 120.0 lb

## 2015-06-22 DIAGNOSIS — K219 Gastro-esophageal reflux disease without esophagitis: Secondary | ICD-10-CM | POA: Diagnosis not present

## 2015-06-22 DIAGNOSIS — K859 Acute pancreatitis without necrosis or infection, unspecified: Secondary | ICD-10-CM

## 2015-06-22 DIAGNOSIS — E109 Type 1 diabetes mellitus without complications: Secondary | ICD-10-CM | POA: Diagnosis not present

## 2015-06-22 DIAGNOSIS — R1013 Epigastric pain: Secondary | ICD-10-CM | POA: Diagnosis not present

## 2015-06-22 MED ORDER — PROMETHAZINE HCL 25 MG RE SUPP: 25.0000 mg | Freq: Four times a day (QID) | RECTAL | Status: DC | PRN

## 2015-06-22 MED ORDER — OXYCODONE-ACETAMINOPHEN 7.5-325 MG PO TABS: ORAL_TABLET | ORAL | Status: DC

## 2015-06-22 NOTE — Progress Notes (Signed)
   Subjective:    Patient ID: Linda Walls, female    DOB: 27-Nov-1990, 24 y.o.   MRN: [id number]  HPIrequesting referral to gi at [place]. Dr. Aviva Signs. Long-standing history of chronic pancreatitis. Overdue for follow-up with specialist.  Hospital records reviewed. Unfortunately readmitted with ketoacidosis along with abdominal pain. Somewhat difficult to tease out the exact cause of DKA. Now on continuous monitor with insulin administration under guidance of Dr. Dorris Fetch area   Having abd pain and nausea. Ongoing problem. Taking percocet. Probably averages 1 pain pill every other day. Chronic abdominal pain associated with pancreatitis. Wants to discuss taking percocet because it has acetaminophen in it. Pt states she checks blood sugar and the acetaminophen can give false highs.  Needs refill on reglan. Was given Reglan in the hospital and it helped nausea considerably.  Day numb ref [phone]   Review of Systems No headache no chest pain mild epigastric pain slight nausea today no fever no chills    Objective:   Physical Exam  Alert vitals stable. HEENT normal. Lungs clear. Heart rare rhythm abdomen mild epigastric tenderness no CVA tenderness ankles without edema  Hospital records reviewed at length and presence of patient      Assessment & Plan:  Impression 1 chronic pancreatitis substantial with need for reassessment by patient's specialist discussed #2 type 1 diabetes related to #1. Followed by specialist. Gen. questions answered. #3 multiple recent admissions a hospital discussed #4 Reglan requests would prefer not to maintain due to potential for neurological per minute side effects discussed at length plan 25 minutes spent most in discussion. We'll press on with referral. Will maintain Percocet for now for chronic pain. Prescriptions written. Diet exercise discussed [name]

## 2015-06-27 ENCOUNTER — Encounter: Payer: Self-pay | Admitting: Family Medicine

## 2015-06-27 ENCOUNTER — Other Ambulatory Visit: Payer: Self-pay | Admitting: Family Medicine

## 2015-07-06 ENCOUNTER — Other Ambulatory Visit: Payer: Self-pay | Admitting: Family Medicine

## 2015-07-18 ENCOUNTER — Other Ambulatory Visit: Payer: Self-pay | Admitting: Family Medicine

## 2015-07-31 ENCOUNTER — Ambulatory Visit: Payer: BC Managed Care – PPO | Admitting: Family Medicine

## 2015-08-01 ENCOUNTER — Telehealth: Payer: Self-pay | Admitting: Family Medicine

## 2015-08-01 NOTE — Telephone Encounter (Signed)
Mom dropped off a copy of the procedure that was done at [facility]. Message in basket.

## 2015-08-02 ENCOUNTER — Other Ambulatory Visit: Payer: Self-pay | Admitting: Family Medicine

## 2015-08-06 ENCOUNTER — Telehealth: Payer: Self-pay

## 2015-08-06 NOTE — Telephone Encounter (Signed)
Pts insurance will not cover Toujeo. Which insulin would you like to replace this with?

## 2015-08-07 ENCOUNTER — Other Ambulatory Visit: Payer: Self-pay | Admitting: "Endocrinology

## 2015-08-07 NOTE — Telephone Encounter (Signed)
We will try [place] , she needs to stop by pickup a coupon.

## 2015-08-13 ENCOUNTER — Other Ambulatory Visit: Payer: Self-pay | Admitting: Family Medicine

## 2015-08-13 ENCOUNTER — Encounter: Payer: Self-pay | Admitting: Family Medicine

## 2015-08-20 ENCOUNTER — Other Ambulatory Visit: Payer: Self-pay | Admitting: Family Medicine

## 2015-08-28 ENCOUNTER — Other Ambulatory Visit: Payer: Self-pay | Admitting: "Endocrinology

## 2015-08-28 LAB — HEMOGLOBIN A1C
HEMOGLOBIN A1C: 9.2 % — AB (ref <5.7)
MEAN PLASMA GLUCOSE: 217 mg/dL — AB (ref <117)

## 2015-08-28 LAB — BASIC METABOLIC PANEL
BUN: 9 mg/dL (ref 7–25)
CO2: 23 mmol/L (ref 20–31)
CREATININE: 0.48 mg/dL — AB (ref 0.50–1.10)
Calcium: 9.4 mg/dL (ref 8.6–10.2)
Chloride: 104 mmol/L (ref 98–110)
GLUCOSE: 176 mg/dL — AB (ref 65–99)
Potassium: 4.5 mmol/L (ref 3.5–5.3)
Sodium: 138 mmol/L (ref 135–146)

## 2015-09-03 ENCOUNTER — Other Ambulatory Visit: Payer: Self-pay | Admitting: Family Medicine

## 2015-09-06 ENCOUNTER — Encounter: Payer: Self-pay | Admitting: "Endocrinology

## 2015-09-06 ENCOUNTER — Ambulatory Visit (INDEPENDENT_AMBULATORY_CARE_PROVIDER_SITE_OTHER): Payer: BC Managed Care – PPO | Admitting: "Endocrinology

## 2015-09-06 VITALS — BP 115/79 | HR 107 | Ht 62.0 in | Wt 128.0 lb

## 2015-09-06 DIAGNOSIS — E559 Vitamin D deficiency, unspecified: Secondary | ICD-10-CM

## 2015-09-06 DIAGNOSIS — E0865 Diabetes mellitus due to underlying condition with hyperglycemia: Secondary | ICD-10-CM

## 2015-09-06 DIAGNOSIS — K8689 Other specified diseases of pancreas: Secondary | ICD-10-CM

## 2015-09-06 DIAGNOSIS — IMO0002 Reserved for concepts with insufficient information to code with codable children: Secondary | ICD-10-CM

## 2015-09-06 DIAGNOSIS — K8681 Exocrine pancreatic insufficiency: Secondary | ICD-10-CM

## 2015-09-06 MED ORDER — INSULIN ASPART 100 UNIT/ML FLEXPEN: 12.0000 [IU] | PEN_INJECTOR | Freq: Three times a day (TID) | SUBCUTANEOUS | Status: DC

## 2015-09-06 NOTE — Progress Notes (Signed)
Subjective:    Patient ID: Linda Walls, female    DOB: 01-11-1991, PCP [name], MD   Past Medical History  Diagnosis Date  . Hereditary pancreatitis   . DM type 1 (diabetes mellitus, type 1) ([place])   . Hernia   . GERD (gastroesophageal reflux disease)   . Jaundice    Past Surgical History  Procedure Laterality Date  . Hernia repair      As infant.  . Bile duct stent placement  2006 at [facility]  . Pancreatic cyst drainage  at age 55 year   Social History   Social History  . Marital Status: Single    Spouse Name: N/A  . Number of Children: N/A  . Years of Education: N/A   Social History Main Topics  . Smoking status: Never Smoker   . Smokeless tobacco: Never Used  . Alcohol Use: No  . Drug Use: No  . Sexual Activity: Yes    Birth Control/ Protection: None   Other Topics Concern  . None   Social History Narrative   Outpatient Encounter Prescriptions as of 09/06/2015  Medication Sig  . lipase/protease/amylase (CREON) 12000 units CPEP capsule Take 24,000 Units by mouth 3 (three) times daily with meals.  . ferrous sulfate 325 (65 FE) MG tablet Take 325 mg by mouth daily with breakfast.  . GLUCAGON EMERGENCY 1 MG injection Inject 1 mg as directed once.  Marland Kitchen glucose blood (ONE TOUCH TEST STRIPS) test strip Use as instructed  . hyoscyamine (LEVSIN SL) 0.125 MG SL tablet PLACE 1 TABLET UNDER THE TONGUE EVERY 4 HOURS AS NEEDED FOR PAIN. (Patient taking differently: Take 0.125 mg by mouth every 4 (four) hours as needed. PLACE 1 TABLET UNDER THE TONGUE EVERY 4 HOURS AS NEEDED FOR PAIN.)  . ibuprofen (ADVIL,MOTRIN) 200 MG tablet Take 400 mg by mouth every 6 (six) hours as needed. pain  . insulin aspart (NOVOLOG) 100 UNIT/ML FlexPen Inject 12-18 Units into the skin 3 (three) times daily with meals.  . Insulin Degludec (TRESIBA FLEXTOUCH) 100 UNIT/ML SOPN Inject 30 Units into the skin at bedtime.  . Multiple Vitamins-Minerals (MULTIVITAMINS THER. W/MINERALS) TABS Take 1  tablet by mouth daily. For energy  . omeprazole (PRILOSEC) 20 MG capsule Take 1 capsule (20 mg total) by mouth daily. (Patient taking differently: Take 20 mg by mouth daily as needed (Acid Reflux). )  . ondansetron (ZOFRAN-ODT) 4 MG disintegrating tablet DISSOLVE 1 TABLET BY MOUTH EVERY 8 HOURS AS NEEDED FOR NAUSEA OR VOMITING.  Marland Kitchen oxyCODONE-acetaminophen (PERCOCET) 7.5-325 MG tablet Take one tablet up to BID prn pain  . Pancrelipase, Lip-Prot-Amyl, 6000 UNITS CPEP Take 2 capsules (12,000 Units total) by mouth 3 (three) times daily before meals.  . promethazine (PHENERGAN) 25 MG tablet TAKE 1 TABLET BY MOUTH EVERY 6 HOURS AS NEEDED FOR NAUSEA.  . SUMAtriptan (IMITREX) 50 MG tablet TAKE 1 TABLET BY MOUTH ONCE. MAY REPEAT IN 2 HOURS IF HEADACHE PERSISTS OR RECURS.  . vitamin C (ASCORBIC ACID) 500 MG tablet Take 500 mg by mouth daily.  . [DISCONTINUED] insulin aspart (NOVOLOG) 100 UNIT/ML FlexPen Inject 12-18 Units into the skin 3 (three) times daily with meals.  . [DISCONTINUED] promethazine (PHENERGAN) 25 MG suppository Place 25 mg rectally every 6 (six) hours as needed.  . [DISCONTINUED] promethazine (PHENERGAN) 25 MG suppository Place 1 suppository (25 mg total) rectally every 6 (six) hours as needed for nausea.  . [DISCONTINUED] promethazine (PHENERGAN) 25 MG suppository Place 1 suppository (25 mg  total) rectally every 6 (six) hours as needed for nausea.   No facility-administered encounter medications on file as of 09/06/2015.   ALLERGIES: Allergies  Allergen Reactions  . Shellfish Allergy Nausea And Vomiting  . Cefzil [Cefprozil] Rash  . Penicillins Rash    Has patient had a PCN reaction causing immediate rash, facial/tongue/throat swelling, SOB or lightheadedness with hypotension: NO Has patient had a PCN reaction causing severe rash involving mucus membranes or skin necrosis: NO Has patient had a PCN reaction that required hospitalization: NO Has patient had a PCN reaction occurring within  the last 10 years: NO If all of the above answers are "NO", then may proceed with Cephalosporin use.   . Sulfa Antibiotics Rash   VACCINATION STATUS: Immunization History  Administered Date(s) Administered  . DTaP 06/19/1993, 08/20/1993, 10/16/1993, 04/28/1994, 12/04/1998  . Hepatitis B 06/19/1993, 10/16/1993  . IPV 06/19/1993, 08/20/1993, 10/16/1993, 12/04/1998  . Influenza Split 05/19/2011, 05/01/2012  . Influenza,inj,Quad PF,36+ Mos 03/08/2013, 02/23/2015  . MMR 04/28/1994, 12/04/1998  . Pneumococcal Polysaccharide-23 05/19/2011, 10/16/2014  . 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  She came with Significantly better blood glucose profile. . -No interval hospitalizations. - Her A1c has improved to 9.2% from  11.2%.  -She is now using DEXCOM continues glucose monitoring device which helped her avoid going to extremes.  She is on Toujeo 30 units and Novolog 12 units TIDAC. she also remains on Creon 24000 ( 1 caps TIDAC , 1 caps per snacks).  she has regular menstrual cycles. She has gained 15 pounds since last visit, a good development for her, now weighs 128 pounds. He feels much better and stronger. -She is up-to-date on ophthalmology and dental care. -She is status post [facility].  -Review of Systems  Constitutional: Positive for fatigue. Negative for unexpected weight change.  HENT: Negative for trouble swallowing and voice change.   Eyes: Negative for visual disturbance.  Respiratory: Negative for cough, shortness of breath and wheezing.   Cardiovascular: Negative for chest pain, palpitations and leg swelling.  Gastrointestinal: Negative for nausea, vomiting and diarrhea.  Endocrine: Positive for polydipsia and polyuria. Negative for cold intolerance, heat intolerance and polyphagia.  Musculoskeletal: Negative for myalgias and arthralgias.  Skin: Negative for color change, pallor,  rash and wound.  Neurological: Negative for seizures and headaches.  Psychiatric/Behavioral: Negative for suicidal ideas and confusion.    Objective:    BP 115/79 mmHg  Pulse 107  Ht '5\' 2"'  (1.575 m)  Wt 128 lb (58.06 kg)  BMI 23.41 kg/m2  SpO2 99%  Wt Readings from Last 3 Encounters:  09/06/15 128 lb (58.06 kg)  06/22/15 120 lb (54.432 kg)  06/06/15 113 lb (51.256 kg)    Physical Exam  Constitutional: She is oriented to person, place, and time. She appears well-developed.  HENT:  Head: Normocephalic and atraumatic.  Eyes: EOM are normal.  Neck: Normal range of motion. Neck supple. No tracheal deviation present. No thyromegaly present.  Cardiovascular: Normal rate and regular rhythm.   Pulmonary/Chest: Effort normal and breath sounds normal.  Abdominal: Soft. Bowel sounds are normal. There is no tenderness. There is no guarding.  Musculoskeletal: Normal range of motion. She exhibits no edema.  Neurological: She is alert and oriented to person, place, and time. She has normal reflexes. No cranial nerve deficit. Coordination normal.  Skin: Skin is warm and dry. No rash noted. No erythema. No  pallor.  Psychiatric: She has a normal mood and affect. Judgment normal.    Complete Blood Count (Most recent): Lab Results  Component Value Date   WBC 14.2* 05/06/2015   HGB 13.2 05/06/2015   HCT 42.1 05/06/2015   MCV 96.1 05/06/2015   PLT 126* 05/06/2015   Chemistry (most recent): Lab Results  Component Value Date   NA 138 08/28/2015   K 4.5 08/28/2015   CL 104 08/28/2015   CO2 23 08/28/2015   BUN 9 08/28/2015   CREATININE 0.48* 08/28/2015   Diabetic Labs (most recent): Lab Results  Component Value Date   HGBA1C 9.2* 08/28/2015   HGBA1C 11.2* 05/05/2015   HGBA1C 10.8* 04/16/2015    1) uncontrolled pancreatic diabetes 2) pancreatic insufficiency 3) vitamin D deficiency   Assessment & Plan:   1. Uncontrolled type 1 diabetes mellitus with complication ([place])  She  came with better glycemic profile despite consistent use of basal/bolus insulin, utilizing continuous glucose monitoring with DEXCOM.  -She remains at a high risk for more acute and chronic complications of diabetes which include CAD, CVA, CKD, retinopathy, and neuropathy. These are all discussed in detail with the patient. -Her  recent A1c is better at 9.2% from  11.2 %.  Glucose logs and insulin administration records pertaining to this visit,  to be scanned into patient's records.  Recent labs reviewed.  - Suggestion is made for patient to avoid simple carbohydrates   from their diet including Cakes , Desserts, Ice Cream,  Soda (  diet and regular) , Sweet Tea , Candies,  Chips, Cookies, Artificial Sweeteners,   and "Sugar-free" Products .  This will help patient to have stable blood glucose profile and potentially avoid unintended  Weight gain.  - Patient is advised to stick to a routine mealtimes to eat 3 meals  a day and avoid unnecessary snacks ( to snack only to correct hypoglycemia).  - I have approached patient with the following individualized plan to manage diabetes and patient agrees.   - I will continue Tresiba 30 units qhs, and increase  Novolog to 15 units TIDAC for premeal BG readings of 90-165m/dl, plus patient specific sliding scale insulin for correction of unexpected hyperglycemia above 1524mdl, associated with strict monitoring of BG AC and HS.  She will  need coverage for Bg 70-90, with 6 units of NovoLog.  I urged her to allow more  carbs , specially complex carbs, to prevent un-planned weight loss.  -Recent thyroid function test where normal , her prior AM cortisol is normal at 18. -Adjustment parameters for hypo and hyperglycemia were given in a written document to patient. -Patient is encouraged to call clinic for blood glucose levels less than 70 or above 300 mg /dl.C plus SSI.  Her diabetes is due to yet unidentified hereditary pancreatitis. She is administratively  classified has type 1 diabetes,  will be treated as a typical type 1 DM. -[facility].  -She is not a candidate for metformin,SGLT2 inhibitors, and incretin therapy. - Patient specific target  for A1c; LDL, HDL, Triglycerides, and  Waist Circumference were discussed in detail. -Her ophthalm eval was negative, and due for another one.  2) Pancreatic Insufficiency: -she is advised to continue creon 24,000 units caps per meal and 1 tab per snack.    3) vitamin D deficiency: She is status post therapy with vitamin D 50,000 units weekly.  She is a status post treatment with Vitamin D  50K units weekly x 12 weeks. 4) Chronic Care/Health Maintenance:  -Patient  Is encouraged to continue to follow up with Ophthalmology, Podiatrist at least yearly or according to recommendations, and advised to stay away from smoking. I have recommended yearly flu vaccine and pneumonia vaccination at least every 5 years; moderate intensity exercise for up to 150 minutes weekly; and  sleep for at least 7 hours a day.  - 25 minutes of time was spent on the care of this patient , 50% of which was applied for counseling on diabetes complications and their preventions.  - I advised patient to maintain close follow up with Dr. [name]  for primary care needs.   Patient is asked to bring meter and  blood glucose logs during their next visit.   Follow up plan: -Return in about 3 months (around 12/07/2015) for diabetes, follow up with pre-visit labs, meter, and logs.  Glade Lloyd, MD Phone: [phone]  Fax: [phone]   09/06/2015, 11:58 AM

## 2015-09-14 ENCOUNTER — Other Ambulatory Visit: Payer: Self-pay | Admitting: Family Medicine

## 2015-09-14 NOTE — Telephone Encounter (Signed)
Ok three ref 

## 2015-09-15 ENCOUNTER — Other Ambulatory Visit: Payer: Self-pay | Admitting: Family Medicine

## 2015-09-25 ENCOUNTER — Encounter: Payer: Self-pay | Admitting: Family Medicine

## 2015-09-25 ENCOUNTER — Ambulatory Visit (INDEPENDENT_AMBULATORY_CARE_PROVIDER_SITE_OTHER): Payer: BC Managed Care – PPO | Admitting: Family Medicine

## 2015-09-25 VITALS — BP 118/78 | Ht 62.0 in | Wt 130.1 lb

## 2015-09-25 DIAGNOSIS — K219 Gastro-esophageal reflux disease without esophagitis: Secondary | ICD-10-CM

## 2015-09-25 DIAGNOSIS — K859 Acute pancreatitis without necrosis or infection, unspecified: Secondary | ICD-10-CM

## 2015-09-25 DIAGNOSIS — G8929 Other chronic pain: Secondary | ICD-10-CM

## 2015-09-25 DIAGNOSIS — G43709 Chronic migraine without aura, not intractable, without status migrainosus: Secondary | ICD-10-CM | POA: Diagnosis not present

## 2015-09-25 DIAGNOSIS — E109 Type 1 diabetes mellitus without complications: Secondary | ICD-10-CM

## 2015-09-25 DIAGNOSIS — R1013 Epigastric pain: Secondary | ICD-10-CM

## 2015-09-25 MED ORDER — OXYCODONE-ACETAMINOPHEN 7.5-325 MG PO TABS: ORAL_TABLET | ORAL | Status: DC

## 2015-09-25 NOTE — Progress Notes (Signed)
   Subjective:    Patient ID: Linda Walls, female    DOB: 11-13-1990, 25 y.o.   MRN: [id number] Patient arrives office for numerous concerns next   Gastroesophageal Reflux This is a chronic problem. The current episode started more than 1 month ago.   compliant with the omeprazole. No obvious side effects. Definitely needs to maintain her eating habits.  Reports finally weight gain. Sugars are now under better control. No recent hospitalizations appetite improving weight gaining. Also exercising more.  Recently had endoscopic ultrasound assessment of the pancreatic duct. Reports stains are stable in that regard.  Diabetic specialist looking towards potential "bionic pancreas"  Ongoing chronic pain midabdomen. General in region of pancreas. Comes and goes. At times severe. Has used oxycodone intermittently for this with improvement in no obvious side effects   Patient states no other concerns this visit.  Review of Systems No headache no chest pain no shortness of breath no change in bowel habits no blood in stool ROS otherwise negative    Objective:   Physical Exam Alert vital stable weight is up see growth parameters HEENT normal lungs clear. Heart regular rhythm. Midepigastric tenderness extremities normal       Assessment & Plan:  Impression #1 weight loss resolved #2 chronic pancreatitis with significant pain need for meds discussed #3 reflux ongoing need for medications number for migraine headaches generally sparse under good control uses Imitrex with assistance plan all medication refilled diet exercise discussed follow-up in 6 months [name]

## 2015-10-28 ENCOUNTER — Other Ambulatory Visit: Payer: Self-pay | Admitting: Family Medicine

## 2015-11-05 ENCOUNTER — Other Ambulatory Visit: Payer: Self-pay | Admitting: Family Medicine

## 2015-11-06 ENCOUNTER — Encounter (HOSPITAL_COMMUNITY): Payer: Self-pay | Admitting: Emergency Medicine

## 2015-11-06 ENCOUNTER — Inpatient Hospital Stay (HOSPITAL_COMMUNITY)
Admission: EM | Admit: 2015-11-06 | Discharge: 2015-11-10 | DRG: 637 | Disposition: A | Discharge status: Home / Self Care | Payer: BC Managed Care – PPO | Attending: Internal Medicine | Admitting: Internal Medicine

## 2015-11-06 DIAGNOSIS — Z880 Allergy status to penicillin: Secondary | ICD-10-CM | POA: Diagnosis not present

## 2015-11-06 DIAGNOSIS — E876 Hypokalemia: Secondary | ICD-10-CM | POA: Diagnosis present

## 2015-11-06 DIAGNOSIS — Z794 Long term (current) use of insulin: Secondary | ICD-10-CM | POA: Diagnosis not present

## 2015-11-06 DIAGNOSIS — Z91013 Allergy to seafood: Secondary | ICD-10-CM | POA: Diagnosis not present

## 2015-11-06 DIAGNOSIS — Z803 Family history of malignant neoplasm of breast: Secondary | ICD-10-CM

## 2015-11-06 DIAGNOSIS — K859 Acute pancreatitis without necrosis or infection, unspecified: Secondary | ICD-10-CM | POA: Diagnosis present

## 2015-11-06 DIAGNOSIS — Z8249 Family history of ischemic heart disease and other diseases of the circulatory system: Secondary | ICD-10-CM

## 2015-11-06 DIAGNOSIS — Z882 Allergy status to sulfonamides status: Secondary | ICD-10-CM | POA: Diagnosis not present

## 2015-11-06 DIAGNOSIS — K219 Gastro-esophageal reflux disease without esophagitis: Secondary | ICD-10-CM | POA: Diagnosis present

## 2015-11-06 DIAGNOSIS — R000 Tachycardia, unspecified: Secondary | ICD-10-CM | POA: Diagnosis present

## 2015-11-06 DIAGNOSIS — E1010 Type 1 diabetes mellitus with ketoacidosis without coma: Secondary | ICD-10-CM | POA: Diagnosis not present

## 2015-11-06 DIAGNOSIS — Z820 Family history of epilepsy and other diseases of the nervous system: Secondary | ICD-10-CM | POA: Diagnosis not present

## 2015-11-06 DIAGNOSIS — E1110 Type 2 diabetes mellitus with ketoacidosis without coma: Secondary | ICD-10-CM | POA: Diagnosis present

## 2015-11-06 DIAGNOSIS — E871 Hypo-osmolality and hyponatremia: Secondary | ICD-10-CM | POA: Diagnosis not present

## 2015-11-06 HISTORY — DX: Type 2 diabetes mellitus with ketoacidosis without coma: E11.10

## 2015-11-06 LAB — URINALYSIS, ROUTINE W REFLEX MICROSCOPIC
Bilirubin Urine: NEGATIVE
Glucose, UA: 500 mg/dL — AB
Hgb urine dipstick: NEGATIVE
Ketones, ur: >80 mg/dL — AB
Leukocytes, UA: NEGATIVE
Nitrite: NEGATIVE
Protein, ur: NEGATIVE mg/dL
Specific Gravity, Urine: 1.025 (ref 1.005–1.030)
pH: 5.5 (ref 5.0–8.0)

## 2015-11-06 LAB — CBC WITH DIFFERENTIAL/PLATELET
Band Neutrophils: 0 %
Basophils Absolute: 0 10*3/uL (ref 0.0–0.1)
Basophils Relative: 0 %
Eosinophils Absolute: 0 10*3/uL (ref 0.0–0.7)
Eosinophils Relative: 0 %
HCT: 38.9 % (ref 36.0–46.0)
Hemoglobin: 12.8 g/dL (ref 12.0–15.0)
Lymphocytes Relative: 12 %
Lymphs Abs: 2 10*3/uL (ref 0.7–4.0)
MCH: 29.4 pg (ref 26.0–34.0)
MCHC: 32.9 g/dL (ref 30.0–36.0)
MCV: 89.4 fL (ref 78.0–100.0)
Monocytes Absolute: 1 10*3/uL (ref 0.1–1.0)
Monocytes Relative: 6 %
Neutro Abs: 13 10*3/uL — ABNORMAL HIGH (ref 1.7–7.7)
Neutrophils Relative %: 82 %
Platelets: 380 10*3/uL (ref 150–400)
RBC: 4.35 MIL/uL (ref 3.87–5.11)
RDW: 12.3 % (ref 11.5–15.5)
WBC: 15.8 10*3/uL — ABNORMAL HIGH (ref 4.0–10.5)

## 2015-11-06 LAB — BLOOD GAS, VENOUS
Acid-Base Excess: 17.1 mmol/L — ABNORMAL HIGH (ref 0.0–2.0)
Bicarbonate: 11.5 mEq/L — ABNORMAL LOW (ref 20.0–24.0)
Drawn by: 317771
O2 Saturation: 74.7 %
TCO2: 13.3 mmol/L (ref 0–100)
pCO2, Ven: 24.6 mmHg — ABNORMAL LOW (ref 45.0–50.0)
pH, Ven: 7.206 — ABNORMAL LOW (ref 7.250–7.300)
pO2, Ven: 47.9 mmHg — ABNORMAL HIGH (ref 31.0–45.0)

## 2015-11-06 LAB — BASIC METABOLIC PANEL
Anion gap: 22 — ABNORMAL HIGH (ref 5–15)
BUN: 16 mg/dL (ref 6–20)
CO2: 10 mmol/L — ABNORMAL LOW (ref 22–32)
Calcium: 9.5 mg/dL (ref 8.9–10.3)
Chloride: 100 mmol/L — ABNORMAL LOW (ref 101–111)
Creatinine, Ser: 0.91 mg/dL (ref 0.44–1.00)
Glucose, Bld: 427 mg/dL — ABNORMAL HIGH (ref 65–99)
Potassium: 4.7 mmol/L (ref 3.5–5.1)
Sodium: 132 mmol/L — ABNORMAL LOW (ref 135–145)

## 2015-11-06 LAB — MAGNESIUM: Magnesium: 1.8 mg/dL (ref 1.7–2.4)

## 2015-11-06 LAB — CBG MONITORING, ED
Glucose-Capillary: 444 mg/dL — ABNORMAL HIGH (ref 65–99)
Glucose-Capillary: 395 mg/dL — ABNORMAL HIGH (ref 65–99)

## 2015-11-06 LAB — PREGNANCY, URINE: Preg Test, Ur: NEGATIVE

## 2015-11-06 MED ORDER — POTASSIUM CHLORIDE CRYS ER 20 MEQ PO TBCR
20.0000 meq | EXTENDED_RELEASE_TABLET | Freq: Once | ORAL | Status: AC
  Administered 2015-11-06: 20 meq via ORAL
  Filled 2015-11-06: qty 1

## 2015-11-06 MED ORDER — MORPHINE SULFATE (PF) 4 MG/ML IV SOLN
4.0000 mg | INTRAVENOUS | Status: AC | PRN
  Administered 2015-11-06 – 2015-11-07 (×2): 4 mg via INTRAVENOUS
  Filled 2015-11-06 (×2): qty 1

## 2015-11-06 MED ORDER — ONDANSETRON HCL 4 MG/2ML IJ SOLN
4.0000 mg | Freq: Four times a day (QID) | INTRAMUSCULAR | Status: DC | PRN
  Administered 2015-11-07 – 2015-11-09 (×8): 4 mg via INTRAVENOUS
  Filled 2015-11-06 (×8): qty 2

## 2015-11-06 MED ORDER — DEXTROSE-NACL 5-0.45 % IV SOLN: INTRAVENOUS | Status: DC

## 2015-11-06 MED ORDER — SODIUM CHLORIDE 0.9 % IV BOLUS (SEPSIS)
2000.0000 mL | Freq: Once | INTRAVENOUS | Status: AC
  Administered 2015-11-06: 2000 mL via INTRAVENOUS

## 2015-11-06 MED ORDER — SODIUM CHLORIDE 0.9 % IV SOLN
INTRAVENOUS | Status: AC
  Filled 2015-11-06: qty 2.5

## 2015-11-06 MED ORDER — SODIUM CHLORIDE 0.9 % IV SOLN: INTRAVENOUS | Status: DC

## 2015-11-06 MED ORDER — ONDANSETRON HCL 4 MG/2ML IJ SOLN
4.0000 mg | Freq: Once | INTRAMUSCULAR | Status: AC
  Administered 2015-11-06: 4 mg via INTRAVENOUS
  Filled 2015-11-06: qty 2

## 2015-11-06 MED ORDER — INSULIN REGULAR HUMAN 100 UNIT/ML IJ SOLN
INTRAMUSCULAR | Status: DC
  Administered 2015-11-07: 3.2 [IU]/h via INTRAVENOUS
  Filled 2015-11-06: qty 2.5

## 2015-11-06 NOTE — H&P (Signed)
TRH H&P   Patient Demographics:    Linda Walls, is a 25 y.o. female  MRN: [id number]   DOB - 1991-04-22  Admit Date - 11/06/2015  Outpatient Primary MD for the patient is [name], MD  Referring MD/NP/PA:  Dr. Wilson Singer  Outpatient Specialists: Dr. Dorris Fetch (endocrinologist)  Patient coming from: home  Chief Complaint  Patient presents with  . Hyperglycemia      HPI:    Linda Walls  is a 25 y.o. female,Dm type 1, apparently c/o n/v, and used a ketone strip and it was positive and presented to ED for evaluation.  Pt found to have Co2 of 10 . Pt will be admitted for DKA.      Review of systems:    In addition to the HPI above, No Fever-chills, No Headache, No changes with Vision or hearing, No problems swallowing food or Liquids, No Chest pain, Cough or Shortness of Breath, ? Abdominal pain,  Bowel movements are regular, No Blood in stool or Urine, No dysuria, No new skin rashes or bruises, No new joints pains-aches,  No new weakness, tingling, numbness in any extremity, No recent weight gain or loss, No polyuria, polydypsia or polyphagia, No significant Mental Stressors.  A full 10 point Review of Systems was done, except as stated above, all other Review of Systems were negative.   With Past History of the following :    Past Medical History  Diagnosis Date  . Hereditary pancreatitis   . DM type 1 (diabetes mellitus, type 1) ([place])   . Hernia   . GERD (gastroesophageal reflux disease)   . Jaundice   . DKA (diabetic ketoacidoses) [facility])       Past Surgical History  Procedure Laterality Date  . Hernia repair      As infant.  . Bile duct stent placement  2006 at [facility]  . Pancreatic cyst drainage  at age 89 year      Social History:     Social History  Substance Use Topics  . Smoking status: Never Smoker   . Smokeless tobacco: Never Used  .  Alcohol Use: No     Lives - at home  Mobility - ambulates without difficulty     Family History :     Family History  Problem Relation Age of Onset  . Pancreatitis Father 30    hereditary  . Hypertension Father   . GER disease Father   . Breast cancer Maternal Aunt   . Congenital heart disease Maternal Grandfather   . Prostate cancer Maternal Grandfather   . Alzheimer's disease Maternal Grandfather   . Hypothyroidism Mother       Home Medications:   Prior to Admission medications   Medication Sig Start Date End Date Taking? Authorizing Provider  CREON 24000 units CPEP Take 1 capsule by mouth 3 (three) times daily with meals. 10/30/15  Yes Historical Provider, MD  Cyanocobalamin (B-12 PO) Take 1 tablet by mouth daily.   Yes Historical Provider, MD  ferrous sulfate 325 (65 FE) MG tablet Take 325 mg by mouth daily with breakfast.   Yes Historical Provider, MD  GLUCAGON EMERGENCY 1 MG injection Inject 1 mg as directed once. 03/13/15  Yes Historical Provider, MD  hyoscyamine (LEVSIN SL) 0.125 MG SL tablet PLACE 1 TABLET UNDER THE TONGUE EVERY 4 HOURS AS NEEDED FOR PAIN. 09/17/15  Yes [name], MD  ibuprofen (ADVIL,MOTRIN) 200 MG tablet Take 400 mg by mouth every 6 (six) hours as needed. Reported on 11/06/2015   Yes Historical Provider, MD  insulin aspart (NOVOLOG) 100 UNIT/ML FlexPen Inject 12-18 Units into the skin 3 (three) times daily with meals. 09/06/15  Yes [name], MD  Insulin Degludec (TRESIBA FLEXTOUCH) 100 UNIT/ML SOPN Inject 30 Units into the skin at bedtime. Patient taking differently: Inject 30 Units into the skin daily.  06/06/15  Yes [name], MD  Multiple Vitamins-Minerals (MULTIVITAMINS THER. W/MINERALS) TABS Take 1 tablet by mouth daily. For energy   Yes Historical Provider, MD  omeprazole (PRILOSEC) 20 MG capsule Take 1 capsule (20 mg total) by mouth daily. Patient taking differently: Take 20 mg by mouth daily as needed (Acid Reflux).   01/31/15  Yes [name], MD  oxyCODONE-acetaminophen (PERCOCET) 7.5-325 MG tablet Take one tablet up to BID prn pain Patient taking differently: Take 1 tablet by mouth 2 (two) times daily as needed for moderate pain or severe pain.  09/25/15  Yes [name], MD  promethazine (PHENERGAN) 25 MG tablet TAKE 1 TABLET BY MOUTH EVERY 6 HOURS AS NEEDED FOR NAUSEA. 10/29/15  Yes [name], MD  SUMAtriptan (IMITREX) 50 MG tablet TAKE 1 TABLET BY MOUTH ONCE. MAY REPEAT IN 2 HOURS IF HEADACHE PERSISTS OR RECURS. 11/06/15  Yes Nilda Simmer, NP  vitamin C (ASCORBIC ACID) 500 MG tablet Take 500 mg by mouth daily.   Yes Historical Provider, MD     Allergies:     Allergies  Allergen Reactions  . Shellfish Allergy Nausea And Vomiting  . Cefzil [Cefprozil] Rash  . Penicillins Rash    Has patient had a PCN reaction causing immediate rash, facial/tongue/throat swelling, SOB or lightheadedness with hypotension: NO Has patient had a PCN reaction causing severe rash involving mucus membranes or skin necrosis: NO Has patient had a PCN reaction that required hospitalization: NO Has patient had a PCN reaction occurring within the last 10 years: NO If all of the above answers are "NO", then may proceed with Cephalosporin use.   . Sulfa Antibiotics Rash     Physical Exam:   Vitals  Blood pressure 109/51, pulse 132, temperature 98.2 F (36.8 C), temperature source Oral, resp. rate 18, height 5\' 2"  (1.575 m), weight 56.7 kg (125 lb), last menstrual period 10/23/2015, SpO2 97 %.   1. General ying in bed in NAD,   2. Normal affect and insight, Not Suicidal or Homicidal, Awake Alert, Oriented X 3.  3. No F.N deficits, ALL C.Nerves Intact, Strength 5/5 all 4 extremities, Sensation intact all 4 extremities, Plantars down going.  4. Ears and Eyes appear Normal, Conjunctivae clear, PERRLA. Moist Oral Mucosa.  5. Supple Neck, No JVD, No cervical lymphadenopathy appriciated, No Carotid  Bruits.  6. Symmetrical Chest wall movement, Good air movement bilaterally, CTAB.  7. RRR, No Gallops, Rubs or Murmurs, No Parasternal Heave.  8. Positive Bowel Sounds, Abdomen  Soft, No tenderness, No organomegaly appriciated,No rebound -guarding or rigidity.  9.  No Cyanosis, Normal Skin Turgor, No Skin Rash or Bruise.  10. Good muscle tone,  joints appear normal , no effusions, Normal ROM.  11. No Palpable Lymph Nodes in Neck or Axillae     Data Review:    CBC  Recent Labs Lab 11/06/15 2150  WBC 15.8*  HGB 12.8  HCT 38.9  PLT 380  MCV 89.4  MCH 29.4  MCHC 32.9  RDW 12.3  LYMPHSABS 2.0  MONOABS 1.0  EOSABS 0  BASOSABS 0   ------------------------------------------------------------------------------------------------------------------  Chemistries   Recent Labs Lab 11/06/15 2150  NA 132*  K 4.7  CL 100*  CO2 10*  GLUCOSE 427*  BUN 16  CREATININE 0.91  CALCIUM 9.5  MG 1.8   ------------------------------------------------------------------------------------------------------------------ estimated creatinine clearance is 76.7 mL/min (by C-G formula based on Cr of 0.91). ------------------------------------------------------------------------------------------------------------------ No results for input(s): TSH, T4TOTAL, T3FREE, THYROIDAB in the last 72 hours.  Invalid input(s): FREET3  Coagulation profile No results for input(s): INR, PROTIME in the last 168 hours. ------------------------------------------------------------------------------------------------------------------- No results for input(s): DDIMER in the last 72 hours. -------------------------------------------------------------------------------------------------------------------  Cardiac Enzymes No results for input(s): CKMB, TROPONINI, MYOGLOBIN in the last 168 hours.  Invalid input(s):  CK ------------------------------------------------------------------------------------------------------------------ No results found for: BNP   ---------------------------------------------------------------------------------------------------------------  Urinalysis    Component Value Date/Time   COLORURINE YELLOW 11/06/2015 2220   APPEARANCEUR CLEAR 11/06/2015 2220   LABSPEC 1.025 11/06/2015 2220   PHURINE 5.5 11/06/2015 2220   GLUCOSEU 500* 11/06/2015 2220   HGBUR NEGATIVE 11/06/2015 2220   BILIRUBINUR NEGATIVE 11/06/2015 2220   KETONESUR >80* 11/06/2015 2220   PROTEINUR NEGATIVE 11/06/2015 2220   UROBILINOGEN 0.2 05/06/2015 0045   NITRITE NEGATIVE 11/06/2015 2220   LEUKOCYTESUR NEGATIVE 11/06/2015 2220    ----------------------------------------------------------------------------------------------------------------   Imaging Results:    No results found.   Assessment & Plan:    Active Problems:   Hyponatremia   Sinus tachycardia (HCC)   DKA (diabetic ketoacidoses) ([place])    1.  DKA type 1 Hydrate agressively with NS iv IV insulin.   2.  Hyponatremia, secondary to bs elevation.  Check cmp in am  3.  Leukocytosis ? stress Check cbc in am,   4. ST , cont to monitor, on tele   DVT Prophylaxis Heparin -  Lovenox - SCDs   AM Labs Ordered, also please review Full Orders  Family Communication: Admission, patients condition and plan of care including tests being ordered have been discussed with the patient  who indicate understanding and agree with the plan and Code Status.  Code Status Full Code  Likely DC to  home  Condition GUARDED   Admission status: inpatient  Time spent in minutes : 40 minutes critical care   Jani Gravel M.D on 11/06/2015 at 11:27 PM  Between 7am to 7pm - Pager - [phone]. After 7pm go to [email or web address] - password [facility]  [facility] - Office  [phone]

## 2015-11-06 NOTE — ED Provider Notes (Signed)
CSN: [id number]     Arrival date & time 11/06/15  2028 History  By signing my name below, I, Nicole Kindred, attest that this documentation has been prepared under the direction and in the presence of Virgel Manifold, MD.   Electronically Signed: Nicole Kindred, ED Scribe. 11/06/2015. 8:43 PM  Chief Complaint  Patient presents with  . Hyperglycemia    The history is provided by the patient. No language interpreter was used.   HPI Comments: Linda Walls is a 25 y.o. female with PMHx of DM type 1, hereditary pancreatitis, and jaundice who presents to the Emergency Department complaining of possible DKA, onset earlier tonight. Pt states she tested positive for ketones at home tonight. Her CBG was 447 at home tonight. She reports associated generalized body aches, fatigue, abdominal pain, nausea, vomiting, and mild shortness of breath. Pt states she has been compliant at home with her medication. No other associated symptoms noted. No other worsening or alleviating factors noted. Pt denies cough, diarrhea, or any other pertinent symptoms.   Past Medical History  Diagnosis Date  . Hereditary pancreatitis   . DM type 1 (diabetes mellitus, type 1) ([place])   . Hernia   . GERD (gastroesophageal reflux disease)   . Jaundice   . DKA (diabetic ketoacidoses) [facility])    Past Surgical History  Procedure Laterality Date  . Hernia repair      As infant.  . Bile duct stent placement  2006 at [facility]  . Pancreatic cyst drainage  at age 68 year   Family History  Problem Relation Age of Onset  . Pancreatitis Father 30    hereditary  . Hypertension Father   . GER disease Father   . Breast cancer Maternal Aunt   . Congenital heart disease Maternal Grandfather   . Prostate cancer Maternal Grandfather   . Alzheimer's disease Maternal Grandfather    Social History  Substance Use Topics  . Smoking status: Never Smoker   . Smokeless tobacco: Never Used  . Alcohol Use: No   OB History    No  data available     Review of Systems  Constitutional: Positive for fatigue.  Respiratory: Positive for shortness of breath. Negative for cough.   Gastrointestinal: Positive for nausea, vomiting and abdominal pain. Negative for diarrhea.  Musculoskeletal: Positive for myalgias.  All other systems reviewed and are negative.    Allergies  Shellfish allergy; Cefzil; Penicillins; and Sulfa antibiotics  Home Medications   Prior to Admission medications   Medication Sig Start Date End Date Taking? Authorizing Provider  ferrous sulfate 325 (65 FE) MG tablet Take 325 mg by mouth daily with breakfast.    Historical Provider, MD  GLUCAGON EMERGENCY 1 MG injection Inject 1 mg as directed once. 03/13/15   Historical Provider, MD  glucose blood (ONE TOUCH TEST STRIPS) test strip Use as instructed 06/06/15   [name], MD  hyoscyamine (LEVSIN SL) 0.125 MG SL tablet PLACE 1 TABLET UNDER THE TONGUE EVERY 4 HOURS AS NEEDED FOR PAIN. 09/17/15   [name], MD  ibuprofen (ADVIL,MOTRIN) 200 MG tablet Take 400 mg by mouth every 6 (six) hours as needed. pain    Historical Provider, MD  insulin aspart (NOVOLOG) 100 UNIT/ML FlexPen Inject 12-18 Units into the skin 3 (three) times daily with meals. 09/06/15   [name], MD  Insulin Degludec (TRESIBA FLEXTOUCH) 100 UNIT/ML SOPN Inject 30 Units into the skin at bedtime. 06/06/15   [name], MD  lipase/protease/amylase (CREON) 12000 units CPEP capsule Take 24,000 Units by mouth 3 (three) times daily with meals.    Historical Provider, MD  Multiple Vitamins-Minerals (MULTIVITAMINS THER. W/MINERALS) TABS Take 1 tablet by mouth daily. For energy    Historical Provider, MD  omeprazole (PRILOSEC) 20 MG capsule Take 1 capsule (20 mg total) by mouth daily. Patient taking differently: Take 20 mg by mouth daily as needed (Acid Reflux).  01/31/15   [name], MD  ondansetron (ZOFRAN-ODT) 4 MG disintegrating tablet DISSOLVE 1 TABLET BY  MOUTH EVERY 8 HOURS AS NEEDED FOR NAUSEA OR VOMITING. 01/31/15   [name], MD  oxyCODONE-acetaminophen [facility]) 7.5-325 MG tablet Take one tablet up to BID prn pain 09/25/15   [name], MD  oxyCODONE-acetaminophen (PERCOCET) 7.5-325 MG tablet Take one tablet up to BID prn pain 09/25/15   [name], MD  Pancrelipase, Lip-Prot-Amyl, 6000 UNITS CPEP Take 2 capsules (12,000 Units total) by mouth 3 (three) times daily before meals. 01/31/15   [name], MD  promethazine (PHENERGAN) 25 MG tablet TAKE 1 TABLET BY MOUTH EVERY 6 HOURS AS NEEDED FOR NAUSEA. 10/29/15   [name], MD  SUMAtriptan (IMITREX) 50 MG tablet TAKE 1 TABLET BY MOUTH ONCE. MAY REPEAT IN 2 HOURS IF HEADACHE PERSISTS OR RECURS. 11/06/15   Nilda Simmer, NP  vitamin C (ASCORBIC ACID) 500 MG tablet Take 500 mg by mouth daily.    Historical Provider, MD   BP 122/75 mmHg  Pulse 136  Temp(Src) 98.2 F (36.8 C) (Oral)  Resp 18  Ht 5\' 2"  (1.575 m)  Wt 125 lb (56.7 kg)  BMI 22.86 kg/m2  SpO2 100%  LMP 10/23/2015 Physical Exam  Constitutional: She is oriented to person, place, and time. She appears well-developed and well-nourished. No distress.  HENT:  Head: Normocephalic and atraumatic.  Eyes: EOM are normal.  Neck: Normal range of motion.  Cardiovascular: Regular rhythm and normal heart sounds.  Tachycardia present.   Pulmonary/Chest: Effort normal and breath sounds normal. Tachypnea noted.  Abdominal: Soft. She exhibits no distension. There is tenderness. There is no rebound and no guarding.  Mild diffuse abdominal TTP.   Musculoskeletal: Normal range of motion.  Neurological: She is alert and oriented to person, place, and time.  Skin: Skin is warm and dry.  Psychiatric: She has a normal mood and affect. Judgment normal.  Nursing note and vitals reviewed.   ED Course  Procedures (including critical care time)  CRITICAL CARE Performed by: Virgel Manifold Total critical care time: 35  minutes Critical care time was exclusive of separately billable procedures and treating other patients. Critical care was necessary to treat or prevent imminent or life-threatening deterioration. Critical care was time spent personally by me on the following activities: development of treatment plan with patient and/or surrogate as well as nursing, discussions with consultants, evaluation of patient's response to treatment, examination of patient, obtaining history from patient or surrogate, ordering and performing treatments and interventions, ordering and review of laboratory studies, ordering and review of radiographic studies, pulse oximetry and re-evaluation of patient's condition.  DIAGNOSTIC STUDIES: Oxygen Saturation is 100% on RA, normal by my interpretation.    COORDINATION OF CARE: 8:42 PM Discussed treatment plan which includes CBC with differential, BMP, urinalysis, and CBg monitoring with pt at bedside and pt agreed to plan.  Labs Review Labs Reviewed  CBC WITH DIFFERENTIAL/PLATELET - Abnormal; Notable for the following:    WBC 15.8 (*)    Neutro  Abs 13.0 (*)    All other components within normal limits  BASIC METABOLIC PANEL - Abnormal; Notable for the following:    Sodium 132 (*)    Chloride 100 (*)    CO2 10 (*)    Glucose, Bld 427 (*)    Anion gap 22.0 (*)    All other components within normal limits  URINALYSIS, ROUTINE W REFLEX MICROSCOPIC (NOT AT [facility]) - Abnormal; Notable for the following:    Glucose, UA 500 (*)    Ketones, ur >80 (*)    All other components within normal limits  BLOOD GAS, VENOUS - Abnormal; Notable for the following:    pH, Ven 7.206 (*)    pCO2, Ven 24.6 (*)    pO2, Ven 47.9 (*)    Bicarbonate 11.5 (*)    Acid-Base Excess 17.1 (*)    All other components within normal limits  BASIC METABOLIC PANEL - Abnormal; Notable for the following:    CO2 9 (*)    Glucose, Bld 365 (*)    Anion gap 21 (*)    All other components within normal limits   BASIC METABOLIC PANEL - Abnormal; Notable for the following:    CO2 10 (*)    Glucose, Bld 198 (*)    All other components within normal limits  BASIC METABOLIC PANEL - Abnormal; Notable for the following:    Sodium 134 (*)    CO2 16 (*)    Glucose, Bld 129 (*)    All other components within normal limits  BASIC METABOLIC PANEL - Abnormal; Notable for the following:    Sodium 134 (*)    CO2 11 (*)    Glucose, Bld 213 (*)    Calcium 8.6 (*)    All other components within normal limits  BASIC METABOLIC PANEL - Abnormal; Notable for the following:    Chloride 114 (*)    CO2 16 (*)    Glucose, Bld 124 (*)    BUN 5 (*)    Calcium 8.5 (*)    All other components within normal limits  GLUCOSE, CAPILLARY - Abnormal; Notable for the following:    Glucose-Capillary 300 (*)    All other components within normal limits  GLUCOSE, CAPILLARY - Abnormal; Notable for the following:    Glucose-Capillary 216 (*)    All other components within normal limits  GLUCOSE, CAPILLARY - Abnormal; Notable for the following:    Glucose-Capillary 217 (*)    All other components within normal limits  GLUCOSE, CAPILLARY - Abnormal; Notable for the following:    Glucose-Capillary 180 (*)    All other components within normal limits  GLUCOSE, CAPILLARY - Abnormal; Notable for the following:    Glucose-Capillary 190 (*)    All other components within normal limits  GLUCOSE, CAPILLARY - Abnormal; Notable for the following:    Glucose-Capillary 166 (*)    All other components within normal limits  GLUCOSE, CAPILLARY - Abnormal; Notable for the following:    Glucose-Capillary 127 (*)    All other components within normal limits  GLUCOSE, CAPILLARY - Abnormal; Notable for the following:    Glucose-Capillary 133 (*)    All other components within normal limits  GLUCOSE, CAPILLARY - Abnormal; Notable for the following:    Glucose-Capillary 147 (*)    All other components within normal limits  GLUCOSE,  CAPILLARY - Abnormal; Notable for the following:    Glucose-Capillary 167 (*)    All other components within normal limits  GLUCOSE, CAPILLARY - Abnormal; Notable for the following:    Glucose-Capillary 168 (*)    All other components within normal limits  GLUCOSE, CAPILLARY - Abnormal; Notable for the following:    Glucose-Capillary 221 (*)    All other components within normal limits  GLUCOSE, CAPILLARY - Abnormal; Notable for the following:    Glucose-Capillary 195 (*)    All other components within normal limits  GLUCOSE, CAPILLARY - Abnormal; Notable for the following:    Glucose-Capillary 182 (*)    All other components within normal limits  GLUCOSE, CAPILLARY - Abnormal; Notable for the following:    Glucose-Capillary 121 (*)    All other components within normal limits  GLUCOSE, CAPILLARY - Abnormal; Notable for the following:    Glucose-Capillary 130 (*)    All other components within normal limits  GLUCOSE, CAPILLARY - Abnormal; Notable for the following:    Glucose-Capillary 194 (*)    All other components within normal limits  GLUCOSE, CAPILLARY - Abnormal; Notable for the following:    Glucose-Capillary 223 (*)    All other components within normal limits  GLUCOSE, CAPILLARY - Abnormal; Notable for the following:    Glucose-Capillary 155 (*)    All other components within normal limits  GLUCOSE, CAPILLARY - Abnormal; Notable for the following:    Glucose-Capillary 128 (*)    All other components within normal limits  BASIC METABOLIC PANEL - Abnormal; Notable for the following:    Potassium 3.4 (*)    CO2 18 (*)    Glucose, Bld 128 (*)    BUN 4 (*)    Calcium 8.4 (*)    All other components within normal limits  GLUCOSE, CAPILLARY - Abnormal; Notable for the following:    Glucose-Capillary 105 (*)    All other components within normal limits  GLUCOSE, CAPILLARY - Abnormal; Notable for the following:    Glucose-Capillary 117 (*)    All other components within  normal limits  GLUCOSE, CAPILLARY - Abnormal; Notable for the following:    Glucose-Capillary 152 (*)    All other components within normal limits  BASIC METABOLIC PANEL - Abnormal; Notable for the following:    Potassium 3.3 (*)    Chloride 112 (*)    CO2 20 (*)    Glucose, Bld 136 (*)    BUN <5 (*)    Creatinine, Ser 0.35 (*)    Calcium 8.5 (*)    All other components within normal limits  GLUCOSE, CAPILLARY - Abnormal; Notable for the following:    Glucose-Capillary 198 (*)    All other components within normal limits  GLUCOSE, CAPILLARY - Abnormal; Notable for the following:    Glucose-Capillary 176 (*)    All other components within normal limits  GLUCOSE, CAPILLARY - Abnormal; Notable for the following:    Glucose-Capillary 143 (*)    All other components within normal limits  GLUCOSE, CAPILLARY - Abnormal; Notable for the following:    Glucose-Capillary 125 (*)    All other components within normal limits  GLUCOSE, CAPILLARY - Abnormal; Notable for the following:    Glucose-Capillary 126 (*)    All other components within normal limits  GLUCOSE, CAPILLARY - Abnormal; Notable for the following:    Glucose-Capillary 183 (*)    All other components within normal limits  GLUCOSE, CAPILLARY - Abnormal; Notable for the following:    Glucose-Capillary 180 (*)    All other components within normal limits  MAGNESIUM - Abnormal; Notable for the  following:    Magnesium 1.5 (*)    All other components within normal limits  BASIC METABOLIC PANEL - Abnormal; Notable for the following:    Glucose, Bld 269 (*)    BUN <5 (*)    Calcium 8.6 (*)    All other components within normal limits  GLUCOSE, CAPILLARY - Abnormal; Notable for the following:    Glucose-Capillary 225 (*)    All other components within normal limits  GLUCOSE, CAPILLARY - Abnormal; Notable for the following:    Glucose-Capillary 166 (*)    All other components within normal limits  GLUCOSE, CAPILLARY -  Abnormal; Notable for the following:    Glucose-Capillary 138 (*)    All other components within normal limits  GLUCOSE, CAPILLARY - Abnormal; Notable for the following:    Glucose-Capillary 110 (*)    All other components within normal limits  BASIC METABOLIC PANEL - Abnormal; Notable for the following:    Potassium 3.3 (*)    Glucose, Bld 141 (*)    BUN <5 (*)    Calcium 8.8 (*)    Anion gap 4 (*)    All other components within normal limits  GLUCOSE, CAPILLARY - Abnormal; Notable for the following:    Glucose-Capillary 383 (*)    All other components within normal limits  GLUCOSE, CAPILLARY - Abnormal; Notable for the following:    Glucose-Capillary 172 (*)    All other components within normal limits  GLUCOSE, CAPILLARY - Abnormal; Notable for the following:    Glucose-Capillary 108 (*)    All other components within normal limits  GLUCOSE, CAPILLARY - Abnormal; Notable for the following:    Glucose-Capillary 312 (*)    All other components within normal limits  GLUCOSE, CAPILLARY - Abnormal; Notable for the following:    Glucose-Capillary 143 (*)    All other components within normal limits  GLUCOSE, CAPILLARY - Abnormal; Notable for the following:    Glucose-Capillary 101 (*)    All other components within normal limits  CBG MONITORING, ED - Abnormal; Notable for the following:    Glucose-Capillary 444 (*)    All other components within normal limits  CBG MONITORING, ED - Abnormal; Notable for the following:    Glucose-Capillary 395 (*)    All other components within normal limits  CBG MONITORING, ED - Abnormal; Notable for the following:    Glucose-Capillary 376 (*)    All other components within normal limits  MRSA PCR SCREENING  MAGNESIUM  PREGNANCY, URINE  TSH  CORTISOL  ACTH STIMULATION, 3 TIME POINTS  BLOOD GAS, ARTERIAL    Imaging Review No results found. I have personally reviewed and evaluated these lab results as part of my medical  decision-making.   EKG Interpretation   Date/Time:  Tuesday Nov 06 2015 21:09:34 EDT Ventricular Rate:  123 PR Interval:  139 QRS Duration: 83 QT Interval:  297 QTC Calculation: 425 R Axis:   69 Text Interpretation:  Sinus tachycardia No significant change since last  tracing Confirmed by Maryan Rued  MD, Loree Fee ([id number]) on 11/07/2015 10:09:46  PM      MDM   Final diagnoses:  Diabetic ketoacidosis without coma associated with type 1 diabetes mellitus ([place])   24yF with DKA. No clear precipitant. She reports compliance with her medications. Afebrile. Abdominal pain, but suspect this is secondary and that inciting event. UA is still pending.   I personally preformed the services scribed in my presence. The recorded information has been reviewed is accurate.  Virgel Manifold, MD.     Virgel Manifold, MD 11/11/15 [id number]

## 2015-11-06 NOTE — ED Notes (Signed)
IV started with 24 g and fluids hung. 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 Her vessels are very tiny and IV established- unable to draw labs

## 2015-11-06 NOTE — ED Notes (Signed)
Pt states she "feels like I'm in DKA". States dizziness and N/V/. Tested positive for ketones at home.  CBG 447 at home, 444 in triage. Pt states she did not take the last insulin dose.

## 2015-11-07 ENCOUNTER — Encounter (HOSPITAL_COMMUNITY): Payer: Self-pay | Admitting: *Deleted

## 2015-11-07 LAB — GLUCOSE, CAPILLARY
GLUCOSE-CAPILLARY: 300 mg/dL — AB (ref 65–99)
GLUCOSE-CAPILLARY: 216 mg/dL — AB (ref 65–99)
GLUCOSE-CAPILLARY: 180 mg/dL — AB (ref 65–99)
GLUCOSE-CAPILLARY: 190 mg/dL — AB (ref 65–99)
GLUCOSE-CAPILLARY: 166 mg/dL — AB (ref 65–99)
GLUCOSE-CAPILLARY: 147 mg/dL — AB (ref 65–99)
GLUCOSE-CAPILLARY: 167 mg/dL — AB (ref 65–99)
GLUCOSE-CAPILLARY: 195 mg/dL — AB (ref 65–99)
GLUCOSE-CAPILLARY: 182 mg/dL — AB (ref 65–99)
GLUCOSE-CAPILLARY: 194 mg/dL — AB (ref 65–99)
Glucose-Capillary: 217 mg/dL — ABNORMAL HIGH (ref 65–99)
Glucose-Capillary: 127 mg/dL — ABNORMAL HIGH (ref 65–99)
Glucose-Capillary: 133 mg/dL — ABNORMAL HIGH (ref 65–99)
Glucose-Capillary: 168 mg/dL — ABNORMAL HIGH (ref 65–99)
Glucose-Capillary: 221 mg/dL — ABNORMAL HIGH (ref 65–99)
Glucose-Capillary: 121 mg/dL — ABNORMAL HIGH (ref 65–99)
Glucose-Capillary: 130 mg/dL — ABNORMAL HIGH (ref 65–99)
Glucose-Capillary: 223 mg/dL — ABNORMAL HIGH (ref 65–99)
Glucose-Capillary: 128 mg/dL — ABNORMAL HIGH (ref 65–99)
Glucose-Capillary: 155 mg/dL — ABNORMAL HIGH (ref 65–99)
Glucose-Capillary: 105 mg/dL — ABNORMAL HIGH (ref 65–99)
Glucose-Capillary: 117 mg/dL — ABNORMAL HIGH (ref 65–99)

## 2015-11-07 LAB — BASIC METABOLIC PANEL
Anion gap: 21 — ABNORMAL HIGH (ref 5–15)
Anion gap: 15 (ref 5–15)
Anion gap: 9 (ref 5–15)
Anion gap: 14 (ref 5–15)
Anion gap: 7 (ref 5–15)
BUN: 15 mg/dL (ref 6–20)
BUN: 12 mg/dL (ref 6–20)
BUN: 9 mg/dL (ref 6–20)
BUN: 7 mg/dL (ref 6–20)
BUN: 5 mg/dL — AB (ref 6–20)
CALCIUM: 9.1 mg/dL (ref 8.9–10.3)
CHLORIDE: 111 mmol/L (ref 101–111)
CHLORIDE: 109 mmol/L (ref 101–111)
CHLORIDE: 109 mmol/L (ref 101–111)
CHLORIDE: 114 mmol/L — AB (ref 101–111)
CO2: 9 mmol/L — AB (ref 22–32)
CO2: 10 mmol/L — ABNORMAL LOW (ref 22–32)
CO2: 16 mmol/L — ABNORMAL LOW (ref 22–32)
CO2: 11 mmol/L — AB (ref 22–32)
CO2: 16 mmol/L — AB (ref 22–32)
CREATININE: 0.76 mg/dL (ref 0.44–1.00)
CREATININE: 0.5 mg/dL (ref 0.44–1.00)
CREATININE: 0.63 mg/dL (ref 0.44–1.00)
CREATININE: 0.45 mg/dL (ref 0.44–1.00)
Calcium: 9 mg/dL (ref 8.9–10.3)
Calcium: 9.2 mg/dL (ref 8.9–10.3)
Calcium: 8.6 mg/dL — ABNORMAL LOW (ref 8.9–10.3)
Calcium: 8.5 mg/dL — ABNORMAL LOW (ref 8.9–10.3)
Chloride: 106 mmol/L (ref 101–111)
Creatinine, Ser: 0.92 mg/dL (ref 0.44–1.00)
GFR calc Af Amer: >60 mL/min (ref >60)
GFR calc Af Amer: >60 mL/min (ref >60)
GFR calc Af Amer: >60 mL/min (ref >60)
GFR calc Af Amer: >60 mL/min (ref >60)
GFR calc Af Amer: >60 mL/min (ref >60)
GFR calc non Af Amer: >60 mL/min (ref >60)
GFR calc non Af Amer: >60 mL/min (ref >60)
GFR calc non Af Amer: >60 mL/min (ref >60)
GFR calc non Af Amer: >60 mL/min (ref >60)
GLUCOSE: 365 mg/dL — AB (ref 65–99)
GLUCOSE: 198 mg/dL — AB (ref 65–99)
GLUCOSE: 129 mg/dL — AB (ref 65–99)
GLUCOSE: 213 mg/dL — AB (ref 65–99)
GLUCOSE: 124 mg/dL — AB (ref 65–99)
POTASSIUM: 4.3 mmol/L (ref 3.5–5.1)
POTASSIUM: 4.5 mmol/L (ref 3.5–5.1)
POTASSIUM: 3.8 mmol/L (ref 3.5–5.1)
Potassium: 5.1 mmol/L (ref 3.5–5.1)
Potassium: 5 mmol/L (ref 3.5–5.1)
SODIUM: 136 mmol/L (ref 135–145)
SODIUM: 134 mmol/L — AB (ref 135–145)
SODIUM: 134 mmol/L — AB (ref 135–145)
Sodium: 136 mmol/L (ref 135–145)
Sodium: 137 mmol/L (ref 135–145)

## 2015-11-07 LAB — MRSA PCR SCREENING: MRSA BY PCR: NEGATIVE

## 2015-11-07 LAB — CBG MONITORING, ED: Glucose-Capillary: 376 mg/dL — ABNORMAL HIGH (ref 65–99)

## 2015-11-07 MED ORDER — ADULT MULTIVITAMIN W/MINERALS CH
1.0000 | ORAL_TABLET | Freq: Every day | ORAL | Status: DC
  Administered 2015-11-07 – 2015-11-10 (×4): 1 via ORAL
  Filled 2015-11-07 (×4): qty 1

## 2015-11-07 MED ORDER — SODIUM CHLORIDE 0.9 % IV BOLUS (SEPSIS)
1000.0000 mL | Freq: Once | INTRAVENOUS | Status: AC
  Administered 2015-11-07: 1000 mL via INTRAVENOUS

## 2015-11-07 MED ORDER — OXYCODONE-ACETAMINOPHEN 7.5-325 MG PO TABS
1.0000 | ORAL_TABLET | Freq: Two times a day (BID) | ORAL | Status: DC | PRN
  Administered 2015-11-07 – 2015-11-10 (×4): 1 via ORAL
  Filled 2015-11-07 (×4): qty 1

## 2015-11-07 MED ORDER — PANTOPRAZOLE SODIUM 40 MG PO TBEC
40.0000 mg | DELAYED_RELEASE_TABLET | Freq: Every day | ORAL | Status: DC
  Administered 2015-11-07 – 2015-11-10 (×4): 40 mg via ORAL
  Filled 2015-11-07 (×4): qty 1

## 2015-11-07 MED ORDER — MORPHINE SULFATE (PF) 2 MG/ML IV SOLN
1.0000 mg | INTRAVENOUS | Status: DC | PRN
  Administered 2015-11-07: 1 mg via INTRAVENOUS
  Filled 2015-11-07: qty 1

## 2015-11-07 MED ORDER — PANCRELIPASE (LIP-PROT-AMYL) 12000-38000 UNITS PO CPEP
24000.0000 [IU] | ORAL_CAPSULE | Freq: Three times a day (TID) | ORAL | Status: DC
  Administered 2015-11-07 – 2015-11-10 (×9): 24000 [IU] via ORAL
  Filled 2015-11-07 (×10): qty 2

## 2015-11-07 MED ORDER — DEXTROSE-NACL 5-0.45 % IV SOLN
INTRAVENOUS | Status: DC
  Administered 2015-11-07 (×2): via INTRAVENOUS

## 2015-11-07 MED ORDER — VITAMIN B-12 100 MCG PO TABS
100.0000 ug | ORAL_TABLET | Freq: Every day | ORAL | Status: DC
  Administered 2015-11-08 – 2015-11-10 (×3): 100 ug via ORAL
  Filled 2015-11-07 (×6): qty 1

## 2015-11-07 MED ORDER — POTASSIUM CHLORIDE 10 MEQ/100ML IV SOLN
10.0000 meq | INTRAVENOUS | Status: AC
  Administered 2015-11-07 (×2): 10 meq via INTRAVENOUS
  Filled 2015-11-07 (×2): qty 100

## 2015-11-07 MED ORDER — ENOXAPARIN SODIUM 40 MG/0.4ML ~~LOC~~ SOLN
40.0000 mg | SUBCUTANEOUS | Status: DC
  Administered 2015-11-07 – 2015-11-10 (×4): 40 mg via SUBCUTANEOUS
  Filled 2015-11-07 (×4): qty 0.4

## 2015-11-07 MED ORDER — MORPHINE SULFATE (PF) 2 MG/ML IV SOLN
2.0000 mg | INTRAVENOUS | Status: DC | PRN
  Administered 2015-11-07 – 2015-11-10 (×15): 2 mg via INTRAVENOUS
  Filled 2015-11-07 (×15): qty 1

## 2015-11-07 MED ORDER — SODIUM CHLORIDE 0.9 % IV SOLN
INTRAVENOUS | Status: AC
  Administered 2015-11-07: 01:00:00 via INTRAVENOUS

## 2015-11-07 MED ORDER — CYANOCOBALAMIN 500 MCG PO TABS
ORAL_TABLET | Freq: Every day | ORAL | Status: DC
  Filled 2015-11-07: qty 2

## 2015-11-07 MED ORDER — SODIUM CHLORIDE 0.9 % IV SOLN: INTRAVENOUS | Status: DC

## 2015-11-07 MED ORDER — INSULIN REGULAR HUMAN 100 UNIT/ML IJ SOLN
INTRAMUSCULAR | Status: DC
  Filled 2015-11-07: qty 2.5

## 2015-11-07 MED ORDER — VITAMIN C 500 MG PO TABS
500.0000 mg | ORAL_TABLET | Freq: Every day | ORAL | Status: DC
  Administered 2015-11-07 – 2015-11-10 (×4): 500 mg via ORAL
  Filled 2015-11-07 (×4): qty 1

## 2015-11-07 MED ORDER — FERROUS SULFATE 325 (65 FE) MG PO TABS
325.0000 mg | ORAL_TABLET | Freq: Every day | ORAL | Status: DC
  Administered 2015-11-07 – 2015-11-10 (×4): 325 mg via ORAL
  Filled 2015-11-07 (×4): qty 1

## 2015-11-07 NOTE — Progress Notes (Signed)
[facility] PROGRESS NOTE  MARIAMA EDNIE [id number] DOB: 1990/10/23    PCP:   [name], MD   HPI: Linda Walls is an 25 y.o. 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 She was started on IV insulin and IVF.  She is feeling a little better, but still has abdominal pain.  Her nausea has improved.     Rewiew of Systems:  Constitutional: Negative for malaise, fever and chills. No significant weight loss or weight gain Eyes: Negative for eye pain, redness and discharge, diplopia, visual changes, or flashes of light. ENMT: Negative for ear pain, hoarseness, nasal congestion, sinus pressure and sore throat. No headaches; tinnitus, drooling, or problem swallowing. Cardiovascular: Negative for chest pain, palpitations, diaphoresis, dyspnea and peripheral edema. ; No orthopnea, PND Respiratory: Negative for cough, hemoptysis, wheezing and stridor. No pleuritic chestpain. Gastrointestinal: Negative for nausea, vomiting, diarrhea, constipation, melena, blood in stool, hematemesis, jaundice and rectal bleeding.    Genitourinary: Negative for frequency, dysuria, incontinence,flank pain and hematuria; Musculoskeletal: Negative for back pain and neck pain. Negative for swelling and trauma.;  Skin: . Negative for pruritus, rash, abrasions, bruising and skin lesion.; ulcerations Neuro: Negative for headache, lightheadedness and neck stiffness. Negative for weakness, altered level of consciousness , altered mental status, extremity weakness, burning feet, involuntary movement, seizure and syncope.  Psych: negative for anxiety, depression, insomnia, tearfulness, panic attacks, hallucinations, paranoia, suicidal or homicidal ideation    Past Medical History  Diagnosis Date  . Hereditary pancreatitis   . DM type 1 (diabetes mellitus, type 1) ([place])   . Hernia    . GERD (gastroesophageal reflux disease)   . Jaundice   . DKA (diabetic ketoacidoses) [facility])     Past Surgical History  Procedure Laterality Date  . Hernia repair      As infant.  . Bile duct stent placement  2006 at [facility]  . Pancreatic cyst drainage  at age 70 year    Medications:  HOME MEDS: Prior to Admission medications   Medication Sig Start Date End Date Taking? Authorizing Provider  CREON 24000 units CPEP Take 1 capsule by mouth 3 (three) times daily with meals. 10/30/15  Yes Historical Provider, MD  Cyanocobalamin (B-12 PO) Take 1 tablet by mouth daily.   Yes Historical Provider, MD  ferrous sulfate 325 (65 FE) MG tablet Take 325 mg by mouth daily with breakfast.   Yes Historical Provider, MD  GLUCAGON EMERGENCY 1 MG injection Inject 1 mg as directed once. 03/13/15  Yes Historical Provider, MD  hyoscyamine (LEVSIN SL) 0.125 MG SL tablet PLACE 1 TABLET UNDER THE TONGUE EVERY 4 HOURS AS NEEDED FOR PAIN. 09/17/15  Yes [name], MD  ibuprofen (ADVIL,MOTRIN) 200 MG tablet Take 400 mg by mouth every 6 (six) hours as needed. Reported on 11/06/2015   Yes Historical Provider, MD  insulin aspart (NOVOLOG) 100 UNIT/ML FlexPen Inject 12-18 Units into the skin 3 (three) times daily with meals. 09/06/15  Yes [name], MD  Insulin Degludec (TRESIBA FLEXTOUCH) 100 UNIT/ML SOPN Inject 30 Units into the skin at bedtime. Patient taking differently: Inject 30 Units into the skin daily.  06/06/15  Yes [name], MD  Multiple Vitamins-Minerals (MULTIVITAMINS THER. W/MINERALS) TABS Take 1 tablet by mouth daily. For energy   Yes Historical Provider, MD  omeprazole (PRILOSEC) 20 MG capsule Take 1  capsule (20 mg total) by mouth daily. Patient taking differently: Take 20 mg by mouth daily as needed (Acid Reflux).  01/31/15  Yes [name], MD  oxyCODONE-acetaminophen (PERCOCET) 7.5-325 MG tablet Take one tablet up to BID prn pain Patient taking differently: Take 1 tablet by  mouth 2 (two) times daily as needed for moderate pain or severe pain.  09/25/15  Yes [name], MD  promethazine (PHENERGAN) 25 MG tablet TAKE 1 TABLET BY MOUTH EVERY 6 HOURS AS NEEDED FOR NAUSEA. 10/29/15  Yes [name], MD  SUMAtriptan (IMITREX) 50 MG tablet TAKE 1 TABLET BY MOUTH ONCE. MAY REPEAT IN 2 HOURS IF HEADACHE PERSISTS OR RECURS. 11/06/15  Yes Nilda Simmer, NP  vitamin C (ASCORBIC ACID) 500 MG tablet Take 500 mg by mouth daily.   Yes Historical Provider, MD     Allergies:  Allergies  Allergen Reactions  . Shellfish Allergy Nausea And Vomiting  . Cefzil [Cefprozil] Rash  . Penicillins Rash    Has patient had a PCN reaction causing immediate rash, facial/tongue/throat swelling, SOB or lightheadedness with hypotension: NO Has patient had a PCN reaction causing severe rash involving mucus membranes or skin necrosis: NO Has patient had a PCN reaction that required hospitalization: NO Has patient had a PCN reaction occurring within the last 10 years: NO If all of the above answers are "NO", then may proceed with Cephalosporin use.   . Sulfa Antibiotics Rash    Social History:   reports that she has never smoked. She has never used smokeless tobacco. She reports that she does not drink alcohol or use illicit drugs.  Family History: Family History  Problem Relation Age of Onset  . Pancreatitis Father 30    hereditary  . Hypertension Father   . GER disease Father   . Breast cancer Maternal Aunt   . Congenital heart disease Maternal Grandfather   . Prostate cancer Maternal Grandfather   . Alzheimer's disease Maternal Grandfather   . Hypothyroidism Mother      Physical Exam: Filed Vitals:   11/07/15 0400 11/07/15 0500 11/07/15 0600 11/07/15 0743  BP: 110/61 102/55 92/70   Pulse: 125 113 107   Temp: 98.5 F (36.9 C)   97.5 F (36.4 C)  TempSrc: Oral   Oral  Resp: 20 16 19    Height:      Weight:      SpO2: 96% 98% 98%    Blood pressure 92/70, pulse  107, temperature 97.5 F (36.4 C), temperature source Oral, resp. rate 19, height 5\' 2"  (1.575 m), weight 53.5 kg (117 lb 15.1 oz), last menstrual period 10/23/2015, SpO2 98 %.  GEN:  Pleasant  patient lying in the stretcher in no acute distress; cooperative with exam. PSYCH:  alert and oriented x4; does not appear anxious or depressed; affect is appropriate. HEENT: Mucous membranes pink and anicteric; PERRLA; EOM intact; no cervical lymphadenopathy nor thyromegaly or carotid bruit; no JVD; There were no stridor. Neck is very supple. Breasts:: Not examined CHEST WALL: No tenderness CHEST: Normal respiration, clear to auscultation bilaterally.  HEART: Regular rate and rhythm.  There are no murmur, rub, or gallops.   BACK: No kyphosis or scoliosis; no CVA tenderness ABDOMEN: soft and non-tender; no masses, no organomegaly, normal abdominal bowel sounds; no pannus; no intertriginous candida. There is no rebound and no distention. Rectal Exam: Not done EXTREMITIES: No bone or joint deformity; age-appropriate arthropathy of the hands and knees; no edema; no ulcerations.  There  is no calf tenderness. Genitalia: not examined PULSES: 2+ and symmetric SKIN: Normal hydration no rash or ulceration CNS: Cranial nerves 2-12 grossly intact no focal lateralizing neurologic deficit.  Speech is fluent; uvula elevated with phonation, facial symmetry and tongue midline. DTR are normal bilaterally, cerebella exam is intact, barbinski is negative and strengths are equaled bilaterally.  No sensory loss.   Labs on Admission:  Basic Metabolic Panel:  Recent Labs Lab 11/06/15 2150 11/07/15 0106 11/07/15 0424  NA 132* 136 136  K 4.7 5.1 5.0  CL 100* 106 111  CO2 10* 9* 10*  GLUCOSE 427* 365* 198*  BUN 16 15 12   CREATININE 0.91 0.92 0.76  CALCIUM 9.5 9.1 9.0  MG 1.8  --   --    CBC:  Recent Labs Lab 11/06/15 2150  WBC 15.8*  NEUTROABS 13.0*  HGB 12.8  HCT 38.9  MCV 89.4  PLT 380    CBG:  Recent Labs Lab 11/07/15 0329 11/07/15 0429 11/07/15 0533 11/07/15 0632 11/07/15 0734  GLUCAP 217* 180* 190* 166* 127*   Assessment/Plan Present on Admission:  . DKA (diabetic ketoacidoses) ([place]) . Sinus tachycardia ([place]) . Hyponatremia  PLAN:  DKA:  Continue with IV dextrose and IV insulin.  Follow Basic metabolic profile. She needs more IVF.  Tachycardia:  She has hx of tachycardia and hypotension.  Will check TSH and cortisol level. Hereditary pancreatitis:  Will try some clear liquid today.    Other plans as per orders.  Code Status:  FULL Haskel Khan, MD.  FACP [facility] Pager [phone] 7pm to 7am.  11/07/2015, 8:04 AM

## 2015-11-07 NOTE — ED Notes (Signed)
[id number].

## 2015-11-07 NOTE — Progress Notes (Signed)
Inpatient Diabetes Program Recommendations  AACE/ADA: New Consensus Statement on Inpatient Glycemic Control (2015)  Target Ranges:  Prepandial:   less than 140 mg/dL      Peak postprandial:   less than 180 mg/dL (1-2 hours)      Critically ill patients:  140 - 180 mg/dL   Review of Glycemic Control  Diabetes history: DM 1 (Sees Dr. Dorris Fetch for DM management) Outpatient Diabetes medications: Tresiba 30, Novolog 12-18 units TID Current orders for Inpatient glycemic control: Insulin gtt  Inpatient Diabetes Program Recommendations:   CO2 still low this am IV insulin still apropriate until CO2 corrects. When it is time to transition off IV insulin, please consider ordering Lantus 30 units Q24hrs, Novolog Sensitive 0-9 units TID + HS scale + Novolog 4-5 units TID meal coverage.  Thanks,  Tama Headings RN, MSN, [facility] Inpatient Diabetes Coordinator Team Pager [phone] (8a-5p)

## 2015-11-07 NOTE — Care Management Note (Signed)
Case Management Note  Patient Details  Name: Linda Walls MRN: [id number] Date of Birth: 10-Sep-1990  Subjective/Objective:                  Pt admitted with DKA. Pt is from home, lives with parents and is ind with ADL's. Pt has PCP, transportation and no difficulty affording medications.   Action/Plan: Pt plans to return home with self care. No CM needs anticipated.   Expected Discharge Date:     11/11/2015             Expected Discharge Plan:  Home/Self Care  In-House Referral:  NA  Discharge planning Services  CM Consult  Post Acute Care Choice:  NA Choice offered to:  NA  DME Arranged:    DME Agency:     HH Arranged:    HH Agency:     Status of Service:  Completed, signed off  Medicare Important Message Given:    Date Medicare IM Given:    Medicare IM give by:    Date Additional Medicare IM Given:    Additional Medicare Important Message give by:     If discussed at [place] of Stay Meetings, dates discussed:    Additional Comments:  Sherald Barge, RN 11/07/2015, 9:55 AM

## 2015-11-08 LAB — GLUCOSE, CAPILLARY
GLUCOSE-CAPILLARY: 126 mg/dL — AB (ref 65–99)
GLUCOSE-CAPILLARY: 183 mg/dL — AB (ref 65–99)
GLUCOSE-CAPILLARY: 166 mg/dL — AB (ref 65–99)
GLUCOSE-CAPILLARY: 138 mg/dL — AB (ref 65–99)
Glucose-Capillary: 110 mg/dL — ABNORMAL HIGH (ref 65–99)
Glucose-Capillary: 125 mg/dL — ABNORMAL HIGH (ref 65–99)
Glucose-Capillary: 143 mg/dL — ABNORMAL HIGH (ref 65–99)
Glucose-Capillary: 152 mg/dL — ABNORMAL HIGH (ref 65–99)
Glucose-Capillary: 176 mg/dL — ABNORMAL HIGH (ref 65–99)
Glucose-Capillary: 180 mg/dL — ABNORMAL HIGH (ref 65–99)
Glucose-Capillary: 198 mg/dL — ABNORMAL HIGH (ref 65–99)
Glucose-Capillary: 225 mg/dL — ABNORMAL HIGH (ref 65–99)
Glucose-Capillary: 383 mg/dL — ABNORMAL HIGH (ref 65–99)

## 2015-11-08 LAB — BASIC METABOLIC PANEL
ANION GAP: 6 (ref 5–15)
Anion gap: 5 (ref 5–15)
Anion gap: 7 (ref 5–15)
BUN: 4 mg/dL — ABNORMAL LOW (ref 6–20)
BUN: <5 mg/dL — ABNORMAL LOW (ref 6–20)
CALCIUM: 8.5 mg/dL — AB (ref 8.9–10.3)
CALCIUM: 8.6 mg/dL — AB (ref 8.9–10.3)
CHLORIDE: 112 mmol/L — AB (ref 101–111)
CHLORIDE: 109 mmol/L (ref 101–111)
CO2: 18 mmol/L — ABNORMAL LOW (ref 22–32)
CO2: 20 mmol/L — AB (ref 22–32)
CO2: 23 mmol/L (ref 22–32)
CREATININE: 0.35 mg/dL — AB (ref 0.44–1.00)
Calcium: 8.4 mg/dL — ABNORMAL LOW (ref 8.9–10.3)
Chloride: 111 mmol/L (ref 101–111)
Creatinine, Ser: 0.47 mg/dL (ref 0.44–1.00)
Creatinine, Ser: 0.48 mg/dL (ref 0.44–1.00)
GFR calc Af Amer: >60 mL/min (ref >60)
GFR calc Af Amer: >60 mL/min (ref >60)
GFR calc non Af Amer: >60 mL/min (ref >60)
GFR calc non Af Amer: >60 mL/min (ref >60)
GFR calc non Af Amer: >60 mL/min (ref >60)
GLUCOSE: 136 mg/dL — AB (ref 65–99)
GLUCOSE: 269 mg/dL — AB (ref 65–99)
Glucose, Bld: 128 mg/dL — ABNORMAL HIGH (ref 65–99)
Potassium: 3.3 mmol/L — ABNORMAL LOW (ref 3.5–5.1)
Potassium: 3.4 mmol/L — ABNORMAL LOW (ref 3.5–5.1)
Potassium: 3.8 mmol/L (ref 3.5–5.1)
Sodium: 136 mmol/L (ref 135–145)
Sodium: 137 mmol/L (ref 135–145)
Sodium: 138 mmol/L (ref 135–145)

## 2015-11-08 LAB — MAGNESIUM: Magnesium: 1.5 mg/dL — ABNORMAL LOW (ref 1.7–2.4)

## 2015-11-08 LAB — TSH: TSH: 2.373 u[IU]/mL (ref 0.350–4.500)

## 2015-11-08 LAB — CORTISOL: Cortisol, Plasma: 1.7 ug/dL

## 2015-11-08 MED ORDER — INSULIN GLARGINE 100 UNIT/ML ~~LOC~~ SOLN
20.0000 [IU] | Freq: Every day | SUBCUTANEOUS | Status: DC
  Administered 2015-11-08: 20 [IU] via SUBCUTANEOUS
  Filled 2015-11-08 (×4): qty 0.2

## 2015-11-08 MED ORDER — INSULIN ASPART 100 UNIT/ML ~~LOC~~ SOLN
0.0000 [IU] | Freq: Three times a day (TID) | SUBCUTANEOUS | Status: DC
  Administered 2015-11-08: 2 [IU] via SUBCUTANEOUS
  Administered 2015-11-09: 3 [IU] via SUBCUTANEOUS
  Administered 2015-11-09: 11 [IU] via SUBCUTANEOUS

## 2015-11-08 MED ORDER — INSULIN ASPART 100 UNIT/ML ~~LOC~~ SOLN
4.0000 [IU] | Freq: Three times a day (TID) | SUBCUTANEOUS | Status: DC
  Administered 2015-11-08 – 2015-11-09 (×4): 4 [IU] via SUBCUTANEOUS

## 2015-11-08 MED ORDER — POTASSIUM CHLORIDE CRYS ER 20 MEQ PO TBCR
20.0000 meq | EXTENDED_RELEASE_TABLET | Freq: Every day | ORAL | Status: DC
  Administered 2015-11-08 – 2015-11-10 (×3): 20 meq via ORAL
  Filled 2015-11-08 (×3): qty 1

## 2015-11-08 MED ORDER — INSULIN ASPART 100 UNIT/ML ~~LOC~~ SOLN
0.0000 [IU] | Freq: Every day | SUBCUTANEOUS | Status: DC
  Administered 2015-11-08: 5 [IU] via SUBCUTANEOUS

## 2015-11-08 MED ORDER — INSULIN GLARGINE 100 UNIT/ML ~~LOC~~ SOLN
20.0000 [IU] | Freq: Every day | SUBCUTANEOUS | Status: DC
  Filled 2015-11-08: qty 0.2

## 2015-11-08 NOTE — Progress Notes (Signed)
[facility] PROGRESS NOTE  Linda Walls [id number] DOB: [birth year]    PCP:   [name], MD   HPI:   Linda Walls is an 25 y.o. 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 She was started on IV insulin and IVF. She is feeling a little better, but still has abdominal pain. Her nausea has improved.Her AG finally closed to 5, with Bicarb of 20 this morning.  She has no other complaints.    Rewiew of Systems:  Constitutional: Negative for malaise, fever and chills. No significant weight loss or weight gain Eyes: Negative for eye pain, redness and discharge, diplopia, visual changes, or flashes of light. ENMT: Negative for ear pain, hoarseness, nasal congestion, sinus pressure and sore throat. No headaches; tinnitus, drooling, or problem swallowing. Cardiovascular: Negative for chest pain, palpitations, diaphoresis, dyspnea and peripheral edema. ; No orthopnea, PND Respiratory: Negative for cough, hemoptysis, wheezing and stridor. No pleuritic chestpain. Gastrointestinal: Negative for nausea, vomiting, diarrhea, constipation, abdominal pain, melena, blood in stool, hematemesis, jaundice and rectal bleeding.    Genitourinary: Negative for frequency, dysuria, incontinence,flank pain and hematuria; Musculoskeletal: Negative for back pain and neck pain. Negative for swelling and trauma.;  Skin: . Negative for pruritus, rash, abrasions, bruising and skin lesion.; ulcerations Neuro: Negative for headache, lightheadedness and neck stiffness. Negative for weakness, altered level of consciousness , altered mental status, extremity weakness, burning feet, involuntary movement, seizure and syncope.  Psych: negative for anxiety, depression, insomnia, tearfulness, panic attacks, hallucinations, paranoia, suicidal or homicidal ideation    Past Medical History   Diagnosis Date  . Hereditary pancreatitis   . DM type 1 (diabetes mellitus, type 1) ([place])   . Hernia   . GERD (gastroesophageal reflux disease)   . Jaundice   . DKA (diabetic ketoacidoses) [facility])     Past Surgical History  Procedure Laterality Date  . Hernia repair      As infant.  . Bile duct stent placement  2006 at [facility]  . Pancreatic cyst drainage  at age 88 year    Medications:  HOME MEDS: Prior to Admission medications   Medication Sig Start Date End Date Taking? Authorizing Provider  CREON 24000 units CPEP Take 1 capsule by mouth 3 (three) times daily with meals. 10/30/15  Yes Historical Provider, MD  Cyanocobalamin (B-12 PO) Take 1 tablet by mouth daily.   Yes Historical Provider, MD  ferrous sulfate 325 (65 FE) MG tablet Take 325 mg by mouth daily with breakfast.   Yes Historical Provider, MD  GLUCAGON EMERGENCY 1 MG injection Inject 1 mg as directed once. 03/13/15  Yes Historical Provider, MD  hyoscyamine (LEVSIN SL) 0.125 MG SL tablet PLACE 1 TABLET UNDER THE TONGUE EVERY 4 HOURS AS NEEDED FOR PAIN. 09/17/15  Yes [name], MD  ibuprofen (ADVIL,MOTRIN) 200 MG tablet Take 400 mg by mouth every 6 (six) hours as needed. Reported on 11/06/2015   Yes Historical Provider, MD  insulin aspart (NOVOLOG) 100 UNIT/ML FlexPen Inject 12-18 Units into the skin 3 (three) times daily with meals. 09/06/15  Yes [name], MD  Insulin Degludec (TRESIBA FLEXTOUCH) 100 UNIT/ML SOPN Inject 30 Units into the skin at bedtime. Patient taking differently: Inject 30 Units into the skin daily.  06/06/15  Yes [name], MD  Multiple Vitamins-Minerals (MULTIVITAMINS THER. W/MINERALS) TABS Take 1 tablet by  mouth daily. For energy   Yes Historical Provider, MD  omeprazole (PRILOSEC) 20 MG capsule Take 1 capsule (20 mg total) by mouth daily. Patient taking differently: Take 20 mg by mouth daily as needed (Acid Reflux).  01/31/15  Yes [name], MD  oxyCODONE-acetaminophen  (PERCOCET) 7.5-325 MG tablet Take one tablet up to BID prn pain Patient taking differently: Take 1 tablet by mouth 2 (two) times daily as needed for moderate pain or severe pain.  09/25/15  Yes [name], MD  promethazine (PHENERGAN) 25 MG tablet TAKE 1 TABLET BY MOUTH EVERY 6 HOURS AS NEEDED FOR NAUSEA. 10/29/15  Yes [name], MD  SUMAtriptan (IMITREX) 50 MG tablet TAKE 1 TABLET BY MOUTH ONCE. MAY REPEAT IN 2 HOURS IF HEADACHE PERSISTS OR RECURS. 11/06/15  Yes Nilda Simmer, NP  vitamin C (ASCORBIC ACID) 500 MG tablet Take 500 mg by mouth daily.   Yes Historical Provider, MD     Allergies:  Allergies  Allergen Reactions  . Shellfish Allergy Nausea And Vomiting  . Cefzil [Cefprozil] Rash  . Penicillins Rash    Has patient had a PCN reaction causing immediate rash, facial/tongue/throat swelling, SOB or lightheadedness with hypotension: NO Has patient had a PCN reaction causing severe rash involving mucus membranes or skin necrosis: NO Has patient had a PCN reaction that required hospitalization: NO Has patient had a PCN reaction occurring within the last 10 years: NO If all of the above answers are "NO", then may proceed with Cephalosporin use.   . Sulfa Antibiotics Rash    Social History:   reports that she has never smoked. She has never used smokeless tobacco. She reports that she does not drink alcohol or use illicit drugs.  Family History: Family History  Problem Relation Age of Onset  . Pancreatitis Father 30    hereditary  . Hypertension Father   . GER disease Father   . Breast cancer Maternal Aunt   . Congenital heart disease Maternal Grandfather   . Prostate cancer Maternal Grandfather   . Alzheimer's disease Maternal Grandfather   . Hypothyroidism Mother      Physical Exam: Filed Vitals:   11/08/15 0200 11/08/15 0300 11/08/15 0400 11/08/15 0500  BP: 96/55 96/67 109/66 103/70  Pulse: 89 71 83 67  Temp:   97.2 F (36.2 C)   TempSrc:   Oral   Resp:  12 22 22 11   Height:      Weight:    56 kg (123 lb 7.3 oz)  SpO2: 98% 99% 98% 99%   Blood pressure 103/70, pulse 67, temperature 97.2 F (36.2 C), temperature source Oral, resp. rate 11, height 5\' 2"  (1.575 m), weight 56 kg (123 lb 7.3 oz), last menstrual period 10/23/2015, SpO2 99 %.  GEN:  Pleasant  patient lying in the stretcher in no acute distress; cooperative with exam. PSYCH:  alert and oriented x4; does not appear anxious or depressed; affect is appropriate. HEENT: Mucous membranes pink and anicteric; PERRLA; EOM intact; no cervical lymphadenopathy nor thyromegaly or carotid bruit; no JVD; There were no stridor. Neck is very supple. Breasts:: Not examined CHEST WALL: No tenderness CHEST: Normal respiration, clear to auscultation bilaterally.  HEART: Regular rate and rhythm.  There are no murmur, rub, or gallops.   BACK: No kyphosis or scoliosis; no CVA tenderness ABDOMEN: soft and non-tender; no masses, no organomegaly, normal abdominal bowel sounds; no pannus; no intertriginous candida. There is no rebound and no distention. Rectal Exam: Not  done EXTREMITIES: No bone or joint deformity; age-appropriate arthropathy of the hands and knees; no edema; no ulcerations.  There is no calf tenderness. Genitalia: not examined PULSES: 2+ and symmetric SKIN: Normal hydration no rash or ulceration CNS: Cranial nerves 2-12 grossly intact no focal lateralizing neurologic deficit.  Speech is fluent; uvula elevated with phonation, facial symmetry and tongue midline. DTR are normal bilaterally, cerebella exam is intact, barbinski is negative and strengths are equaled bilaterally.  No sensory loss.   Labs on Admission:  Basic Metabolic Panel:  Recent Labs Lab 11/06/15 2150  11/07/15 0823 11/07/15 1224 11/07/15 1647 11/07/15 2337 11/08/15 0413  NA 132*  < > 134* 134* 137 136 137  K 4.7  < > 4.3 4.5 3.8 3.4* 3.3*  CL 100*  < > 109 109 114* 111 112*  CO2 10*  < > 16* 11* 16* 18* 20*   GLUCOSE 427*  < > 129* 213* 124* 128* 136*  BUN 16  < > 9 7 5* 4* <5*  CREATININE 0.91  < > 0.50 0.63 0.45 0.48 0.35*  CALCIUM 9.5  < > 9.2 8.6* 8.5* 8.4* 8.5*  MG 1.8  --   --   --   --   --   --   < > = values in this interval not displayed. Liver Function Tests: CBC:  Recent Labs Lab 11/06/15 2150  WBC 15.8*  NEUTROABS 13.0*  HGB 12.8  HCT 38.9  MCV 89.4  PLT 380   CBG:  Recent Labs Lab 11/08/15 0232 11/08/15 0322 11/08/15 0431 11/08/15 0602 11/08/15 0702  GLUCAP 176* 143* 125* 126* 183*   Assessment/Plan Present on Admission:  . DKA (diabetic ketoacidoses) ([place]) . Sinus tachycardia ([place]) . Hyponatremia  PLAN:  DKA:  Resolved.  Will transition her insulin drip to Lantus, coverage and meal coverage.  D/C IVF and advance her diet to carb modified.  Transfer to general medical floor.  Hereditary pancreatitis:  Will advance diet.  Resume her pancreatic enzymes.   Hypokalemia:  Will replete.   Check Mag.   Other plans as per orders. Code Status: FULL Haskel Khan, MD.  FACP [facility] Pager [phone] 7pm to 7am.  11/08/2015, 8:02 AM

## 2015-11-08 NOTE — Progress Notes (Signed)
[place]link Rn/md updated  Pt C02=20

## 2015-11-08 NOTE — Progress Notes (Signed)
Pt left the floor with nursing staff and family.  Pt in no distress.  Belongings, paper chart, and medications sent with patient.  Report called to Dept. 300 RN.   Vitals signs as follows:  Temp: 97.2 F (36.2 C) (05/11 1217) Temp Source: Oral (05/11 1217) BP: 103/66 mmHg (05/11 0800) Pulse Rate: 94 (05/11 0800) Respirations: 16

## 2015-11-09 LAB — BASIC METABOLIC PANEL
Anion gap: 4 — ABNORMAL LOW (ref 5–15)
BUN: <5 mg/dL — ABNORMAL LOW (ref 6–20)
CHLORIDE: 109 mmol/L (ref 101–111)
CO2: 25 mmol/L (ref 22–32)
CREATININE: 0.47 mg/dL (ref 0.44–1.00)
Calcium: 8.8 mg/dL — ABNORMAL LOW (ref 8.9–10.3)
GFR calc non Af Amer: >60 mL/min (ref >60)
Glucose, Bld: 141 mg/dL — ABNORMAL HIGH (ref 65–99)
POTASSIUM: 3.3 mmol/L — AB (ref 3.5–5.1)
Sodium: 138 mmol/L (ref 135–145)

## 2015-11-09 LAB — ACTH STIMULATION, 3 TIME POINTS
CORTISOL 60 MIN: 26 ug/dL
CORTISOL BASE: 5.3 ug/dL
Cortisol, 30 Min: 22.6 ug/dL

## 2015-11-09 LAB — GLUCOSE, CAPILLARY
GLUCOSE-CAPILLARY: 172 mg/dL — AB (ref 65–99)
GLUCOSE-CAPILLARY: 108 mg/dL — AB (ref 65–99)
GLUCOSE-CAPILLARY: 312 mg/dL — AB (ref 65–99)
Glucose-Capillary: 143 mg/dL — ABNORMAL HIGH (ref 65–99)

## 2015-11-09 MED ORDER — MAGNESIUM CHLORIDE 64 MG PO TBEC
1.0000 | DELAYED_RELEASE_TABLET | Freq: Two times a day (BID) | ORAL | Status: DC
  Filled 2015-11-09: qty 1

## 2015-11-09 MED ORDER — COSYNTROPIN 0.25 MG IJ SOLR
0.2500 mg | Freq: Once | INTRAMUSCULAR | Status: AC
  Administered 2015-11-09: 0.25 mg via INTRAVENOUS
  Filled 2015-11-09: qty 0.25

## 2015-11-09 MED ORDER — INSULIN GLARGINE 100 UNIT/ML ~~LOC~~ SOLN
26.0000 [IU] | Freq: Every day | SUBCUTANEOUS | Status: DC
  Administered 2015-11-09: 26 [IU] via SUBCUTANEOUS
  Filled 2015-11-09 (×3): qty 0.26

## 2015-11-09 MED ORDER — MAGNESIUM SULFATE 2 GM/50ML IV SOLN
2.0000 g | Freq: Once | INTRAVENOUS | Status: AC
  Administered 2015-11-09: 2 g via INTRAVENOUS
  Filled 2015-11-09: qty 50

## 2015-11-09 MED ORDER — MAGNESIUM OXIDE 400 (241.3 MG) MG PO TABS
400.0000 mg | ORAL_TABLET | Freq: Two times a day (BID) | ORAL | Status: DC
  Administered 2015-11-09 – 2015-11-10 (×3): 400 mg via ORAL
  Filled 2015-11-09 (×3): qty 1

## 2015-11-09 NOTE — Progress Notes (Signed)
[facility] PROGRESS NOTE  CATARINA TIGUE [id number] DOB: 11/22/1990    PCP:   [name], MD   HPI:  Linda Walls is an 25 y.o. 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 She was started on IV insulin and IVF. She is feeling a little better, but still has abdominal pain. Her nausea has improved.Her AG finally closed to 5, with Bicarb of 20 this morning. She has no other complaints. Her Insulin drip was transition nicely to Lantus with meal coverage and SSI.  Her BS was elevated last night, but controlled this am to 140.  She has hx of low BP and orthostasis, and after out of DKA, am cortisol was found to be quite low at 1.7.  Her Mag was also low at 1.5 mE/L.  She is able to eat now.   Rewiew of Systems:  Constitutional: Negative for malaise, fever and chills. No significant weight loss or weight gain Eyes: Negative for eye pain, redness and discharge, diplopia, visual changes, or flashes of light. ENMT: Negative for ear pain, hoarseness, nasal congestion, sinus pressure and sore throat. No headaches; tinnitus, drooling, or problem swallowing. Cardiovascular: Negative for chest pain, palpitations, diaphoresis, dyspnea and peripheral edema. ; No orthopnea, PND Respiratory: Negative for cough, hemoptysis, wheezing and stridor. No pleuritic chestpain. Gastrointestinal: Negative for nausea, vomiting, diarrhea, constipation, abdominal pain, melena, blood in stool, hematemesis, jaundice and rectal bleeding.    Genitourinary: Negative for frequency, dysuria, incontinence,flank pain and hematuria; Musculoskeletal: Negative for back pain and neck pain. Negative for swelling and trauma.;  Skin: . Negative for pruritus, rash, abrasions, bruising and skin lesion.; ulcerations Neuro: Negative for headache, lightheadedness and neck stiffness. Negative for  weakness, altered level of consciousness , altered mental status, extremity weakness, burning feet, involuntary movement, seizure and syncope.  Psych: negative for anxiety, depression, insomnia, tearfulness, panic attacks, hallucinations, paranoia, suicidal or homicidal ideation    Past Medical History  Diagnosis Date  . Hereditary pancreatitis   . DM type 1 (diabetes mellitus, type 1) ([place])   . Hernia   . GERD (gastroesophageal reflux disease)   . Jaundice   . DKA (diabetic ketoacidoses) [facility])     Past Surgical History  Procedure Laterality Date  . Hernia repair      As infant.  . Bile duct stent placement  2006 at [facility]  . Pancreatic cyst drainage  at age 25 year    Medications:  HOME MEDS: Prior to Admission medications   Medication Sig Start Date End Date Taking? Authorizing Provider  CREON 24000 units CPEP Take 1 capsule by mouth 3 (three) times daily with meals. 10/30/15  Yes Historical Provider, MD  Cyanocobalamin (B-12 PO) Take 1 tablet by mouth daily.   Yes Historical Provider, MD  ferrous sulfate 325 (65 FE) MG tablet Take 325 mg by mouth daily with breakfast.   Yes Historical Provider, MD  GLUCAGON EMERGENCY 1 MG injection Inject 1 mg as directed once. 03/13/15  Yes Historical Provider, MD  hyoscyamine (LEVSIN SL) 0.125 MG SL tablet PLACE 1 TABLET UNDER THE TONGUE EVERY 4 HOURS AS NEEDED FOR PAIN. 09/17/15  Yes [name], MD  ibuprofen (ADVIL,MOTRIN) 200 MG tablet Take 400 mg by mouth every 6 (six) hours as needed. Reported on 11/06/2015   Yes Historical Provider, MD  insulin aspart (NOVOLOG) 100 UNIT/ML FlexPen Inject  12-18 Units into the skin 3 (three) times daily with meals. 09/06/15  Yes [name], MD  Insulin Degludec (TRESIBA FLEXTOUCH) 100 UNIT/ML SOPN Inject 30 Units into the skin at bedtime. Patient taking differently: Inject 30 Units into the skin daily.  06/06/15  Yes [name], MD  Multiple Vitamins-Minerals (MULTIVITAMINS THER.  W/MINERALS) TABS Take 1 tablet by mouth daily. For energy   Yes Historical Provider, MD  omeprazole (PRILOSEC) 20 MG capsule Take 1 capsule (20 mg total) by mouth daily. Patient taking differently: Take 20 mg by mouth daily as needed (Acid Reflux).  01/31/15  Yes [name], MD  oxyCODONE-acetaminophen (PERCOCET) 7.5-325 MG tablet Take one tablet up to BID prn pain Patient taking differently: Take 1 tablet by mouth 2 (two) times daily as needed for moderate pain or severe pain.  09/25/15  Yes [name], MD  promethazine (PHENERGAN) 25 MG tablet TAKE 1 TABLET BY MOUTH EVERY 6 HOURS AS NEEDED FOR NAUSEA. 10/29/15  Yes [name], MD  SUMAtriptan (IMITREX) 50 MG tablet TAKE 1 TABLET BY MOUTH ONCE. MAY REPEAT IN 2 HOURS IF HEADACHE PERSISTS OR RECURS. 11/06/15  Yes Nilda Simmer, NP  vitamin C (ASCORBIC ACID) 500 MG tablet Take 500 mg by mouth daily.   Yes Historical Provider, MD     Allergies:  Allergies  Allergen Reactions  . Shellfish Allergy Nausea And Vomiting  . Cefzil [Cefprozil] Rash  . Penicillins Rash    Has patient had a PCN reaction causing immediate rash, facial/tongue/throat swelling, SOB or lightheadedness with hypotension: NO Has patient had a PCN reaction causing severe rash involving mucus membranes or skin necrosis: NO Has patient had a PCN reaction that required hospitalization: NO Has patient had a PCN reaction occurring within the last 10 years: NO If all of the above answers are "NO", then may proceed with Cephalosporin use.   . Sulfa Antibiotics Rash    Social History:   reports that she has never smoked. She has never used smokeless tobacco. She reports that she does not drink alcohol or use illicit drugs.  Family History: Family History  Problem Relation Age of Onset  . Pancreatitis Father 30    hereditary  . Hypertension Father   . GER disease Father   . Breast cancer Maternal Aunt   . Congenital heart disease Maternal Grandfather   .  Prostate cancer Maternal Grandfather   . Alzheimer's disease Maternal Grandfather   . Hypothyroidism Mother      Physical Exam: Filed Vitals:   11/08/15 1217 11/08/15 1300 11/08/15 2200 11/09/15 0642  BP:  112/79 113/71 113/68  Pulse:  74 72 70  Temp: 97.2 F (36.2 C)  98.3 F (36.8 C) 98.2 F (36.8 C)  TempSrc: Oral  Oral Oral  Resp:  15 20 20   Height:      Weight:      SpO2:  99% 99% 100%   Blood pressure 113/68, pulse 70, temperature 98.2 F (36.8 C), temperature source Oral, resp. rate 20, height 5\' 2"  (1.575 m), weight 56 kg (123 lb 7.3 oz), last menstrual period 10/23/2015, SpO2 100 %.  GEN:  Pleasant  patient lying in the stretcher in no acute distress; cooperative with exam. PSYCH:  alert and oriented x4; does not appear anxious or depressed; affect is appropriate. HEENT: Mucous membranes pink and anicteric; PERRLA; EOM intact; no cervical lymphadenopathy nor thyromegaly or carotid bruit; no JVD; There were no stridor. Neck is very supple. Breasts::  Not examined CHEST WALL: No tenderness CHEST: Normal respiration, clear to auscultation bilaterally.  HEART: Regular rate and rhythm.  There are no murmur, rub, or gallops.   BACK: No kyphosis or scoliosis; no CVA tenderness ABDOMEN: soft and non-tender; no masses, no organomegaly, normal abdominal bowel sounds; no pannus; no intertriginous candida. There is no rebound and no distention. Rectal Exam: Not done EXTREMITIES: No bone or joint deformity; age-appropriate arthropathy of the hands and knees; no edema; no ulcerations.  There is no calf tenderness. Genitalia: not examined PULSES: 2+ and symmetric SKIN: Normal hydration no rash or ulceration CNS: Cranial nerves 2-12 grossly intact no focal lateralizing neurologic deficit.  Speech is fluent; uvula elevated with phonation, facial symmetry and tongue midline. DTR are normal bilaterally, cerebella exam is intact, barbinski is negative and strengths are equaled bilaterally.   No sensory loss.   Labs on Admission:  Basic Metabolic Panel:  Recent Labs Lab 11/06/15 2150  11/07/15 1647 11/07/15 2337 11/08/15 0413 11/08/15 1936 11/09/15 0625  NA 132*  < > 137 136 137 138 138  K 4.7  < > 3.8 3.4* 3.3* 3.8 3.3*  CL 100*  < > 114* 111 112* 109 109  CO2 10*  < > 16* 18* 20* 23 25  GLUCOSE 427*  < > 124* 128* 136* 269* 141*  BUN 16  < > 5* 4* <5* <5* <5*  CREATININE 0.91  < > 0.45 0.48 0.35* 0.47 0.47  CALCIUM 9.5  < > 8.5* 8.4* 8.5* 8.6* 8.8*  MG 1.8  --   --   --  1.5*  --   --   < > = values in this interval not displayed. Liver Function Tests: CBC:  Recent Labs Lab 11/06/15 2150  WBC 15.8*  NEUTROABS 13.0*  HGB 12.8  HCT 38.9  MCV 89.4  PLT 380   Cardiac Enzymes: No results for input(s): CKTOTAL, CKMB, CKMBINDEX, TROPONINI in the last 168 hours.  CBG:  Recent Labs Lab 11/08/15 1033 11/08/15 1139 11/08/15 1638 11/08/15 2157 11/09/15 0718  GLUCAP 166* 138* 110* 383* 172*    Assessment/Plan Present on Admission:  . DKA (diabetic ketoacidoses) ([place]) . Sinus tachycardia ([place]) . Hyponatremia  PLAN:  DKA:  Resolved.  Doing well.  Will increase her basal Lantus to 26 units.  Hypocortisol level:  This was an am cortisol level.  Suspicion for adrenal insufficiency with her hx of clinical symptoms.              I will obtain a low cortrosyn stim test.   HypoMag:  Will supplement with IV and oral.  Other plans as per orders. Code Status: FULL Haskel Khan, MD.  FACP [facility] Pager [phone] 7pm to 7am.  11/09/2015, 10:04 AM

## 2015-11-09 NOTE — Progress Notes (Signed)
Inpatient Diabetes Program Recommendations  AACE/ADA: New Consensus Statement on Inpatient Glycemic Control (2015)  Target Ranges:  Prepandial:   less than 140 mg/dL      Peak postprandial:   less than 180 mg/dL (1-2 hours)      Critically ill patients:  140 - 180 mg/dL   Note bedtime glucose 383 mg/dl last night. Unsure why. Patient corrected with Novolog HS scale in the 140's this am. Watching trends. No recs at this time.  Thanks,  Tama Headings RN, MSN, [facility] Inpatient Diabetes Coordinator Team Pager [phone] (8a-5p)

## 2015-11-10 LAB — GLUCOSE, CAPILLARY: GLUCOSE-CAPILLARY: 101 mg/dL — AB (ref 65–99)

## 2015-11-10 MED ORDER — POTASSIUM CHLORIDE CRYS ER 20 MEQ PO TBCR: 20.0000 meq | EXTENDED_RELEASE_TABLET | Freq: Every day | ORAL | Status: DC

## 2015-11-10 MED ORDER — MAGNESIUM OXIDE 400 (241.3 MG) MG PO TABS: 400.0000 mg | ORAL_TABLET | Freq: Every day | ORAL | Status: DC

## 2015-11-10 NOTE — Discharge Summary (Signed)
Physician Discharge Summary  Linda Walls [id number] DOB: [birth year] DOA: 11/06/2015  PCP: [name], MD  Admit date: 11/06/2015 Discharge date: 11/10/2015  Time spent: 35 minutes  Recommendations for Outpatient Follow-up:  1. Follow up with your PCP in one week.  2. Follow up with your endocrinologist as scheduled.    Discharge Diagnoses:  Active Problems:   Hyponatremia   Sinus tachycardia (H[facility])   DKA (diabetic ketoacidoses) ([place])   Discharge Condition: Improved.  Able to eat.  DKA resolved.   Diet recommendation: carb modified diet.   Filed Weights   11/06/15 2037 11/07/15 0100 11/08/15 0500  Weight: 56.7 kg (125 lb) 53.5 kg (117 lb 15.1 oz) 56 kg (123 lb 7.3 oz)    History of present illness:  Patient was admitted for DKA by Linda Walls on Nov 06, 2015.  As per his H and P:  " Linda Walls is a 25 y.o. female,Dm type 1, apparently c/o n/v, and used a ketone strip and it was positive and presented to ED for evaluation. Pt found to have Co2 of 10 . Pt will be admitted for DKA.   Hospital Course:  HPI: Linda Walls is an 25 y.o. 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 She was started on IV insulin and IVF. She felt better and her abdominal pain and nausea resolved.  Her AG finally closed to 5, with Bicarb of 20. Her Insulin drip was transition nicely to Linda Walls with meal coverage and SSI. Her BS was elevated last night, but controlled with adjust ing her Linda Walls. Given she has hx of low BP and orthostasis, and after out of DKA, am cortisol was found to be quite low at 1.7. Her Mag was also low at 1.5 mE/L.She was given Magnesium and K supplement, and will continue with oral supplement of both for another 10 days.  With her cortisol being very low, a Cosytropin stimulation test was performed.  A low dose ACTH was given, and she did have  cortisol respond, with baseline level of 5, rising to 22 at 30 min, and to 26 at one hour.  She will follow up with her PCP and her endocrinologist.  I will defer low dose ACE I for renal protection to her PCP and Linda Dorris Fetch.  She will be discharged on her Tyler Aas of 30 units in the afternoon, 12 units of meal coverage as before, and I adjusted her ISF to 30 instead of 50.   Thank you so much and Good Day.    Discharge Exam: Filed Vitals:   11/09/15 2110 11/10/15 0518  BP: 103/70 111/70  Pulse: 80 67  Temp: 98.7 F (37.1 C) 98.4 F (36.9 C)  Resp: 20 18   Discharge Instructions   Discharge Instructions    Diet - low sodium heart healthy    Complete by:  As directed      Discharge instructions    Complete by:  As directed   Take your insulin as before:   30 units in the afternoon of the [place].   Take 12 units for meal coverage.  Use ISF of 30 instead of 50 ( use 1 unit for every 30 points above 150 for correction).   See your PCP and Linda Dorris Fetch in follow up next week.   Take Magnesium and Potassium for another 10 days.  Thank you so much.     Increase activity slowly    Complete by:  As directed           Current Discharge Medication List    START taking these medications   Details  magnesium oxide (MAG-OX) 400 (241.3 Mg) MG tablet Take 1 tablet (400 mg total) by mouth daily. Qty: 10 tablet, Refills: 0    potassium chloride SA (K-DUR,KLOR-CON) 20 MEQ tablet Take 1 tablet (20 mEq total) by mouth daily. Qty: 10 tablet, Refills: 0      CONTINUE these medications which have NOT CHANGED   Details  CREON 24000 units CPEP Take 1 capsule by mouth 3 (three) times daily with meals.    Cyanocobalamin (B-12 PO) Take 1 tablet by mouth daily.    ferrous sulfate 325 (65 FE) MG tablet Take 325 mg by mouth daily with breakfast.    GLUCAGON EMERGENCY 1 MG injection Inject 1 mg as directed once.    hyoscyamine (LEVSIN SL) 0.125 MG SL tablet PLACE 1 TABLET UNDER THE TONGUE EVERY 4 HOURS AS  NEEDED FOR PAIN. Qty: 30 tablet, Refills: 0    insulin aspart (NOVOLOG) 100 UNIT/ML FlexPen Inject 12-18 Units into the skin 3 (three) times daily with meals. Qty: 15 mL, Refills: 3    Insulin Degludec (TRESIBA FLEXTOUCH) 100 UNIT/ML SOPN Inject 30 Units into the skin at bedtime. Qty: 3 pen, Refills: 2    Multiple Vitamins-Minerals (MULTIVITAMINS THER. W/MINERALS) TABS Take 1 tablet by mouth daily. For energy    omeprazole (PRILOSEC) 20 MG capsule Take 1 capsule (20 mg total) by mouth daily. Qty: 30 capsule, Refills: 5    oxyCODONE-acetaminophen (PERCOCET) 7.5-325 MG tablet Take one tablet up to BID prn pain Qty: 60 tablet, Refills: 0    SUMAtriptan (IMITREX) 50 MG tablet TAKE 1 TABLET BY MOUTH ONCE. MAY REPEAT IN 2 HOURS IF HEADACHE PERSISTS OR RECURS. Qty: 6 tablet, Refills: 0    vitamin C (ASCORBIC ACID) 500 MG tablet Take 500 mg by mouth daily.      STOP taking these medications     ibuprofen (ADVIL,MOTRIN) 200 MG tablet      promethazine (PHENERGAN) 25 MG tablet        Allergies  Allergen Reactions  . Shellfish Allergy Nausea And Vomiting  . Cefzil [Cefprozil] Rash  . Penicillins Rash    Has patient had a PCN reaction causing immediate rash, facial/tongue/throat swelling, SOB or lightheadedness with hypotension: NO Has patient had a PCN reaction causing severe rash involving mucus membranes or skin necrosis: NO Has patient had a PCN reaction that required hospitalization: NO Has patient had a PCN reaction occurring within the last 10 years: NO If all of the above answers are "NO", then may proceed with Cephalosporin use.   . Sulfa Antibiotics Rash      The results of significant diagnostics from this hospitalization (including imaging, microbiology, ancillary and laboratory) are listed below for reference.    Significant Diagnostic Studies: No results found.  Microbiology: Recent Results (from the past 240 hour(s))  MRSA PCR Screening     Status: None    Collection Time: 11/07/15 12:50 AM  Result Value Ref Range Status   MRSA by PCR NEGATIVE NEGATIVE Final    Comment:        The GeneXpert MRSA Assay (FDA approved for NASAL specimens only), is one component of a comprehensive MRSA colonization surveillance program. It is not intended to diagnose MRSA infection nor to guide or monitor  treatment for MRSA infections.      Labs: Basic Metabolic Panel:  Recent Labs Lab 11/06/15 2150  11/07/15 1647 11/07/15 2337 11/08/15 0413 11/08/15 1936 11/09/15 0625  NA 132*  < > 137 136 137 138 138  K 4.7  < > 3.8 3.4* 3.3* 3.8 3.3*  CL 100*  < > 114* 111 112* 109 109  CO2 10*  < > 16* 18* 20* 23 25  GLUCOSE 427*  < > 124* 128* 136* 269* 141*  BUN 16  < > 5* 4* <5* <5* <5*  CREATININE 0.91  < > 0.45 0.48 0.35* 0.47 0.47  CALCIUM 9.5  < > 8.5* 8.4* 8.5* 8.6* 8.8*  MG 1.8  --   --   --  1.5*  --   --   < > = values in this interval not displayed.   CBC:  Recent Labs Lab 11/06/15 2150  WBC 15.8*  NEUTROABS 13.0*  HGB 12.8  HCT 38.9  MCV 89.4  PLT 380    CBG:  Recent Labs Lab 11/09/15 0718 11/09/15 1111 11/09/15 1644 11/09/15 2108 11/10/15 0802  GLUCAP 172* 108* 312* 143* 101*    Signed:  Esme Freund MD. Rosalita Chessman.  [facility] 11/10/2015, 9:57 AM

## 2015-11-10 NOTE — Progress Notes (Signed)
Pt IVs removed, tolerated well.  Reviewed discharge instructions with pt and answered questions at this time.

## 2015-11-23 ENCOUNTER — Other Ambulatory Visit: Payer: Self-pay | Admitting: Family Medicine

## 2015-12-06 ENCOUNTER — Other Ambulatory Visit: Payer: Self-pay | Admitting: "Endocrinology

## 2015-12-06 LAB — BASIC METABOLIC PANEL
BUN: 10 mg/dL (ref 7–25)
CHLORIDE: 102 mmol/L (ref 98–110)
CO2: 21 mmol/L (ref 20–31)
Calcium: 9.3 mg/dL (ref 8.6–10.2)
Creat: 0.54 mg/dL (ref 0.50–1.10)
GLUCOSE: 261 mg/dL — AB (ref 65–99)
POTASSIUM: 3.9 mmol/L (ref 3.5–5.3)
SODIUM: 135 mmol/L (ref 135–146)

## 2015-12-06 LAB — HEMOGLOBIN A1C
Hgb A1c MFr Bld: 10.4 % — ABNORMAL HIGH (ref <5.7)
Mean Plasma Glucose: 252 mg/dL

## 2015-12-13 ENCOUNTER — Encounter: Payer: Self-pay | Admitting: "Endocrinology

## 2015-12-13 ENCOUNTER — Ambulatory Visit (INDEPENDENT_AMBULATORY_CARE_PROVIDER_SITE_OTHER): Payer: BC Managed Care – PPO | Admitting: "Endocrinology

## 2015-12-13 VITALS — BP 110/78 | HR 90 | Ht 62.0 in | Wt 116.0 lb

## 2015-12-13 DIAGNOSIS — K8681 Exocrine pancreatic insufficiency: Secondary | ICD-10-CM

## 2015-12-13 DIAGNOSIS — IMO0002 Reserved for concepts with insufficient information to code with codable children: Secondary | ICD-10-CM

## 2015-12-13 DIAGNOSIS — E0865 Diabetes mellitus due to underlying condition with hyperglycemia: Secondary | ICD-10-CM | POA: Diagnosis not present

## 2015-12-13 DIAGNOSIS — K8689 Other specified diseases of pancreas: Secondary | ICD-10-CM | POA: Diagnosis not present

## 2015-12-13 NOTE — Progress Notes (Signed)
Subjective:    Patient ID: Linda Walls, female    DOB: 1991/01/11, PCP [name], MD   Past Medical History  Diagnosis Date  . Hereditary pancreatitis   . DM type 1 (diabetes mellitus, type 1) ([place])   . Hernia   . GERD (gastroesophageal reflux disease)   . Jaundice   . DKA (diabetic ketoacidoses) [facility])    Past Surgical History  Procedure Laterality Date  . Hernia repair      As infant.  . Bile duct stent placement  2006 at [facility]  . Pancreatic cyst drainage  at age 55 year   Social History   Social History  . Marital Status: Single    Spouse Name: N/A  . Number of Children: N/A  . Years of Education: N/A   Social History Main Topics  . Smoking status: Never Smoker   . Smokeless tobacco: Never Used  . Alcohol Use: No  . Drug Use: No  . Sexual Activity: Yes    Birth Control/ Protection: None   Other Topics Concern  . None   Social History Narrative   Outpatient Encounter Prescriptions as of 12/13/2015  Medication Sig  . CREON 24000 units CPEP Take 1 capsule by mouth 3 (three) times daily with meals.  . Cyanocobalamin (B-12 PO) Take 1 tablet by mouth daily.  . ferrous sulfate 325 (65 FE) MG tablet Take 325 mg by mouth daily with breakfast.  . GLUCAGON EMERGENCY 1 MG injection Inject 1 mg as directed once.  . hyoscyamine (LEVSIN SL) 0.125 MG SL tablet PLACE 1 TABLET UNDER THE TONGUE EVERY 4 HOURS AS NEEDED FOR PAIN.  Marland Kitchen insulin aspart (NOVOLOG) 100 UNIT/ML FlexPen Inject 12-18 Units into the skin 3 (three) times daily with meals.  . Insulin Degludec (TRESIBA FLEXTOUCH) 100 UNIT/ML SOPN Inject 30 Units into the skin at bedtime. (Patient taking differently: Inject 30 Units into the skin daily. )  . magnesium oxide (MAG-OX) 400 (241.3 Mg) MG tablet Take 1 tablet (400 mg total) by mouth daily.  . Multiple Vitamins-Minerals (MULTIVITAMINS THER. W/MINERALS) TABS Take 1 tablet by mouth daily. For energy  . omeprazole (PRILOSEC) 20 MG capsule Take 1 capsule (20  mg total) by mouth daily. (Patient taking differently: Take 20 mg by mouth daily as needed (Acid Reflux). )  . oxyCODONE-acetaminophen (PERCOCET) 7.5-325 MG tablet Take one tablet up to BID prn pain (Patient taking differently: Take 1 tablet by mouth 2 (two) times daily as needed for moderate pain or severe pain. )  . potassium chloride SA (K-DUR,KLOR-CON) 20 MEQ tablet Take 1 tablet (20 mEq total) by mouth daily.  . SUMAtriptan (IMITREX) 50 MG tablet TAKE 1 TABLET BY MOUTH ONCE. MAY REPEAT IN 2 HOURS IF HEADACHE PERSISTS OR RECURS.  . vitamin C (ASCORBIC ACID) 500 MG tablet Take 500 mg by mouth daily.   No facility-administered encounter medications on file as of 12/13/2015.   ALLERGIES: Allergies  Allergen Reactions  . Shellfish Allergy Nausea And Vomiting  . Cefzil [Cefprozil] Rash  . Penicillins Rash    Has patient had a PCN reaction causing immediate rash, facial/tongue/throat swelling, SOB or lightheadedness with hypotension: NO Has patient had a PCN reaction causing severe rash involving mucus membranes or skin necrosis: NO Has patient had a PCN reaction that required hospitalization: NO Has patient had a PCN reaction occurring within the last 10 years: NO If all of the above answers are "NO", then may proceed with Cephalosporin use.   . Sulfa  Antibiotics Rash   VACCINATION STATUS: Immunization History  Administered Date(s) Administered  . DTaP 06/19/1993, 08/20/1993, 10/16/1993, 04/28/1994, 12/04/1998  . Hepatitis B 06/19/1993, 10/16/1993  . IPV 06/19/1993, 08/20/1993, 10/16/1993, 12/04/1998  . Influenza Split 05/19/2011, 05/01/2012  . Influenza,inj,Quad PF,36+ Mos 03/08/2013, 02/23/2015  . MMR 04/28/1994, 12/04/1998  . Pneumococcal Polysaccharide-23 05/19/2011, 10/16/2014  . 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  She came with Significantly better blood glucose profile. . -No interval hospitalizations. - Her A1c  has Increased to 10.4% from 9.2% . - He did have an episode of diabetes ketoacidosis since her last visit. -This was reportedly triggered by an attack of gastroenteritis. -She is no longer using DEXCOM continues glucose monitoring device due to insurance coverage interruptions.  She is on Toujeo 30 units and Novolog 12 units TIDAC ( she was supposed to use NovoLog 15 units 3 times a day before meals, overlooked instructions). she also remains on Creon 24000 ( 1 caps TIDAC , 1 caps per snacks).  she has regular menstrual cycles. She has lost 7 pounds since last visit . He feels much better  since her discharge. -She is up-to-date on ophthalmology and dental care. -She is status [facility].  -Review of Systems  Constitutional: Positive for fatigue. Negative for unexpected weight change.  HENT: Negative for trouble swallowing and voice change.   Eyes: Negative for visual disturbance.  Respiratory: Negative for cough, shortness of breath and wheezing.   Cardiovascular: Negative for chest pain, palpitations and leg swelling.  Gastrointestinal: Negative for nausea, vomiting and diarrhea.  Endocrine: Positive for polydipsia and polyuria. Negative for cold intolerance, heat intolerance and polyphagia.  Musculoskeletal: Negative for myalgias and arthralgias.  Skin: Negative for color change, pallor, rash and wound.  Neurological: Negative for seizures and headaches.  Psychiatric/Behavioral: Negative for suicidal ideas and confusion.    Objective:    BP 110/78 mmHg  Pulse 90  Ht '5\' 2"'  (1.575 m)  Wt 116 lb (52.617 kg)  BMI 21.21 kg/m2  SpO2 96%  Wt Readings from Last 3 Encounters:  12/13/15 116 lb (52.617 kg)  11/08/15 123 lb 7.3 oz (56 kg)  09/25/15 130 lb 2 oz (59.024 kg)    Physical Exam  Constitutional: She is oriented to person, place, and time. She appears well-developed.  HENT:  Head: Normocephalic and atraumatic.  Eyes: EOM are normal.   Neck: Normal range of motion. Neck supple. No tracheal deviation present. No thyromegaly present.  Cardiovascular: Normal rate and regular rhythm.   Pulmonary/Chest: Effort normal and breath sounds normal.  Abdominal: Soft. Bowel sounds are normal. There is no tenderness. There is no guarding.  Musculoskeletal: Normal range of motion. She exhibits no edema.  Neurological: She is alert and oriented to person, place, and time. She has normal reflexes. No cranial nerve deficit. Coordination normal.  Skin: Skin is warm and dry. No rash noted. No erythema. No pallor.  Psychiatric: She has a normal mood and affect. Judgment normal.    Complete Blood Count (Most recent): Lab Results  Component Value Date   WBC 15.8* 11/06/2015   HGB 12.8 11/06/2015   HCT 38.9 11/06/2015   MCV 89.4 11/06/2015   PLT 380 11/06/2015   Chemistry (most recent): Lab Results  Component Value Date   NA 135 12/06/2015   K 3.9 12/06/2015   CL 102 12/06/2015   CO2 21 12/06/2015   BUN 10  12/06/2015   CREATININE 0.54 12/06/2015   Diabetic Labs (most recent): Lab Results  Component Value Date   HGBA1C 10.4* 12/06/2015   HGBA1C 9.2* 08/28/2015   HGBA1C 11.2* 05/05/2015    1) uncontrolled pancreatic diabetes 2) pancreatic insufficiency 3) vitamin D deficiency   Assessment & Plan:   1. Uncontrolled type 1 diabetes mellitus with complication [facility])  She Did have an episode of diabetic acidosis since last visit, is no longer on DEXCOM due to coverage issues .  -She remains at a high risk for more acute and chronic complications of diabetes which include CAD, CVA, CKD, retinopathy, and neuropathy. These are all discussed in detail with the patient. -Her  recent A1c is   higher at 10.4% from 9.2%. - Glucose logs and insulin administration records pertaining to this visit,  to be scanned into patient's records.  Recent labs reviewed.  - Suggestion is made for patient to avoid simple carbohydrates   from their  diet including Cakes , Desserts, Ice Cream,  Soda (  diet and regular) , Sweet Tea , Candies,  Chips, Cookies, Artificial Sweeteners,   and "Sugar-free" Products .  This will help patient to have stable blood glucose profile and potentially avoid unintended  Weight gain.  - Patient is advised to stick to a routine mealtimes to eat 3 meals  a day and avoid unnecessary snacks ( to snack only to correct hypoglycemia).  - I have approached patient with the following individualized plan to manage diabetes and patient agrees.   - I will continue Tresiba 30 units qhs, and increase  Novolog to 15 units TIDAC for premeal BG readings of 90-1109m/dl, plus patient specific sliding scale insulin for correction of unexpected hyperglycemia above 1553mdl, associated with strict monitoring of BG AC and HS.  She will  need coverage for BG 70-90, with 6 units of NovoLog.  I urged her to allow more  carbs , specially complex carbs, to prevent un-planned weight loss.  -Adjustment parameters for hypo and hyperglycemia were given in a written document to patient. -Patient is encouraged to call clinic for blood glucose levels less than 70 or above 300 mg /dl.C plus SSI.  Her diabetes is due to yet unidentified hereditary pancreatitis. She is administratively classified as type 1 diabetes,  will be treated as a typical type 1 DM. -[facility] to follow up on the process of coverage.  -She is not a candidate for metformin,SGLT2 inhibitors, and incretin therapy. -Recent thyroid function test where normal , her prior AM cortisol is normal at 18. - Patient specific target  for A1c; LDL, HDL, Triglycerides, and  Waist Circumference were discussed in detail. -Her ophthalm eval was negative, and due for another one.  2) Pancreatic Insufficiency: -she is advised to continue creon 24,000 units caps per meal and 1 tab per snack.  3) vitamin D deficiency: She is status post therapy with vitamin  D 50,000 units weekly.  She is a status post treatment with Vitamin D 50K units weekly x 12 weeks.  4) Chronic Care/Health Maintenance:  -Patient  Is encouraged to continue to follow up with Ophthalmology, Podiatrist at least yearly or according to recommendations, and advised to stay away from smoking. I have recommended yearly flu vaccine and pneumonia vaccination at least every 5 years; moderate intensity exercise for up to 150 minutes weekly; and  sleep for at least 7 hours a day.  - 25 minutes of time was spent on  the care of this patient , 50% of which was applied for counseling on diabetes complications and their preventions.  - I advised patient to maintain close follow up with Dr. [name]  for primary care needs.   Patient is asked to bring meter and  blood glucose logs during their next visit.   Follow up plan: -Return in about 2 weeks (around 12/27/2015) for diabetes, follow up with meter and logs- no labs.  Glade Lloyd, MD Phone: [phone]  Fax: [phone]   12/13/2015, 11:32 AM

## 2015-12-21 ENCOUNTER — Other Ambulatory Visit: Payer: Self-pay | Admitting: Family Medicine

## 2015-12-27 ENCOUNTER — Ambulatory Visit (INDEPENDENT_AMBULATORY_CARE_PROVIDER_SITE_OTHER): Payer: BC Managed Care – PPO | Admitting: "Endocrinology

## 2015-12-27 ENCOUNTER — Encounter: Payer: Self-pay | Admitting: "Endocrinology

## 2015-12-27 VITALS — BP 99/63 | HR 88 | Ht 62.0 in | Wt 119.0 lb

## 2015-12-27 DIAGNOSIS — K8689 Other specified diseases of pancreas: Secondary | ICD-10-CM | POA: Diagnosis not present

## 2015-12-27 DIAGNOSIS — E1010 Type 1 diabetes mellitus with ketoacidosis without coma: Secondary | ICD-10-CM

## 2015-12-27 DIAGNOSIS — IMO0002 Reserved for concepts with insufficient information to code with codable children: Secondary | ICD-10-CM

## 2015-12-27 DIAGNOSIS — E0865 Diabetes mellitus due to underlying condition with hyperglycemia: Secondary | ICD-10-CM | POA: Diagnosis not present

## 2015-12-27 MED ORDER — ONETOUCH DELICA LANCETS 33G MISC: Status: DC

## 2015-12-27 MED ORDER — INSULIN PEN NEEDLE 32G X 4 MM MISC: 1.0000 | Freq: Four times a day (QID) | Status: DC

## 2015-12-27 NOTE — Progress Notes (Signed)
Subjective:    Patient ID: Linda Walls, female    DOB: Jul 24, 1990, PCP [name], MD   Past Medical History  Diagnosis Date  . Hereditary pancreatitis   . DM type 1 (diabetes mellitus, type 1) ([place])   . Hernia   . GERD (gastroesophageal reflux disease)   . Jaundice   . DKA (diabetic ketoacidoses) [facility])    Past Surgical History  Procedure Laterality Date  . Hernia repair      As infant.  . Bile duct stent placement  2006 at [facility]  . Pancreatic cyst drainage  at age 40 year   Social History   Social History  . Marital Status: Single    Spouse Name: N/A  . Number of Children: N/A  . Years of Education: N/A   Social History Main Topics  . Smoking status: Never Smoker   . Smokeless tobacco: Never Used  . Alcohol Use: No  . Drug Use: No  . Sexual Activity: Yes    Birth Control/ Protection: None   Other Topics Concern  . None   Social History Narrative   Outpatient Encounter Prescriptions as of 12/27/2015  Medication Sig  . Insulin Degludec (TRESIBA FLEXTOUCH [place] ) Inject 24 Units into the skin at bedtime.  Marland Kitchen CREON 24000 units CPEP Take 1 capsule by mouth 3 (three) times daily with meals.  . Cyanocobalamin (B-12 PO) Take 1 tablet by mouth daily.  . ferrous sulfate 325 (65 FE) MG tablet Take 325 mg by mouth daily with breakfast.  . GLUCAGON EMERGENCY 1 MG injection Inject 1 mg as directed once.  . hyoscyamine (LEVSIN SL) 0.125 MG SL tablet PLACE 1 TABLET UNDER THE TONGUE EVERY 4 HOURS AS NEEDED FOR PAIN.  Marland Kitchen insulin aspart (NOVOLOG) 100 UNIT/ML FlexPen Inject 12-18 Units into the skin 3 (three) times daily with meals. (Patient taking differently: Inject 15-21 Units into the skin 3 (three) times daily with meals. )  . Insulin Pen Needle (BD PEN NEEDLE NANO U/F) 32G X 4 MM MISC 1 each by Does not apply route 4 (four) times daily.  . magnesium oxide (MAG-OX) 400 (241.3 Mg) MG tablet Take 1 tablet (400 mg total) by mouth daily.  . Multiple Vitamins-Minerals  (MULTIVITAMINS THER. W/MINERALS) TABS Take 1 tablet by mouth daily. For energy  . omeprazole (PRILOSEC) 20 MG capsule Take 1 capsule (20 mg total) by mouth daily. (Patient taking differently: Take 20 mg by mouth daily as needed (Acid Reflux). )  . ONETOUCH DELICA LANCETS 14E MISC Use to test glucose up to 6 times a day  . potassium chloride SA (K-DUR,KLOR-CON) 20 MEQ tablet Take 1 tablet (20 mEq total) by mouth daily.  . SUMAtriptan (IMITREX) 50 MG tablet TAKE 1 TABLET BY MOUTH ONCE. MAY REPEAT IN 2 HOURS IF HEADACHE PERSISTS OR RECURS.  . vitamin C (ASCORBIC ACID) 500 MG tablet Take 500 mg by mouth daily.  . [DISCONTINUED] Insulin Degludec (TRESIBA FLEXTOUCH) 100 UNIT/ML SOPN Inject 30 Units into the skin at bedtime. (Patient taking differently: Inject 30 Units into the skin daily. )  . [DISCONTINUED] oxyCODONE-acetaminophen (PERCOCET) 7.5-325 MG tablet Take one tablet up to BID prn pain (Patient taking differently: Take 1 tablet by mouth 2 (two) times daily as needed for moderate pain or severe pain. )   No facility-administered encounter medications on file as of 12/27/2015.   ALLERGIES: Allergies  Allergen Reactions  . Shellfish Allergy Nausea And Vomiting  . Cefzil [Cefprozil] Rash  . Penicillins Rash  Has patient had a PCN reaction causing immediate rash, facial/tongue/throat swelling, SOB or lightheadedness with hypotension: NO Has patient had a PCN reaction causing severe rash involving mucus membranes or skin necrosis: NO Has patient had a PCN reaction that required hospitalization: NO Has patient had a PCN reaction occurring within the last 10 years: NO If all of the above answers are "NO", then may proceed with Cephalosporin use.   . Sulfa Antibiotics Rash   VACCINATION STATUS: Immunization History  Administered Date(s) Administered  . DTaP 06/19/1993, 08/20/1993, 10/16/1993, 04/28/1994, 12/04/1998  . Hepatitis B 06/19/1993, 10/16/1993  . IPV 06/19/1993, 08/20/1993,  10/16/1993, 12/04/1998  . Influenza Split 05/19/2011, 05/01/2012  . Influenza,inj,Quad PF,36+ Mos 03/08/2013, 02/23/2015  . MMR 04/28/1994, 12/04/1998  . Pneumococcal Polysaccharide-23 05/19/2011, 10/16/2014  . 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  She came with Significantly better blood glucose profile. -No interval hospitalizations. - Her  Recent A1c has Increased to 10.4% from 9.2% . - He did have an episode of diabetes ketoacidosis since her last visit. -This was reportedly triggered by an attack of gastroenteritis. -She is no longer using DEXCOM continues glucose monitoring device due to insurance coverage interruptions.  She is on Toujeo 30 units and Novolog 15 units TIDAC. She has some fasting hypoglycemia since last visit. However she is running significantly above target postprandial. she also remains on Creon 24000 ( 1 caps TIDAC , 1 caps per snacks).  she has regular menstrual cycles. Marland Kitchen He feels much better  since her discharge. -She is up-to-date on ophthalmology and dental care. -She is status [facility].  -Review of Systems  Constitutional: Positive for fatigue. Negative for unexpected weight change.  HENT: Negative for trouble swallowing and voice change.   Eyes: Negative for visual disturbance.  Respiratory: Negative for cough, shortness of breath and wheezing.   Cardiovascular: Negative for chest pain, palpitations and leg swelling.  Gastrointestinal: Negative for nausea, vomiting and diarrhea.  Endocrine: Positive for polydipsia and polyuria. Negative for cold intolerance, heat intolerance and polyphagia.  Musculoskeletal: Negative for myalgias and arthralgias.  Skin: Negative for color change, pallor, rash and wound.  Neurological: Negative for seizures and headaches.  Psychiatric/Behavioral: Negative for suicidal ideas and confusion.    Objective:    BP 99/63  mmHg  Pulse 88  Ht _0  (1.575 m)  Wt 119 lb (53.978 kg)  BMI 21.76 kg/m2  Wt Readings from Last 3 Encounters:  12/27/15 119 lb (53.978 kg)  12/13/15 116 lb (52.617 kg)  11/08/15 123 lb 7.3 oz (56 kg)    Physical Exam  Constitutional: She is oriented to person, place, and time. She appears well-developed.  HENT:  Head: Normocephalic and atraumatic.  Eyes: EOM are normal.  Neck: Normal range of motion. Neck supple. No tracheal deviation present. No thyromegaly present.  Cardiovascular: Normal rate and regular rhythm.   Pulmonary/Chest: Effort normal and breath sounds normal.  Abdominal: Soft. Bowel sounds are normal. There is no tenderness. There is no guarding.  Musculoskeletal: Normal range of motion. She exhibits no edema.  Neurological: She is alert and oriented to person, place, and time. She has normal reflexes. No cranial nerve deficit. Coordination normal.  Skin: Skin is warm and dry. No rash noted. No erythema. No pallor.  Psychiatric: She has a normal mood and affect. Judgment normal.    Complete Blood Count (Most recent): Lab Results  Component Value  Date   WBC 15.8* 11/06/2015   HGB 12.8 11/06/2015   HCT 38.9 11/06/2015   MCV 89.4 11/06/2015   PLT 380 11/06/2015   Chemistry (most recent): Lab Results  Component Value Date   NA 135 12/06/2015   K 3.9 12/06/2015   CL 102 12/06/2015   CO2 21 12/06/2015   BUN 10 12/06/2015   CREATININE 0.54 12/06/2015   Diabetic Labs (most recent): Lab Results  Component Value Date   HGBA1C 10.4* 12/06/2015   HGBA1C 9.2* 08/28/2015   HGBA1C 11.2* 05/05/2015    1) uncontrolled pancreatic diabetes 2) pancreatic insufficiency 3) vitamin D deficiency   Assessment & Plan:   1. Uncontrolled type 1 diabetes mellitus with complication [facility])  She Did have an episode of diabetic acidosis since last visit, is no longer on DEXCOM due to coverage issues .  -She remains at a high risk for more acute and chronic complications  of diabetes which include CAD, CVA, CKD, retinopathy, and neuropathy. These are all discussed in detail with the patient. -Her  recent A1c is   higher at 10.4% from 9.2%. - Glucose logs and insulin administration records pertaining to this visit,  to be scanned into patient's records.  Recent labs reviewed.  - Suggestion is made for patient to avoid simple carbohydrates   from their diet including Cakes , Desserts, Ice Cream,  Soda (  diet and regular) , Sweet Tea , Candies,  Chips, Cookies, Artificial Sweeteners,   and "Sugar-free" Products .  This will help patient to have stable blood glucose profile and potentially avoid unintended  Weight gain.  - Patient is advised to stick to a routine mealtimes to eat 3 meals  a day and avoid unnecessary snacks ( to snack only to correct hypoglycemia).  - I have approached patient with the following individualized plan to manage diabetes and patient agrees.   -  She came with some fasting hypoglycemia below 7o, 3-4 times a week, I will continue decreased Olivia Mackie about 24 units qhs, and continue  Novolog  15 units TIDAC for premeal BG readings of 90-142m/dl, plus patient specific sliding scale insulin for correction of unexpected hyperglycemia above 1580mdl, associated with strict monitoring of BG AC and HS.  She will  need coverage for BG 70-90, with 8 units of NovoLog.  I urged her to allow more  carbs , specially complex carbs, to prevent un-planned weight loss. -She is a vegetarian, gave her more plant-based protein sources to diversify her protein intake.   -Adjustment parameters for hypo and hyperglycemia were given in a written document to patient. -Patient is encouraged to call clinic for blood glucose levels less than 70 or above 300 mg /dl.C plus SSI.  Her diabetes is due to yet unidentified hereditary pancreatitis. She is administratively classified as type 1 diabetes,  will be treated as a typical type 1 DM. -She is using DEXCOM. -She is not a  candidate for metformin,SGLT2 inhibitors, and incretin therapy. -Recent thyroid function test where normal , her prior AM cortisol is normal at 18. - Patient specific target  for A1c; LDL, HDL, Triglycerides, and  Waist Circumference were discussed in detail. -Her ophthalm eval was negative, and due for another one.  2) Pancreatic Insufficiency: -she is advised to continue creon 24,000 units caps per meal and 1 tab per snack. - She has gained 3 pounds since last visit, a good development for her. 3) vitamin D deficiency: She is status post therapy with vitamin D 50,000  units weekly.  She is a status post treatment with Vitamin D 50K units weekly x 12 weeks.  4) Chronic Care/Health Maintenance:  -Patient  Is encouraged to continue to follow up with Ophthalmology, Podiatrist at least yearly or according to recommendations, and advised to stay away from smoking. I have recommended yearly flu vaccine and pneumonia vaccination at least every 5 years; moderate intensity exercise for up to 150 minutes weekly; and  sleep for at least 7 hours a day. - I will obtain vitamin B12 and vitamin D before her next visit.  - 25 minutes of time was spent on the care of this patient , 50% of which was applied for counseling on diabetes complications and their preventions.  - I advised patient to maintain close follow up with Dr. [name]  for primary care needs.   Patient is asked to bring meter and  blood glucose logs during their next visit.   Follow up plan: -Return in about 3 months (around 03/28/2016) for follow up with pre-visit labs, meter, and logs.  Glade Lloyd, MD Phone: [phone]  Fax: [phone]   12/27/2015, 8:50 AM

## 2016-01-01 ENCOUNTER — Inpatient Hospital Stay (HOSPITAL_COMMUNITY)
Admission: EM | Admit: 2016-01-01 | Discharge: 2016-01-04 | DRG: 440 | Disposition: A | Discharge status: Home / Self Care | Payer: BC Managed Care – PPO | Attending: Family Medicine | Admitting: Family Medicine

## 2016-01-01 ENCOUNTER — Encounter (HOSPITAL_COMMUNITY): Payer: Self-pay | Admitting: Emergency Medicine

## 2016-01-01 DIAGNOSIS — R112 Nausea with vomiting, unspecified: Secondary | ICD-10-CM | POA: Diagnosis present

## 2016-01-01 DIAGNOSIS — K861 Other chronic pancreatitis: Secondary | ICD-10-CM

## 2016-01-01 DIAGNOSIS — E1065 Type 1 diabetes mellitus with hyperglycemia: Secondary | ICD-10-CM | POA: Diagnosis present

## 2016-01-01 DIAGNOSIS — E0865 Diabetes mellitus due to underlying condition with hyperglycemia: Secondary | ICD-10-CM | POA: Diagnosis not present

## 2016-01-01 DIAGNOSIS — K8689 Other specified diseases of pancreas: Secondary | ICD-10-CM | POA: Diagnosis not present

## 2016-01-01 DIAGNOSIS — Z794 Long term (current) use of insulin: Secondary | ICD-10-CM

## 2016-01-01 DIAGNOSIS — Z803 Family history of malignant neoplasm of breast: Secondary | ICD-10-CM

## 2016-01-01 DIAGNOSIS — K8681 Exocrine pancreatic insufficiency: Secondary | ICD-10-CM | POA: Diagnosis present

## 2016-01-01 DIAGNOSIS — Z8249 Family history of ischemic heart disease and other diseases of the circulatory system: Secondary | ICD-10-CM

## 2016-01-01 DIAGNOSIS — K859 Acute pancreatitis without necrosis or infection, unspecified: Secondary | ICD-10-CM | POA: Diagnosis not present

## 2016-01-01 DIAGNOSIS — Z882 Allergy status to sulfonamides status: Secondary | ICD-10-CM

## 2016-01-01 DIAGNOSIS — G8929 Other chronic pain: Secondary | ICD-10-CM | POA: Diagnosis present

## 2016-01-01 DIAGNOSIS — K219 Gastro-esophageal reflux disease without esophagitis: Secondary | ICD-10-CM | POA: Diagnosis present

## 2016-01-01 DIAGNOSIS — IMO0002 Reserved for concepts with insufficient information to code with codable children: Secondary | ICD-10-CM | POA: Diagnosis present

## 2016-01-01 DIAGNOSIS — R1110 Vomiting, unspecified: Secondary | ICD-10-CM | POA: Diagnosis not present

## 2016-01-01 DIAGNOSIS — Z91013 Allergy to seafood: Secondary | ICD-10-CM

## 2016-01-01 DIAGNOSIS — Z820 Family history of epilepsy and other diseases of the nervous system: Secondary | ICD-10-CM

## 2016-01-01 DIAGNOSIS — Z880 Allergy status to penicillin: Secondary | ICD-10-CM

## 2016-01-01 LAB — COMPREHENSIVE METABOLIC PANEL
ALK PHOS: 72 U/L (ref 38–126)
ALT: 16 U/L (ref 14–54)
AST: 20 U/L (ref 15–41)
Albumin: 4.6 g/dL (ref 3.5–5.0)
Anion gap: 10 (ref 5–15)
BILIRUBIN TOTAL: 0.5 mg/dL (ref 0.3–1.2)
BUN: 10 mg/dL (ref 6–20)
CALCIUM: 9.8 mg/dL (ref 8.9–10.3)
CO2: 20 mmol/L — ABNORMAL LOW (ref 22–32)
CREATININE: 0.41 mg/dL — AB (ref 0.44–1.00)
Chloride: 108 mmol/L (ref 101–111)
GFR calc Af Amer: >60 mL/min (ref >60)
GLUCOSE: 114 mg/dL — AB (ref 65–99)
POTASSIUM: 3.4 mmol/L — AB (ref 3.5–5.1)
Sodium: 138 mmol/L (ref 135–145)
TOTAL PROTEIN: 7.9 g/dL (ref 6.5–8.1)

## 2016-01-01 LAB — GLUCOSE, CAPILLARY
GLUCOSE-CAPILLARY: 75 mg/dL (ref 65–99)
Glucose-Capillary: 75 mg/dL (ref 65–99)
Glucose-Capillary: 179 mg/dL — ABNORMAL HIGH (ref 65–99)
Glucose-Capillary: 184 mg/dL — ABNORMAL HIGH (ref 65–99)

## 2016-01-01 LAB — CBC WITH DIFFERENTIAL/PLATELET
BASOS ABS: 0 10*3/uL (ref 0.0–0.1)
BASOS PCT: 0 %
EOS ABS: 0.1 10*3/uL (ref 0.0–0.7)
EOS PCT: 1 %
HCT: 39.7 % (ref 36.0–46.0)
Hemoglobin: 13.7 g/dL (ref 12.0–15.0)
Lymphocytes Relative: 16 %
Lymphs Abs: 1.5 10*3/uL (ref 0.7–4.0)
MCH: 30 pg (ref 26.0–34.0)
MCHC: 34.5 g/dL (ref 30.0–36.0)
MCV: 87.1 fL (ref 78.0–100.0)
MONO ABS: 0.4 10*3/uL (ref 0.1–1.0)
Monocytes Relative: 5 %
Neutro Abs: 7.6 10*3/uL (ref 1.7–7.7)
Neutrophils Relative %: 78 %
PLATELETS: 410 10*3/uL — AB (ref 150–400)
RBC: 4.56 MIL/uL (ref 3.87–5.11)
RDW: 12.4 % (ref 11.5–15.5)
WBC: 9.7 10*3/uL (ref 4.0–10.5)

## 2016-01-01 LAB — I-STAT BETA HCG BLOOD, ED (MC, WL, AP ONLY)

## 2016-01-01 LAB — LIPASE, BLOOD: Lipase: 13 U/L (ref 11–51)

## 2016-01-01 LAB — CBG MONITORING, ED: GLUCOSE-CAPILLARY: 103 mg/dL — AB (ref 65–99)

## 2016-01-01 MED ORDER — ONDANSETRON HCL 4 MG/2ML IJ SOLN
4.0000 mg | Freq: Once | INTRAMUSCULAR | Status: AC
  Administered 2016-01-01: 4 mg via INTRAVENOUS
  Filled 2016-01-01: qty 2

## 2016-01-01 MED ORDER — ONDANSETRON HCL 4 MG/2ML IJ SOLN
4.0000 mg | Freq: Four times a day (QID) | INTRAMUSCULAR | Status: DC | PRN
  Administered 2016-01-01 – 2016-01-04 (×8): 4 mg via INTRAVENOUS
  Filled 2016-01-01 (×8): qty 2

## 2016-01-01 MED ORDER — ENOXAPARIN SODIUM 40 MG/0.4ML ~~LOC~~ SOLN
40.0000 mg | SUBCUTANEOUS | Status: DC
  Administered 2016-01-01 – 2016-01-04 (×4): 40 mg via SUBCUTANEOUS
  Filled 2016-01-01 (×4): qty 0.4

## 2016-01-01 MED ORDER — HYDROMORPHONE HCL 1 MG/ML IJ SOLN
1.0000 mg | Freq: Once | INTRAMUSCULAR | Status: AC
  Administered 2016-01-01: 1 mg via INTRAVENOUS
  Filled 2016-01-01: qty 1

## 2016-01-01 MED ORDER — SODIUM CHLORIDE 0.9 % IV BOLUS (SEPSIS)
1000.0000 mL | Freq: Once | INTRAVENOUS | Status: AC
  Administered 2016-01-01: 1000 mL via INTRAVENOUS

## 2016-01-01 MED ORDER — ONDANSETRON HCL 4 MG/2ML IJ SOLN: 4.0000 mg | Freq: Three times a day (TID) | INTRAMUSCULAR | Status: AC | PRN

## 2016-01-01 MED ORDER — INSULIN GLARGINE 100 UNIT/ML ~~LOC~~ SOLN
24.0000 [IU] | Freq: Every day | SUBCUTANEOUS | Status: DC
  Administered 2016-01-01: 24 [IU] via SUBCUTANEOUS
  Filled 2016-01-01 (×2): qty 0.24

## 2016-01-01 MED ORDER — ACETAMINOPHEN 325 MG PO TABS: 650.0000 mg | ORAL_TABLET | Freq: Four times a day (QID) | ORAL | Status: DC | PRN

## 2016-01-01 MED ORDER — POTASSIUM CHLORIDE CRYS ER 20 MEQ PO TBCR
40.0000 meq | EXTENDED_RELEASE_TABLET | Freq: Once | ORAL | Status: AC
  Administered 2016-01-01: 40 meq via ORAL
  Filled 2016-01-01: qty 2

## 2016-01-01 MED ORDER — ACETAMINOPHEN 650 MG RE SUPP
650.0000 mg | Freq: Four times a day (QID) | RECTAL | Status: DC | PRN
Start: 2016-01-01 — End: 2016-01-04

## 2016-01-01 MED ORDER — HYDROMORPHONE HCL 1 MG/ML IJ SOLN
1.0000 mg | INTRAMUSCULAR | Status: AC | PRN
  Administered 2016-01-01 (×3): 1 mg via INTRAVENOUS
  Filled 2016-01-01 (×3): qty 1

## 2016-01-01 MED ORDER — INSULIN DEGLUDEC 100 UNIT/ML ~~LOC~~ SOPN: 24.0000 [IU] | PEN_INJECTOR | Freq: Every day | SUBCUTANEOUS | Status: DC

## 2016-01-01 MED ORDER — ONDANSETRON HCL 4 MG PO TABS
4.0000 mg | ORAL_TABLET | Freq: Four times a day (QID) | ORAL | Status: DC | PRN
  Administered 2016-01-03: 4 mg via ORAL
  Filled 2016-01-01: qty 1

## 2016-01-01 MED ORDER — INSULIN ASPART 100 UNIT/ML ~~LOC~~ SOLN
0.0000 [IU] | Freq: Three times a day (TID) | SUBCUTANEOUS | Status: DC
  Administered 2016-01-01: 2 [IU] via SUBCUTANEOUS
  Administered 2016-01-02 – 2016-01-03 (×2): 1 [IU] via SUBCUTANEOUS

## 2016-01-01 MED ORDER — HYDROMORPHONE HCL 1 MG/ML IJ SOLN
1.0000 mg | INTRAMUSCULAR | Status: DC | PRN
  Administered 2016-01-01 – 2016-01-04 (×12): 1 mg via INTRAVENOUS
  Filled 2016-01-01 (×12): qty 1

## 2016-01-01 MED ORDER — SODIUM CHLORIDE 0.9 % IV SOLN
INTRAVENOUS | Status: DC
  Administered 2016-01-01: 14:00:00 via INTRAVENOUS

## 2016-01-01 MED ORDER — SODIUM CHLORIDE 0.9 % IV SOLN
Freq: Once | INTRAVENOUS | Status: AC
  Administered 2016-01-01: 04:00:00 via INTRAVENOUS

## 2016-01-01 MED ORDER — PANCRELIPASE (LIP-PROT-AMYL) 12000-38000 UNITS PO CPEP
24000.0000 [IU] | ORAL_CAPSULE | Freq: Three times a day (TID) | ORAL | Status: DC
  Administered 2016-01-01 – 2016-01-04 (×10): 24000 [IU] via ORAL
  Filled 2016-01-01 (×10): qty 2

## 2016-01-01 NOTE — ED Notes (Signed)
Pt c/o vomiting and diarrhea x 3 days;  

## 2016-01-01 NOTE — ED Provider Notes (Signed)
CSN: [id number]     Arrival date & time 01/01/16  0018 History   First MD Initiated Contact with Patient 01/01/16 0036     Chief Complaint  Patient presents with  . Emesis     (Consider location/radiation/quality/duration/timing/severity/associated sxs/prior Treatment) HPI Comments: The patient is a 25 year old female who has a history of insulin-requiring diabetes as well as chronic pancreatitis which she has had from a very young age. At the time she was in fifth grade she had R he had abdominal surgery, pancreatic stent placement and then pancreatic stent removal. She does not drink alcohol and has never had a cholecystectomy. 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 She reports that over the last several days she has had a progressive and more frequently recurring episodes of nausea vomiting and epigastric pain. Over the course of the last 3 days it is gone from nausea on Saturday, one episode of vomiting with persistent nausea on Sunday and within the last 24 hours she has vomited approximately 12 times. The symptoms are progressive, nothing seems to make it better including rectal Phenergan and oral Percocet.  Patient is a 25 y.o. female presenting with vomiting. The history is provided by the patient and a relative.  Emesis   Past Medical History  Diagnosis Date  . Hereditary pancreatitis   . DM type 1 (diabetes mellitus, type 1) ([place])   . Hernia   . GERD (gastroesophageal reflux disease)   . Jaundice   . DKA (diabetic ketoacidoses) [facility])    Past Surgical History  Procedure Laterality Date  . Hernia repair      As infant.  . Bile duct stent placement  2006 at [facility]  . Pancreatic cyst drainage  at age 32 year   Family History  Problem Relation Age of Onset  . Pancreatitis Father 30    hereditary  . Hypertension Father   . GER disease Father   . Breast cancer Maternal Aunt   . Congenital heart disease Maternal  Grandfather   . Prostate cancer Maternal Grandfather   . Alzheimer's disease Maternal Grandfather   . Hypothyroidism Mother    Social History  Substance Use Topics  . Smoking status: Never Smoker   . Smokeless tobacco: Never Used  . Alcohol Use: No   OB History    No data available     Review of Systems  Gastrointestinal: Positive for vomiting.  All other systems reviewed and are negative.     Allergies  Shellfish allergy; Cefzil; Penicillins; and Sulfa antibiotics  Home Medications   Prior to Admission medications   Medication Sig Start Date End Date Taking? Authorizing Provider  CREON 24000 units CPEP Take 1 capsule by mouth 3 (three) times daily with meals. 10/30/15   Historical Provider, MD  Cyanocobalamin (B-12 PO) Take 1 tablet by mouth daily.    Historical Provider, MD  ferrous sulfate 325 (65 FE) MG tablet Take 325 mg by mouth daily with breakfast.    Historical Provider, MD  GLUCAGON EMERGENCY 1 MG injection Inject 1 mg as directed once. 03/13/15   Historical Provider, MD  hyoscyamine (LEVSIN SL) 0.125 MG SL tablet PLACE 1 TABLET UNDER THE TONGUE EVERY 4 HOURS AS NEEDED FOR PAIN. 09/17/15   [name], MD  insulin aspart (NOVOLOG) 100 UNIT/ML FlexPen Inject 12-18 Units into the skin 3 (three) times daily with meals. Patient taking differently: Inject 15-21 Units into the skin 3 (three) times daily with meals.  09/06/15   [name], MD  Insulin Degludec (TRESIBA FLEXTOUCH [place]) Inject 24 Units into the skin at bedtime.    Historical Provider, MD  Insulin Pen Needle (BD PEN NEEDLE NANO U/F) 32G X 4 MM MISC 1 each by Does not apply route 4 (four) times daily. 12/27/15   [name], MD  magnesium oxide (MAG-OX) 400 (241.3 Mg) MG tablet Take 1 tablet (400 mg total) by mouth daily. 11/10/15   Orvan Falconer, MD  Multiple Vitamins-Minerals (MULTIVITAMINS THER. W/MINERALS) TABS Take 1 tablet by mouth daily. For energy    Historical Provider, MD  omeprazole  (PRILOSEC) 20 MG capsule Take 1 capsule (20 mg total) by mouth daily. Patient taking differently: Take 20 mg by mouth daily as needed (Acid Reflux).  01/31/15   [name], MD  [facility] DELICA LANCETS [id number] MISC Use to test glucose up to 6 times a day 12/27/15   [name], MD  potassium chloride SA (K-DUR,KLOR-CON) 20 MEQ tablet Take 1 tablet (20 mEq total) by mouth daily. 11/10/15   Orvan Falconer, MD  SUMAtriptan (IMITREX) 50 MG tablet TAKE 1 TABLET BY MOUTH ONCE. MAY REPEAT IN 2 HOURS IF HEADACHE PERSISTS OR RECURS. 12/21/15   [name], MD  vitamin C (ASCORBIC ACID) 500 MG tablet Take 500 mg by mouth daily.    Historical Provider, MD   LMP 12/11/2015 Physical Exam  Constitutional: She appears well-developed and well-nourished. No distress.  HENT:  Head: Normocephalic and atraumatic.  Mouth/Throat: Oropharynx is clear and moist. No oropharyngeal exudate.  Mucous membranes appear dry  Eyes: Conjunctivae and EOM are normal. Pupils are equal, round, and reactive to light. Right eye exhibits no discharge. Left eye exhibits no discharge. No scleral icterus.  No jaundice  Neck: Normal range of motion. Neck supple. No JVD present. No thyromegaly present.  No lymphadenopathy trismus or torticollis  Cardiovascular: Normal rate, regular rhythm, normal heart sounds and intact distal pulses.  Exam reveals no gallop and no friction rub.   No murmur heard. Pulmonary/Chest: Effort normal and breath sounds normal. No respiratory distress. She has no wheezes. She has no rales.  Abdominal: Soft. Bowel sounds are normal. She exhibits no distension and no mass. There is tenderness (focal tenderness with guarding in the epigastrium, minimal tenderness in the right and left upper quadrant, no lower abdominal tenderness, very soft).  Musculoskeletal: Normal range of motion. She exhibits no edema or tenderness.  Lymphadenopathy:    She has no cervical adenopathy.  Neurological: She is alert.  Coordination normal.  Skin: Skin is warm and dry. No rash noted. No erythema.  Psychiatric: She has a normal mood and affect. Her behavior is normal.  Nursing note and vitals reviewed.   ED Course  Procedures (including critical care time) Labs Review Labs Reviewed  COMPREHENSIVE METABOLIC PANEL - Abnormal; Notable for the following:    Potassium 3.4 (*)    CO2 20 (*)    Glucose, Bld 114 (*)    Creatinine, Ser 0.41 (*)    All other components within normal limits  CBC WITH DIFFERENTIAL/PLATELET - Abnormal; Notable for the following:    Platelets 410 (*)    All other components within normal limits  CBG MONITORING, ED - Abnormal; Notable for the following:    Glucose-Capillary 103 (*)    All other components within normal limits  LIPASE, BLOOD  I-STAT BETA HCG BLOOD, ED ([facility], [facility], [facility] ONLY)    Imaging Review No results found. I  have personally reviewed and evaluated these images and lab results as part of my medical decision-making.   MDM   Final diagnoses:  Other chronic pancreatitis ([place])    The patient likely has recurrent pancreatitis. She appears dehydrated and clinically she has not had very much to eat or drink in the last 3 days. She will need IV fluids, labs and medications for both nausea and pain  The patient and her family member are in agreement with the plan. She has been having normal bowel movements and passing gas  Labs are unremarkable - She has some imp;rovement but has ongoing pain and nausea - she states she feels she needs overnight observation - i would agree that her sx are not under good control  D/w Dr. Darrick Meigs who will admit.  Medications  0.9 %  sodium chloride infusion (not administered)  sodium chloride 0.9 % bolus 1,000 mL (1,000 mLs Intravenous New Bag/Given 01/01/16 0147)  sodium chloride 0.9 % bolus 1,000 mL (0 mLs Intravenous Stopped 01/01/16 0150)  ondansetron (ZOFRAN) injection 4 mg (4 mg Intravenous Given 01/01/16 0101)  HYDROmorphone  (DILAUDID) injection 1 mg (1 mg Intravenous Given 01/01/16 0101)  HYDROmorphone (DILAUDID) injection 1 mg (1 mg Intravenous Given 01/01/16 0147)     Noemi Chapel, MD 01/01/16 [phone]

## 2016-01-01 NOTE — Progress Notes (Signed)
Patient ID: Linda Walls, female   DOB: [birth year], 25 y.o.   MRN: [id number]                                                                PROGRESS NOTE                                                                                                                                                                                                             Patient Demographics:    Linda Walls, is a 25 y.o. female, DOB - 21-Feb-1991, [id number]  Admit date - 01/01/2016   Admitting Physician Linda Hillock, MD  Outpatient Primary MD for the patient is Linda Hillier, MD  LOS - 1d  Outpatient Specialists:  Linda Walls ([place])  Chief Complaint  Patient presents with  . Emesis       Brief Narrative   25 y.o. female, History of diabetes mellitus, chronic pancreatitis, diagnosed at the age of 14. Family history of hereditary pancreatitis. Patient had abdominal surgery, pancreatic stent placement and enteric stent removal. Today patient came to the hospital with frequent episodes of nausea vomiting and epigastric for past 3 days. Patient had almost 10 episodes of vomiting today. Patient takes Percocet 3 times a week at home.  In the ED patient was found to have normal lipase.  Subjective:    Linda Walls today has, still some abdominal pain, epigastric discomfort.  Pt is improving on dilaudid and zofran and fluid.  Pt will try clear liquid diet today.   Pt denies headache, chest pain, sob.     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  Abdominal pain secondary to chronic pancreatitis Clear liquids NS iv Dilaudid iv Cbc, cmp in am  2.  N/v zofran  3. Dm fsbs ac and qhs, iss   Code Status : FULL CODE  Family Communication  : patient  Disposition Plan  : home  Barriers For Discharge :   none  Consults  :  none  Procedures   : none  DVT Prophylaxis  :  Lovenox -  SCDs   Lab Results  Component Value Date  PLT 410* 01/01/2016    Antibiotics  :  none  Anti-infectives    None        Objective:   Filed Vitals:   01/01/16 0300 01/01/16 0400 01/01/16 0446 01/01/16 0516  BP: 107/58 102/67  102/63  Pulse: 90 85  70  Temp:    98.7 F (37.1 C)  TempSrc:    Oral  Height:   5\' 2"  (1.575 m)   Weight:   53.388 kg (117 lb 11.2 oz)   SpO2: 100% 98%  93%    Wt Readings from Last 3 Encounters:  01/01/16 53.388 kg (117 lb 11.2 oz)  12/27/15 53.978 kg (119 lb)  12/13/15 52.617 kg (116 lb)    No intake or output data in the 24 hours ending 01/01/16 0854   Physical Exam  Awake Alert, Oriented X 3, No new F.N deficits, Normal affect [place].AT,PERRAL Supple Neck,No JVD, No cervical lymphadenopathy appriciated.  Symmetrical Chest wall movement, Good air movement bilaterally, CTAB RRR,No Gallops,Rubs or new Murmurs, No Parasternal Heave +ve B.Sounds, Abd Soft, No tenderness, No organomegaly appriciated, No rebound - guarding or rigidity. No Cyanosis, Clubbing or edema, No new Rash or bruise  Negative cullens sign    Data Review:    CBC  Recent Labs Lab 01/01/16 0046  WBC 9.7  HGB 13.7  HCT 39.7  PLT 410*  MCV 87.1  MCH 30.0  MCHC 34.5  RDW 12.4  LYMPHSABS 1.5  MONOABS 0.4  EOSABS 0.1  BASOSABS 0.0    Chemistries   Recent Labs Lab 01/01/16 0046  NA 138  K 3.4*  CL 108  CO2 20*  GLUCOSE 114*  BUN 10  CREATININE 0.41*  CALCIUM 9.8  AST 20  ALT 16  ALKPHOS 72  BILITOT 0.5   ------------------------------------------------------------------------------------------------------------------ No results for input(s): CHOL, HDL, LDLCALC, TRIG, CHOLHDL, LDLDIRECT in the last 72 hours.  Lab Results  Component Value Date   HGBA1C 10.4* 12/06/2015   ------------------------------------------------------------------------------------------------------------------ No results for  input(s): TSH, T4TOTAL, T3FREE, THYROIDAB in the last 72 hours.  Invalid input(s): FREET3 ------------------------------------------------------------------------------------------------------------------ No results for input(s): VITAMINB12, FOLATE, FERRITIN, TIBC, IRON, RETICCTPCT in the last 72 hours.  Coagulation profile No results for input(s): INR, PROTIME in the last 168 hours.  No results for input(s): DDIMER in the last 72 hours.  Cardiac Enzymes No results for input(s): CKMB, TROPONINI, MYOGLOBIN in the last 168 hours.  Invalid input(s): CK ------------------------------------------------------------------------------------------------------------------ No results found for: BNP  Inpatient Medications  Scheduled Meds: . enoxaparin (LOVENOX) injection  40 mg Subcutaneous Q24H  . insulin aspart  0-9 Units Subcutaneous TID WC  . Insulin Degludec  24 Units Subcutaneous QHS  . lipase/protease/amylase  24,000 Units Oral TID WC   Continuous Infusions: . sodium chloride     PRN Meds:.acetaminophen **OR** acetaminophen, HYDROmorphone (DILAUDID) injection, HYDROmorphone (DILAUDID) injection, ondansetron (ZOFRAN) IV, ondansetron **OR** ondansetron (ZOFRAN) IV  Micro Results No results found for this or any previous visit (from the past 240 hour(s)).  Radiology Reports No results found.  Time Spent in minutes  30   Linda Walls M.D on 01/01/2016 at 8:54 AM  Between 7am to 7pm - Pager - [phone]  After 7pm go to [email or web address] - password [facility]  [facility] -  Office  [phone]

## 2016-01-01 NOTE — ED Notes (Signed)
Report given to RN on 300 

## 2016-01-01 NOTE — H&P (Signed)
TRH H&P   Patient Demographics:    Linda Walls, is a 25 y.o. female  MRN: [id number]   DOB - 1991/02/25  Admit Date - 01/01/2016  Outpatient Primary MD for the patient is [name], MD  Referring MD/NP/PA: Dr Sabra Heck  Patient coming from: home  Chief Complaint  Patient presents with  . Emesis      HPI:    Linda Walls  is a 25 y.o. female, History of diabetes mellitus, chronic pancreatitis, diagnosed at the age of 62. Family history of hereditary pancreatitis. Patient had abdominal surgery, pancreatic stent placement and enteric stent removal. Today patient came to the hospital with frequent episodes of nausea vomiting and epigastric for past 3 days. Patient had almost 10 episodes of vomiting today. Patient takes Percocet 3 times a week at home. In the ED patient was found to have normal lipase.    Review of systems:    In addition to the HPI above,  No Fever-chills, No Headache, No changes with Vision or hearing, No problems swallowing food or Liquids, No Chest pain, Cough or Shortness of Breath, No Blood in stool or Urine, No dysuria, No new skin rashes or bruises, No new joints pains-aches,  No new weakness, tingling, numbness in any extremity, No recent weight gain or loss, No polyuria, polydypsia or polyphagia, No significant Mental Stressors.  A full 10 point Review of Systems was done, except as stated above, all other Review of Systems were negative.   With Past History of the following :    Past Medical History  Diagnosis Date  . Hereditary pancreatitis   . DM type 1 (diabetes mellitus, type 1) ([place])   . Hernia   . GERD (gastroesophageal reflux disease)   . Jaundice   . DKA (diabetic ketoacidoses) [facility])       Past Surgical History  Procedure Laterality Date  . Hernia repair      As infant.  . Bile duct stent placement  2006 at [facility]  . Pancreatic cyst  drainage  at age 31 year      Social History:     Social History  Substance Use Topics  . Smoking status: Never Smoker   . Smokeless tobacco: Never Used  . Alcohol Use: No       Family History :     Family History  Problem Relation Age of Onset  . Pancreatitis Father 30    hereditary  . Hypertension Father   . GER disease Father   . Breast cancer Maternal Aunt   . Congenital heart disease Maternal Grandfather   . Prostate cancer Maternal Grandfather   . Alzheimer's disease Maternal Grandfather   . Hypothyroidism Mother       Home Medications:   Prior to Admission medications   Medication Sig Start Date End Date Taking? Authorizing Provider  CREON 24000 units CPEP Take 1 capsule by mouth 3 (three) times daily with meals. 10/30/15   Historical Provider, MD  Cyanocobalamin (B-12 PO) Take 1 tablet by  mouth daily.    Historical Provider, MD  ferrous sulfate 325 (65 FE) MG tablet Take 325 mg by mouth daily with breakfast.    Historical Provider, MD  GLUCAGON EMERGENCY 1 MG injection Inject 1 mg as directed once. 03/13/15   Historical Provider, MD  hyoscyamine (LEVSIN SL) 0.125 MG SL tablet PLACE 1 TABLET UNDER THE TONGUE EVERY 4 HOURS AS NEEDED FOR PAIN. 09/17/15   [name], MD  insulin aspart (NOVOLOG) 100 UNIT/ML FlexPen Inject 12-18 Units into the skin 3 (three) times daily with meals. Patient taking differently: Inject 15-21 Units into the skin 3 (three) times daily with meals.  09/06/15   [name], MD  Insulin Degludec (TRESIBA FLEXTOUCH [place]) Inject 24 Units into the skin at bedtime.    Historical Provider, MD  Insulin Pen Needle (BD PEN NEEDLE NANO U/F) 32G X 4 MM MISC 1 each by Does not apply route 4 (four) times daily. 12/27/15   [name], MD  magnesium oxide (MAG-OX) 400 (241.3 Mg) MG tablet Take 1 tablet (400 mg total) by mouth daily. 11/10/15   Orvan Falconer, MD  Multiple Vitamins-Minerals (MULTIVITAMINS THER. W/MINERALS) TABS Take 1 tablet by mouth  daily. For energy    Historical Provider, MD  omeprazole (PRILOSEC) 20 MG capsule Take 1 capsule (20 mg total) by mouth daily. Patient taking differently: Take 20 mg by mouth daily as needed (Acid Reflux).  01/31/15   [name], MD  [facility] DELICA LANCETS [id number] MISC Use to test glucose up to 6 times a day 12/27/15   [name], MD  potassium chloride SA (K-DUR,KLOR-CON) 20 MEQ tablet Take 1 tablet (20 mEq total) by mouth daily. 11/10/15   Orvan Falconer, MD  SUMAtriptan (IMITREX) 50 MG tablet TAKE 1 TABLET BY MOUTH ONCE. MAY REPEAT IN 2 HOURS IF HEADACHE PERSISTS OR RECURS. 12/21/15   [name], MD  vitamin C (ASCORBIC ACID) 500 MG tablet Take 500 mg by mouth daily.    Historical Provider, MD     Allergies:     Allergies  Allergen Reactions  . Shellfish Allergy Nausea And Vomiting  . Cefzil [Cefprozil] Rash  . Penicillins Rash    Has patient had a PCN reaction causing immediate rash, facial/tongue/throat swelling, SOB or lightheadedness with hypotension: NO Has patient had a PCN reaction causing severe rash involving mucus membranes or skin necrosis: NO Has patient had a PCN reaction that required hospitalization: NO Has patient had a PCN reaction occurring within the last 10 years: NO If all of the above answers are "NO", then may proceed with Cephalosporin use.   . Sulfa Antibiotics Rash     Physical Exam:   Vitals  Blood pressure 102/67, pulse 85, height 5\' 2"  (1.575 m), weight 53.388 kg (117 lb 11.2 oz), last menstrual period 12/11/2015, SpO2 98 %.   1. General caucasian female* lying in bed in NAD, cooperative with exam  2. Normal affect and insight, Not Suicidal or Homicidal, Awake Alert, Oriented X 3.  3. No F.N deficits, ALL C.Nerves Intact, Strength 5/5 all 4 extremities, Sensation intact all 4 extremities, Plantars down going.  4. Ears and Eyes appear Normal, Conjunctivae clear, PERRLA. Moist Oral Mucosa.  5. Supple Neck, No JVD, No cervical  lymphadenopathy appriciated, No Carotid Bruits.  6. Symmetrical Chest wall movement, Good air movement bilaterally, CTAB.  7. RRR, No Gallops, Rubs or Murmurs, No Parasternal Heave.  8. Positive Bowel Sounds, Abdomen Soft, No tenderness, No organomegaly appriciated,No  rebound -guarding or rigidity.  9.  No Cyanosis, Normal Skin Turgor, No Skin Rash or Bruise.  10. Good muscle tone,  joints appear normal , no effusions, Normal ROM.      Data Review:    CBC  Recent Labs Lab 01/01/16 0046  WBC 9.7  HGB 13.7  HCT 39.7  PLT 410*  MCV 87.1  MCH 30.0  MCHC 34.5  RDW 12.4  LYMPHSABS 1.5  MONOABS 0.4  EOSABS 0.1  BASOSABS 0.0   ------------------------------------------------------------------------------------------------------------------  Chemistries   Recent Labs Lab 01/01/16 0046  NA 138  K 3.4*  CL 108  CO2 20*  GLUCOSE 114*  BUN 10  CREATININE 0.41*  CALCIUM 9.8  AST 20  ALT 16  ALKPHOS 72  BILITOT 0.5   -------------------------------------------------------------------------------------------------------------------    ---------------------------------------------------------------------------------------------------------------  Urinalysis    Component Value Date/Time   COLORURINE YELLOW 11/06/2015 2220   APPEARANCEUR CLEAR 11/06/2015 2220   LABSPEC 1.025 11/06/2015 2220   PHURINE 5.5 11/06/2015 2220   GLUCOSEU 500* 11/06/2015 2220   HGBUR NEGATIVE 11/06/2015 2220   BILIRUBINUR NEGATIVE 11/06/2015 2220   KETONESUR >80* 11/06/2015 2220   PROTEINUR NEGATIVE 11/06/2015 2220   UROBILINOGEN 0.2 05/06/2015 0045   NITRITE NEGATIVE 11/06/2015 2220   LEUKOCYTESUR NEGATIVE 11/06/2015 2220    ----------------------------------------------------------------------------------------------------------------   Imaging Results:        Assessment & Plan:    Active Problems:   Intractable nausea and vomiting   Uncontrolled diabetes mellitus  secondary to pancreatic insufficiency (HCC)   Pancreatitis      1. Intractable nausea and vomiting- likely from recurrent inflammation of pancreas. The lipase is within normal limits, continue IV fluids, Zofran when necessary for nausea and vomiting. 2. Diabetes mellitus- continue with Insulin Degludec,  Sliding scale insulin. 3. Recurrent pancreatitis- continue Creon 24,000 units po tid   DVT Prophylaxis-   Lovenox   AM Labs Ordered, also please review Full Orders  Family Communication: Admission, patients condition and plan of care including tests being ordered have been discussed with the patient and her mother at bedside* who indicate understanding and agree with the plan   Code Status:  Full code  Admission status: Observation  Time spent in minutes : 60 min   Monee Dembeck S M.D on 01/01/2016 at 5:15 AM  Between 7am to 7pm - Pager - [phone]. After 7pm go to [email or web address] - password [facility]  [facility] - Office  [phone]

## 2016-01-02 DIAGNOSIS — R110 Nausea: Secondary | ICD-10-CM | POA: Diagnosis not present

## 2016-01-02 DIAGNOSIS — G8929 Other chronic pain: Secondary | ICD-10-CM | POA: Diagnosis present

## 2016-01-02 DIAGNOSIS — Z820 Family history of epilepsy and other diseases of the nervous system: Secondary | ICD-10-CM | POA: Diagnosis not present

## 2016-01-02 DIAGNOSIS — K8681 Exocrine pancreatic insufficiency: Secondary | ICD-10-CM

## 2016-01-02 DIAGNOSIS — K219 Gastro-esophageal reflux disease without esophagitis: Secondary | ICD-10-CM | POA: Diagnosis present

## 2016-01-02 DIAGNOSIS — K8689 Other specified diseases of pancreas: Secondary | ICD-10-CM

## 2016-01-02 DIAGNOSIS — K861 Other chronic pancreatitis: Secondary | ICD-10-CM | POA: Diagnosis present

## 2016-01-02 DIAGNOSIS — Z880 Allergy status to penicillin: Secondary | ICD-10-CM | POA: Diagnosis not present

## 2016-01-02 DIAGNOSIS — R1110 Vomiting, unspecified: Secondary | ICD-10-CM | POA: Diagnosis not present

## 2016-01-02 DIAGNOSIS — Z8249 Family history of ischemic heart disease and other diseases of the circulatory system: Secondary | ICD-10-CM | POA: Diagnosis not present

## 2016-01-02 DIAGNOSIS — E0865 Diabetes mellitus due to underlying condition with hyperglycemia: Secondary | ICD-10-CM

## 2016-01-02 DIAGNOSIS — Z794 Long term (current) use of insulin: Secondary | ICD-10-CM | POA: Diagnosis not present

## 2016-01-02 DIAGNOSIS — R112 Nausea with vomiting, unspecified: Secondary | ICD-10-CM | POA: Diagnosis present

## 2016-01-02 DIAGNOSIS — Z882 Allergy status to sulfonamides status: Secondary | ICD-10-CM | POA: Diagnosis not present

## 2016-01-02 DIAGNOSIS — Z803 Family history of malignant neoplasm of breast: Secondary | ICD-10-CM | POA: Diagnosis not present

## 2016-01-02 DIAGNOSIS — Z91013 Allergy to seafood: Secondary | ICD-10-CM | POA: Diagnosis not present

## 2016-01-02 DIAGNOSIS — K859 Acute pancreatitis without necrosis or infection, unspecified: Secondary | ICD-10-CM | POA: Diagnosis present

## 2016-01-02 DIAGNOSIS — E1065 Type 1 diabetes mellitus with hyperglycemia: Secondary | ICD-10-CM | POA: Diagnosis present

## 2016-01-02 LAB — COMPREHENSIVE METABOLIC PANEL
ALBUMIN: 4.2 g/dL (ref 3.5–5.0)
ALT: 15 U/L (ref 14–54)
AST: 19 U/L (ref 15–41)
Alkaline Phosphatase: 64 U/L (ref 38–126)
Anion gap: 7 (ref 5–15)
BILIRUBIN TOTAL: 0.7 mg/dL (ref 0.3–1.2)
BUN: 5 mg/dL — AB (ref 6–20)
CHLORIDE: 106 mmol/L (ref 101–111)
CO2: 25 mmol/L (ref 22–32)
CREATININE: 0.38 mg/dL — AB (ref 0.44–1.00)
Calcium: 9 mg/dL (ref 8.9–10.3)
GFR calc Af Amer: >60 mL/min (ref >60)
GFR calc non Af Amer: >60 mL/min (ref >60)
GLUCOSE: 94 mg/dL (ref 65–99)
POTASSIUM: 3.6 mmol/L (ref 3.5–5.1)
Sodium: 138 mmol/L (ref 135–145)
Total Protein: 7.1 g/dL (ref 6.5–8.1)

## 2016-01-02 LAB — CBC
HEMATOCRIT: 40.4 % (ref 36.0–46.0)
Hemoglobin: 13.3 g/dL (ref 12.0–15.0)
MCH: 29.8 pg (ref 26.0–34.0)
MCHC: 32.9 g/dL (ref 30.0–36.0)
MCV: 90.4 fL (ref 78.0–100.0)
PLATELETS: 392 10*3/uL (ref 150–400)
RBC: 4.47 MIL/uL (ref 3.87–5.11)
RDW: 12.5 % (ref 11.5–15.5)
WBC: 8.1 10*3/uL (ref 4.0–10.5)

## 2016-01-02 LAB — HEMOGLOBIN A1C
Hgb A1c MFr Bld: 9.4 % — ABNORMAL HIGH (ref 4.8–5.6)
MEAN PLASMA GLUCOSE: 223 mg/dL

## 2016-01-02 LAB — GLUCOSE, CAPILLARY
GLUCOSE-CAPILLARY: 84 mg/dL (ref 65–99)
GLUCOSE-CAPILLARY: 146 mg/dL — AB (ref 65–99)
GLUCOSE-CAPILLARY: 229 mg/dL — AB (ref 65–99)
Glucose-Capillary: 69 mg/dL (ref 65–99)
Glucose-Capillary: 59 mg/dL — ABNORMAL LOW (ref 65–99)

## 2016-01-02 MED ORDER — OXYCODONE-ACETAMINOPHEN 10-325 MG PO TABS: 1.0000 | ORAL_TABLET | ORAL | Status: DC | PRN

## 2016-01-02 MED ORDER — OXYCODONE-ACETAMINOPHEN 5-325 MG PO TABS
1.0000 | ORAL_TABLET | ORAL | Status: DC | PRN
  Administered 2016-01-02 – 2016-01-04 (×8): 1 via ORAL
  Filled 2016-01-02 (×8): qty 1

## 2016-01-02 MED ORDER — INSULIN GLARGINE 100 UNIT/ML ~~LOC~~ SOLN
22.0000 [IU] | Freq: Every day | SUBCUTANEOUS | Status: DC
  Administered 2016-01-02 – 2016-01-03 (×2): 22 [IU] via SUBCUTANEOUS
  Filled 2016-01-02 (×5): qty 0.22

## 2016-01-02 MED ORDER — OXYCODONE HCL 5 MG PO TABS
5.0000 mg | ORAL_TABLET | ORAL | Status: DC | PRN
  Administered 2016-01-03 – 2016-01-04 (×6): 5 mg via ORAL
  Filled 2016-01-02 (×6): qty 1

## 2016-01-02 NOTE — Progress Notes (Addendum)
Inpatient Diabetes Program Recommendations  AACE/ADA: New Consensus Statement on Inpatient Glycemic Control (2015)  Target Ranges:  Prepandial:   less than 140 mg/dL      Peak postprandial:   less than 180 mg/dL (1-2 hours)      Critically ill patients:  140 - 180 mg/dL   Results for DANICE, STUVE (MRN [id number]) as of 01/02/2016 09:55  Ref. Range 01/01/2016 07:42 01/01/2016 11:19 01/01/2016 16:32 01/01/2016 21:09  Glucose-Capillary Latest Ref Range: 65-99 mg/dL 75 179 (H) 75 184 (H)   Results for LASAUNDRA, BLUST (MRN [id number]) as of 01/02/2016 09:55  Ref. Range 01/02/2016 07:27  Glucose-Capillary Latest Ref Range: 65-99 mg/dL 69    Admit with: Vomiting  History: Type 1 DM, Hereditary Pancreatitis  Home DM Meds: Tresiba basal insulin 24 units QHS       Novolog 8 units with meals when CBG 70-90 mg/dl       Novolog 15 units with meals when CBG 90-150 mg/dl       Additional Novolog SSI for CBG >150 mg/dl  Current Insulin Orders: Lantus 24 units QHS       Novolog Sensitive Correction Scale/ SSI (0-9 units) TID AC      -Note patient saw her Endocrinologist (Dr. Dorris Fetch in [place]) on 12/27/15.  At that visit, Tresiba insulin was decreased to 24 units QHS (from 30 units) and Novolog regimen was adjusted as well.  -CBGs stable so far.  Mild Hypoglycemic event this AM.     MD- Please consider reducing Lantus slightly to 22 units QHS     --Will follow patient during hospitalization--  Wyn Quaker RN, MSN, CDE Diabetes Coordinator Inpatient Glycemic Control Team Team Pager: [phone] (8a-5p)

## 2016-01-02 NOTE — Care Management Note (Signed)
Case Management Note  Patient Details  Name: Linda Walls MRN: [id number] Date of Birth: 10/20/1990  Subjective/Objective:     Patient is from home, ind with ADL's. Admitted with pancreatitis. She is a patient of Dr. [name] and has [employer].              Action/Plan: Anticipate d/c home with self care.   Expected Discharge Date:  01/03/16               Expected Discharge Plan:  Home/Self Care  In-House Referral:  NA  Discharge planning Services  CM Consult  Post Acute Care Choice:  NA Choice offered to:  NA  DME Arranged:    DME Agency:     HH Arranged:    HH Agency:     Status of Service:  Completed, signed off  If discussed at [employer] of Stay Meetings, dates discussed:    Additional Comments:  Atisha Hamidi, Chauncey Reading, RN 01/02/2016, 11:28 AM

## 2016-01-02 NOTE — Progress Notes (Signed)
PROGRESS NOTE  Linda Walls [id number] DOB: July 18, 1990 DOA: 01/01/2016 PCP: Linda Hillier, MD  Brief Narrative: 25 year old woman past medical history chronic pancreatitis presented with complaints of frequent episodes of nausea, vomiting, and epigastric pain for several days. Admitted for intractable nausea, vomiting, abdominal pain, chronic pancreatitis.  Assessment/Plan: 1. Intractable nausea and vomiting. Lipase within normal limits on admission. Presumed flare of chronic pancreatitis. 2. Abdominal pain secondary to chronic hereditary pancreatitis. CBC and CMP unremarkable. She reports some level of chronic pain, for which she takes Percocet 3 times a week.  3. Chronic pancreatitis, pancreatic insufficiency. Followed by endocrinology. 4. Diabetes mellitus type 1 uncontrolled. Secondary to pancreatic insufficiency. A1c 9.4. Blood sugars currently stable. 5. GERD. Continue PPI.   Ongoing nausea with poor oral intake. Will continue current management with fluids and pain management.    DVT prophylaxis: lovenox Code Status: full Family Communication: discussed with patient and mother at bedside. Disposition Plan: discharge home once improved  Linda Hodgkins, MD  [facility] Direct contact: [phone] --[place]ia [place]  --[email or web address]; password [email or web address]  7PM-7AM contact night coverage as above 01/02/2016, 3:03 PM    Consultants:  none  Procedures:  none  Antimicrobials:  none  HPI/Subjective: Feels worse today. More nausea, vomiting, and abd pain. Has had some small amounts of fluids, but cannot keep anything down. She has not had a BM since yesterday.   Objective: Filed Vitals:   01/01/16 1327 01/01/16 2110 01/02/16 0514 01/02/16 1448  BP: 114/70 121/77 122/83 119/69  Pulse: 61 70 83 68  Temp: 98 F (36.7 C) 98.3 F (36.8 C) 97.9 F (36.6 C)   TempSrc: Oral Oral Oral   Resp: 20 20 18 20   Height:      Weight:      SpO2: 100% 100% 100% 100%     Intake/Output Summary (Last 24 hours) at 01/02/16 1503 Last data filed at 01/02/16 1300  Gross per 24 hour  Intake    840 ml  Output   1200 ml  Net   -360 ml     Filed Weights   01/01/16 0446  Weight: 53.388 kg (117 lb 11.2 oz)    Exam:    Constitutional:  . Appears calm and comfortable Respiratory:  . CTA bilaterally, no w/r/r.  . Respiratory effort normal. No retractions or accessory muscle use Cardiovascular:  . RRR, no m/r/g . No LE extremity edema   Abdomen:  . Abdomen appears normal; non-distended, mild epigastric tenderness Psychiatric:  . judgement and insight appear normal . Mental status o Mood, affect appropriate  I have personally reviewed following labs and imaging studies:  CBG 146  CMP unremarkable.   CBC unremarkable.  Scheduled Meds: . enoxaparin (LOVENOX) injection  40 mg Subcutaneous Q24H  . insulin aspart  0-9 Units Subcutaneous TID WC  . insulin glargine  22 Units Subcutaneous QHS  . lipase/protease/amylase  24,000 Units Oral TID WC   Continuous Infusions: . 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 Sarajane Jews, MD. Electronically Signed: [name], Scribe.  01/02/2016  12:24pm  I personally performed the services described in this documentation. All  medical record entries made by the scribe were at my direction. I have reviewed the chart and agree that the record reflects my personal performance and is accurate and complete. Linda Hodgkins, MD

## 2016-01-03 DIAGNOSIS — R110 Nausea: Secondary | ICD-10-CM

## 2016-01-03 LAB — GLUCOSE, CAPILLARY
GLUCOSE-CAPILLARY: 88 mg/dL (ref 65–99)
GLUCOSE-CAPILLARY: 131 mg/dL — AB (ref 65–99)
Glucose-Capillary: 83 mg/dL (ref 65–99)
Glucose-Capillary: 146 mg/dL — ABNORMAL HIGH (ref 65–99)

## 2016-01-03 NOTE — Progress Notes (Signed)
PROGRESS NOTE  Linda Walls [id number] DOB: 07/14/1990 DOA: 01/01/2016 PCP: [name], MD  Brief Narrative: 25 year old woman past medical history chronic pancreatitis presented with complaints of frequent episodes of nausea, vomiting, and epigastric pain for several days. Admitted for intractable nausea, vomiting, abdominal pain, chronic pancreatitis.  Assessment/Plan: 1. Nausea, vomiting secondary to abdominal pain/hereditary pancreatitis with acute flare of chronic pain. Nausea improved, vomiting resolved, pain control. Tolerating liquids. Slowly improving. Lipase was normal on admission. 2. Abdominal pain secondary to chronic hereditary pancreatitis, with acute flare. Improving. CBC and CMP were unremarkable on admission. Continue Percocet for chronic abdominal pain. 3. Chronic pancreatitis, pancreatic insufficiency. Followed by endocrinology as an outpatient. 4. Diabetes mellitus type 1 uncontrolled. Secondary to pancreatic insufficiency. A1c 9.4. Blood sugars currently stable. 5. GERD. Continue PPI.   Overall improved with better control of nausea, tolerating liquids, pain controlled and vomiting resolved.  Advance to full liquid diet.  Possibly home next 24 hours.  DVT prophylaxis: lovenox Code Status: full Family Communication: discussed with patient and mother at bedside. Disposition Plan: discharge home once improved  Murray Hodgkins, MD  [facility] Direct contact: [phone] --[place]ia [place]  --[email or web address]; password [email or web address]  7PM-7AM contact night coverage as above 01/03/2016, 6:41 AM  LOS: 1 day   Consultants:  none  Procedures:  none  Antimicrobials:  none  HPI/Subjective: Somewhat better today. Nausea control. Tolerating liquids. No vomiting. Pain control. No diarrhea. Requesting advancement to full liquids.  Objective: Filed Vitals:   01/02/16 1448 01/02/16 1938 01/02/16 2110 01/03/16 0556  BP: 119/69  122/78 113/69  Pulse: 68   66 65  Temp:   98.2 F (36.8 C) 98 F (36.7 C)  TempSrc:   Oral Oral  Resp: 20  18 20   Height:      Weight:      SpO2: 100% 100% 100% 100%    Intake/Output Summary (Last 24 hours) at 01/03/16 0641 Last data filed at 01/03/16 0556  Gross per 24 hour  Intake   1080 ml  Output   1200 ml  Net   -120 ml     Filed Weights   01/01/16 0446  Weight: 53.388 kg (117 lb 11.2 oz)    Exam: Constitutional:  . Appears calm and comfortable Respiratory:  . CTA bilaterally, no w/r/r.  . Respiratory effort normal. No retractions or accessory muscle use Cardiovascular:  . RRR, no m/r/g . No LE extremity edema   Abdomen:  . Abdomen appears normal; mild LLQ pain. Positive bowel sounds. nondistended Psychiatric:  . judgement and insight appear normal . Mental status o Mood, affect appropriate  I have personally reviewed following labs and imaging studies:  CBG 131, stable   Scheduled Meds: . enoxaparin (LOVENOX) injection  40 mg Subcutaneous Q24H  . insulin aspart  0-9 Units Subcutaneous TID WC  . insulin glargine  22 Units Subcutaneous QHS  . lipase/protease/amylase  24,000 Units Oral TID WC   Continuous Infusions: . 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 Sarajane Jews, MD. Electronically Signed: Delene Ruffini, Scribe.  01/03/2016  12:25pm  I personally performed the services described in this documentation. All medical record entries made by the scribe were at my direction. I have reviewed the chart and agree that the record reflects my  personal performance and is accurate and complete. Murray Hodgkins, MD

## 2016-01-03 NOTE — Discharge Summary (Signed)
Physician Discharge Summary  Linda Walls [id number] DOB: 09/06/1990 DOA: 01/01/2016  PCP: [name], MD  Admit date: 01/01/2016 Discharge date: 01/04/2016  Recommendations for Outpatient Follow-up:  1. No specific follow-up recommendations  Follow-up Information    Follow up with [name], MD.   Specialty:  Family Medicine   Why:  As needed   Contact information:   [place] [address] [phone]      Discharge Diagnoses:  1. Intractable nausea and vomitingSecondary to chronic pancreatitis 2. Abdominal pain secondary to chronic pancreatitis 3. Diabetes mellitus type 1, uncontrolled 4. GERD  Discharge Condition: improved Disposition:  home  Diet recommendation: Carb modified  Filed Weights   01/01/16 0446  Weight: 53.388 kg (117 lb 11.2 oz)    History of present illness:  25 year old woman past medical history chronic pancreatitis presented with complaints of frequent episodes of nausea, vomiting, and epigastric pain for several days. Admitted for intractable nausea, vomiting, abdominal pain, chronic pancreatitis.  Hospital Course:  Patient was treated with supportive care, IV fluids, pain control, antiemetics with gradual clinical improvement. She's now tolerating a diet and close to baseline. Hospitalization was uncomplicated. Individual issues as below.   Nausea, vomiting secondary to abdominal pain/hereditary pancreatitis with acute flare of chronic pain. Resolved. Lipase was normal on admission. Nausea improved, vomiting resolved, improved pain control. Tolerating solid diet well.   Abdominal pain secondary to chronic hereditary pancreatitis, with acute flare. Now stable. Continue Percocet for chronic abdominal pain.  Chronic pancreatitis, pancreatic insufficiency. Followed by endocrinology as an outpatient.  Diabetes mellitus type 1 uncontrolled. Secondary to pancreatic insufficiency. A1c 9.4. Blood sugars remain  stable.  GERD. Continue PPI.  Consultants:  none  Procedures:  none  Antimicrobials:  None  Discharge Instructions  Discharge Instructions    Activity as tolerated - No restrictions    Complete by:  As directed      Diet Carb Modified    Complete by:  As directed      Discharge instructions    Complete by:  As directed   Call your physician or seek immediate medical attention for vomiting, worsening pain, bloating or worsening of condition.          Discharge Medication List as of 01/04/2016 11:01 AM    CONTINUE these medications which have CHANGED   Details  insulin aspart (NOVOLOG) 100 UNIT/ML FlexPen Inject 15-21 Units into the skin 3 (three) times daily with meals., Starting 01/04/2016, Until Discontinued, No Print      CONTINUE these medications which have NOT CHANGED   Details  CREON 24000 units CPEP Take 1 capsule by mouth 3 (three) times daily with meals., Starting 10/30/2015, Until Discontinued, Historical Med    Cyanocobalamin (B-12 PO) Take 1 tablet by mouth daily., Until Discontinued, Historical Med    ferrous sulfate 325 (65 FE) MG tablet Take 325 mg by mouth daily with breakfast., Until Discontinued, Historical Med    GLUCAGON EMERGENCY 1 MG injection Inject 1 mg as directed once., Starting 03/13/2015, Historical Med    hyoscyamine (LEVSIN SL) 0.125 MG SL tablet PLACE 1 TABLET UNDER THE TONGUE EVERY 4 HOURS AS NEEDED FOR PAIN., Normal    Insulin Degludec (TRESIBA FLEXTOUCH [place]) Inject 24 Units into the skin at bedtime., Until Discontinued, Historical Med    Insulin Pen Needle (BD PEN NEEDLE NANO U/F) 32G X 4 MM MISC 1 each by Does not apply route 4 (four) times daily., Starting 12/27/2015, Until Discontinued, Normal  magnesium oxide (MAG-OX) 400 (241.3 Mg) MG tablet Take 1 tablet (400 mg total) by mouth daily., Starting 11/10/2015, Until Discontinued, Print    Multiple Vitamins-Minerals (MULTIVITAMINS THER. W/MINERALS) TABS Take 1 tablet by mouth daily. For  energy, Until Discontinued, Historical Med    omeprazole (PRILOSEC) 20 MG capsule Take 1 capsule (20 mg total) by mouth daily., Starting 01/31/2015, Until Discontinued, Normal    ONETOUCH DELICA LANCETS 99991111 MISC Use to test glucose up to 6 times a day, Normal    oxyCODONE-acetaminophen (PERCOCET) 10-325 MG tablet Take 1 tablet by mouth every 4 (four) hours as needed for pain., Until Discontinued, Historical Med    potassium chloride SA (K-DUR,KLOR-CON) 20 MEQ tablet Take 1 tablet (20 mEq total) by mouth daily., Starting 11/10/2015, Until Discontinued, Print    promethazine (PHENERGAN) 25 MG tablet Take 25 mg by mouth every 4 (four) hours as needed. , Starting 10/29/2015, Until Discontinued, Historical Med    SUMAtriptan (IMITREX) 50 MG tablet TAKE 1 TABLET BY MOUTH ONCE. MAY REPEAT IN 2 HOURS IF HEADACHE PERSISTS OR RECURS., Normal    vitamin C (ASCORBIC ACID) 500 MG tablet Take 500 mg by mouth daily., Until Discontinued, Historical Med       Allergies  Allergen Reactions  . Shellfish Allergy Nausea And Vomiting  . Cefzil [Cefprozil] Rash  . Penicillins Rash    Has patient had a PCN reaction causing immediate rash, facial/tongue/throat swelling, SOB or lightheadedness with hypotension: NO Has patient had a PCN reaction causing severe rash involving mucus membranes or skin necrosis: NO Has patient had a PCN reaction that required hospitalization: NO Has patient had a PCN reaction occurring within the last 10 years: NO If all of the above answers are "NO", then may proceed with Cephalosporin use.   . Sulfa Antibiotics Rash    The results of significant diagnostics from this hospitalization (including imaging, microbiology, ancillary and laboratory) are listed below for reference.    Labs: Basic Metabolic Panel:  Recent Labs Lab 01/01/16 0046 01/02/16 0452  NA 138 138  K 3.4* 3.6  CL 108 106  CO2 20* 25  GLUCOSE 114* 94  BUN 10 5*  CREATININE 0.41* 0.38*  CALCIUM 9.8 9.0    Liver Function Tests:  Recent Labs Lab 01/01/16 0046 01/02/16 0452  AST 20 19  ALT 16 15  ALKPHOS 72 64  BILITOT 0.5 0.7  PROT 7.9 7.1  ALBUMIN 4.6 4.2    Recent Labs Lab 01/01/16 0046  LIPASE 13   CBC:  Recent Labs Lab 01/01/16 0046 01/02/16 0452  WBC 9.7 8.1  NEUTROABS 7.6  --   HGB 13.7 13.3  HCT 39.7 40.4  MCV 87.1 90.4  PLT 410* 392    CBG:  Recent Labs Lab 01/03/16 0725 01/03/16 1102 01/03/16 1608 01/03/16 2230 01/04/16 0733  GLUCAP 88 131* 83 146* 88    Principal Problem:   Intractable nausea and vomiting Active Problems:   Hereditary pancreatitis   Uncontrolled diabetes mellitus secondary to pancreatic insufficiency (HCC)   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 Sarajane Jews, MD. Electronically Signed: Delene Ruffini, Scribe.  01/04/2016 9:20am  I personally performed the services described in this documentation. All medical record entries made by the scribe were at  my direction. I have reviewed the chart and agree that the record reflects my personal performance and is accurate and complete. Murray Hodgkins, MD

## 2016-01-04 LAB — GLUCOSE, CAPILLARY: GLUCOSE-CAPILLARY: 88 mg/dL (ref 65–99)

## 2016-01-04 MED ORDER — INSULIN ASPART 100 UNIT/ML FLEXPEN: 15.0000 [IU] | PEN_INJECTOR | Freq: Three times a day (TID) | SUBCUTANEOUS | Status: DC

## 2016-01-04 NOTE — Progress Notes (Signed)
Discharge instructions read to patients and her family..They verbalized understanding of all instructions, Discharged to home with family

## 2016-01-04 NOTE — Progress Notes (Signed)
  PROGRESS NOTE  BRENDER HEWGLEY [id number] DOB: 07-Feb-1991 DOA: 01/01/2016 PCP: [name], MD  Brief Narrative: 25 year old woman past medical history chronic pancreatitis presented with complaints of frequent episodes of nausea, vomiting, and epigastric pain for several days. Admitted for intractable nausea, vomiting, abdominal pain, chronic pancreatitis. She has improved and will be discharged home later today.  Assessment/Plan: 1. Nausea, vomiting secondary to abdominal pain/hereditary pancreatitis with acute flare of chronic pain. Resolved. Lipase was normal on admission. Nausea improved, vomiting resolved, improved pain control. Tolerating solid diet well.  2. Abdominal pain secondary to chronic hereditary pancreatitis, with acute flare. Now stable. Continue Percocet for chronic abdominal pain. 3. Chronic pancreatitis, pancreatic insufficiency. Followed by endocrinology as an outpatient. 4. Diabetes mellitus type 1 uncontrolled. Secondary to pancreatic insufficiency. A1c 9.4. Blood sugars remain stable. 5. GERD. Continue PPI.   Appears improved overall. She is tolerating her diet well. Plan discharge home.  Murray Hodgkins, MD  [facility] Direct contact: [phone] --Via amion app [place]  --[email or web address]; password [email or web address]  7PM-7AM contact night coverage as above 01/04/2016, 6:52 AM  LOS: 2 days   Consultants:  none  Procedures:  none  Antimicrobials:  none  HPI/Subjective: Feels improved. Has been able to tolerate solid diet well. Denies any nausea. Pain has improved.   Objective: Filed Vitals:   01/03/16 1459 01/03/16 1944 01/03/16 2216 01/04/16 0629  BP: 126/81  131/91 117/89  Pulse: 68  81 76  Temp: 98.4 F (36.9 C)  98.7 F (37.1 C) 98.3 F (36.8 C)  TempSrc:   Oral Oral  Resp: 20  18 18   Height:      Weight:      SpO2: 100% 99% 100% 100%    Intake/Output Summary (Last 24 hours) at 01/04/16 0652 Last data filed at 01/03/16 1459  Gross per 24  hour  Intake 5731.66 ml  Output      0 ml  Net 5731.66 ml     Filed Weights   01/01/16 0446  Weight: 53.388 kg (117 lb 11.2 oz)    Exam: Constitutional:  . Appears calm and comfortable Respiratory:  . CTA bilaterally, no w/r/r.  . Respiratory effort normal. No retractions or accessory muscle use Cardiovascular:  . RRR, no m/r/g Abdomen:  . Abdomen soft Psychiatric:  . judgement and insight appear normal . Mental status o Mood, affect appropriate  I have personally reviewed following labs and imaging studies:  CBG 88, stable   Scheduled Meds: . enoxaparin (LOVENOX) injection  40 mg Subcutaneous Q24H  . insulin aspart  0-9 Units Subcutaneous TID WC  . insulin glargine  22 Units Subcutaneous QHS  . 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 Sarajane Jews, MD. Electronically Signed: Delene Ruffini, Scribe.  01/04/2016  10:00am  I personally performed the services described in this documentation. All medical record entries made by the scribe were at my direction. I have reviewed the chart and agree that the record reflects my personal performance and is accurate and complete. Murray Hodgkins, MD

## 2016-01-07 ENCOUNTER — Other Ambulatory Visit: Payer: Self-pay | Admitting: Family Medicine

## 2016-01-11 ENCOUNTER — Other Ambulatory Visit: Payer: Self-pay | Admitting: Family Medicine

## 2016-01-24 ENCOUNTER — Other Ambulatory Visit: Payer: Self-pay | Admitting: Family Medicine

## 2016-01-31 ENCOUNTER — Emergency Department (HOSPITAL_COMMUNITY)
Admission: EM | Admit: 2016-01-31 | Discharge: 2016-01-31 | Disposition: A | Discharge status: Home / Self Care | Payer: BC Managed Care – PPO | Attending: Emergency Medicine | Admitting: Emergency Medicine

## 2016-01-31 ENCOUNTER — Encounter (HOSPITAL_COMMUNITY): Payer: Self-pay | Admitting: Emergency Medicine

## 2016-01-31 DIAGNOSIS — E860 Dehydration: Secondary | ICD-10-CM | POA: Diagnosis not present

## 2016-01-31 DIAGNOSIS — Z794 Long term (current) use of insulin: Secondary | ICD-10-CM | POA: Insufficient documentation

## 2016-01-31 DIAGNOSIS — Z79899 Other long term (current) drug therapy: Secondary | ICD-10-CM | POA: Diagnosis not present

## 2016-01-31 DIAGNOSIS — R1013 Epigastric pain: Secondary | ICD-10-CM

## 2016-01-31 DIAGNOSIS — R109 Unspecified abdominal pain: Secondary | ICD-10-CM | POA: Diagnosis present

## 2016-01-31 DIAGNOSIS — E109 Type 1 diabetes mellitus without complications: Secondary | ICD-10-CM | POA: Diagnosis not present

## 2016-01-31 LAB — CBC WITH DIFFERENTIAL/PLATELET
Basophils Absolute: 0 10*3/uL (ref 0.0–0.1)
Basophils Relative: 0 %
Eosinophils Absolute: 0.1 10*3/uL (ref 0.0–0.7)
Eosinophils Relative: 1 %
HCT: 38.2 % (ref 36.0–46.0)
HEMOGLOBIN: 13 g/dL (ref 12.0–15.0)
LYMPHS ABS: 2.2 10*3/uL (ref 0.7–4.0)
LYMPHS PCT: 19 %
MCH: 30.2 pg (ref 26.0–34.0)
MCHC: 34 g/dL (ref 30.0–36.0)
MCV: 88.6 fL (ref 78.0–100.0)
Monocytes Absolute: 0.8 10*3/uL (ref 0.1–1.0)
Monocytes Relative: 6 %
NEUTROS PCT: 74 %
Neutro Abs: 8.8 10*3/uL — ABNORMAL HIGH (ref 1.7–7.7)
Platelets: 381 10*3/uL (ref 150–400)
RBC: 4.31 MIL/uL (ref 3.87–5.11)
RDW: 12.3 % (ref 11.5–15.5)
WBC: 11.9 10*3/uL — AB (ref 4.0–10.5)

## 2016-01-31 LAB — COMPREHENSIVE METABOLIC PANEL
ALK PHOS: 66 U/L (ref 38–126)
ALT: 15 U/L (ref 14–54)
ANION GAP: 11 (ref 5–15)
AST: 22 U/L (ref 15–41)
Albumin: 4.4 g/dL (ref 3.5–5.0)
BUN: 12 mg/dL (ref 6–20)
CO2: 19 mmol/L — AB (ref 22–32)
Calcium: 8.8 mg/dL — ABNORMAL LOW (ref 8.9–10.3)
Chloride: 102 mmol/L (ref 101–111)
Creatinine, Ser: 0.44 mg/dL (ref 0.44–1.00)
GFR calc Af Amer: >60 mL/min (ref >60)
GFR calc non Af Amer: >60 mL/min (ref >60)
GLUCOSE: 206 mg/dL — AB (ref 65–99)
POTASSIUM: 4 mmol/L (ref 3.5–5.1)
Sodium: 132 mmol/L — ABNORMAL LOW (ref 135–145)
Total Bilirubin: 0.8 mg/dL (ref 0.3–1.2)
Total Protein: 7.4 g/dL (ref 6.5–8.1)

## 2016-01-31 LAB — BLOOD GAS, VENOUS
Acid-base deficit: 2.3 mmol/L — ABNORMAL HIGH (ref 0.0–2.0)
BICARBONATE: 22.5 meq/L (ref 20.0–24.0)
O2 Saturation: 88 %
PCO2 VEN: 36.2 mmHg — AB (ref 45.0–50.0)
Patient temperature: 37
TCO2: 15.8 mmol/L (ref 0–100)
pH, Ven: 7.397 — ABNORMAL HIGH (ref 7.250–7.300)
pO2, Ven: 56.8 mmHg — ABNORMAL HIGH (ref 31.0–45.0)

## 2016-01-31 LAB — I-STAT BETA HCG BLOOD, ED (MC, WL, AP ONLY)

## 2016-01-31 LAB — ETHANOL: Alcohol, Ethyl (B): 9 mg/dL — ABNORMAL HIGH (ref <5)

## 2016-01-31 LAB — LIPASE, BLOOD: Lipase: <10 U/L — ABNORMAL LOW (ref 11–51)

## 2016-01-31 MED ORDER — SODIUM CHLORIDE 0.9 % IV BOLUS (SEPSIS)
1000.0000 mL | Freq: Once | INTRAVENOUS | Status: AC
  Administered 2016-01-31: 1000 mL via INTRAVENOUS

## 2016-01-31 MED ORDER — FENTANYL CITRATE (PF) 100 MCG/2ML IJ SOLN
25.0000 ug | INTRAMUSCULAR | Status: DC | PRN
Start: 2016-01-31 — End: 2016-01-31
  Administered 2016-01-31: 25 ug via INTRAVENOUS
  Filled 2016-01-31: qty 2

## 2016-01-31 MED ORDER — METOCLOPRAMIDE HCL 5 MG/ML IJ SOLN
10.0000 mg | Freq: Once | INTRAMUSCULAR | Status: AC
  Administered 2016-01-31: 10 mg via INTRAVENOUS
  Filled 2016-01-31: qty 2

## 2016-01-31 MED ORDER — FENTANYL CITRATE (PF) 100 MCG/2ML IJ SOLN
25.0000 ug | Freq: Once | INTRAMUSCULAR | Status: AC
  Administered 2016-01-31: 25 ug via INTRAVENOUS
  Filled 2016-01-31: qty 2

## 2016-01-31 MED ORDER — ONDANSETRON HCL 4 MG/2ML IJ SOLN: 4.0000 mg | Freq: Once | INTRAMUSCULAR | Status: DC

## 2016-01-31 NOTE — ED Provider Notes (Signed)
[facility] DEPT Provider Note   CSN: [id number] Arrival date & time: 01/31/16  1253  First Provider Contact:  First MD Initiated Contact with Patient 01/31/16 1304     By signing my name below, I, [name], attest that this documentation has been prepared under the direction and in the presence of [name], MD . Electronically Signed: [name], Scribe. 01/31/2016. 1:28 PM.  History   Chief Complaint Chief Complaint  Patient presents with  . Emesis   The history is provided by the patient. No language interpreter was used.   HPI Comments: Linda Walls is a 25 y.o. female with PMHx of Type I DM, DKA and hereditary pancreatitis, who presents to the Emergency Department complaining of gradually worsening, constant, central abdominal pain and multiple episodes of vomiting that began 2 days ago and worsened this morning. She states associated episode of syncope that occurred this morning while at work. She denies fever and confusion. Pt reports she was hospitalized for pancreatitis at [facility] on 01/01/16.  Past Medical History:  Diagnosis Date  . DKA (diabetic ketoacidoses) ([place])   . DM type 1 (diabetes mellitus, type 1) ([place])   . GERD (gastroesophageal reflux disease)   . Hereditary pancreatitis   . Hernia   . Jaundice    Patient Active Problem List   Diagnosis Date Noted  . Pancreatitis 01/02/2016  . Uncontrolled diabetes mellitus secondary to pancreatic insufficiency ([place]) 06/06/2015  . 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 Pancreatic insufficiency ([place]) 05/23/2015  . Vitamin D deficiency 05/23/2015  . DKA (diabetic ketoacidoses) ([place]) 05/06/2015  . Intractable nausea and vomiting 04/16/2015  . Abdominal pain, left lower quadrant 04/16/2015  . Leukocytosis 04/16/2015  . Migraine headache 10/27/2014  . Abdominal pain, chronic, epigastric 08/15/2013  . Pyelonephritis 03/06/2013  . Acute gastritis 12/23/2012  . Nausea and vomiting 05/01/2012  .  Hypoglycemia associated with diabetes ([place]) 05/01/2012  . GERD (gastroesophageal reflux disease) 04/29/2012  . Anemia 05/19/2011  . Hereditary pancreatitis 05/18/2011  . Hyponatremia 05/18/2011  . Hypokalemia 05/18/2011  . Sinus tachycardia ([place]) 05/18/2011  . Dehydration 05/18/2011  . Thrombocytosis ([place]) 05/18/2011   Past Surgical History:  Procedure Laterality Date  . BILE DUCT STENT PLACEMENT  2006 at [place]     As infant.  Marland Kitchen PANCREATIC CYST DRAINAGE  at age 57 year   OB History    No data available     Home Medications    Prior to Admission medications   Medication Sig Start Date End Date Taking? Authorizing Provider  CREON 24000 units CPEP Take 1 capsule by mouth 3 (three) times daily with meals. 10/30/15   Historical Provider, MD  Cyanocobalamin (B-12 PO) Take 1 tablet by mouth daily.    Historical Provider, MD  ferrous sulfate 325 (65 FE) MG tablet Take 325 mg by mouth daily with breakfast.    Historical Provider, MD  GLUCAGON EMERGENCY 1 MG injection Inject 1 mg as directed once. 03/13/15   Historical Provider, MD  hyoscyamine (LEVSIN SL) 0.125 MG SL tablet PLACE 1 TABLET UNDER THE TONGUE EVERY 4 HOURS AS NEEDED FOR PAIN. 09/17/15   [name], MD  hyoscyamine (LEVSIN, ANASPAZ) 0.125 MG tablet PLACE 1 TABLET UNDER THE TONGUE EVERY 4 HOURS AS NEEDED FOR PAIN. 01/07/16   [name], MD  insulin aspart (NOVOLOG) 100 UNIT/ML FlexPen Inject 15-21 Units into the skin 3 (three) times daily with meals. 01/04/16   [name], MD  Insulin  Degludec (TRESIBA FLEXTOUCH [place]) Inject 24 Units into the skin at bedtime.    Historical Provider, MD  Insulin Pen Needle (BD PEN NEEDLE NANO U/F) 32G X 4 MM MISC 1 each by Does not apply route 4 (four) times daily. 12/27/15   [name], MD  magnesium oxide (MAG-OX) 400 (241.3 Mg) MG tablet Take 1 tablet (400 mg total) by mouth daily. 11/10/15   Orvan Falconer, MD  Multiple Vitamins-Minerals (MULTIVITAMINS THER.  W/MINERALS) TABS Take 1 tablet by mouth daily. For energy    Historical Provider, MD  omeprazole (PRILOSEC) 20 MG capsule Take 1 capsule (20 mg total) by mouth daily. Patient taking differently: Take 20 mg by mouth daily as needed (Acid Reflux).  01/31/15   [name], MD  [facility] DELICA LANCETS [id number] MISC Use to test glucose up to 6 times a day 12/27/15   [name], MD  oxyCODONE-acetaminophen (PERCOCET) 10-325 MG tablet Take 1 tablet by mouth every 4 (four) hours as needed for pain.    Historical Provider, MD  potassium chloride SA (K-DUR,KLOR-CON) 20 MEQ tablet Take 1 tablet (20 mEq total) by mouth daily. 11/10/15   Orvan Falconer, MD  promethazine (PHENERGAN) 25 MG tablet TAKE 1 TABLET BY MOUTH EVERY 6 HOURS AS NEEDED FOR NAUSEA. 01/11/16   [name], MD  SUMAtriptan (IMITREX) 50 MG tablet TAKE 1 TABLET BY MOUTH ONCE. MAY REPEAT IN 2 HOURS IF HEADACHE PERSISTS OR RECURS. 01/24/16   [name], MD  vitamin C (ASCORBIC ACID) 500 MG tablet Take 500 mg by mouth daily.    Historical Provider, MD   Family History Family History  Problem Relation Age of Onset  . Pancreatitis Father 30    hereditary  . Hypertension Father   . GER disease Father   . Hypothyroidism Mother   . Breast cancer Maternal Aunt   . Congenital heart disease Maternal Grandfather   . Prostate cancer Maternal Grandfather   . Alzheimer's disease Maternal Grandfather    Social History Social History  Substance Use Topics  . Smoking status: Never Smoker  . Smokeless tobacco: Never Used  . Alcohol use No   Allergies   Shellfish allergy; Cefzil [cefprozil]; Penicillins; and Sulfa antibiotics   Review of Systems Review of Systems  Constitutional: Negative for fever.  Gastrointestinal: Positive for abdominal pain and vomiting.  Psychiatric/Behavioral: Negative for confusion.   Physical Exam Updated Vital Signs BP 98/69 (BP Location: Left Arm)   Pulse 71   Temp 97.8 F (36.6 C) (Oral)   Resp 18    Ht 5\' 2"  (1.575 m)   Wt 115 lb (52.2 kg)   LMP 01/28/2016 (Exact Date)   SpO2 100%   BMI 21.03 kg/m   Physical Exam  Constitutional: She is oriented to person, place, and time. She appears well-developed and well-nourished.  HENT:  Head: Normocephalic and atraumatic.  Eyes: Conjunctivae are normal. Pupils are equal, round, and reactive to light. Right eye exhibits no discharge. Left eye exhibits no discharge. No scleral icterus.  Neck: Normal range of motion. No JVD present. No tracheal deviation present.  Pulmonary/Chest: Effort normal. No stridor.  Neurological: She is alert and oriented to person, place, and time. Coordination normal.  Psychiatric: She has a normal mood and affect. Her behavior is normal. Judgment and thought content normal.  Nursing note and vitals reviewed.  ED Treatments / Results  Labs (all labs ordered are listed, but only abnormal results are displayed) Labs Reviewed  COMPREHENSIVE METABOLIC  PANEL  ETHANOL  LIPASE, BLOOD  CBC WITH DIFFERENTIAL/PLATELET  URINALYSIS, ROUTINE W REFLEX MICROSCOPIC (NOT AT [facility])  I-STAT BETA HCG BLOOD, ED ([facility], [facility], [facility] ONLY)   EKG  EKG Interpretation None      Radiology No results found.  Procedures Procedures  DIAGNOSTIC STUDIES:  Oxygen Saturation is 100% on RA, normal by my interpretation.    COORDINATION OF CARE:  1:27 PM Discussed treatment plan with pt at bedside and pt agreed to plan.  Medications Ordered in ED Medications  sodium chloride 0.9 % bolus 1,000 mL (not administered)  metoCLOPramide (REGLAN) injection 10 mg (not administered)   Initial Impression / Assessment and Plan / ED Course  I have reviewed the triage vital signs and the nursing notes.  Pertinent labs & imaging results that were available during my care of the patient were reviewed by me and considered in my medical decision making (see chart for details).  Clinical Course  Comment By Time  Patient w no emesis, feeling  better. Initial labs reassuring [name], MD 08/03 1502   3:58 PM Follow 1.5 L fluid resuscitation, the patient feels better. However, she continues to have some nausea, some pain. I discussed all findings again with patient and her family member. With largely reassuring labs, the patient will receive one additional liter of fluid resuscitation, additional anti-emetics, analgesia as needed. If she remains well, she will follow up with her physician on Monday.  I personally performed the services described in this documentation, which was scribed in my presence. The recorded information has been reviewed and is accurate.   Final Clinical Impressions(s) / ED Diagnoses  This young female with history of insulin-dependent diabetes, recurrent pericarditis presents with abdominal pain, nausea, vomiting. Here the patient is tachycardic, but afebrile, awake and alert. Patient has epigastric discomfort. With her history, DKA, pancreatitis or immediate considerations. Neither of these is demonstrated via labs.  Patient required substantial fluid resuscitation, but had resolution of symptoms.     [name], MD 01/31/16 1600

## 2016-01-31 NOTE — ED Notes (Signed)
Patient attempted urine sample at this time. Patient was unable to get one.

## 2016-01-31 NOTE — ED Notes (Signed)
Pt unable to provide UA at this time.  Aware one is needed.  Resting with family at bedside.  States nausea much better.

## 2016-01-31 NOTE — ED Notes (Signed)
Pt states she feels much better and would like to go home.  Has had no further vomiting since being medicated.

## 2016-01-31 NOTE — ED Triage Notes (Signed)
Pt reports onset of emesis yesterday.

## 2016-02-05 ENCOUNTER — Other Ambulatory Visit: Payer: Self-pay | Admitting: Family Medicine

## 2016-02-20 ENCOUNTER — Ambulatory Visit (INDEPENDENT_AMBULATORY_CARE_PROVIDER_SITE_OTHER): Payer: BC Managed Care – PPO | Admitting: Family Medicine

## 2016-02-20 ENCOUNTER — Encounter: Payer: Self-pay | Admitting: Family Medicine

## 2016-02-20 VITALS — BP 112/78 | Ht 62.0 in | Wt 118.2 lb

## 2016-02-20 DIAGNOSIS — G43709 Chronic migraine without aura, not intractable, without status migrainosus: Secondary | ICD-10-CM | POA: Diagnosis not present

## 2016-02-20 DIAGNOSIS — K219 Gastro-esophageal reflux disease without esophagitis: Secondary | ICD-10-CM

## 2016-02-20 DIAGNOSIS — E109 Type 1 diabetes mellitus without complications: Secondary | ICD-10-CM | POA: Diagnosis not present

## 2016-02-20 DIAGNOSIS — K859 Acute pancreatitis without necrosis or infection, unspecified: Secondary | ICD-10-CM

## 2016-02-20 MED ORDER — PROMETHAZINE HCL 25 MG PO TABS: 25.0000 mg | ORAL_TABLET | Freq: Four times a day (QID) | ORAL | 2 refills | Status: DC | PRN

## 2016-02-20 MED ORDER — CREON 24000-76000 UNITS PO CPEP: 1.0000 | ORAL_CAPSULE | Freq: Three times a day (TID) | ORAL | 11 refills | Status: DC

## 2016-02-20 MED ORDER — OXYCODONE-ACETAMINOPHEN 10-325 MG PO TABS: 1.0000 | ORAL_TABLET | ORAL | 0 refills | Status: DC | PRN

## 2016-02-20 MED ORDER — SUMATRIPTAN SUCCINATE 50 MG PO TABS: ORAL_TABLET | ORAL | 2 refills | Status: DC

## 2016-02-20 NOTE — Progress Notes (Signed)
   Subjective:    Patient ID: Linda Walls, female    DOB: 01-16-1991, 25 y.o.   MRN: [id number]  HPI  Patient arrives to follow up on recent pancreatitis.   Patient needs refill on percocet , perc use varies, every other dy as needed,  Sometimes when the pain hitsIts right upper quadrant other times Upper mid epigastric  Functional every other yr assessment of u s pancreas not due for eigten months next  Now on pancreas enzymes supplement and rate of 24,000 units per meal. Needs refill. Would like prescribed.  Continues to work with endocrinologist on diabetes.  Last hospitalization last emergency room visit reviewed  Review of Systems No headache, no major weight loss or weight gain, no chest pain no back pain abdominal pain no change in bowel habits complete ROS otherwise negative     Objective:   Physical Exam  Alert vital stable lungs clear heart rare rhythm mild epigastric tenderness no rebound no guarding      Assessment & Plan:  Impression chronic pain with chronic pancreatitis. Uses oxycodone intermittently. Family helps monitor #2 pancreatic insufficiency discussed with need for meds discussed #3 type 1 diabetes equivalent with ongoing challenges discussed plan 25 minutes spent most in discussion. Pain medicine prescribed. Pancreas and sign meds prescribed. Still using nausea medicine with Imitrex intermittently for migraines with reasonable good control of symptoms.

## 2016-03-04 ENCOUNTER — Other Ambulatory Visit: Payer: Self-pay | Admitting: "Endocrinology

## 2016-03-06 ENCOUNTER — Ambulatory Visit: Payer: BC Managed Care – PPO | Admitting: Family Medicine

## 2016-03-12 ENCOUNTER — Other Ambulatory Visit: Payer: Self-pay | Admitting: Family Medicine

## 2016-03-26 ENCOUNTER — Other Ambulatory Visit: Payer: Self-pay | Admitting: "Endocrinology

## 2016-03-26 LAB — COMPLETE METABOLIC PANEL WITH GFR
ALT: 12 U/L (ref 6–29)
AST: 16 U/L (ref 10–30)
Albumin: 4.5 g/dL (ref 3.6–5.1)
Alkaline Phosphatase: 67 U/L (ref 33–115)
BUN: 13 mg/dL (ref 7–25)
CALCIUM: 9.8 mg/dL (ref 8.6–10.2)
CHLORIDE: 102 mmol/L (ref 98–110)
CO2: 25 mmol/L (ref 20–31)
Creat: 0.56 mg/dL (ref 0.50–1.10)
GFR, Est African American: >89 mL/min (ref >=60)
GFR, Est Non African American: >89 mL/min (ref >=60)
Glucose, Bld: 75 mg/dL (ref 65–99)
POTASSIUM: 3.5 mmol/L (ref 3.5–5.3)
Sodium: 139 mmol/L (ref 135–146)
Total Bilirubin: 0.5 mg/dL (ref 0.2–1.2)
Total Protein: 7.2 g/dL (ref 6.1–8.1)

## 2016-03-26 LAB — VITAMIN B12: VITAMIN B 12: 1369 pg/mL — AB (ref 200–1100)

## 2016-03-27 LAB — HEMOGLOBIN A1C
HEMOGLOBIN A1C: 8.9 % — AB (ref <5.7)
Mean Plasma Glucose: 209 mg/dL

## 2016-03-27 LAB — VITAMIN D 25 HYDROXY (VIT D DEFICIENCY, FRACTURES): VIT D 25 HYDROXY: 27 ng/mL — AB (ref 30–100)

## 2016-03-31 ENCOUNTER — Other Ambulatory Visit: Payer: Self-pay | Admitting: Family Medicine

## 2016-04-03 ENCOUNTER — Ambulatory Visit (INDEPENDENT_AMBULATORY_CARE_PROVIDER_SITE_OTHER): Payer: BC Managed Care – PPO | Admitting: "Endocrinology

## 2016-04-03 ENCOUNTER — Encounter: Payer: Self-pay | Admitting: "Endocrinology

## 2016-04-03 VITALS — BP 109/67 | HR 97 | Ht 62.0 in | Wt 117.0 lb

## 2016-04-03 DIAGNOSIS — E559 Vitamin D deficiency, unspecified: Secondary | ICD-10-CM | POA: Diagnosis not present

## 2016-04-03 DIAGNOSIS — K8689 Other specified diseases of pancreas: Secondary | ICD-10-CM

## 2016-04-03 DIAGNOSIS — E0865 Diabetes mellitus due to underlying condition with hyperglycemia: Secondary | ICD-10-CM | POA: Diagnosis not present

## 2016-04-03 DIAGNOSIS — IMO0002 Reserved for concepts with insufficient information to code with codable children: Secondary | ICD-10-CM

## 2016-04-03 MED ORDER — CREON 24000-76000 UNITS PO CPEP: ORAL_CAPSULE | ORAL | 2 refills | Status: DC

## 2016-04-03 MED ORDER — VITAMIN D3 125 MCG (5000 UT) PO CAPS: 5000.0000 [IU] | ORAL_CAPSULE | Freq: Every day | ORAL | 0 refills | Status: DC

## 2016-04-03 MED ORDER — GLUCAGON EMERGENCY 1 MG IJ KIT: 1.0000 mg | PACK | Freq: Once | INTRAMUSCULAR | 99 refills | Status: DC | PRN

## 2016-04-03 NOTE — Progress Notes (Signed)
Subjective:    Patient ID: Linda Walls, female    DOB: [birth year], PCP [name], MD   Past Medical History:  Diagnosis Date  . DKA (diabetic ketoacidoses) ([place])   . DM type 1 (diabetes mellitus, type 1) ([place])   . GERD (gastroesophageal reflux disease)   . Hereditary pancreatitis   . Hernia   . Jaundice    Past Surgical History:  Procedure Laterality Date  . BILE DUCT STENT PLACEMENT  2006 at [facility]     As infant.  Marland Kitchen PANCREATIC CYST DRAINAGE  at age 45 year   Social History   Social History  . Marital status: Single    Spouse name: N/A  . Number of children: N/A  . Years of education: N/A   Social History Main Topics  . Smoking status: Never Smoker  . Smokeless tobacco: Never Used  . Alcohol use No  . Drug use: No  . Sexual activity: Yes    Birth control/ protection: None   Other Topics Concern  . None   Social History Narrative  . None   Outpatient Encounter Prescriptions as of 04/03/2016  Medication Sig  . Cholecalciferol (VITAMIN D3) 5000 units CAPS Take 1 capsule (5,000 Units total) by mouth daily.  Marland Kitchen CREON 24000-76000 units CPEP Use 2 caps with meals and 1 capsule with snacks upto 9 capsules a day  . Cyanocobalamin (B-12 PO) Take 1 tablet by mouth daily.  . ferrous sulfate 325 (65 FE) MG tablet Take 325 mg by mouth daily with breakfast.  . GLUCAGON EMERGENCY 1 MG injection Inject 1 mg into the muscle once as needed.  . hyoscyamine (LEVSIN SL) 0.125 MG SL tablet PLACE 1 TABLET UNDER THE TONGUE EVERY 4 HOURS AS NEEDED FOR PAIN.  Marland Kitchen insulin aspart (NOVOLOG) 100 UNIT/ML FlexPen Inject 15-21 Units into the skin 3 (three) times daily with meals.  . Insulin Pen Needle (BD PEN NEEDLE NANO U/F) 32G X 4 MM MISC 1 each by Does not apply route 4 (four) times daily.  . magnesium oxide (MAG-OX) 400 (241.3 Mg) MG tablet Take 1 tablet (400 mg total) by mouth daily.  . Multiple Vitamins-Minerals (MULTIVITAMINS THER. W/MINERALS) TABS Take 1  tablet by mouth daily. For energy  . omeprazole (PRILOSEC) 20 MG capsule TAKE ONE CAPSULE BY MOUTH DAILY AS NEEDED FOR ACID REFLUX.  Marland Kitchen ONETOUCH DELICA LANCETS 32T MISC Use to test glucose up to 6 times a day  . oxyCODONE-acetaminophen (PERCOCET) 10-325 MG tablet Take 1 tablet by mouth every 4 (four) hours as needed for pain.  . potassium chloride SA (K-DUR,KLOR-CON) 20 MEQ tablet Take 1 tablet (20 mEq total) by mouth daily.  . promethazine (PHENERGAN) 25 MG tablet Take 1 tablet (25 mg total) by mouth every 6 (six) hours as needed. for nausea  . SUMAtriptan (IMITREX) 50 MG tablet TAKE 1 TABLET BY MOUTH ONCE. MAY REPEAT IN 2 HOURS IF HEADACHE PERSISTS OR RECURS.  . TRESIBA FLEXTOUCH 100 UNIT/ML SOPN INJECT 30 UNITS SUBCUTANEOUSLY AT BEDTIME.  . vitamin C (ASCORBIC ACID) 500 MG tablet Take 500 mg by mouth daily.  . [DISCONTINUED] CREON 24000 units CPEP Take 1 capsule (24,000 Units total) by mouth 3 (three) times daily with meals.  . [DISCONTINUED] GLUCAGON EMERGENCY 1 MG injection Inject 1 mg as directed once.  . [DISCONTINUED] hyoscyamine (LEVSIN, ANASPAZ) 0.125 MG tablet PLACE 1 TABLET UNDER THE TONGUE EVERY 4 HOURS AS NEEDED FOR PAIN. (Patient not taking: Reported on 01/31/2016)  No facility-administered encounter medications on file as of 04/03/2016.    ALLERGIES: Allergies  Allergen Reactions  . Shellfish Allergy Nausea And Vomiting  . Cefzil [Cefprozil] Rash  . Penicillins Rash    Has patient had a PCN reaction causing immediate rash, facial/tongue/throat swelling, SOB or lightheadedness with hypotension: NO Has patient had a PCN reaction causing severe rash involving mucus membranes or skin necrosis: NO Has patient had a PCN reaction that required hospitalization: NO Has patient had a PCN reaction occurring within the last 10 years: NO If all of the above answers are "NO", then may proceed with Cephalosporin use.   . Sulfa Antibiotics Rash   VACCINATION STATUS: Immunization History   Administered Date(s) Administered  . DTaP 06/19/1993, 08/20/1993, 10/16/1993, 04/28/1994, 12/04/1998  . Hepatitis B 06/19/1993, 10/16/1993  . IPV 06/19/1993, 08/20/1993, 10/16/1993, 12/04/1998  . Influenza Split 05/19/2011, 05/01/2012  . Influenza,inj,Quad PF,36+ Mos 03/08/2013, 02/23/2015  . MMR 04/28/1994, 12/04/1998  . Pneumococcal Polysaccharide-23 05/19/2011, 10/16/2014  . 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  She came with Significantly better blood glucose profile. -No interval hospitalizations. - Her  Recent A1c has improved to From 10.4%. - She did not have interval hospitalizations.   -She is now back using DEXCOM continuous glucose monitoring device.  She is on Toujeo 24 units and Novolog 15 units TIDAC. She has some random hyperglycemia which responded to her interventions with appropriate insulin amount. She did have 3 hypoglycemic episodes in to 60s over the last 2 weeks. she also remains on Creon 24000 ( 1 caps TIDAC , 1 caps per snacks).  She did not gain any weight since last visit. she has regular menstrual cycles. Marland Kitchen He feels much better  since her discharge. -She is up-to-date on ophthalmology and dental care. -She is status [facility].  -Review of Systems  Constitutional: Positive for fatigue. Negative for unexpected weight change.  HENT: Negative for trouble swallowing and voice change.   Eyes: Negative for visual disturbance.  Respiratory: Negative for cough, shortness of breath and wheezing.   Cardiovascular: Negative for chest pain, palpitations and leg swelling.  Gastrointestinal: Negative for diarrhea, nausea and vomiting.  Endocrine: Positive for polydipsia and polyuria. Negative for cold intolerance, heat intolerance and polyphagia.  Musculoskeletal: Negative for arthralgias and myalgias.  Skin: Negative for color change, pallor, rash and wound.   Neurological: Negative for seizures and headaches.  Psychiatric/Behavioral: Negative for confusion and suicidal ideas.    Objective:    BP 109/67   Pulse 97   Ht _0  (1.575 m)   Wt 117 lb (53.1 kg)   BMI 21.40 kg/m   Wt Readings from Last 3 Encounters:  04/03/16 117 lb (53.1 kg)  02/20/16 118 lb 3.2 oz (53.6 kg)  01/31/16 115 lb (52.2 kg)    Physical Exam  Constitutional: She is oriented to person, place, and time. She appears well-developed.  HENT:  Head: Normocephalic and atraumatic.  Eyes: EOM are normal.  Neck: Normal range of motion. Neck supple. No tracheal deviation present. No thyromegaly present.  Cardiovascular: Normal rate and regular rhythm.   Pulmonary/Chest: Effort normal and breath sounds normal.  Abdominal: Soft. Bowel sounds are normal. There is no tenderness. There is no guarding.  Musculoskeletal: Normal range of motion. She exhibits no edema.  Neurological: She is alert and oriented to person, place, and time. She has normal reflexes. No cranial nerve  deficit. Coordination normal.  Skin: Skin is warm and dry. No rash noted. No erythema. No pallor.  Psychiatric: She has a normal mood and affect. Judgment normal.    Complete Blood Count (Most recent): Lab Results  Component Value Date   WBC 11.9 (H) 01/31/2016   HGB 13.0 01/31/2016   HCT 38.2 01/31/2016   MCV 88.6 01/31/2016   PLT 381 01/31/2016   Chemistry (most recent): Lab Results  Component Value Date   NA 139 03/26/2016   K 3.5 03/26/2016   CL 102 03/26/2016   CO2 25 03/26/2016   BUN 13 03/26/2016   CREATININE 0.56 03/26/2016   Diabetic Labs (most recent): Lab Results  Component Value Date   HGBA1C 8.9 (H) 03/26/2016   HGBA1C 9.4 (H) 01/01/2016   HGBA1C 10.4 (H) 12/06/2015    1) uncontrolled pancreatic diabetes 2) pancreatic insufficiency 3) vitamin D deficiency   Assessment & Plan:   1. Uncontrolled type 1 diabetes mellitus with complication [facility])  She Did have an episode  of diabetic acidosis since last visit, is no longer on DEXCOM due to coverage issues .  -She remains at a high risk for more acute and chronic complications of diabetes which include CAD, CVA, CKD, retinopathy, and neuropathy. These are all discussed in detail with the patient. -Her  recent A1c is  better at 8.9% from 10.4% . - Glucose logs and insulin administration records pertaining to this visit,  to be scanned into patient's records.  Recent labs reviewed.  - Suggestion is made for patient to avoid simple carbohydrates   from their diet including Cakes , Desserts, Ice Cream,  Soda (  diet and regular) , Sweet Tea , Candies,  Chips, Cookies, Artificial Sweeteners,   and "Sugar-free" Products .  This will help patient to have stable blood glucose profile and potentially avoid unintended  Weight gain.  - Patient is advised to stick to a routine mealtimes to eat 3 meals  a day and avoid unnecessary snacks ( to snack only to correct hypoglycemia).  - I have approached patient with the following individualized plan to manage diabetes and patient agrees.  - I will continue  increase Tresiba to 30 units qhs, and continue  Novolog  15 units TIDAC for premeal BG readings of 90-133m/dl, plus patient specific sliding scale insulin for correction of unexpected hyperglycemia above 1526mdl, associated with strict monitoring of BG AC and HS.  -  I allowed her to cover meals with 5 units of NovoLog after a meal if her pre-meal blood glucose is between 70 and 90 2 g/dL.  I urged her to allow more  carbs , specially complex carbs, to prevent un-planned weight loss. -She is a vegetarian, gave her more plant-based protein sources to diversify her protein intake.   -Adjustment parameters for hypo and hyperglycemia were given in a written document to patient. -Patient is encouraged to call clinic for blood glucose levels less than 70 or above 300 mg /dl.C plus SSI.  Her diabetes is due to yet unidentified  hereditary pancreatitis. She is administratively classified as type 1 diabetes,  will be treated as a typical type 1 DM. -She is using DEXCOM. -She is not a candidate for metformin,SGLT2 inhibitors, and incretin therapy. -Recent thyroid function test where normal , her prior AM cortisol is normal at 18. - Patient specific target  for A1c; LDL, HDL, Triglycerides, and  Waist Circumference were discussed in detail. -Her ophthalm eval was negative, and due for another one.  2) Pancreatic Insufficiency: -she is advised to  increase Creon to 2 capsules of 24,000 units per meal and 1 tab per snack. Marland Kitchen 3) vitamin D deficiency: She is status post therapy with vitamin D 50,000 units weekly.  - Her vitamin D is 27. I advised her to start vitamin D3 5000 units daily for the next 90 days.   4) Chronic Care/Health Maintenance:  -Patient  Is encouraged to continue to follow up with Ophthalmology, Podiatrist at least yearly or according to recommendations, and advised to stay away from smoking. I have recommended yearly flu vaccine and pneumonia vaccination at least every 5 years; moderate intensity exercise for up to 150 minutes weekly; and  sleep for at least 7 hours a day. - I will obtain vitamin B12 and vitamin D before her next visit.  - 25 minutes of time was spent on the care of this patient , 50% of which was applied for counseling on diabetes complications and their preventions. - Her labs show vitamin  B12 above target at 1300, I advised her to avoid vitamin B12 supplement for now.   - I advised patient to maintain close follow up with Dr. [name]  for primary care needs.   Patient is asked to bring meter and  blood glucose logs during their next visit.   Follow up plan: -No Follow-up on file.  Glade Lloyd, MD Phone: [phone]  Fax: [phone]   04/03/2016, 9:03 AM

## 2016-04-15 ENCOUNTER — Ambulatory Visit (INDEPENDENT_AMBULATORY_CARE_PROVIDER_SITE_OTHER): Payer: BC Managed Care – PPO | Admitting: Family Medicine

## 2016-04-15 ENCOUNTER — Encounter: Payer: Self-pay | Admitting: Family Medicine

## 2016-04-15 VITALS — BP 122/74 | Ht 62.0 in | Wt 118.0 lb

## 2016-04-15 DIAGNOSIS — K859 Acute pancreatitis without necrosis or infection, unspecified: Secondary | ICD-10-CM | POA: Diagnosis not present

## 2016-04-15 DIAGNOSIS — R1013 Epigastric pain: Secondary | ICD-10-CM

## 2016-04-15 DIAGNOSIS — Z23 Encounter for immunization: Secondary | ICD-10-CM

## 2016-04-15 DIAGNOSIS — Z79891 Long term (current) use of opiate analgesic: Secondary | ICD-10-CM | POA: Diagnosis not present

## 2016-04-15 DIAGNOSIS — G8929 Other chronic pain: Secondary | ICD-10-CM

## 2016-04-15 MED ORDER — OXYCODONE-ACETAMINOPHEN 10-325 MG PO TABS: ORAL_TABLET | ORAL | 0 refills | Status: DC

## 2016-04-15 MED ORDER — SUMATRIPTAN SUCCINATE 50 MG PO TABS: ORAL_TABLET | ORAL | 11 refills | Status: DC

## 2016-04-15 MED ORDER — OXYCODONE-ACETAMINOPHEN 10-325 MG PO TABS: 1.0000 | ORAL_TABLET | ORAL | 0 refills | Status: DC | PRN

## 2016-04-15 NOTE — Progress Notes (Signed)
   Subjective:    Patient ID: Linda Walls, female    DOB: 1991/03/26, 25 y.o.   MRN: [id number]  HPI This patient was seen today for chronic pain  The medication list was reviewed and updated.   -Compliance with medication: yes  - Number patient states they take daily: takes one half to one a day  -when was the last dose patient took? yesterday  The patient was advised the importance of maintaining medication and not using illegal substances with these.  Refills needed: yes  The patient was educated that we can provide 3 monthly scripts for their medication, it is their responsibility to follow the instructions.  Side effects or complications from medications: none  Patient is aware that pain medications are meant to minimize the severity of the pain to allow their pain levels to improve to allow for better function. They are aware of that pain medications cannot totally remove their pain.  Due for UDT ( at least once per year) : due today.   Needs refill on imitrex also today.    Full time at the [employer]   Review of Systems No headache, no major weight loss or weight gain, no chest pain no back pain abdominal pain no change in bowel habits complete ROS otherwise negative     Objective:   Physical Exam  Alert vitals stable, NAD. Blood pressure good on repeat. HEENT normal. Lungs clear. Heart regular rate and rhythm. 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 Patient is now requiring pain medicine virtually every day. Generally takes half of the oxycodone 10/325 twice per day. This achieves fairly good control. Patient also continues to struggle with type 1 diabetes and fairly brittle in nature, also discussed. Plan change to monthly narcotic prescription. Oxycodone 10/325 #30 one half twice a day when necessary 3 months worth. Recheck in 3 months [name] 25 minutes spent  most in discussing process clarify need for daily meds

## 2016-04-21 LAB — TOXASSURE SELECT 13 (MW), URINE

## 2016-05-28 ENCOUNTER — Other Ambulatory Visit: Payer: Self-pay | Admitting: Family Medicine

## 2016-06-30 HISTORY — PX: ABDOMINAL SURGERY: SHX537

## 2016-07-01 ENCOUNTER — Encounter (HOSPITAL_COMMUNITY): Payer: Self-pay | Admitting: Emergency Medicine

## 2016-07-01 ENCOUNTER — Inpatient Hospital Stay (HOSPITAL_COMMUNITY): Payer: BC Managed Care – PPO

## 2016-07-01 ENCOUNTER — Inpatient Hospital Stay (HOSPITAL_COMMUNITY)
Admission: EM | Admit: 2016-07-01 | Discharge: 2016-07-03 | DRG: 638 | Disposition: A | Discharge status: Home / Self Care | Payer: BC Managed Care – PPO | Attending: Internal Medicine | Admitting: Internal Medicine

## 2016-07-01 DIAGNOSIS — K861 Other chronic pancreatitis: Secondary | ICD-10-CM | POA: Diagnosis present

## 2016-07-01 DIAGNOSIS — Z880 Allergy status to penicillin: Secondary | ICD-10-CM

## 2016-07-01 DIAGNOSIS — R110 Nausea: Secondary | ICD-10-CM | POA: Diagnosis not present

## 2016-07-01 DIAGNOSIS — E1010 Type 1 diabetes mellitus with ketoacidosis without coma: Principal | ICD-10-CM | POA: Diagnosis present

## 2016-07-01 DIAGNOSIS — Z882 Allergy status to sulfonamides status: Secondary | ICD-10-CM | POA: Diagnosis not present

## 2016-07-01 DIAGNOSIS — E1110 Type 2 diabetes mellitus with ketoacidosis without coma: Secondary | ICD-10-CM | POA: Diagnosis present

## 2016-07-01 DIAGNOSIS — Z8249 Family history of ischemic heart disease and other diseases of the circulatory system: Secondary | ICD-10-CM | POA: Diagnosis not present

## 2016-07-01 DIAGNOSIS — R1032 Left lower quadrant pain: Secondary | ICD-10-CM | POA: Diagnosis not present

## 2016-07-01 DIAGNOSIS — Z820 Family history of epilepsy and other diseases of the nervous system: Secondary | ICD-10-CM

## 2016-07-01 DIAGNOSIS — K859 Acute pancreatitis without necrosis or infection, unspecified: Secondary | ICD-10-CM | POA: Diagnosis not present

## 2016-07-01 DIAGNOSIS — K219 Gastro-esophageal reflux disease without esophagitis: Secondary | ICD-10-CM | POA: Diagnosis present

## 2016-07-01 DIAGNOSIS — D72829 Elevated white blood cell count, unspecified: Secondary | ICD-10-CM | POA: Diagnosis present

## 2016-07-01 DIAGNOSIS — Z794 Long term (current) use of insulin: Secondary | ICD-10-CM | POA: Diagnosis not present

## 2016-07-01 DIAGNOSIS — E876 Hypokalemia: Secondary | ICD-10-CM | POA: Diagnosis present

## 2016-07-01 DIAGNOSIS — Z91013 Allergy to seafood: Secondary | ICD-10-CM | POA: Diagnosis not present

## 2016-07-01 DIAGNOSIS — R112 Nausea with vomiting, unspecified: Secondary | ICD-10-CM | POA: Diagnosis not present

## 2016-07-01 DIAGNOSIS — Z803 Family history of malignant neoplasm of breast: Secondary | ICD-10-CM

## 2016-07-01 HISTORY — DX: Migraine, unspecified, not intractable, without status migrainosus: G43.909

## 2016-07-01 HISTORY — DX: Irritable bowel syndrome, unspecified: K58.9

## 2016-07-01 LAB — COMPREHENSIVE METABOLIC PANEL
ALBUMIN: 5 g/dL (ref 3.5–5.0)
ALT: 18 U/L (ref 14–54)
ANION GAP: 22 — AB (ref 5–15)
AST: 29 U/L (ref 15–41)
Alkaline Phosphatase: 113 U/L (ref 38–126)
BUN: 17 mg/dL (ref 6–20)
CHLORIDE: 101 mmol/L (ref 101–111)
CO2: 14 mmol/L — AB (ref 22–32)
Calcium: 9.7 mg/dL (ref 8.9–10.3)
Creatinine, Ser: 1.02 mg/dL — ABNORMAL HIGH (ref 0.44–1.00)
GFR calc Af Amer: >60 mL/min (ref >60)
GFR calc non Af Amer: >60 mL/min (ref >60)
GLUCOSE: 528 mg/dL — AB (ref 65–99)
POTASSIUM: 4.8 mmol/L (ref 3.5–5.1)
SODIUM: 137 mmol/L (ref 135–145)
TOTAL PROTEIN: 8.8 g/dL — AB (ref 6.5–8.1)
Total Bilirubin: 1.5 mg/dL — ABNORMAL HIGH (ref 0.3–1.2)

## 2016-07-01 LAB — CBC WITH DIFFERENTIAL/PLATELET
BASOS ABS: 0 10*3/uL (ref 0.0–0.1)
BASOS PCT: 0 %
EOS ABS: 0 10*3/uL (ref 0.0–0.7)
Eosinophils Relative: 0 %
HCT: 43 % (ref 36.0–46.0)
Hemoglobin: 14.1 g/dL (ref 12.0–15.0)
Lymphocytes Relative: 7 %
Lymphs Abs: 1.6 10*3/uL (ref 0.7–4.0)
MCH: 29.3 pg (ref 26.0–34.0)
MCHC: 32.8 g/dL (ref 30.0–36.0)
MCV: 89.4 fL (ref 78.0–100.0)
MONO ABS: 0.5 10*3/uL (ref 0.1–1.0)
MONOS PCT: 3 %
NEUTROS ABS: 19.1 10*3/uL — AB (ref 1.7–7.7)
NEUTROS PCT: 90 %
Platelets: 529 10*3/uL — ABNORMAL HIGH (ref 150–400)
RBC: 4.81 MIL/uL (ref 3.87–5.11)
RDW: 12.3 % (ref 11.5–15.5)
WBC: 21.2 10*3/uL — ABNORMAL HIGH (ref 4.0–10.5)

## 2016-07-01 LAB — BASIC METABOLIC PANEL
ANION GAP: 12 (ref 5–15)
ANION GAP: 7 (ref 5–15)
BUN: 13 mg/dL (ref 6–20)
BUN: 9 mg/dL (ref 6–20)
CALCIUM: 8.4 mg/dL — AB (ref 8.9–10.3)
CO2: 13 mmol/L — ABNORMAL LOW (ref 22–32)
CO2: 18 mmol/L — ABNORMAL LOW (ref 22–32)
CREATININE: 0.52 mg/dL (ref 0.44–1.00)
Calcium: 8.2 mg/dL — ABNORMAL LOW (ref 8.9–10.3)
Chloride: 112 mmol/L — ABNORMAL HIGH (ref 101–111)
Chloride: 112 mmol/L — ABNORMAL HIGH (ref 101–111)
Creatinine, Ser: 0.64 mg/dL (ref 0.44–1.00)
GFR calc Af Amer: >60 mL/min (ref >60)
GFR calc Af Amer: >60 mL/min (ref >60)
GLUCOSE: 247 mg/dL — AB (ref 65–99)
GLUCOSE: 125 mg/dL — AB (ref 65–99)
Potassium: 4.5 mmol/L (ref 3.5–5.1)
Potassium: 4.2 mmol/L (ref 3.5–5.1)
Sodium: 137 mmol/L (ref 135–145)
Sodium: 137 mmol/L (ref 135–145)

## 2016-07-01 LAB — CBG MONITORING, ED
GLUCOSE-CAPILLARY: 181 mg/dL — AB (ref 65–99)
GLUCOSE-CAPILLARY: 112 mg/dL — AB (ref 65–99)
GLUCOSE-CAPILLARY: 106 mg/dL — AB (ref 65–99)
GLUCOSE-CAPILLARY: 89 mg/dL (ref 65–99)
GLUCOSE-CAPILLARY: 282 mg/dL — AB (ref 65–99)
Glucose-Capillary: 276 mg/dL — ABNORMAL HIGH (ref 65–99)
Glucose-Capillary: 243 mg/dL — ABNORMAL HIGH (ref 65–99)
Glucose-Capillary: 190 mg/dL — ABNORMAL HIGH (ref 65–99)
Glucose-Capillary: 126 mg/dL — ABNORMAL HIGH (ref 65–99)

## 2016-07-01 LAB — URINALYSIS, ROUTINE W REFLEX MICROSCOPIC
Bilirubin Urine: NEGATIVE
Glucose, UA: >=500 mg/dL — AB
Hgb urine dipstick: NEGATIVE
LEUKOCYTES UA: NEGATIVE
NITRITE: NEGATIVE
PH: 5.5 (ref 5.0–8.0)
Protein, ur: NEGATIVE mg/dL
Specific Gravity, Urine: 1.02 (ref 1.005–1.030)

## 2016-07-01 LAB — MAGNESIUM: Magnesium: 1.9 mg/dL (ref 1.7–2.4)

## 2016-07-01 LAB — URINALYSIS, MICROSCOPIC (REFLEX)
Bacteria, UA: NONE SEEN
WBC UA: NONE SEEN WBC/hpf (ref 0–5)

## 2016-07-01 LAB — GLUCOSE, CAPILLARY
GLUCOSE-CAPILLARY: 266 mg/dL — AB (ref 65–99)
GLUCOSE-CAPILLARY: 268 mg/dL — AB (ref 65–99)
GLUCOSE-CAPILLARY: 170 mg/dL — AB (ref 65–99)
Glucose-Capillary: 261 mg/dL — ABNORMAL HIGH (ref 65–99)
Glucose-Capillary: 230 mg/dL — ABNORMAL HIGH (ref 65–99)

## 2016-07-01 LAB — PREGNANCY, URINE: Preg Test, Ur: NEGATIVE

## 2016-07-01 LAB — LIPASE, BLOOD: Lipase: 15 U/L (ref 11–51)

## 2016-07-01 LAB — MRSA PCR SCREENING: MRSA BY PCR: NEGATIVE

## 2016-07-01 MED ORDER — INSULIN GLARGINE 100 UNIT/ML ~~LOC~~ SOLN
30.0000 [IU] | Freq: Every day | SUBCUTANEOUS | Status: DC
  Filled 2016-07-01: qty 0.3

## 2016-07-01 MED ORDER — OXYCODONE-ACETAMINOPHEN 5-325 MG PO TABS
1.0000 | ORAL_TABLET | Freq: Four times a day (QID) | ORAL | Status: DC | PRN
  Administered 2016-07-01 – 2016-07-03 (×9): 1 via ORAL
  Filled 2016-07-01 (×9): qty 1

## 2016-07-01 MED ORDER — SODIUM CHLORIDE 0.9 % IV SOLN: INTRAVENOUS | Status: DC

## 2016-07-01 MED ORDER — DEXTROSE-NACL 5-0.45 % IV SOLN: INTRAVENOUS | Status: DC

## 2016-07-01 MED ORDER — INSULIN ASPART 100 UNIT/ML ~~LOC~~ SOLN: 10.0000 [IU] | Freq: Three times a day (TID) | SUBCUTANEOUS | Status: DC

## 2016-07-01 MED ORDER — ONDANSETRON HCL 4 MG/2ML IJ SOLN
INTRAMUSCULAR | Status: AC
  Administered 2016-07-01: 4 mg
  Filled 2016-07-01: qty 2

## 2016-07-01 MED ORDER — INSULIN DEGLUDEC 100 UNIT/ML ~~LOC~~ SOPN: 30.0000 [IU] | PEN_INJECTOR | Freq: Every day | SUBCUTANEOUS | Status: DC

## 2016-07-01 MED ORDER — PANCRELIPASE (LIP-PROT-AMYL) 12000-38000 UNITS PO CPEP
48000.0000 [IU] | ORAL_CAPSULE | Freq: Three times a day (TID) | ORAL | Status: DC
  Administered 2016-07-01 – 2016-07-03 (×7): 48000 [IU] via ORAL
  Filled 2016-07-01 (×10): qty 4

## 2016-07-01 MED ORDER — ENOXAPARIN SODIUM 40 MG/0.4ML ~~LOC~~ SOLN
40.0000 mg | SUBCUTANEOUS | Status: DC
  Administered 2016-07-01 – 2016-07-03 (×3): 40 mg via SUBCUTANEOUS
  Filled 2016-07-01 (×3): qty 0.4

## 2016-07-01 MED ORDER — SODIUM CHLORIDE 0.9 % IV SOLN
INTRAVENOUS | Status: DC
  Administered 2016-07-01: 3.7 [IU]/h via INTRAVENOUS
  Filled 2016-07-01: qty 2.5

## 2016-07-01 MED ORDER — SODIUM CHLORIDE 0.9 % IV BOLUS (SEPSIS)
2000.0000 mL | Freq: Once | INTRAVENOUS | Status: AC
  Administered 2016-07-01: 2000 mL via INTRAVENOUS

## 2016-07-01 MED ORDER — INSULIN ASPART 100 UNIT/ML ~~LOC~~ SOLN
10.0000 [IU] | Freq: Once | SUBCUTANEOUS | Status: AC
  Administered 2016-07-01: 10 [IU] via INTRAVENOUS
  Filled 2016-07-01: qty 1

## 2016-07-01 MED ORDER — INSULIN GLARGINE 100 UNIT/ML ~~LOC~~ SOLN
10.0000 [IU] | Freq: Once | SUBCUTANEOUS | Status: AC
  Administered 2016-07-01: 10 [IU] via SUBCUTANEOUS
  Filled 2016-07-01: qty 0.1

## 2016-07-01 MED ORDER — MORPHINE SULFATE (PF) 4 MG/ML IV SOLN
4.0000 mg | Freq: Once | INTRAVENOUS | Status: AC
  Administered 2016-07-01: 4 mg via INTRAVENOUS

## 2016-07-01 MED ORDER — SODIUM CHLORIDE 0.9 % IV SOLN: INTRAVENOUS | Status: AC

## 2016-07-01 MED ORDER — SODIUM CHLORIDE 0.9 % IV BOLUS (SEPSIS)
1000.0000 mL | Freq: Once | INTRAVENOUS | Status: AC
  Administered 2016-07-01: 1000 mL via INTRAVENOUS

## 2016-07-01 MED ORDER — INSULIN ASPART 100 UNIT/ML ~~LOC~~ SOLN: 15.0000 [IU] | Freq: Three times a day (TID) | SUBCUTANEOUS | Status: DC

## 2016-07-01 MED ORDER — OXYCODONE-ACETAMINOPHEN 5-325 MG PO TABS
1.0000 | ORAL_TABLET | ORAL | Status: DC | PRN
  Administered 2016-07-01: 1 via ORAL
  Filled 2016-07-01: qty 1

## 2016-07-01 MED ORDER — OXYCODONE HCL 5 MG PO TABS: 10.0000 mg | ORAL_TABLET | ORAL | Status: DC | PRN

## 2016-07-01 MED ORDER — PANTOPRAZOLE SODIUM 40 MG PO TBEC
40.0000 mg | DELAYED_RELEASE_TABLET | Freq: Every day | ORAL | Status: DC
  Administered 2016-07-01 – 2016-07-03 (×3): 40 mg via ORAL
  Filled 2016-07-01 (×3): qty 1

## 2016-07-01 MED ORDER — ONDANSETRON HCL 4 MG/2ML IJ SOLN
4.0000 mg | Freq: Once | INTRAMUSCULAR | Status: AC
  Administered 2016-07-01: 4 mg via INTRAVENOUS
  Filled 2016-07-01: qty 2

## 2016-07-01 MED ORDER — SODIUM CHLORIDE 0.9 % IV SOLN
INTRAVENOUS | Status: DC
  Administered 2016-07-01: 2.2 [IU]/h via INTRAVENOUS
  Filled 2016-07-01: qty 2.5

## 2016-07-01 MED ORDER — POTASSIUM CHLORIDE 20 MEQ/15ML (10%) PO SOLN
20.0000 meq | Freq: Once | ORAL | Status: AC
  Administered 2016-07-01: 20 meq via ORAL
  Filled 2016-07-01: qty 30

## 2016-07-01 MED ORDER — PANCRELIPASE (LIP-PROT-AMYL) 12000-38000 UNITS PO CPEP
24000.0000 [IU] | ORAL_CAPSULE | ORAL | Status: DC | PRN
  Filled 2016-07-01: qty 2

## 2016-07-01 MED ORDER — HYOSCYAMINE SULFATE 0.125 MG PO TABS
0.1250 mg | ORAL_TABLET | ORAL | Status: DC | PRN
  Filled 2016-07-01: qty 1

## 2016-07-01 MED ORDER — ONDANSETRON HCL 4 MG/2ML IJ SOLN
4.0000 mg | Freq: Four times a day (QID) | INTRAMUSCULAR | Status: DC | PRN
  Administered 2016-07-01 – 2016-07-03 (×9): 4 mg via INTRAVENOUS
  Filled 2016-07-01 (×9): qty 2

## 2016-07-01 MED ORDER — PANCRELIPASE (LIP-PROT-AMYL) 12000-38000 UNITS PO CPEP: 24000.0000 [IU] | ORAL_CAPSULE | ORAL | Status: DC

## 2016-07-01 MED ORDER — VITAMIN C 500 MG PO TABS
500.0000 mg | ORAL_TABLET | Freq: Every day | ORAL | Status: DC
  Administered 2016-07-01 – 2016-07-03 (×3): 500 mg via ORAL
  Filled 2016-07-01 (×4): qty 1

## 2016-07-01 MED ORDER — FERROUS SULFATE 325 (65 FE) MG PO TABS
325.0000 mg | ORAL_TABLET | Freq: Every day | ORAL | Status: DC
  Administered 2016-07-01 – 2016-07-03 (×3): 325 mg via ORAL
  Filled 2016-07-01 (×4): qty 1

## 2016-07-01 MED ORDER — INSULIN GLARGINE 100 UNIT/ML ~~LOC~~ SOLN
20.0000 [IU] | Freq: Once | SUBCUTANEOUS | Status: AC
  Administered 2016-07-01: 20 [IU] via SUBCUTANEOUS
  Filled 2016-07-01: qty 0.2

## 2016-07-01 MED ORDER — INSULIN ASPART 100 UNIT/ML ~~LOC~~ SOLN: 0.0000 [IU] | Freq: Every day | SUBCUTANEOUS | Status: DC

## 2016-07-01 MED ORDER — INSULIN GLARGINE 100 UNIT/ML ~~LOC~~ SOLN
35.0000 [IU] | Freq: Every day | SUBCUTANEOUS | Status: DC
  Administered 2016-07-02: 35 [IU] via SUBCUTANEOUS
  Filled 2016-07-01 (×2): qty 0.35

## 2016-07-01 MED ORDER — DEXTROSE-NACL 5-0.45 % IV SOLN
INTRAVENOUS | Status: DC
  Administered 2016-07-01: 05:00:00 via INTRAVENOUS

## 2016-07-01 MED ORDER — SODIUM CHLORIDE 0.9 % IV SOLN
INTRAVENOUS | Status: AC
  Filled 2016-07-01: qty 2.5

## 2016-07-01 MED ORDER — PROMETHAZINE HCL 25 MG/ML IJ SOLN: 25.0000 mg | Freq: Four times a day (QID) | INTRAMUSCULAR | Status: DC | PRN

## 2016-07-01 MED ORDER — INSULIN ASPART 100 UNIT/ML ~~LOC~~ SOLN
0.0000 [IU] | Freq: Three times a day (TID) | SUBCUTANEOUS | Status: DC
  Administered 2016-07-01: 8 [IU] via SUBCUTANEOUS
  Administered 2016-07-02: 3 [IU] via SUBCUTANEOUS
  Administered 2016-07-02: 5 [IU] via SUBCUTANEOUS
  Administered 2016-07-03: 3 [IU] via SUBCUTANEOUS

## 2016-07-01 MED ORDER — MORPHINE SULFATE (PF) 2 MG/ML IV SOLN
INTRAVENOUS | Status: AC
  Administered 2016-07-01: 4 mg via INTRAVENOUS
  Filled 2016-07-01: qty 2

## 2016-07-01 NOTE — Progress Notes (Signed)
Inpatient Diabetes Program Recommendations  AACE/ADA: New Consensus Statement on Inpatient Glycemic Control (2015)  Target Ranges:  Prepandial:   less than 140 mg/dL      Peak postprandial:   less than 180 mg/dL (1-2 hours)      Critically ill patients:  140 - 180 mg/dL   Results for [name], [name] (MRN [id number]) as of 07/01/2016 11:19  Ref. Range 07/01/2016 02:20 07/01/2016 04:23 07/01/2016 05:28 07/01/2016 06:34 07/01/2016 07:34 07/01/2016 08:34 07/01/2016 09:42 07/01/2016 11:17  Glucose-Capillary Latest Ref Range: 65 - 99 mg/dL >600 (HH) 276 (H) 243 (H) 190 (H) 181 (H) 126 (H) 112 (H) 106 (H)  Results for [name], [name] (MRN [id number]) as of 07/01/2016 11:19  Ref. Range 07/01/2016 09:49  CO2 Latest Ref Range: 22 - 32 mmol/L 18 (L)  Results for [name], [name] (MRN [id number]) as of 07/01/2016 11:19  Ref. Range 07/01/2016 09:49  Anion gap Latest Ref Range: 5 - 15  7   Review of Glycemic Control  Diabetes history: DM1 Outpatient Diabetes medications: Tresiba 30 units QHS, Novolog 15-21 units TID with meals (for meal coverage and correction) Current orders for Inpatient glycemic control: Novolin R insulin drip, Tresiba 30 units QHS, Novolog 10 units TID with meals  Inpatient Diabetes Program Recommendations: Insulin - Basal: Patient has DM1 and is sensitive to insulin and Tresiba insulin is not on formulary. Recommend ordering Lantus 20 units Q24H (based on 49 kg x 0.4 units) at time of transition from IV to SQ insulin. Correction (SSI): At time of transition from IV to SQ insulin, please consider ordering Novolog 0-9 units TID with meals and Novolog 0-5 units QHS for correction. Insulin - Meal Coverage: At time of transition from IV to SQ insulin, please consider ordering Novolog 4 units TID with meals for meal coverage if patient eats at least 50% of meals.  Thanks, Barnie Alderman, RN, MSN, CDE Diabetes Coordinator Inpatient Diabetes Program [phone] (Team Pager from 8am to 5pm)

## 2016-07-01 NOTE — Assessment & Plan Note (Signed)
Due to DKA. Plan: When necessary Zofran and Phenergan.

## 2016-07-01 NOTE — Assessment & Plan Note (Signed)
Likely reactive. Doubt infection. Plan: Monitor for signs of infection. Repeat CBC tomorrow.

## 2016-07-01 NOTE — ED Provider Notes (Signed)
[facility] DEPT Provider Note   CSN: [id number] Arrival date & time: 07/01/16  0208     History   Chief Complaint Chief Complaint  Patient presents with  . Emesis    HPI Linda Walls is a 26 y.o. female.  HPI  This is a 26 year old female with a history of diabetes, hereditary pancreatitis who reports with a three-day history of abdominal pain, nausea, vomiting. Patient reports pain is similar to prior episodes of pancreatitis. She states that she has not been able to obtain a blood sugar reading in several days as "it was too high." She reports that she has been administering insulin and attempt to get her blood sugars down. Last insulin administration was 3 hours ago. She reports 7 out of 10 up her abdominal pain with nonbilious, nonbloody emesis. Denies diarrhea. Pain is described as sharp. Denies fever or infectious symptoms.   Past Medical History:  Diagnosis Date  . DKA (diabetic ketoacidoses) ([place])   . DM type 1 (diabetes mellitus, type 1) ([place])   . GERD (gastroesophageal reflux disease)   . Hereditary pancreatitis   . Hernia   . Jaundice     Patient Active Problem List   Diagnosis Date Noted  . Pancreatitis 01/02/2016  . Uncontrolled diabetes mellitus secondary to pancreatic insufficiency ([place]) 06/06/2015  . 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 Pancreatic insufficiency 05/23/2015  . Vitamin D deficiency 05/23/2015  . DKA (diabetic ketoacidoses) ([place]) 05/06/2015  . Intractable nausea and vomiting 04/16/2015  . Abdominal pain, left lower quadrant 04/16/2015  . Leukocytosis 04/16/2015  . Migraine headache 10/27/2014  . Abdominal pain, chronic, epigastric 08/15/2013  . Pyelonephritis 03/06/2013  . Acute gastritis 12/23/2012  . Nausea and vomiting 05/01/2012  . Hypoglycemia associated with diabetes ([employer]) 05/01/2012  . GERD (gastroesophageal reflux disease) 04/29/2012  . Anemia 05/19/2011  . Hereditary pancreatitis 05/18/2011  . Hyponatremia  05/18/2011  . Hypokalemia 05/18/2011  . Sinus tachycardia ([place]) 05/18/2011  . Dehydration 05/18/2011  . Thrombocytosis ([place]) 05/18/2011    Past Surgical History:  Procedure Laterality Date  . BILE DUCT STENT PLACEMENT  2006 at [facility]     As infant.  Marland Kitchen PANCREATIC CYST DRAINAGE  at age 38 year    OB History    No data available       Home Medications    Prior to Admission medications   Medication Sig Start Date End Date Taking? Authorizing Provider  Cholecalciferol (VITAMIN D3) 5000 units CAPS Take 1 capsule (5,000 Units total) by mouth daily. 04/03/16  Yes [name], MD  CREON [phone] units CPEP Use 2 caps with meals and 1 capsule with snacks upto 9 capsules a day 04/03/16  Yes [name], MD  ferrous sulfate 325 (65 FE) MG tablet Take 325 mg by mouth daily with breakfast.   Yes Historical Provider, MD  GLUCAGON EMERGENCY 1 MG injection Inject 1 mg into the muscle once as needed. 04/03/16  Yes [name], MD  hyoscyamine (LEVSIN SL) 0.125 MG SL tablet PLACE 1 TABLET UNDER THE TONGUE EVERY 4 HOURS AS NEEDED FOR PAIN. 09/17/15  Yes [name], MD  hyoscyamine (LEVSIN, ANASPAZ) 0.125 MG tablet PLACE 1 TABLET UNDER THE TONGUE EVERY 4 HOURS AS NEEDED FOR PAIN. 05/28/16  Yes [name], MD  insulin aspart (NOVOLOG) 100 UNIT/ML FlexPen Inject 15-21 Units into the skin 3 (three) times daily with meals. 01/04/16  Yes [name], MD  Insulin Pen Needle (BD  PEN NEEDLE NANO U/F) 32G X 4 MM MISC 1 each by Does not apply route 4 (four) times daily. 12/27/15  Yes [name], MD  omeprazole (PRILOSEC) 20 MG capsule TAKE ONE CAPSULE BY MOUTH DAILY AS NEEDED FOR ACID REFLUX. 05/28/16  Yes [name], MD  [facility] DELICA LANCETS [id number] MISC Use to test glucose up to 6 times a day 12/27/15  Yes [name], MD  oxyCODONE-acetaminophen (PERCOCET) 10-325 MG tablet TAKE ONE HALF TABLET BID PRN PAIN 04/15/16  Yes [name], MD  promethazine (PHENERGAN) 25 MG tablet Take 1 tablet (25 mg total) by mouth every 6 (six) hours as needed. for nausea 02/20/16  Yes [name], MD  SUMAtriptan (IMITREX) 50 MG tablet TAKE 1 TABLET BY MOUTH ONCE. MAY REPEAT IN 2 HOURS IF HEADACHE PERSISTS OR RECURS. 04/15/16  Yes [name], MD  TRESIBA FLEXTOUCH 100 UNIT/ML SOPN INJECT 30 UNITS SUBCUTANEOUSLY AT BEDTIME. 03/04/16  Yes [name], MD  vitamin C (ASCORBIC ACID) 500 MG tablet Take 500 mg by mouth daily.   Yes Historical Provider, MD    Family History Family History  Problem Relation Age of Onset  . Pancreatitis Father 30    hereditary  . Hypertension Father   . GER disease Father   . Hypothyroidism Mother   . Breast cancer Maternal Aunt   . Congenital heart disease Maternal Grandfather   . Prostate cancer Maternal Grandfather   . Alzheimer's disease Maternal Grandfather     Social History Social History  Substance Use Topics  . Smoking status: Never Smoker  . Smokeless tobacco: Never Used  . Alcohol use No     Allergies   Shellfish allergy; Cefzil [cefprozil]; Penicillins; and Sulfa antibiotics   Review of Systems Review of Systems  Constitutional: Negative for fever.  Respiratory: Negative for shortness of breath.   Cardiovascular: Negative for chest pain.  Gastrointestinal: Positive for abdominal pain, nausea and vomiting. Negative for diarrhea.  Genitourinary: Negative for dysuria.  All other systems reviewed and are negative.    Physical Exam Updated Vital Signs BP 118/85   Pulse 104   Temp 98.3 F (36.8 C)   Resp 20   Ht 5\' 2"  (1.575 m)   Wt 110 lb (49.9 kg)   LMP 06/10/2016   SpO2 99%   BMI 20.12 kg/m   Physical Exam  Constitutional: She is oriented to person, place, and time. No distress.  Ill-appearing but nontoxic, no acute distress  HENT:  Head: Normocephalic and atraumatic.  Mucous membranes dry  Cardiovascular: Normal rate, regular rhythm and  normal heart sounds.   No murmur heard. Pulmonary/Chest: Effort normal and breath sounds normal. No respiratory distress. She has no wheezes.  Abdominal: Soft. Bowel sounds are normal. She exhibits no mass. There is tenderness. There is no guarding.  Epigastric tenderness to palpation without rebound or guarding  Neurological: She is alert and oriented to person, place, and time.  Skin: Skin is warm and dry.  Psychiatric: She has a normal mood and affect.  Nursing note and vitals reviewed.    ED Treatments / Results  Labs (all labs ordered are listed, but only abnormal results are displayed) Labs Reviewed  CBC WITH DIFFERENTIAL/PLATELET - Abnormal; Notable for the following:       Result Value   WBC 21.2 (*)    Platelets 529 (*)    Neutro Abs 19.1 (*)    All other components within normal limits  COMPREHENSIVE  METABOLIC PANEL - Abnormal; Notable for the following:    CO2 14 (*)    Glucose, Bld 528 (*)    Creatinine, Ser 1.02 (*)    Total Protein 8.8 (*)    Total Bilirubin 1.5 (*)    Anion gap 22 (*)    All other components within normal limits  CBG MONITORING, ED - Abnormal; Notable for the following:    Glucose-Capillary >600 (*)    All other components within normal limits  LIPASE, BLOOD  URINALYSIS, ROUTINE W REFLEX MICROSCOPIC  PREGNANCY, URINE    EKG  EKG Interpretation None       Radiology No results found.  Procedures Procedures (including critical care time)  CRITICAL CARE Performed by: Merryl Hacker   Total critical care time: 30 minutes  Critical care time was exclusive of separately billable procedures and treating other patients.  Critical care was necessary to treat or prevent imminent or life-threatening deterioration.  Critical care was time spent personally by me on the following activities: development of treatment plan with patient and/or surrogate as well as nursing, discussions with consultants, evaluation of patient's response to  treatment, examination of patient, obtaining history from patient or surrogate, ordering and performing treatments and interventions, ordering and review of laboratory studies, ordering and review of radiographic studies, pulse oximetry and re-evaluation of patient's condition.   Medications Ordered in ED Medications  dextrose 5 %-0.45 % sodium chloride infusion (not administered)  insulin regular (NOVOLIN R,HUMULIN R) 250 Units in sodium chloride 0.9 % 250 mL (1 Units/mL) infusion (not administered)  sodium chloride 0.9 % bolus 1,000 mL (0 mLs Intravenous Stopped 07/01/16 0347)  morphine 4 MG/ML injection 4 mg (4 mg Intravenous Given 07/01/16 0236)  ondansetron (ZOFRAN) injection 4 mg (4 mg Intravenous Given 07/01/16 0233)  insulin aspart (novoLOG) injection 10 Units (10 Units Intravenous Given 07/01/16 0232)  morphine 2 MG/ML injection (4 mg Intravenous Given 07/01/16 0233)  sodium chloride 0.9 % bolus 2,000 mL (2,000 mLs Intravenous New Bag/Given 07/01/16 0348)     Initial Impression / Assessment and Plan / ED Course  I have reviewed the triage vital signs and the nursing notes.  Pertinent labs & imaging results that were available during my care of the patient were reviewed by me and considered in my medical decision making (see chart for details).  Clinical Course    Patient presents with nausea, vomiting, abdominal pain and hyperglycemia. History of DKA and are reviewed. Pancreatitis. Ill-appearing but nontoxic. Tender without signs of peritonitis. Mildly tachycardic but otherwise vital signs are reassuring. Initial blood sugar greater than 600. Patient was ordered a total of 3 L of fluid. She was also given 10 units of insulin. Initial lab workup with white count of 21,000. Glucose 528 with a bicarbonate of 14 and an anion gap of 22. Patient placed on glucose stabilizer. Urinalysis and urine pregnancy pending. Denies infectious symptoms. White count may just be reactive. Lipase is normal. Do not  feel she needs imaging at this time. Will admit to hospitalist.  Primary care physician Dr. [name].  Final Clinical Impressions(s) / ED Diagnoses   Final diagnoses:  Diabetic ketoacidosis without coma associated with type 1 diabetes mellitus ([place])    New Prescriptions New Prescriptions   No medications on file     Merryl Hacker, MD 07/01/16 [phone]

## 2016-07-01 NOTE — Assessment & Plan Note (Signed)
Severe. Has an elevated anion gap. Plan: Admit to [facility] unit.  Order DKA protocol. Insulin IV gtt.  Measure basic metabolic panel and other labs per schedule.  Replace potassium per protocol.

## 2016-07-01 NOTE — Assessment & Plan Note (Signed)
Likely related to her DKA. She states this is the area where she normally has pain when she gets DKA. Plan: Treatment DKA. When necessary hydrocodone.

## 2016-07-01 NOTE — Assessment & Plan Note (Signed)
Chronic issue.  Plan: Continue usual care.

## 2016-07-01 NOTE — H&P (Signed)
History and Physical    Patient:  Linda Walls [id number] January 01, 1991   Date of Admission:  07/01/2016  PRIMARY CARE PROVIDER (PCP): [name], MD   Outpatient Specialists: Dr. 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 She reports her FSBS have read > 600 on glucometer for the last 2 days. She started having intermittent abdominal pain and vomiting taking about 4 days ago, and both gradually worsened. Onset: Blood sugar elevations 2 days. Abdominal symptoms 4 days. Duration: intermittent. Timing: Recurrent. Severity: Severe elevation of blood sugars. Moderate abdominal pain. Context: Patient had been helping someone move and was trying to complete her tasks before dealing with her blood sugars.  Location: Abdominal pain location is the left lower quadrant, which is where she usually has pain when she has DKA.  Radiation: None.  Character/Quality: Up to 8 out of 10 at times. Sharp.  Alleviated by: Nothing. Exacerbated by: Nothing. Associated Symptoms: Vomiting about 7 or 8 times over the last 24 hours. Nausea. Minimal diarrhea. No bloody stool or constipation. Mild dizziness. Fatigue. No fever or chills.  Treatments: none at home except usual medications.   ED Course: The patient was found to be in DKA and was started on DKA protocol.   Past Medical History:  Diagnosis Date  . DKA (diabetic ketoacidoses) ([place])   . DM type 1 (diabetes mellitus, type 1) ([place])   . GERD (gastroesophageal reflux disease)   . Hereditary pancreatitis   . Hernia   . IBS (irritable bowel syndrome)   . Jaundice   . Migraines     Past Surgical History:  Procedure Laterality Date  . BILE DUCT STENT PLACEMENT  2006 at [place] 6 months after it was placed.  Marland Kitchen HERNIA REPAIR     As infant.  Marland Kitchen  PANCREATIC CYST DRAINAGE  at age 16 year    Allergies  Allergen Reactions  . Shellfish Allergy Nausea And Vomiting  . Cefzil [Cefprozil] Rash  . Penicillins Rash    Has patient had a PCN reaction causing immediate rash, facial/tongue/throat swelling, SOB or lightheadedness with hypotension: NO Has patient had a PCN reaction causing severe rash involving mucus membranes or skin necrosis: NO Has patient had a PCN reaction that required hospitalization: NO Has patient had a PCN reaction occurring within the last 10 years: NO If all of the above answers are "NO", then may proceed with Cephalosporin use.   . Sulfa Antibiotics Rash    No current facility-administered medications on file prior to encounter.    Current Outpatient Prescriptions on File Prior to Encounter  Medication Sig Dispense Refill  . Cholecalciferol (VITAMIN D3) 5000 units CAPS Take 1 capsule (5,000 Units total) by mouth daily. 90 capsule 0  . CREON 24000-76000 units CPEP Use 2 caps with meals and 1 capsule with snacks upto 9 capsules a day 270 capsule 2  . ferrous sulfate 325 (65 FE) MG tablet Take 325 mg by mouth daily with breakfast.    . GLUCAGON EMERGENCY 1 MG injection Inject 1 mg into the muscle once as needed. 1 each PRN  . hyoscyamine (LEVSIN SL) 0.125 MG SL tablet PLACE 1 TABLET UNDER THE TONGUE EVERY 4 HOURS AS NEEDED FOR PAIN. 30 tablet 0  . hyoscyamine (LEVSIN, ANASPAZ) 0.125 MG tablet PLACE 1 TABLET  UNDER THE TONGUE EVERY 4 HOURS AS NEEDED FOR PAIN. 30 tablet 0  . insulin aspart (NOVOLOG) 100 UNIT/ML FlexPen Inject 15-21 Units into the skin 3 (three) times daily with meals.    . Insulin Pen Needle (BD PEN NEEDLE NANO U/F) 32G X 4 MM MISC 1 each by Does not apply route 4 (four) times daily. 150 each 5  . omeprazole (PRILOSEC) 20 MG capsule TAKE ONE CAPSULE BY MOUTH DAILY AS NEEDED FOR ACID REFLUX. 30 capsule 0  . ONETOUCH DELICA LANCETS 99991111 MISC Use to test glucose up to 6 times a day 180 each 4  .  oxyCODONE-acetaminophen (PERCOCET) 10-325 MG tablet TAKE ONE HALF TABLET BID PRN PAIN 30 tablet 0  . promethazine (PHENERGAN) 25 MG tablet Take 1 tablet (25 mg total) by mouth every 6 (six) hours as needed. for nausea 30 tablet 2  . SUMAtriptan (IMITREX) 50 MG tablet TAKE 1 TABLET BY MOUTH ONCE. MAY REPEAT IN 2 HOURS IF HEADACHE PERSISTS OR RECURS. 6 tablet 11  . TRESIBA FLEXTOUCH 100 UNIT/ML SOPN INJECT 30 UNITS SUBCUTANEOUSLY AT BEDTIME. 15 mL 2  . vitamin C (ASCORBIC ACID) 500 MG tablet Take 500 mg by mouth daily.       Social History   Social History  . Marital status: Single    Spouse name: N/A  . Number of children: N/A  . Years of education: N/A   Occupational History  . Not on file.   Social History Main Topics  . Smoking status: Never Smoker  . Smokeless tobacco: Never Used  . Alcohol use No  . Drug use: No  . Sexual activity: Yes    Birth control/ protection: None   Other Topics Concern  . Not on file   Social History Narrative  . No narrative on file    Family History  Problem Relation Age of Onset  . Pancreatitis Father 30    hereditary  . Hypertension Father   . GER disease Father   . Hypothyroidism Mother   . Breast cancer Maternal Aunt   . Congenital heart disease Maternal Grandfather   . Prostate cancer Maternal Grandfather   . Alzheimer's disease Maternal Grandfather      Review of Systems:   ROS: GENERAL: No Fever, chills, or diaphoresis. Positive for fatigue/malaise.  HEENT: No nasal discharge or bleeding. No throat pain or swelling. No eye pain or eye redness.  RESPIRATORY: No cough, wheezing, or shortness of breath.  CARDIOVASCULAR: No chest pain or palpitations.  GI: Has abdominal pain, nausea, vomiting, and diarrhea. No constipation, or bloody stool.  NEUROLOGICAL: No headache or focal weakness.  INTEGUMENT: no rashes, itching, or new lesions.  LYMPHATIC SYSTEM: no lymph node swelling or pain.  MUSCULOSKELETAL: no joint pain or joint  swelling.  GENITOURINARY: Mild dysuria. No hematuria.  ENDOCRINE: see HPI.  HEME: No chronic anemia, bleeding, or easy bruising.   Physical Exam:  Vitals:   07/01/16 0215 07/01/16 0216 07/01/16 0217 07/01/16 0430  BP:  118/85  97/68  Pulse:  104  106  Resp:  20  26  Temp:  98.3 F (36.8 C)    SpO2:  99%  100%  Weight: 49.9 kg (110 lb)  49.9 kg (110 lb)   Height: 5\' 2"  (1.575 m)  5\' 2"  (1.575 m)    Blood pressure 97/68, pulse 106, temperature 98.3 F (36.8 C), resp. rate 26, height 5\' 2"  (1.575 m), weight 49.9 kg (110 lb), last menstrual period 06/10/2016, SpO2 100 %.  GENERAL: Ill-appearing, well nourished, well-developed.  HEENT: Normocephalic, atraumatic; pupils equal and round. Nares patent, without discharge or bleeding. No oropharyngeal lesions or erythema. Mucous membranes are dry. Breath with odor of acetone. NECK: is supple, no masses, trachea midline.  RESPIRATORY: Clear to auscultation bilaterally. Chest wall movements are symmetric. No use of accessory muscles to breathe.  No wheezing, rales, rhonchi. CARDIOVASCULAR: Normal S1, S2. No rubs, or gallops. PMI non-displaced. Carotids: no carotid bruits. Mild tachycardia. DP pulses 2+ bilaterally. Capillary refill less than 3 seconds. GI: soft, non-distended, normal active bowel sounds. No hepatosplenomegaly. Mild tenderness in the LLQ area and suprapubic area. INTEGUMENT: Clean, dry, and intact. No rashes. MUSCULOSKELETAL: Moving all extremities. No cyanosis. No clubbing. Edema: none bilaterally.  NEUROLOGICAL: Cranial nerves 2-12 grossly intact. Reflexes: 2+ bilaterally. Babinski: toes downgoing bilaterally. Motor 5/5 throughout. Sensory grossly intact to light touch. Intact rapid alternating movements bilaterally. No pronator drift.  PSYCHIATRIC: Fully oriented. Normal and appropriate affect.  LYMPHATIC: No cervical lymphadenopathy. No supraclavicular lymphadenopathy.   Labs on Admission: I have personally reviewed  following labs and imaging studies.  Results for orders placed or performed during the hospital encounter of 07/01/16 (from the past 24 hour(s))  POC CBG, ED   Collection Time: 07/01/16  2:20 AM  Result Value Ref Range   Glucose-Capillary >600 (HH) 65 - 99 mg/dL  CBC with Differential   Collection Time: 07/01/16  2:52 AM  Result Value Ref Range   WBC 21.2 (H) 4.0 - 10.5 K/uL   RBC 4.81 3.87 - 5.11 MIL/uL   Hemoglobin 14.1 12.0 - 15.0 g/dL   HCT 43.0 36.0 - 46.0 %   MCV 89.4 78.0 - 100.0 fL   MCH 29.3 26.0 - 34.0 pg   MCHC 32.8 30.0 - 36.0 g/dL   RDW 12.3 11.5 - 15.5 %   Platelets 529 (H) 150 - 400 K/uL   Neutrophils Relative % 90 %   Neutro Abs 19.1 (H) 1.7 - 7.7 K/uL   Lymphocytes Relative 7 %   Lymphs Abs 1.6 0.7 - 4.0 K/uL   Monocytes Relative 3 %   Monocytes Absolute 0.5 0.1 - 1.0 K/uL   Eosinophils Relative 0 %   Eosinophils Absolute 0.0 0.0 - 0.7 K/uL   Basophils Relative 0 %   Basophils Absolute 0.0 0.0 - 0.1 K/uL  Comprehensive metabolic panel   Collection Time: 07/01/16  2:52 AM  Result Value Ref Range   Sodium 137 135 - 145 mmol/L   Potassium 4.8 3.5 - 5.1 mmol/L   Chloride 101 101 - 111 mmol/L   CO2 14 (L) 22 - 32 mmol/L   Glucose, Bld 528 (HH) 65 - 99 mg/dL   BUN 17 6 - 20 mg/dL   Creatinine, Ser 1.02 (H) 0.44 - 1.00 mg/dL   Calcium 9.7 8.9 - 10.3 mg/dL   Total Protein 8.8 (H) 6.5 - 8.1 g/dL   Albumin 5.0 3.5 - 5.0 g/dL   AST 29 15 - 41 U/L   ALT 18 14 - 54 U/L   Alkaline Phosphatase 113 38 - 126 U/L   Total Bilirubin 1.5 (H) 0.3 - 1.2 mg/dL   GFR calc non Af Amer >60 >60 mL/min   GFR calc Af Amer >60 >60 mL/min   Anion gap 22 (H) 5 - 15  Lipase, blood   Collection Time: 07/01/16  2:52 AM  Result Value Ref Range   Lipase 15 11 - 51 U/L  Urinalysis, Routine w reflex microscopic   Collection Time:  07/01/16  3:45 AM  Result Value Ref Range   Color, Urine YELLOW YELLOW   APPearance CLEAR CLEAR   Specific Gravity, Urine 1.020 1.005 - 1.030   pH 5.5  5.0 - 8.0   Glucose, UA >=500 (A) NEGATIVE mg/dL   Hgb urine dipstick NEGATIVE NEGATIVE   Bilirubin Urine NEGATIVE NEGATIVE   Ketones, ur >80 (A) NEGATIVE mg/dL   Protein, ur NEGATIVE NEGATIVE mg/dL   Nitrite NEGATIVE NEGATIVE   Leukocytes, UA NEGATIVE NEGATIVE  Pregnancy, urine   Collection Time: 07/01/16  3:45 AM  Result Value Ref Range   Preg Test, Ur NEGATIVE NEGATIVE  Urinalysis, Microscopic (reflex)   Collection Time: 07/01/16  3:45 AM  Result Value Ref Range   RBC / HPF 0-5 0 - 5 RBC/hpf   WBC, UA NONE SEEN 0 - 5 WBC/hpf   Bacteria, UA NONE SEEN NONE SEEN   Squamous Epithelial / LPF 0-5 (A) NONE SEEN  CBG monitoring, ED   Collection Time: 07/01/16  4:23 AM  Result Value Ref Range   Glucose-Capillary 276 (H) 65 - 99 mg/dL  CBG monitoring, ED   Collection Time: 07/01/16  5:28 AM  Result Value Ref Range   Glucose-Capillary 243 (H) 65 - 99 mg/dL        Radiological Exams on Admission: None   Assessment/Plan  DKA (diabetic ketoacidoses) ([facility]) Severe. Has an elevated anion gap. Plan: [facility] to [facility] unit.  Order DKA protocol. Insulin IV gtt.  Measure basic metabolic panel and other labs per schedule.  Replace potassium per protocol.    Hereditary pancreatitis Chronic issue.  Plan: Continue usual care.  Nausea and vomiting Due to DKA. Plan: When necessary Zofran and Phenergan.  Abdominal pain, left lower quadrant Likely related to her DKA. She states this is the area where she normally has pain when she gets DKA. Plan: Treatment DKA. When necessary hydrocodone.  Leukocytosis Likely reactive. Doubt infection. Plan: Monitor for signs of infection. Repeat CBC tomorrow.        DVT prophylaxis: Lovenox.  Code Status:  Full   Family Communication: Discussed with mother at bedside.  Disposition Plan: Home, in 2 days. Consults called: None  Admission status: Inpatient   Patient requires admission due to risks, which include: Death, coma,  worsening pain and suffering.   Tacey Ruiz MD [facility] Pager [phone]

## 2016-07-01 NOTE — Progress Notes (Signed)
Patient with a few hours ago for DKA, she has DM type I, continue DKA protocol, once gap closed, start on long-acting insulin, we'll stop insulin drip and D5 5 hours after long-acting insulin has been administered. Patient denies any noncompliance check A1c, chest x-ray UA unrevealing.   Awaits bed on the floor still in ER.

## 2016-07-01 NOTE — ED Notes (Signed)
Patient on 5-lead at this time.

## 2016-07-01 NOTE — ED Triage Notes (Signed)
Pt here with nausea and vomiting x 3-4 days and states her blood sugars are reading "too high" for her machine to register.

## 2016-07-02 LAB — BASIC METABOLIC PANEL
Anion gap: 6 (ref 5–15)
BUN: 7 mg/dL (ref 6–20)
CHLORIDE: 110 mmol/L (ref 101–111)
CO2: 20 mmol/L — AB (ref 22–32)
CREATININE: 0.45 mg/dL (ref 0.44–1.00)
Calcium: 8.3 mg/dL — ABNORMAL LOW (ref 8.9–10.3)
GFR calc non Af Amer: >60 mL/min (ref >60)
Glucose, Bld: 240 mg/dL — ABNORMAL HIGH (ref 65–99)
Potassium: 3.2 mmol/L — ABNORMAL LOW (ref 3.5–5.1)
Sodium: 136 mmol/L (ref 135–145)

## 2016-07-02 LAB — HEMOGLOBIN A1C
Hgb A1c MFr Bld: 11.6 % — ABNORMAL HIGH (ref 4.8–5.6)
Mean Plasma Glucose: 286 mg/dL

## 2016-07-02 LAB — GLUCOSE, CAPILLARY
GLUCOSE-CAPILLARY: 216 mg/dL — AB (ref 65–99)
GLUCOSE-CAPILLARY: 106 mg/dL — AB (ref 65–99)
Glucose-Capillary: 187 mg/dL — ABNORMAL HIGH (ref 65–99)
Glucose-Capillary: 73 mg/dL (ref 65–99)

## 2016-07-02 MED ORDER — INSULIN ASPART 100 UNIT/ML ~~LOC~~ SOLN
2.0000 [IU] | Freq: Three times a day (TID) | SUBCUTANEOUS | Status: DC
  Administered 2016-07-02 – 2016-07-03 (×3): 2 [IU] via SUBCUTANEOUS

## 2016-07-02 MED ORDER — POTASSIUM CHLORIDE CRYS ER 20 MEQ PO TBCR
40.0000 meq | EXTENDED_RELEASE_TABLET | Freq: Once | ORAL | Status: AC
  Administered 2016-07-02: 40 meq via ORAL
  Filled 2016-07-02: qty 2

## 2016-07-02 MED ORDER — PROMETHAZINE HCL 25 MG/ML IJ SOLN: 12.5000 mg | Freq: Four times a day (QID) | INTRAMUSCULAR | Status: DC | PRN

## 2016-07-02 MED ORDER — INSULIN GLARGINE 100 UNIT/ML ~~LOC~~ SOLN
30.0000 [IU] | Freq: Every day | SUBCUTANEOUS | Status: DC
  Administered 2016-07-03: 30 [IU] via SUBCUTANEOUS
  Filled 2016-07-02 (×2): qty 0.3

## 2016-07-02 NOTE — Progress Notes (Signed)
Patient transferred to dept 300 A&O, in stable condition. Patient and patient's mother gathered patient's belongings and confirmed they had everything she came with. Report given to Jinny Blossom, RN on dept 300.

## 2016-07-02 NOTE — Progress Notes (Signed)
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   Assessment & Plan:   Principal Problem:   DKA (diabetic ketoacidoses) ([place]) Active Problems:   Hereditary pancreatitis   Nausea and vomiting   Abdominal pain, left lower quadrant   Leukocytosis   Diabetic Ketoacidosis: Initially admitted to stepdown and started on insulin gtt. Later on transitioned to home lantus and SSI added novolog tidac 2 units . hgba1c is 11.6 Abd/ chest x ray unremarkable.    Hereditary Pancreatitis:  - no new complaints. Some abdominal pain, nausea and vomiting today.  Will get lipase levels.    Leukocytosis: Reactive.    Hypokalemic: - replete as needed.    DVT prophylaxis: (Lovenox) Code Status: (Full) Family Communication: none at bedside Disposition Plan: pending further evaluation.    Consultants:   none   Procedures: none   Antimicrobials: none   Subjective: Nauseated.   Objective: Vitals:   07/02/16 0900 07/02/16 1000 07/02/16 1133 07/02/16 1155  BP:  106/70    Pulse: (!) 112 82  81  Resp: (!) 26 14  17   Temp:   98.6 F (37 C)   TempSrc:   Oral   SpO2: 100% 99%  98%  Weight:      Height:        Intake/Output Summary (Last 24 hours) at 07/02/16 1451 Last data filed at 07/01/16 2000  Gross per 24 hour  Intake           1139.9 ml  Output                0 ml  Net           1139.9 ml   Filed Weights   07/01/16 0217 07/01/16 1515 07/02/16 0456  Weight: 49.9 kg (110 lb) 50.4 kg (111 lb 1.8 oz) 50.2 kg (110 lb 10.7 oz)    Examination:  General exam: Appears calm and comfortable  Respiratory system: Clear to auscultation. Respiratory effort normal. Cardiovascular system: S1 & S2 heard, RRR. No JVD, murmurs, rubs, gallops or clicks. No pedal edema. Gastrointestinal system:  Abdomen is nondistended, soft and nontender. No organomegaly or masses felt. Normal bowel sounds heard. Central nervous system: Alert and oriented. No focal neurological deficits. Extremities: Symmetric 5 x 5 power. Skin: No rashes, lesions or ulcers     Data Reviewed: I have personally reviewed following labs and imaging studies  CBC:  Recent Labs Lab 07/01/16 0252  WBC 21.2*  NEUTROABS 19.1*  HGB 14.1  HCT 43.0  MCV 89.4  PLT 123XX123*   Basic Metabolic Panel:  Recent Labs Lab 07/01/16 0252 07/01/16 0600 07/01/16 0949 07/02/16 0355  NA 137 137 137 136  K 4.8 4.5 4.2 3.2*  CL 101 112* 112* 110  CO2 14* 13* 18* 20*  GLUCOSE 528* 247* 125* 240*  BUN 17 13 9 7   CREATININE 1.02* 0.64 0.52 0.45  CALCIUM 9.7 8.4* 8.2* 8.3*  MG  --  1.9  --   --    GFR: Estimated Creatinine Clearance: 86.5 mL/min (by C-G formula based on SCr of 0.45 mg/dL). Liver Function Tests:  Recent Labs Lab 07/01/16 0252  AST 29  ALT 18  ALKPHOS 113  BILITOT 1.5*  PROT 8.8*  ALBUMIN 5.0    Recent Labs Lab 07/01/16 0252  LIPASE  15   No results for input(s): AMMONIA in the last 168 hours. Coagulation Profile: No results for input(s): INR, PROTIME in the last 168 hours. Cardiac Enzymes: No results for input(s): CKTOTAL, CKMB, CKMBINDEX, TROPONINI in the last 168 hours. BNP (last 3 results) No results for input(s): PROBNP in the last 8760 hours. HbA1C:  Recent Labs  07/01/16 0252  HGBA1C 11.6*   CBG:  Recent Labs Lab 07/01/16 1726 07/01/16 1832 07/01/16 2117 07/02/16 0731 07/02/16 1118  GLUCAP 261* 230* 170* 187* 73   Lipid Profile: No results for input(s): CHOL, HDL, LDLCALC, TRIG, CHOLHDL, LDLDIRECT in the last 72 hours. Thyroid Function Tests: No results for input(s): TSH, T4TOTAL, FREET4, T3FREE, THYROIDAB in the last 72 hours. Anemia Panel: No results for input(s): VITAMINB12, FOLATE, FERRITIN, TIBC, IRON, RETICCTPCT in the last 72 hours. Sepsis Labs: No results  for input(s): PROCALCITON, LATICACIDVEN in the last 168 hours.  Recent Results (from the past 240 hour(s))  MRSA PCR Screening     Status: None   Collection Time: 07/01/16  3:39 PM  Result Value Ref Range Status   MRSA by PCR NEGATIVE NEGATIVE Final    Comment:        The GeneXpert MRSA Assay (FDA approved for NASAL specimens only), is one component of a comprehensive MRSA colonization surveillance program. It is not intended to diagnose MRSA infection nor to guide or monitor treatment for MRSA infections.          Radiology Studies: Dg Abd Acute W/chest  Result Date: 07/01/2016 CLINICAL DATA:  One week history abdominal pain with vomiting and diarrhea EXAM: DG ABDOMEN ACUTE W/ 1V CHEST COMPARISON:  CT abdomen and pelvis May 05, 2015; abdominal radiograph September 14, 2014; chest radiograph March 30, 2013 FINDINGS: PA chest: Lungs are clear. Heart size and pulmonary vascularity are normal. No adenopathy. Supine and upright abdomen: There is moderate stool throughout the colon. There is no bowel dilatation or air-fluid levels suggesting bowel obstruction. No free air. No abnormal calcifications. IMPRESSION: No evident bowel obstruction or free air.  Lungs clear. Electronically Signed   By: Lowella Grip III M.D.   On: 07/01/2016 08:10        Scheduled Meds: . enoxaparin (LOVENOX) injection  40 mg Subcutaneous Q24H  . ferrous sulfate  325 mg Oral Q breakfast  . insulin aspart  0-15 Units Subcutaneous TID WC  . insulin aspart  0-5 Units Subcutaneous QHS  . insulin aspart  2 Units Subcutaneous TID WC  . [START ON 07/03/2016] insulin glargine  30 Units Subcutaneous Daily  . lipase/protease/amylase  48,000 Units Oral TID WC  . pantoprazole  40 mg Oral Daily  . potassium chloride  40 mEq Oral Once  . vitamin C  500 mg Oral Daily   Continuous Infusions:   LOS: 1 day    Time spent: 30 minutes    Torra Pala, MD [facility] Pager [phone]  If 7PM-7AM,  please contact night-coverage [email or web address] Password [email or web address] 07/02/2016, 2:51 PM

## 2016-07-02 NOTE — Progress Notes (Signed)
Inpatient Diabetes Program Recommendations  AACE/ADA: New Consensus Statement on Inpatient Glycemic Control (2015)  Target Ranges:  Prepandial:   less than 140 mg/dL      Peak postprandial:   less than 180 mg/dL (1-2 hours)      Critically ill patients:  140 - 180 mg/dL  Results for Linda Walls, Linda Walls (MRN [id number]) as of 07/02/2016 07:33  Ref. Range 07/01/2016 08:34 07/01/2016 09:42 07/01/2016 11:17 07/01/2016 12:16 07/01/2016 14:07 07/01/2016 15:29 07/01/2016 16:49 07/01/2016 17:26 07/01/2016 18:32 07/01/2016 21:17 07/02/2016 07:31  Glucose-Capillary Latest Ref Range: 65 - 99 mg/dL 126 (H) 112 (H) 106 (H) 89 282 (H) 266 (H) 268 (H) 261 (H) 230 (H) 170 (H) 187 (H)    Review of Glycemic Control Diabetes history: DM1 Outpatient Diabetes medications: Tresiba 30 units QHS, Novolog 15-21 units TID with meals (for meal coverage and correction) Current orders for Inpatient glycemic control: Lantus 35 units daily, Novolog 0-15 units TID with meals, Novolog 0-5 units QHS  Inpatient Diabetes Program Recommendations: Insulin - Basal: Patient received Lantus 10 units at 12:55 on 07/01/16 and Lantus 20 units at 17:15 on 07/02/15. Please consider decreasing Lantus to 25 units Q24H (scheduled to start at 17:00). Correction (SSI):Please consider decreasing Novolog correction to 0-9 units TID with meals. Insulin - Meal Coverage: Please consider ordering Novolog 4 units TID with meals for meal coverage if patient eats at least 50% of meals.  Thanks, Barnie Alderman, RN, MSN, CDE Diabetes Coordinator Inpatient Diabetes Program [phone] (Team Pager from 8am to 5pm)

## 2016-07-03 LAB — BASIC METABOLIC PANEL
ANION GAP: 7 (ref 5–15)
BUN: 7 mg/dL (ref 6–20)
CHLORIDE: 108 mmol/L (ref 101–111)
CO2: 21 mmol/L — AB (ref 22–32)
Calcium: 8.6 mg/dL — ABNORMAL LOW (ref 8.9–10.3)
Creatinine, Ser: 0.4 mg/dL — ABNORMAL LOW (ref 0.44–1.00)
GFR calc non Af Amer: >60 mL/min (ref >60)
Glucose, Bld: 221 mg/dL — ABNORMAL HIGH (ref 65–99)
Potassium: 3.7 mmol/L (ref 3.5–5.1)
Sodium: 136 mmol/L (ref 135–145)

## 2016-07-03 LAB — CBC
HEMATOCRIT: 34.8 % — AB (ref 36.0–46.0)
HEMOGLOBIN: 11.7 g/dL — AB (ref 12.0–15.0)
MCH: 30.1 pg (ref 26.0–34.0)
MCHC: 33.6 g/dL (ref 30.0–36.0)
MCV: 89.5 fL (ref 78.0–100.0)
Platelets: 323 10*3/uL (ref 150–400)
RBC: 3.89 MIL/uL (ref 3.87–5.11)
RDW: 13.1 % (ref 11.5–15.5)
WBC: 7.3 10*3/uL (ref 4.0–10.5)

## 2016-07-03 LAB — GLUCOSE, CAPILLARY
GLUCOSE-CAPILLARY: 166 mg/dL — AB (ref 65–99)
Glucose-Capillary: 83 mg/dL (ref 65–99)

## 2016-07-03 NOTE — Discharge Instructions (Signed)
Diabetic Ketoacidosis °Diabetic ketoacidosis is a life-threatening complication of diabetes. If it is not treated, it can cause severe dehydration and organ damage and can lead to a coma or death. °What are the causes? °This condition develops when there is not enough of the hormone insulin in the body. Insulin helps the body to break down sugar for energy. Without insulin, the body cannot break down sugar, so it breaks down fats instead. This leads to the production of acids that are called ketones. Ketones are poisonous at high levels. °This condition can be triggered by: °· Stress on the body that is brought on by an illness. °· Medicines that raise blood glucose levels. °· Not taking diabetes medicine. ° °What are the signs or symptoms? °Symptoms of this condition include: °· Fatigue. °· Weight loss. °· Excessive thirst. °· Light-headedness. °· Fruity or sweet-smelling breath. °· Excessive urination. °· Vision changes. °· Confusion or irritability. °· Nausea. °· Vomiting. °· Rapid breathing. °· Abdominal pain. °· Feeling flushed. ° °How is this diagnosed? °This condition is diagnosed based on a medical history, a physical exam, and blood tests. You may also have a urine test that checks for ketones. °How is this treated? °This condition may be treated with: °· Fluid replacement. This may be done to correct dehydration. °· Insulin injections. These may be given through the skin or through an IV tube. °· Electrolyte replacement. Electrolytes, such as potassium and sodium, may be given in pill form or through an IV tube. °· Antibiotic medicines. These may be prescribed if your condition was caused by an infection. ° °Follow these instructions at home: °Eating and drinking °· Drink enough fluids to keep your urine clear or pale yellow. °· If you cannot eat, alternate between drinking fluids with sugar (such as juice) and salty fluids (such as broth or bouillon). °· If you can eat, follow your usual diet and drink  sugar-free liquids, such as water. °Other Instructions ° °· Take insulin as directed by your health care provider. Do not skip insulin injections. Do not use expired insulin. °· If your blood sugar is over 240 mg/dL, monitor your urine ketones every 4-6 hours. °· If you were prescribed an antibiotic medicine, finish all of it even if you start to feel better. °· Rest and exercise only as directed by your health care provider. °· If you get sick, call your health care provider and begin treatment quickly. Your body often needs extra insulin to fight an illness. °· Check your blood glucose levels regularly. If your blood glucose is high, drink plenty of fluids. This helps to flush out ketones. °Contact a health care provider if: °· Your blood glucose level is too high or too low. °· You have ketones in your urine. °· You have a fever. °· You cannot eat. °· You cannot tolerate fluids. °· You have been vomiting for more than 2 hours. °· You continue to have symptoms of this condition. °· You develop new symptoms. °Get help right away if: °· Your blood glucose levels continue to be high (elevated). °· Your monitor reads “high” even when you are taking insulin. °· You faint. °· You have chest pain. °· You have trouble breathing. °· You have a sudden, severe headache. °· You have sudden weakness in one arm or one leg. °· You have sudden trouble speaking or swallowing. °· You have vomiting or diarrhea that gets worse after 3 hours. °· You feel severely fatigued. °· You have trouble thinking. °· You   have abdominal pain. °· You are severely dehydrated. Symptoms of severe dehydration include: °? Extreme thirst. °? Dry mouth. °? Blue lips. °? Cold hands and feet. °? Rapid breathing. °This information is not intended to replace advice given to you by your health care provider. Make sure you discuss any questions you have with your health care provider. °Document Released: 06/13/2000 Document Revised: 11/22/2015 Document  Reviewed: 05/24/2014 °[employer] Interactive Patient Education © 2017 [employer] ° °

## 2016-07-03 NOTE — Progress Notes (Signed)
Patient discharged home.  IVs removed - WNL.  Reviewed DC instructions and meds.  Follow up in place.  No questions at this time, verbalizes understanding.  Assisted off unit in NAD.

## 2016-07-04 NOTE — Discharge Summary (Signed)
Physician Discharge Summary  Linda Walls [id number] DOB: 11/16/1990 DOA: 07/01/2016  PCP: [name], MD  Admit date: 07/01/2016 Discharge date: 07/03/2016  Admitted From: Home.  Disposition:  Home.   Recommendations for Outpatient Follow-up:  1. Follow up with PCP in 1-2 weeks 2. Please obtain BMP/CBC in one week 3. Please follow up with endocrinology in one week.    Discharge Condition:stable.  CODE STATUS:full code.  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   Discharge Diagnoses:  Principal Problem:   DKA (diabetic ketoacidoses) ([place]) Active Problems:   Hereditary pancreatitis   Nausea and vomiting   Abdominal pain, left lower quadrant   Leukocytosis   Diabetic Ketoacidosis: Initially admitted to stepdown and started on insulin gtt. Later on transitioned to home lantus and SSI added novolog tidac 2 units . hgba1c is 11.6, discussed the results with the patient . Recommended outpatient follow up with endocrinology.  Abd/ chest x ray unremarkable.    Hereditary Pancreatitis:  - no new complaints. Nausea improved.      Leukocytosis: Reactive.    Hypokalemic: - repleted as needed.   Discharge Instructions  Discharge Instructions    Diet - low sodium heart healthy    Complete by:  As directed    Discharge instructions    Complete by:  As directed    Please follow up with endocrinology in one week.   Increase activity slowly    Complete by:  As directed      Allergies as of 07/03/2016      Reactions   Shellfish Allergy Nausea And Vomiting   Cefzil [cefprozil] Rash   Penicillins Rash   Has patient had a PCN reaction causing immediate rash, facial/tongue/throat swelling, SOB or lightheadedness with hypotension: NO Has patient had a PCN reaction causing severe rash involving mucus membranes or skin  necrosis: NO Has patient had a PCN reaction that required hospitalization: NO Has patient had a PCN reaction occurring within the last 10 years: NO If all of the above answers are "NO", then may proceed with Cephalosporin use.   Sulfa Antibiotics Rash      Medication List    TAKE these medications   CREON 24000-76000 units Cpep Generic drug:  Pancrelipase (Lip-Prot-Amyl) Use 2 caps with meals and 1 capsule with snacks upto 9 capsules a day   ferrous sulfate 325 (65 FE) MG tablet Take 325 mg by mouth daily with breakfast.   GLUCAGON EMERGENCY 1 MG injection Generic drug:  glucagon Inject 1 mg into the muscle once as needed.   hyoscyamine 0.125 MG SL tablet Commonly known as:  LEVSIN SL PLACE 1 TABLET UNDER THE TONGUE EVERY 4 HOURS AS NEEDED FOR PAIN.   hyoscyamine 0.125 MG tablet Commonly known as:  LEVSIN, ANASPAZ PLACE 1 TABLET UNDER THE TONGUE EVERY 4 HOURS AS NEEDED FOR PAIN.   insulin aspart 100 UNIT/ML FlexPen Commonly known as:  NOVOLOG Inject 15-21 Units into the skin 3 (three) times daily with meals.   Insulin Pen Needle 32G X 4 MM Misc Commonly known as:  BD PEN NEEDLE NANO U/F 1 each by Does not apply route 4 (four) times daily.   omeprazole 20 MG capsule Commonly known as:  PRILOSEC TAKE ONE CAPSULE BY MOUTH DAILY AS NEEDED FOR ACID REFLUX.   ONETOUCH DELICA LANCETS 99991111 Misc Use to test glucose up to 6 times a  day   oxyCODONE-acetaminophen 10-325 MG tablet Commonly known as:  PERCOCET TAKE ONE HALF TABLET BID PRN PAIN   promethazine 25 MG tablet Commonly known as:  PHENERGAN Take 1 tablet (25 mg total) by mouth every 6 (six) hours as needed. for nausea   SUMAtriptan 50 MG tablet Commonly known as:  IMITREX TAKE 1 TABLET BY MOUTH ONCE. MAY REPEAT IN 2 HOURS IF HEADACHE PERSISTS OR RECURS.   TRESIBA FLEXTOUCH 100 UNIT/ML Sopn FlexTouch Pen Generic drug:  insulin degludec INJECT 30 UNITS SUBCUTANEOUSLY AT BEDTIME.   vitamin C 500 MG tablet Commonly  known as:  ASCORBIC ACID Take 500 mg by mouth daily.   Vitamin D3 5000 units Caps Take 1 capsule (5,000 Units total) by mouth daily.      Follow-up Information    endocrine- Follow up.   Why:  follow up with endocrinologist in 1 week         Allergies  Allergen Reactions  . Shellfish Allergy Nausea And Vomiting  . Cefzil [Cefprozil] Rash  . Penicillins Rash    Has patient had a PCN reaction causing immediate rash, facial/tongue/throat swelling, SOB or lightheadedness with hypotension: NO Has patient had a PCN reaction causing severe rash involving mucus membranes or skin necrosis: NO Has patient had a PCN reaction that required hospitalization: NO Has patient had a PCN reaction occurring within the last 10 years: NO If all of the above answers are "NO", then may proceed with Cephalosporin use.   . Sulfa Antibiotics Rash    Consultations:  None.    Procedures/Studies: Dg Abd Acute W/chest  Result Date: 07/01/2016 CLINICAL DATA:  One week history abdominal pain with vomiting and diarrhea EXAM: DG ABDOMEN ACUTE W/ 1V CHEST COMPARISON:  CT abdomen and pelvis May 05, 2015; abdominal radiograph September 14, 2014; chest radiograph March 30, 2013 FINDINGS: PA chest: Lungs are clear. Heart size and pulmonary vascularity are normal. No adenopathy. Supine and upright abdomen: There is moderate stool throughout the colon. There is no bowel dilatation or air-fluid levels suggesting bowel obstruction. No free air. No abnormal calcifications. IMPRESSION: No evident bowel obstruction or free air.  Lungs clear. Electronically Signed   By: Lowella Grip III M.D.   On: 07/01/2016 08:10       Subjective: Mildly nauseated. No other coomplaints.   Discharge Exam: Vitals:   07/02/16 2101 07/03/16 0700  BP: 101/66 (!) 106/57  Pulse: 75 69  Resp: 20 20  Temp: 99.1 F (37.3 C) 98.5 F (36.9 C)   Vitals:   07/02/16 1600 07/02/16 1628 07/02/16 2101 07/03/16 0700  BP: (!) 99/54   101/66 (!) 106/57  Pulse: 91  75 69  Resp: 15  20 20   Temp:  98.9 F (37.2 C) 99.1 F (37.3 C) 98.5 F (36.9 C)  TempSrc:  Oral Oral Oral  SpO2: 97%  100% 98%  Weight:      Height:        General: Pt is alert, awake, not in acute distress Cardiovascular: RRR, S1/S2 +, no rubs, no gallops Respiratory: CTA bilaterally, no wheezing, no rhonchi Abdominal: Soft, NT, ND, bowel sounds + Extremities: no edema, no cyanosis    The results of significant diagnostics from this hospitalization (including imaging, microbiology, ancillary and laboratory) are listed below for reference.     Microbiology: Recent Results (from the past 240 hour(s))  MRSA PCR Screening     Status: None   Collection Time: 07/01/16  3:39 PM  Result Value Ref Range  Status   MRSA by PCR NEGATIVE NEGATIVE Final    Comment:        The GeneXpert MRSA Assay (FDA approved for NASAL specimens only), is one component of a comprehensive MRSA colonization surveillance program. It is not intended to diagnose MRSA infection nor to guide or monitor treatment for MRSA infections.      Labs: BNP (last 3 results) No results for input(s): BNP in the last 8760 hours. Basic Metabolic Panel:  Recent Labs Lab 07/01/16 0252 07/01/16 0600 07/01/16 0949 07/02/16 0355 07/03/16 0412  NA 137 137 137 136 136  K 4.8 4.5 4.2 3.2* 3.7  CL 101 112* 112* 110 108  CO2 14* 13* 18* 20* 21*  GLUCOSE 528* 247* 125* 240* 221*  BUN 17 13 9 7 7   CREATININE 1.02* 0.64 0.52 0.45 0.40*  CALCIUM 9.7 8.4* 8.2* 8.3* 8.6*  MG  --  1.9  --   --   --    Liver Function Tests:  Recent Labs Lab 07/01/16 0252  AST 29  ALT 18  ALKPHOS 113  BILITOT 1.5*  PROT 8.8*  ALBUMIN 5.0    Recent Labs Lab 07/01/16 0252  LIPASE 15   No results for input(s): AMMONIA in the last 168 hours. CBC:  Recent Labs Lab 07/01/16 0252 07/03/16 0412  WBC 21.2* 7.3  NEUTROABS 19.1*  --   HGB 14.1 11.7*  HCT 43.0 34.8*  MCV 89.4 89.5  PLT  529* 323   Cardiac Enzymes: No results for input(s): CKTOTAL, CKMB, CKMBINDEX, TROPONINI in the last 168 hours. BNP: Invalid input(s): POCBNP CBG:  Recent Labs Lab 07/02/16 1118 07/02/16 1611 07/02/16 2105 07/03/16 0750 07/03/16 1136  GLUCAP 73 216* 106* 166* 83   D-Dimer No results for input(s): DDIMER in the last 72 hours. Hgb A1c No results for input(s): HGBA1C in the last 72 hours. Lipid Profile No results for input(s): CHOL, HDL, LDLCALC, TRIG, CHOLHDL, LDLDIRECT in the last 72 hours. Thyroid function studies No results for input(s): TSH, T4TOTAL, T3FREE, THYROIDAB in the last 72 hours.  Invalid input(s): FREET3 Anemia work up No results for input(s): VITAMINB12, FOLATE, FERRITIN, TIBC, IRON, RETICCTPCT in the last 72 hours. Urinalysis    Component Value Date/Time   COLORURINE YELLOW 07/01/2016 0345   APPEARANCEUR CLEAR 07/01/2016 0345   LABSPEC 1.020 07/01/2016 0345   PHURINE 5.5 07/01/2016 0345   GLUCOSEU >=500 (A) 07/01/2016 0345   HGBUR NEGATIVE 07/01/2016 0345   BILIRUBINUR NEGATIVE 07/01/2016 0345   KETONESUR >80 (A) 07/01/2016 0345   PROTEINUR NEGATIVE 07/01/2016 0345   UROBILINOGEN 0.2 05/06/2015 0045   NITRITE NEGATIVE 07/01/2016 0345   LEUKOCYTESUR NEGATIVE 07/01/2016 0345   Sepsis Labs Invalid input(s): PROCALCITONIN,  WBC,  LACTICIDVEN Microbiology Recent Results (from the past 240 hour(s))  MRSA PCR Screening     Status: None   Collection Time: 07/01/16  3:39 PM  Result Value Ref Range Status   MRSA by PCR NEGATIVE NEGATIVE Final    Comment:        The GeneXpert MRSA Assay (FDA approved for NASAL specimens only), is one component of a comprehensive MRSA colonization surveillance program. It is not intended to diagnose MRSA infection nor to guide or monitor treatment for MRSA infections.      Time coordinating discharge: Over 30 minutes  SIGNED:   Hosie Poisson, MD  [facility] 07/04/2016, 9:42 AM Pager   If 7PM-7AM,  please contact night-coverage [email or web address] Password [email or web address]

## 2016-07-10 ENCOUNTER — Ambulatory Visit: Payer: BC Managed Care – PPO | Admitting: "Endocrinology

## 2016-07-16 ENCOUNTER — Ambulatory Visit: Payer: BC Managed Care – PPO | Admitting: Family Medicine

## 2016-07-19 ENCOUNTER — Other Ambulatory Visit: Payer: Self-pay | Admitting: "Endocrinology

## 2016-07-19 LAB — COMPREHENSIVE METABOLIC PANEL
ALT: 13 U/L (ref 6–29)
AST: 19 U/L (ref 10–30)
Albumin: 4.7 g/dL (ref 3.6–5.1)
Alkaline Phosphatase: 100 U/L (ref 33–115)
BUN: 10 mg/dL (ref 7–25)
CHLORIDE: 102 mmol/L (ref 98–110)
CO2: 25 mmol/L (ref 20–31)
CREATININE: 0.56 mg/dL (ref 0.50–1.10)
Calcium: 10.1 mg/dL (ref 8.6–10.2)
GLUCOSE: 89 mg/dL (ref 65–99)
POTASSIUM: 3.9 mmol/L (ref 3.5–5.3)
SODIUM: 140 mmol/L (ref 135–146)
Total Bilirubin: 0.5 mg/dL (ref 0.2–1.2)
Total Protein: 7.9 g/dL (ref 6.1–8.1)

## 2016-07-19 LAB — LIPID PANEL
CHOL/HDL RATIO: 2.8 ratio (ref <5.0)
Cholesterol: 163 mg/dL (ref <200)
HDL: 59 mg/dL (ref >50)
LDL CALC: 75 mg/dL (ref <100)
Triglycerides: 144 mg/dL (ref <150)
VLDL: 29 mg/dL (ref <30)

## 2016-07-19 LAB — T4, FREE: FREE T4: 1.3 ng/dL (ref 0.8–1.8)

## 2016-07-19 LAB — TSH: TSH: 1.55 mIU/L

## 2016-07-21 LAB — HEMOGLOBIN A1C
Hgb A1c MFr Bld: 10.7 % — ABNORMAL HIGH (ref <5.7)
Mean Plasma Glucose: 260 mg/dL

## 2016-07-24 ENCOUNTER — Encounter: Payer: Self-pay | Admitting: "Endocrinology

## 2016-07-24 ENCOUNTER — Ambulatory Visit (INDEPENDENT_AMBULATORY_CARE_PROVIDER_SITE_OTHER): Payer: BC Managed Care – PPO | Admitting: "Endocrinology

## 2016-07-24 VITALS — BP 117/76 | HR 93 | Ht 62.0 in | Wt 111.0 lb

## 2016-07-24 DIAGNOSIS — E0865 Diabetes mellitus due to underlying condition with hyperglycemia: Secondary | ICD-10-CM | POA: Diagnosis not present

## 2016-07-24 DIAGNOSIS — IMO0002 Reserved for concepts with insufficient information to code with codable children: Secondary | ICD-10-CM

## 2016-07-24 DIAGNOSIS — K8689 Other specified diseases of pancreas: Secondary | ICD-10-CM

## 2016-07-24 DIAGNOSIS — K8681 Exocrine pancreatic insufficiency: Secondary | ICD-10-CM

## 2016-07-24 NOTE — Progress Notes (Signed)
Subjective:    Patient ID: Linda Walls, female    DOB: 10/22/1990, PCP [name], MD   Past Medical History:  Diagnosis Date  . DKA (diabetic ketoacidoses) ([place])   . DM type 1 (diabetes mellitus, type 1) ([place])   . GERD (gastroesophageal reflux disease)   . Hereditary pancreatitis   . Hernia   . IBS (irritable bowel syndrome)   . Jaundice   . Migraines    Past Surgical History:  Procedure Laterality Date  . BILE DUCT STENT PLACEMENT  2006 at [place] 6 months after it was placed.  Marland Kitchen HERNIA REPAIR     As infant.  Marland Kitchen PANCREATIC CYST DRAINAGE  at age 60 year   Social History   Social History  . Marital status: Single    Spouse name: N/A  . Number of children: N/A  . Years of education: N/A   Social History Main Topics  . Smoking status: Never Smoker  . Smokeless tobacco: Never Used  . Alcohol use No  . Drug use: No  . Sexual activity: Yes    Birth control/ protection: None   Other Topics Concern  . None   Social History Narrative  . None   Outpatient Encounter Prescriptions as of 07/24/2016  Medication Sig  . Cholecalciferol (VITAMIN D3) 5000 units CAPS Take 1 capsule (5,000 Units total) by mouth daily.  Marland Kitchen CREON 24000-76000 units CPEP Use 2 caps with meals and 1 capsule with snacks upto 9 capsules a day  . ferrous sulfate 325 (65 FE) MG tablet Take 325 mg by mouth daily with breakfast.  . GLUCAGON EMERGENCY 1 MG injection Inject 1 mg into the muscle once as needed.  . hyoscyamine (LEVSIN SL) 0.125 MG SL tablet PLACE 1 TABLET UNDER THE TONGUE EVERY 4 HOURS AS NEEDED FOR PAIN.  . hyoscyamine (LEVSIN, ANASPAZ) 0.125 MG tablet PLACE 1 TABLET UNDER THE TONGUE EVERY 4 HOURS AS NEEDED FOR PAIN.  Marland Kitchen insulin aspart (NOVOLOG) 100 UNIT/ML FlexPen Inject 15-21 Units into the skin 3 (three) times daily with meals.  . Insulin Pen Needle (BD PEN NEEDLE NANO U/F) 32G X 4 MM MISC 1 each by Does not apply route 4 (four) times daily.  Marland Kitchen omeprazole (PRILOSEC) 20 MG  capsule TAKE ONE CAPSULE BY MOUTH DAILY AS NEEDED FOR ACID REFLUX.  Marland Kitchen ONETOUCH DELICA LANCETS 71E MISC Use to test glucose up to 6 times a day  . oxyCODONE-acetaminophen (PERCOCET) 10-325 MG tablet TAKE ONE HALF TABLET BID PRN PAIN  . promethazine (PHENERGAN) 25 MG tablet Take 1 tablet (25 mg total) by mouth every 6 (six) hours as needed. for nausea  . SUMAtriptan (IMITREX) 50 MG tablet TAKE 1 TABLET BY MOUTH ONCE. MAY REPEAT IN 2 HOURS IF HEADACHE PERSISTS OR RECURS.  . TRESIBA FLEXTOUCH 100 UNIT/ML SOPN INJECT 30 UNITS SUBCUTANEOUSLY AT BEDTIME.  . vitamin C (ASCORBIC ACID) 500 MG tablet Take 500 mg by mouth daily.   No facility-administered encounter medications on file as of 07/24/2016.    ALLERGIES: Allergies  Allergen Reactions  . Shellfish Allergy Nausea And Vomiting  . Cefzil [Cefprozil] Rash  . Penicillins Rash    Has patient had a PCN reaction causing immediate rash, facial/tongue/throat swelling, SOB or lightheadedness with hypotension: NO Has patient had a PCN reaction causing severe rash involving mucus membranes or skin necrosis: NO Has patient had a PCN reaction that required hospitalization: NO Has patient had a PCN reaction occurring within the last 10  years: NO If all of the above answers are "NO", then may proceed with Cephalosporin use.   . Sulfa Antibiotics Rash   VACCINATION STATUS: Immunization History  Administered Date(s) Administered  . DTaP 06/19/1993, 08/20/1993, 10/16/1993, 04/28/1994, 12/04/1998  . Hepatitis B 06/19/1993, 10/16/1993  . IPV 06/19/1993, 08/20/1993, 10/16/1993, 12/04/1998  . Influenza Split 05/19/2011, 05/01/2012  . Influenza, Seasonal, Injecte, Preservative Fre 04/15/2016  . Influenza,inj,Quad PF,36+ Mos 03/08/2013, 02/23/2015  . MMR 04/28/1994, 12/04/1998  . Pneumococcal Polysaccharide-23 05/19/2011, 10/16/2014  . 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  - She was started and  continued on strict basal/bolus insulin therapy. Her last visit A1c of 8.9% was improving from her prior records. Unfortunately in the interval she developed DKA which required hospitalization and IV insulin infusion. She was discharged on same basal/bolus insulin regimen. Her A1c in the hospital was found to be 11.6%.  - Since discharge, her A1c has dropped to 10.7%. - She has lost significant amount of weight.  -She is  using DEXCOM continuous glucose monitoring device. She did not bring her logs nor meter to review today.  she also remains on Creon 24000 ( 2 caps TIDAC , 1 caps per snacks). she has regular menstrual cycles. - He feels better  since her discharge. -She is up-to-date on ophthalmology and dental care. -She is status post [facility].  -Review of Systems  Constitutional: Positive for fatigue. Negative for unexpected weight change.  HENT: Negative for trouble swallowing and voice change.   Eyes: Negative for visual disturbance.  Respiratory: Negative for cough, shortness of breath and wheezing.   Cardiovascular: Negative for chest pain, palpitations and leg swelling.  Gastrointestinal: Negative for diarrhea, nausea and vomiting.  Endocrine: Positive for polydipsia and polyuria. Negative for cold intolerance, heat intolerance and polyphagia.  Musculoskeletal: Negative for arthralgias and myalgias.  Skin: Negative for color change, pallor, rash and wound.  Neurological: Negative for seizures and headaches.  Psychiatric/Behavioral: Negative for confusion and suicidal ideas.    Objective:    BP 117/76   Pulse 93   Ht '5\' 2"'  (1.575 m)   Wt 111 lb (50.3 kg)   BMI 20.30 kg/m   Wt Readings from Last 3 Encounters:  07/24/16 111 lb (50.3 kg)  07/02/16 110 lb 10.7 oz (50.2 kg)  04/15/16 118 lb (53.5 kg)    Physical Exam  Constitutional: She is oriented to person, place, and time. She appears well-developed.  HENT:  Head: Normocephalic  and atraumatic.  Eyes: EOM are normal.  Neck: Normal range of motion. Neck supple. No tracheal deviation present. No thyromegaly present.  Cardiovascular: Normal rate and regular rhythm.   Pulmonary/Chest: Effort normal and breath sounds normal.  Abdominal: Soft. Bowel sounds are normal. There is no tenderness. There is no guarding.  Musculoskeletal: Normal range of motion. She exhibits no edema.  Neurological: She is alert and oriented to person, place, and time. She has normal reflexes. No cranial nerve deficit. Coordination normal.  Skin: Skin is warm and dry. No rash noted. No erythema. No pallor.  Psychiatric: She has a normal mood and affect. Judgment normal.    Complete Blood Count (Most recent): Lab Results  Component Value Date   WBC 7.3 07/03/2016   HGB 11.7 (L) 07/03/2016   HCT 34.8 (L) 07/03/2016   MCV 89.5 07/03/2016   PLT 323 07/03/2016   Chemistry (most recent): Lab Results  Component  Value Date   NA 140 07/19/2016   K 3.9 07/19/2016   CL 102 07/19/2016   CO2 25 07/19/2016   BUN 10 07/19/2016   CREATININE 0.56 07/19/2016   Diabetic Labs (most recent): Lab Results  Component Value Date   HGBA1C 10.7 (H) 07/19/2016   HGBA1C 11.6 (H) 07/01/2016   HGBA1C 8.9 (H) 03/26/2016    1) uncontrolled pancreatic diabetes 2) pancreatic insufficiency 3) vitamin D deficiency   Assessment & Plan:   1. Uncontrolled type 1 diabetes mellitus with complication [facility])  She Did have an episode of diabetic acidosis since last visit, is on [facility] -for continuous glucose monitoring. She did not bring her meter nor logs to review today.  -She remains at a high risk for more acute and chronic complications of diabetes which include CAD, CVA, CKD, retinopathy, and neuropathy. These are all discussed in detail with the patient. -Her  recent A1c is 10.7%, increasing from 8.9%, last visit. (During her hospitalization for DKA, her A1c was found to be 11.6%.)  -  Recent labs  reviewed.  - Suggestion is made for patient to avoid simple carbohydrates   from their diet including Cakes , Desserts, Ice Cream,  Soda (  diet and regular) , Sweet Tea , Candies,  Chips, Cookies, Artificial Sweeteners,   and "Sugar-free" Products .  This will help patient to have stable blood glucose profile and potentially avoid unintended  Weight gain.  - Patient is advised to stick to a routine mealtimes to eat 3 meals  a day and avoid unnecessary snacks ( to snack only to correct hypoglycemia).  - I have approached patient with the following individualized plan to manage diabetes and patient agrees.  - I will  resume and continue continue  Tresiba  30 units qhs, and continue  Novolog  15 units TIDAC for premeal BG readings of 90-153m/dl, plus patient specific sliding scale insulin for correction of unexpected hyperglycemia above 1522mdl, associated with strict monitoring of BG AC and HS.  -  I allowed her to cover meals with 5 units of NovoLog after a meal if her pre-meal blood glucose is between 70 and 90 mg/dL.  - She is losing weight which is not advisable for her. I urged her to allow more  carbs , specially complex carbs, to prevent unintentional weight loss. -She is a vegetarian, gave her more plant-based protein sources to diversify her protein intake.   -Adjustment parameters for hypo and hyperglycemia were given in a written document to patient. -Patient is encouraged to call clinic for blood glucose levels less than 70 or above 300 mg /dl.C plus SSI.  Her diabetes is due to yet unidentified hereditary pancreatitis. She is administratively classified as type 1 diabetes,  will be treated as a typical type 1 DM. -She is using DEXCOM. -She is not a candidate for metformin,SGLT2 inhibitors, and incretin therapy. -Recent thyroid function test where normal , her prior AM cortisol has been normal at 18. - Patient specific target  for A1c; LDL, HDL, Triglycerides, and  Waist  Circumference were discussed in detail. -Her ophthalm eval was negative, and due for another one.  2) Pancreatic Insufficiency: -she is advised to  increase Creon to 2 capsules of 24,000 units per meal and 1 tab per snack.  3) vitamin D deficiency: She is status post therapy with vitamin D 50,000 units weekly.  - Her vitamin D is 27. I advised her to start vitamin D3 5000 units daily for the next  90 days.   4) Chronic Care/Health Maintenance:  -Patient  Is encouraged to continue to follow up with Ophthalmology, Podiatrist at least yearly or according to recommendations, and advised to stay away from smoking. I have recommended yearly flu vaccine and pneumonia vaccination at least every 5 years; moderate intensity exercise for up to 150 minutes weekly; and  sleep for at least 7 hours a day. - I will obtain vitamin B12 and vitamin D before her next visit.  - 25 minutes of time was spent on the care of this patient , 50% of which was applied for counseling on diabetes complications and their preventions. - Her labs show vitamin  B12 above target at 1300, I advised her to avoid vitamin B12 supplement for now.   - I advised patient to maintain close follow up with Dr. [name]  for primary care needs. - She reports that she is getting married and possibly moving to another [place]. I advised her to identify an endocrinologist in her new area as soon as she relocates and we will forward her records accordingly.  Patient is asked to bring meter and  blood glucose logs during their next visit.   Follow up plan: -Return in 3 months (on 10/22/2016), or if symptoms worsen or fail to improve, for meter, and logs, follow up with pre-visit labs, meter, and logs.  Glade Lloyd, MD Phone: [phone]  Fax: [phone]   07/24/2016, 9:15 AM

## 2016-07-25 ENCOUNTER — Ambulatory Visit (INDEPENDENT_AMBULATORY_CARE_PROVIDER_SITE_OTHER): Payer: BC Managed Care – PPO | Admitting: Family Medicine

## 2016-07-25 ENCOUNTER — Encounter: Payer: Self-pay | Admitting: Family Medicine

## 2016-07-25 VITALS — BP 112/76 | Ht 62.0 in | Wt 113.0 lb

## 2016-07-25 DIAGNOSIS — G8929 Other chronic pain: Secondary | ICD-10-CM

## 2016-07-25 DIAGNOSIS — G43709 Chronic migraine without aura, not intractable, without status migrainosus: Secondary | ICD-10-CM | POA: Diagnosis not present

## 2016-07-25 DIAGNOSIS — R1013 Epigastric pain: Secondary | ICD-10-CM | POA: Diagnosis not present

## 2016-07-25 DIAGNOSIS — K219 Gastro-esophageal reflux disease without esophagitis: Secondary | ICD-10-CM | POA: Diagnosis not present

## 2016-07-25 DIAGNOSIS — K859 Acute pancreatitis without necrosis or infection, unspecified: Secondary | ICD-10-CM

## 2016-07-25 MED ORDER — OXYCODONE-ACETAMINOPHEN 10-325 MG PO TABS: ORAL_TABLET | ORAL | 0 refills | Status: DC

## 2016-07-25 MED ORDER — SUMATRIPTAN SUCCINATE 50 MG PO TABS: ORAL_TABLET | ORAL | 5 refills | Status: DC

## 2016-07-25 NOTE — Progress Notes (Signed)
   Subjective:    Patient ID: Linda Walls, female    DOB: September 21, 1990, 26 y.o.   MRN: [id number]  HPI This patient was seen today for chronic pain  The medication list was reviewed and updated.   -Compliance with medication: yes  - Number patient states they take daily: one half bid  -when was the last dose patient took?   The patient was advised the importance of maintaining medication and not using illegal substances with these.  Refills needed: yes  The patient was educated that we can provide 3 monthly scripts for their medication, it is their responsibility to follow the instructions.  Side effects or complications from medications: none  Patient is aware that pain medications are meant to minimize the severity of the pain to allow their pain levels to improve to allow for better function. They are aware of that pain medications cannot totally remove their pain.  /Due for UDT ( at least once per year) : 04/15/16 Phenergan works better  abd [place] about the same'  Oxy breaking in half  Oxycodone covering pain generally breaks. No obvious side effects. Ongoing chronic pain from pancreatitis.  Recent hospitalization reviewed. Hospital records reviewed in presence of family. Patient experienced gastritis and diabetic ketoacidosis.  Patient due to be married soon. Discussed        Review of Systems No headache, no major weight loss or weight gain, no chest pain no back pain abdominal pain no change in bowel habits complete ROS otherwise negative     Objective:   Physical Exam  Alert vitals stable, NAD. Blood pressure good on repeat. HEENT normal. Lungs clear. Heart regular rate and rhythm. Ankles without edema abdomen some epigastric tenderness to deep palp no rebound no guarding      Assessment & Plan:  Impression 1 chronic pain syndrome discussed use of meds discussed side effects benefits discussed patient maintain #2 chronic abdominal pain from  pancreatitis. Discussed #3 reflux clinically stable to maintain same meds #4 recent hospitalization reviewed plan 25 minutes spent. 50% in discussion and assessment of records and discussion medical conditions and management. Follow-up as scheduled patient notes she needs refill for migraine medication also. Migraine stable with medication. Not worsening

## 2016-08-02 ENCOUNTER — Other Ambulatory Visit: Payer: Self-pay | Admitting: Family Medicine

## 2016-08-19 ENCOUNTER — Encounter (HOSPITAL_COMMUNITY): Payer: Self-pay | Admitting: Emergency Medicine

## 2016-08-19 ENCOUNTER — Emergency Department (HOSPITAL_COMMUNITY): Payer: BC Managed Care – PPO

## 2016-08-19 ENCOUNTER — Inpatient Hospital Stay (HOSPITAL_COMMUNITY)
Admission: EM | Admit: 2016-08-19 | Discharge: 2016-08-25 | DRG: 439 | Disposition: A | Discharge status: Other Acute Inpatient Hospital | Payer: BC Managed Care – PPO | Attending: Internal Medicine | Admitting: Internal Medicine

## 2016-08-19 DIAGNOSIS — E871 Hypo-osmolality and hyponatremia: Secondary | ICD-10-CM

## 2016-08-19 DIAGNOSIS — Z794 Long term (current) use of insulin: Secondary | ICD-10-CM

## 2016-08-19 DIAGNOSIS — R112 Nausea with vomiting, unspecified: Secondary | ICD-10-CM

## 2016-08-19 DIAGNOSIS — E0865 Diabetes mellitus due to underlying condition with hyperglycemia: Secondary | ICD-10-CM

## 2016-08-19 DIAGNOSIS — K219 Gastro-esophageal reflux disease without esophagitis: Secondary | ICD-10-CM | POA: Diagnosis present

## 2016-08-19 DIAGNOSIS — D751 Secondary polycythemia: Secondary | ICD-10-CM

## 2016-08-19 DIAGNOSIS — G43909 Migraine, unspecified, not intractable, without status migrainosus: Secondary | ICD-10-CM | POA: Diagnosis present

## 2016-08-19 DIAGNOSIS — Z820 Family history of epilepsy and other diseases of the nervous system: Secondary | ICD-10-CM

## 2016-08-19 DIAGNOSIS — E1065 Type 1 diabetes mellitus with hyperglycemia: Secondary | ICD-10-CM | POA: Diagnosis present

## 2016-08-19 DIAGNOSIS — Z803 Family history of malignant neoplasm of breast: Secondary | ICD-10-CM | POA: Diagnosis not present

## 2016-08-19 DIAGNOSIS — K580 Irritable bowel syndrome with diarrhea: Secondary | ICD-10-CM | POA: Diagnosis present

## 2016-08-19 DIAGNOSIS — Z882 Allergy status to sulfonamides status: Secondary | ICD-10-CM | POA: Diagnosis not present

## 2016-08-19 DIAGNOSIS — R7989 Other specified abnormal findings of blood chemistry: Secondary | ICD-10-CM | POA: Diagnosis present

## 2016-08-19 DIAGNOSIS — Z8249 Family history of ischemic heart disease and other diseases of the circulatory system: Secondary | ICD-10-CM | POA: Diagnosis not present

## 2016-08-19 DIAGNOSIS — K8689 Other specified diseases of pancreas: Secondary | ICD-10-CM | POA: Diagnosis not present

## 2016-08-19 DIAGNOSIS — Z880 Allergy status to penicillin: Secondary | ICD-10-CM

## 2016-08-19 DIAGNOSIS — K8020 Calculus of gallbladder without cholecystitis without obstruction: Secondary | ICD-10-CM | POA: Diagnosis present

## 2016-08-19 DIAGNOSIS — E860 Dehydration: Secondary | ICD-10-CM | POA: Diagnosis present

## 2016-08-19 DIAGNOSIS — Z23 Encounter for immunization: Secondary | ICD-10-CM

## 2016-08-19 DIAGNOSIS — Z91013 Allergy to seafood: Secondary | ICD-10-CM

## 2016-08-19 DIAGNOSIS — D473 Essential (hemorrhagic) thrombocythemia: Secondary | ICD-10-CM | POA: Diagnosis present

## 2016-08-19 DIAGNOSIS — K861 Other chronic pancreatitis: Secondary | ICD-10-CM | POA: Diagnosis present

## 2016-08-19 DIAGNOSIS — Z881 Allergy status to other antibiotic agents status: Secondary | ICD-10-CM | POA: Diagnosis not present

## 2016-08-19 DIAGNOSIS — E10649 Type 1 diabetes mellitus with hypoglycemia without coma: Secondary | ICD-10-CM | POA: Diagnosis present

## 2016-08-19 DIAGNOSIS — E876 Hypokalemia: Secondary | ICD-10-CM | POA: Diagnosis not present

## 2016-08-19 DIAGNOSIS — IMO0002 Reserved for concepts with insufficient information to code with codable children: Secondary | ICD-10-CM | POA: Diagnosis present

## 2016-08-19 DIAGNOSIS — K859 Acute pancreatitis without necrosis or infection, unspecified: Principal | ICD-10-CM | POA: Diagnosis present

## 2016-08-19 DIAGNOSIS — D75839 Thrombocytosis, unspecified: Secondary | ICD-10-CM | POA: Diagnosis present

## 2016-08-19 LAB — CBC
HEMATOCRIT: 44.1 % (ref 36.0–46.0)
Hemoglobin: 15.5 g/dL — ABNORMAL HIGH (ref 12.0–15.0)
MCH: 30.5 pg (ref 26.0–34.0)
MCHC: 35.1 g/dL (ref 30.0–36.0)
MCV: 86.8 fL (ref 78.0–100.0)
Platelets: 493 10*3/uL — ABNORMAL HIGH (ref 150–400)
RBC: 5.08 MIL/uL (ref 3.87–5.11)
RDW: 12.6 % (ref 11.5–15.5)
WBC: 14.8 10*3/uL — ABNORMAL HIGH (ref 4.0–10.5)

## 2016-08-19 LAB — LIPASE, BLOOD

## 2016-08-19 LAB — HCG, QUANTITATIVE, PREGNANCY: hCG, Beta Chain, Quant, S: 2 m[IU]/mL (ref <5)

## 2016-08-19 LAB — COMPREHENSIVE METABOLIC PANEL
ALBUMIN: 5.1 g/dL — AB (ref 3.5–5.0)
ALT: 19 U/L (ref 14–54)
AST: 29 U/L (ref 15–41)
Alkaline Phosphatase: 101 U/L (ref 38–126)
Anion gap: 15 (ref 5–15)
BUN: 28 mg/dL — ABNORMAL HIGH (ref 6–20)
CHLORIDE: 96 mmol/L — AB (ref 101–111)
CO2: 22 mmol/L (ref 22–32)
Calcium: 10.4 mg/dL — ABNORMAL HIGH (ref 8.9–10.3)
Creatinine, Ser: 0.74 mg/dL (ref 0.44–1.00)
GFR calc Af Amer: >60 mL/min (ref >60)
GFR calc non Af Amer: >60 mL/min (ref >60)
GLUCOSE: 326 mg/dL — AB (ref 65–99)
POTASSIUM: 4.4 mmol/L (ref 3.5–5.1)
Sodium: 133 mmol/L — ABNORMAL LOW (ref 135–145)
Total Bilirubin: 1.2 mg/dL (ref 0.3–1.2)
Total Protein: 9.3 g/dL — ABNORMAL HIGH (ref 6.5–8.1)

## 2016-08-19 LAB — GLUCOSE, CAPILLARY: Glucose-Capillary: 286 mg/dL — ABNORMAL HIGH (ref 65–99)

## 2016-08-19 LAB — CBG MONITORING, ED: Glucose-Capillary: 242 mg/dL — ABNORMAL HIGH (ref 65–99)

## 2016-08-19 MED ORDER — FENTANYL CITRATE (PF) 100 MCG/2ML IJ SOLN
25.0000 ug | INTRAMUSCULAR | Status: DC | PRN
  Administered 2016-08-19 – 2016-08-25 (×45): 50 ug via INTRAVENOUS
  Filled 2016-08-19 (×47): qty 2

## 2016-08-19 MED ORDER — PANCRELIPASE (LIP-PROT-AMYL) 12000-38000 UNITS PO CPEP
24000.0000 [IU] | ORAL_CAPSULE | Freq: Three times a day (TID) | ORAL | Status: DC
  Administered 2016-08-20 – 2016-08-22 (×5): 24000 [IU] via ORAL
  Filled 2016-08-19 (×6): qty 2

## 2016-08-19 MED ORDER — ONDANSETRON HCL 4 MG/2ML IJ SOLN
4.0000 mg | Freq: Four times a day (QID) | INTRAMUSCULAR | Status: DC | PRN
  Administered 2016-08-20 – 2016-08-24 (×10): 4 mg via INTRAVENOUS
  Filled 2016-08-19 (×12): qty 2

## 2016-08-19 MED ORDER — VITAMIN D 1000 UNITS PO TABS
5000.0000 [IU] | ORAL_TABLET | Freq: Every day | ORAL | Status: DC
  Administered 2016-08-20 – 2016-08-25 (×5): 5000 [IU] via ORAL
  Filled 2016-08-19 (×6): qty 5

## 2016-08-19 MED ORDER — INSULIN ASPART 100 UNIT/ML ~~LOC~~ SOLN
0.0000 [IU] | SUBCUTANEOUS | Status: DC
  Administered 2016-08-20: 8 [IU] via SUBCUTANEOUS
  Administered 2016-08-20: 5 [IU] via SUBCUTANEOUS
  Administered 2016-08-20 – 2016-08-21 (×2): 8 [IU] via SUBCUTANEOUS

## 2016-08-19 MED ORDER — PANTOPRAZOLE SODIUM 40 MG PO TBEC
40.0000 mg | DELAYED_RELEASE_TABLET | Freq: Every day | ORAL | Status: DC
  Administered 2016-08-20 – 2016-08-25 (×5): 40 mg via ORAL
  Filled 2016-08-19 (×6): qty 1

## 2016-08-19 MED ORDER — ONDANSETRON HCL 4 MG PO TABS: 4.0000 mg | ORAL_TABLET | Freq: Four times a day (QID) | ORAL | Status: DC | PRN

## 2016-08-19 MED ORDER — ONDANSETRON HCL 4 MG/2ML IJ SOLN
4.0000 mg | Freq: Once | INTRAMUSCULAR | Status: AC
  Administered 2016-08-19: 4 mg via INTRAVENOUS

## 2016-08-19 MED ORDER — PROMETHAZINE HCL 25 MG/ML IJ SOLN
25.0000 mg | Freq: Four times a day (QID) | INTRAMUSCULAR | Status: DC | PRN
  Administered 2016-08-20 – 2016-08-25 (×14): 25 mg via INTRAVENOUS
  Filled 2016-08-19 (×16): qty 1

## 2016-08-19 MED ORDER — FAMOTIDINE IN NACL 20-0.9 MG/50ML-% IV SOLN
20.0000 mg | Freq: Two times a day (BID) | INTRAVENOUS | Status: DC
  Administered 2016-08-19 – 2016-08-20 (×3): 20 mg via INTRAVENOUS
  Filled 2016-08-19 (×4): qty 50

## 2016-08-19 MED ORDER — ACETAMINOPHEN 325 MG PO TABS: 650.0000 mg | ORAL_TABLET | Freq: Four times a day (QID) | ORAL | Status: DC | PRN

## 2016-08-19 MED ORDER — SODIUM CHLORIDE 0.9 % IV BOLUS (SEPSIS): 1000.0000 mL | Freq: Once | INTRAVENOUS | Status: DC

## 2016-08-19 MED ORDER — IOPAMIDOL (ISOVUE-300) INJECTION 61%
INTRAVENOUS | Status: AC
  Filled 2016-08-19: qty 30

## 2016-08-19 MED ORDER — MORPHINE SULFATE (PF) 2 MG/ML IV SOLN
2.0000 mg | Freq: Once | INTRAVENOUS | Status: AC
  Administered 2016-08-19: 2 mg via INTRAVENOUS
  Filled 2016-08-19: qty 1

## 2016-08-19 MED ORDER — PNEUMOCOCCAL VAC POLYVALENT 25 MCG/0.5ML IJ INJ
0.5000 mL | INJECTION | INTRAMUSCULAR | Status: AC
  Administered 2016-08-20: 0.5 mL via INTRAMUSCULAR
  Filled 2016-08-19: qty 0.5

## 2016-08-19 MED ORDER — IOPAMIDOL (ISOVUE-300) INJECTION 61%
100.0000 mL | Freq: Once | INTRAVENOUS | Status: AC | PRN
  Administered 2016-08-19: 100 mL via INTRAVENOUS

## 2016-08-19 MED ORDER — SODIUM CHLORIDE 0.9 % IV SOLN
Freq: Once | INTRAVENOUS | Status: AC
  Administered 2016-08-19: 19:00:00 via INTRAVENOUS

## 2016-08-19 MED ORDER — SODIUM CHLORIDE 0.9 % IV SOLN
INTRAVENOUS | Status: AC
  Administered 2016-08-19: via INTRAVENOUS

## 2016-08-19 MED ORDER — SODIUM CHLORIDE 0.9 % IV BOLUS (SEPSIS)
2000.0000 mL | Freq: Once | INTRAVENOUS | Status: AC
  Administered 2016-08-19: 1000 mL via INTRAVENOUS

## 2016-08-19 MED ORDER — INSULIN GLARGINE 100 UNIT/ML ~~LOC~~ SOLN
30.0000 [IU] | Freq: Every day | SUBCUTANEOUS | Status: DC
  Administered 2016-08-19: 30 [IU] via SUBCUTANEOUS
  Filled 2016-08-19 (×3): qty 0.3

## 2016-08-19 MED ORDER — ONDANSETRON HCL 4 MG/2ML IJ SOLN
INTRAMUSCULAR | Status: AC
  Filled 2016-08-19: qty 2

## 2016-08-19 MED ORDER — ACETAMINOPHEN 650 MG RE SUPP: 650.0000 mg | Freq: Four times a day (QID) | RECTAL | Status: DC | PRN

## 2016-08-19 MED ORDER — MORPHINE SULFATE (PF) 4 MG/ML IV SOLN
4.0000 mg | Freq: Once | INTRAVENOUS | Status: AC
  Administered 2016-08-19: 4 mg via INTRAVENOUS
  Filled 2016-08-19: qty 1

## 2016-08-19 MED ORDER — ONDANSETRON HCL 4 MG/2ML IJ SOLN
4.0000 mg | Freq: Once | INTRAMUSCULAR | Status: AC
  Administered 2016-08-19: 4 mg via INTRAVENOUS
  Filled 2016-08-19: qty 2

## 2016-08-19 NOTE — ED Triage Notes (Signed)
Patient complains of nausea, vomiting, diarrhea that started yesterday.

## 2016-08-19 NOTE — H&P (Signed)
History and Physical    Linda Walls [id number] DOB: 06-16-1991 DOA: 08/19/2016  PCP: [name], MD   Patient coming from: Home  Chief Complaint: Abdominal pain with N/V/D  HPI: Linda Walls is a 26 y.o. female with medical history significant for hereditary pancreatitis with pancreatic insufficiency and secondary type 1 diabetes mellitus, GERD, and migraines who presents to the emergency department with abdominal pain, nausea, vomiting, and diarrhea since yesterday. Patient reports she was in her usual state of health until yesterday when she noted the insidious development of pain in the left side of her abdomen with nausea. She then developed nonbloody nonbilious vomiting and loose stools. Over the ensuing day, pain continued to worsen and she has continued to vomit. Pain is described as moderate to severe in intensity, sharp in character, localized to the left abdomen, associated with nausea and vomiting, and with no alleviating or exacerbating factors identified. Patient has not attempted to eat anything since her symptoms began and has vomited with attempts at drinking. Patient denies any fevers or chills, cough or dyspnea, chest pains or palpitations.  She reports these symptoms to be very similar to her prior experiences with acute on chronic pancreatitis.  ED Course: Upon arrival to the ED, patient is found to be afebrile, saturating well on room air, tachycardic to 110, and with vitals otherwise stable. Chemistry panels notable for hyponatremia and elevated BUN to creatinine ratio. Serum glucose is 326. CBC features a polycythemia with WBC 14,800, hemoglobin of 15.5, and thrombocytosis to 493,000. Lipases undetectably low. CT of the abdomen and pelvis was obtained and is most notable for severe dilation of the major pancreatic duct without CBD dilation, concerning for a possible ductal pancreatic neoplasm versus a downstream obstruction. Patient was given 4 L of normal saline  in total in the emergency department, 2 doses of IV morphine, and 2 doses of IV Zofran. Gastroenterology was consulted by the ED physician and advised a medical admission with further evaluation by MRCP, ongoing control of pain and nausea, and with plan for the consultant to evaluate the patient in the morning. Pain and nausea have improved with the measures taken in the ED and the tachycardia has resolved. She will be admitted to the medical/surgical unit for ongoing evaluation and management of apparent obstruction of the major pancreatic duct with nausea, vomiting, diarrhea, and abdominal pain.  Review of Systems:  All other systems reviewed and apart from HPI, are negative.  Past Medical History:  Diagnosis Date  . DKA (diabetic ketoacidoses) ([place])   . DM type 1 (diabetes mellitus, type 1) ([place])   . GERD (gastroesophageal reflux disease)   . Hereditary pancreatitis   . Hernia   . IBS (irritable bowel syndrome)   . Jaundice   . Migraines     Past Surgical History:  Procedure Laterality Date  . BILE DUCT STENT PLACEMENT  2006 at [place] 6 months after it was placed.  Marland Kitchen HERNIA REPAIR     As infant.  Marland Kitchen PANCREATIC CYST DRAINAGE  at age 40 year     reports that she has never smoked. She has never used smokeless tobacco. She reports that she does not drink alcohol or use drugs.  Allergies  Allergen Reactions  . Shellfish Allergy Nausea And Vomiting  . Cefzil [Cefprozil] Rash  . Penicillins Rash    Has patient had a PCN reaction causing immediate rash, facial/tongue/throat swelling, SOB or lightheadedness with hypotension: NO Has patient had a PCN  reaction causing severe rash involving mucus membranes or skin necrosis: NO Has patient had a PCN reaction that required hospitalization: NO Has patient had a PCN reaction occurring within the last 10 years: NO If all of the above answers are "NO", then may proceed with Cephalosporin use.   . Sulfa Antibiotics Rash    Family  History  Problem Relation Age of Onset  . Pancreatitis Father 30    hereditary  . Hypertension Father   . GER disease Father   . Hypothyroidism Mother   . Breast cancer Maternal Aunt   . Congenital heart disease Maternal Grandfather   . Prostate cancer Maternal Grandfather   . Alzheimer's disease Maternal Grandfather      Prior to Admission medications   Medication Sig Start Date End Date Taking? Authorizing Provider  Cholecalciferol (VITAMIN D3) 5000 units CAPS Take 1 capsule (5,000 Units total) by mouth daily. 04/03/16  Yes [name], MD  CREON 24000-76000 units CPEP Use 2 caps with meals and 1 capsule with snacks upto 9 capsules a day Patient taking differently: Take 1-2 capsules by mouth 3 (three) times daily. Use 2 caps with meals and 1 capsule with snacks upto 9 capsules a day 04/03/16  Yes [name], MD  ferrous sulfate 325 (65 FE) MG tablet Take 325 mg by mouth daily with breakfast.   Yes Historical Provider, MD  GLUCAGON EMERGENCY 1 MG injection Inject 1 mg into the muscle once as needed. 04/03/16  Yes [name], MD  hyoscyamine (LEVSIN SL) 0.125 MG SL tablet PLACE 1 TABLET UNDER THE TONGUE EVERY 4 HOURS AS NEEDED FOR PAIN. 09/17/15  Yes [name], MD  insulin aspart (NOVOLOG) 100 UNIT/ML FlexPen Inject 15-21 Units into the skin 3 (three) times daily with meals. 01/04/16  Yes [name], MD  omeprazole (PRILOSEC) 20 MG capsule TAKE ONE CAPSULE BY MOUTH DAILY AS NEEDED FOR ACID REFLUX. 08/04/16  Yes [name], MD  oxyCODONE-acetaminophen (PERCOCET) 10-325 MG tablet Take 0.5-1 tablets by mouth 2 (two) times daily as needed for pain.  07/25/16  Yes [name], MD  promethazine (PHENERGAN) 25 MG suppository Place 25 mg rectally every 6 (six) hours as needed for nausea or vomiting.   Yes Historical Provider, MD  promethazine (PHENERGAN) 25 MG tablet Take 1 tablet (25 mg total) by mouth every 6 (six) hours as needed. for nausea 02/20/16   Yes [name], MD  SUMAtriptan (IMITREX) 50 MG tablet TAKE 1 TABLET BY MOUTH ONCE. MAY REPEAT IN 2 HOURS IF HEADACHE PERSISTS OR RECURS. Patient taking differently: Take 50 mg by mouth every 2 (two) hours as needed for migraine or headache. TAKE 1 TABLET BY MOUTH ONCE. MAY REPEAT IN 2 HOURS IF HEADACHE PERSISTS OR RECURS. 07/25/16  Yes [name], MD  TRESIBA FLEXTOUCH 100 UNIT/ML SOPN INJECT 30 UNITS SUBCUTANEOUSLY AT BEDTIME. 03/04/16  Yes [name], MD  vitamin C (ASCORBIC ACID) 500 MG tablet Take 500 mg by mouth daily.   Yes Historical Provider, MD  Insulin Pen Needle (BD PEN NEEDLE NANO U/F) 32G X 4 MM MISC 1 each by Does not apply route 4 (four) times daily. 12/27/15   [name], MD  [facility] DELICA LANCETS [id number] MISC Use to test glucose up to 6 times a day 12/27/15   [name], MD    Physical Exam: Vitals:   08/19/16 1815 08/19/16 2000 08/19/16 2041 08/19/16 2100  BP: 123/81 118/77 118/90 123/77  Pulse: 91 88 97 99  Resp: 20 19 18 17   Temp: 99.7 F (37.6 C)     TempSrc: Oral     SpO2: 99% 100% 100% 100%  Weight:      Height:          Constitutional: NAD, calm, appears uncomfortable Eyes: PERTLA, lids and conjunctivae normal ENMT: Mucous membranes are moist. Posterior pharynx clear of any exudate or lesions.   Neck: normal, supple, no masses, no thyromegaly Respiratory: clear to auscultation bilaterally, no wheezing, no crackles. Normal respiratory effort.   Cardiovascular: Rate ~110 and regular without appreciable murmur. No extremity edema. No significant JVD. Abdomen: No distension. Tender, mostly in epigastrium, just left of center. No rebound pain or guarding, no masses palpated. Bowel sounds normal.  Musculoskeletal: no clubbing / cyanosis. No joint deformity upper and lower extremities. Normal muscle tone.  Skin: no significant rashes, lesions, ulcers. Warm, dry, well-perfused. Neurologic: CN 2-12 grossly intact. Sensation intact,  DTR normal. Strength 5/5 in all 4 limbs.  Psychiatric: Normal judgment and insight. Alert and oriented x 3. Normal mood and affect.     Labs on Admission: I have personally reviewed following labs and imaging studies  CBC:  Recent Labs Lab 08/19/16 1447  WBC 14.8*  HGB 15.5*  HCT 44.1  MCV 86.8  PLT [id number]*   Basic Metabolic Panel:  Recent Labs Lab 08/19/16 1447  NA 133*  K 4.4  CL 96*  CO2 22  GLUCOSE 326*  BUN 28*  CREATININE 0.74  CALCIUM 10.4*   GFR: Estimated Creatinine Clearance: 82.2 mL/min (by C-G formula based on SCr of 0.74 mg/dL). Liver Function Tests:  Recent Labs Lab 08/19/16 1447  AST 29  ALT 19  ALKPHOS 101  BILITOT 1.2  PROT 9.3*  ALBUMIN 5.1*    Recent Labs Lab 08/19/16 1447  LIPASE <10*   No results for input(s): AMMONIA in the last 168 hours. Coagulation Profile: No results for input(s): INR, PROTIME in the last 168 hours. Cardiac Enzymes: No results for input(s): CKTOTAL, CKMB, CKMBINDEX, TROPONINI in the last 168 hours. BNP (last 3 results) No results for input(s): PROBNP in the last 8760 hours. HbA1C: No results for input(s): HGBA1C in the last 72 hours. CBG:  Recent Labs Lab 08/19/16 2153  GLUCAP 242*   Lipid Profile: No results for input(s): CHOL, HDL, LDLCALC, TRIG, CHOLHDL, LDLDIRECT in the last 72 hours. Thyroid Function Tests: No results for input(s): TSH, T4TOTAL, FREET4, T3FREE, THYROIDAB in the last 72 hours. Anemia Panel: No results for input(s): VITAMINB12, FOLATE, FERRITIN, TIBC, IRON, RETICCTPCT in the last 72 hours. Urine analysis:    Component Value Date/Time   COLORURINE YELLOW 07/01/2016 0345   APPEARANCEUR CLEAR 07/01/2016 0345   LABSPEC 1.020 07/01/2016 0345   PHURINE 5.5 07/01/2016 0345   GLUCOSEU >=500 (A) 07/01/2016 0345   HGBUR NEGATIVE 07/01/2016 0345   BILIRUBINUR NEGATIVE 07/01/2016 0345   KETONESUR >80 (A) 07/01/2016 0345   PROTEINUR NEGATIVE 07/01/2016 0345   UROBILINOGEN 0.2  05/06/2015 0045   NITRITE NEGATIVE 07/01/2016 0345   LEUKOCYTESUR NEGATIVE 07/01/2016 0345   Sepsis Labs: @LABRCNTIP (procalcitonin:4,lacticidven:4) )No results found for this or any previous visit (from the past 240 hour(s)).   Radiological Exams on Admission: Ct Abdomen Pelvis W Contrast  Result Date: 08/19/2016 CLINICAL DATA:  26 y/o F; left lower quadrant pain, nausea, vomiting, and diarrhea. EXAM: CT ABDOMEN AND PELVIS WITH CONTRAST TECHNIQUE: Multidetector CT imaging of the abdomen and pelvis was performed using the standard protocol following  bolus administration of intravenous contrast. CONTRAST:  162mL ISOVUE-300 IOPAMIDOL (ISOVUE-300) INJECTION 61% COMPARISON:  05/05/2015 CT of the abdomen and pelvis. FINDINGS: Lower chest: No acute abnormality. Hepatobiliary: No focal liver abnormality is seen. No gallstones, gallbladder wall thickening, or biliary dilatation. Pancreas: Severe dilatation of the main pancreatic duct to 11 mm. No obstructing lesion is identified. Spleen: Normal in size without focal abnormality. Adrenals/Urinary Tract: Adrenal glands are unremarkable. Kidneys are normal, without renal calculi, focal lesion, or hydronephrosis. Bladder is unremarkable. Stomach/Bowel: Stomach is within normal limits. Appendix not identified, no secondary signs of appendicitis. No evidence of bowel wall thickening, distention, or inflammatory changes. Vascular/Lymphatic: No significant vascular findings are present. No enlarged abdominal or pelvic lymph nodes. Reproductive: Uterus and bilateral adnexa are unremarkable. Other: No abdominal wall hernia or abnormality. No abdominopelvic ascites. Musculoskeletal: No acute or significant osseous findings. IMPRESSION: Severe dilatation of the main pancreatic duct without common bile duct dilatation. This may represent a ductal pancreatic neoplasm or downstream obstruction. MRI/ MRCP of the abdomen with and without intravenous contrast is recommended for  further characterization. Electronically Signed   By: Kristine Garbe M.D.   On: 08/19/2016 21:22    EKG: Not performed, will obtain as appropriate.   Assessment/Plan  1. Pancreatic duct obstruction, abd pain, N/V - Pt presents with 1 day of abd pain, N/V/D, describing sxs as similar to her prior experiences with acute pancreatitis  - She is markedly dehydrated on arrival and has been fluid-resuscitated with 4 liters NS in ED  - CT abd/pelvis features severe dilation of main pancreatic duct without CBD dilation; MRCP advised for further eval - Pt gives a hx of cholelithiasis (none seen on admission CT), and reports having a stent placed to pancreatic duct ~11 or 12 years ago  - GI is consulting and much appreciated; will continue analgesia, hydration, anti-emetics, order MRCP, and follow-up further recs    2. Hereditary pancreatitis  - Continue Creon; addressing acute component as above with IVF, analgesia, anti-emetics    3. Type I DM  - Secondary to hereditary pancreatitis - A1c was 10.7% in January 2018  - She follows with endocrinology and is managed with Tresiba 30 units qHS and Novolog 15-21 units TID per sliding-scale  - Plan to check CBG q4h for now until she is appropriate for a diet  - Start with Lantus 30 units qHS and Novolog q4h per moderate-intensity sliding-scale    4. GERD - No EGD report available; she had EUS at [facility] in January 2017, but report not visible   - Managed with daily Prilosec at home, will continue daily PPI, add IV Pepcid BID for now given N/V   5. Hyponatremia  - Serum sodium 133 on admission in the setting of N/V/D with hypovolemia  - She has been fluid-resuscitated in ED with 4 liters NS and is continued on NS infusion  - Follow fluid-status, repeat chemistries in am   6. Polycythemia  - Mild polycythemia noted on admission, likely reflects hemoconcentration or reactive polycythemia  - Repeat CBC in am     DVT prophylaxis: SCD's  Code  Status: Full  Family Communication: Mother updated at bedside with patient's permission Disposition Plan: Admit to med-surg Consults called: Gastroenterology Admission status: Inpatient    Vianne Bulls, MD [facility] Pager [phone]  If 7PM-7AM, please contact night-coverage [email or web address] Password [email or web address]  08/19/2016, 10:14 PM

## 2016-08-19 NOTE — ED Provider Notes (Signed)
[facility] DEPT Provider Note   CSN: [id number] Arrival date & time: 08/19/16  1344     History   Chief Complaint Chief Complaint  Patient presents with  . Nausea  . Fever  . Emesis    HPI Linda Walls is a 26 y.o. female.  Patient complains of vomiting diarrhea and some abdominal cramping for 2 days. She has a history of hereditary pancreatitis   The history is provided by the patient. No language interpreter was used.  Emesis   This is a new problem. The current episode started 2 days ago. The problem occurs 5 to 10 times per day. The problem has not changed since onset.The emesis has an appearance of stomach contents. There has been no fever. Associated symptoms include abdominal pain and diarrhea. Pertinent negatives include no chills, no cough and no headaches.    Past Medical History:  Diagnosis Date  . DKA (diabetic ketoacidoses) ([place])   . DM type 1 (diabetes mellitus, type 1) ([place])   . GERD (gastroesophageal reflux disease)   . Hereditary pancreatitis   . Hernia   . IBS (irritable bowel syndrome)   . Jaundice   . Migraines     Patient Active Problem List   Diagnosis Date Noted  . 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 Pancreatitis 01/02/2016  . Uncontrolled diabetes mellitus secondary to pancreatic insufficiency ([place]) 06/06/2015  . 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 Pancreatic insufficiency 05/23/2015  . Vitamin D deficiency 05/23/2015  . DKA (diabetic ketoacidoses) ([place]) 05/06/2015  . Intractable nausea and vomiting 04/16/2015  . Abdominal pain, left lower quadrant 04/16/2015  . Leukocytosis 04/16/2015  . Diabetic ketoacidosis without coma associated with type 1 diabetes mellitus ([place])   . Migraine headache 10/27/2014  . Abdominal pain, chronic, epigastric 08/15/2013  . Pyelonephritis 03/06/2013  . Acute gastritis 12/23/2012  . Nausea and vomiting 05/01/2012  . Hypoglycemia associated with diabetes ([place]) 05/01/2012  . GERD  (gastroesophageal reflux disease) 04/29/2012  . Anemia 05/19/2011  . Hereditary pancreatitis 05/18/2011  . Hyponatremia 05/18/2011  . Hypokalemia 05/18/2011  . Sinus tachycardia ([place]) 05/18/2011  . Dehydration 05/18/2011  . Thrombocytosis ([place]) 05/18/2011    Past Surgical History:  Procedure Laterality Date  . BILE DUCT STENT PLACEMENT  2006 at [place] 6 months after it was placed.  Marland Kitchen HERNIA REPAIR     As infant.  Marland Kitchen PANCREATIC CYST DRAINAGE  at age 43 year    OB History    No data available       Home Medications    Prior to Admission medications   Medication Sig Start Date End Date Taking? Authorizing Provider  Cholecalciferol (VITAMIN D3) 5000 units CAPS Take 1 capsule (5,000 Units total) by mouth daily. 04/03/16  Yes [name], MD  CREON 24000-76000 units CPEP Use 2 caps with meals and 1 capsule with snacks upto 9 capsules a day Patient taking differently: Take 1-2 capsules by mouth 3 (three) times daily. Use 2 caps with meals and 1 capsule with snacks upto 9 capsules a day 04/03/16  Yes [name], MD  ferrous sulfate 325 (65 FE) MG tablet Take 325 mg by mouth daily with breakfast.   Yes Historical Provider, MD  GLUCAGON EMERGENCY 1 MG injection Inject 1 mg into the muscle once as needed. 04/03/16  Yes [name], MD  hyoscyamine (LEVSIN SL) 0.125 MG SL tablet PLACE 1 TABLET UNDER THE TONGUE EVERY 4 HOURS AS NEEDED FOR PAIN. 09/17/15  Yes  [name], MD  insulin aspart (NOVOLOG) 100 UNIT/ML FlexPen Inject 15-21 Units into the skin 3 (three) times daily with meals. 01/04/16  Yes [name], MD  omeprazole (PRILOSEC) 20 MG capsule TAKE ONE CAPSULE BY MOUTH DAILY AS NEEDED FOR ACID REFLUX. 08/04/16  Yes [name], MD  oxyCODONE-acetaminophen (PERCOCET) 10-325 MG tablet Take 0.5-1 tablets by mouth 2 (two) times daily as needed for pain.  07/25/16  Yes [name], MD  promethazine (PHENERGAN) 25 MG suppository Place 25 mg  rectally every 6 (six) hours as needed for nausea or vomiting.   Yes Historical Provider, MD  promethazine (PHENERGAN) 25 MG tablet Take 1 tablet (25 mg total) by mouth every 6 (six) hours as needed. for nausea 02/20/16  Yes [name], MD  SUMAtriptan (IMITREX) 50 MG tablet TAKE 1 TABLET BY MOUTH ONCE. MAY REPEAT IN 2 HOURS IF HEADACHE PERSISTS OR RECURS. Patient taking differently: Take 50 mg by mouth every 2 (two) hours as needed for migraine or headache. TAKE 1 TABLET BY MOUTH ONCE. MAY REPEAT IN 2 HOURS IF HEADACHE PERSISTS OR RECURS. 07/25/16  Yes [name], MD  TRESIBA FLEXTOUCH 100 UNIT/ML SOPN INJECT 30 UNITS SUBCUTANEOUSLY AT BEDTIME. 03/04/16  Yes [name], MD  vitamin C (ASCORBIC ACID) 500 MG tablet Take 500 mg by mouth daily.   Yes Historical Provider, MD  Insulin Pen Needle (BD PEN NEEDLE NANO U/F) 32G X 4 MM MISC 1 each by Does not apply route 4 (four) times daily. 12/27/15   [name], MD  [facility] DELICA LANCETS [id number] MISC Use to test glucose up to 6 times a day 12/27/15   [name], MD    Family History Family History  Problem Relation Age of Onset  . Pancreatitis Father 30    hereditary  . Hypertension Father   . GER disease Father   . Hypothyroidism Mother   . Breast cancer Maternal Aunt   . Congenital heart disease Maternal Grandfather   . Prostate cancer Maternal Grandfather   . Alzheimer's disease Maternal Grandfather     Social History Social History  Substance Use Topics  . Smoking status: Never Smoker  . Smokeless tobacco: Never Used  . Alcohol use No     Allergies   Shellfish allergy; Cefzil [cefprozil]; Penicillins; and Sulfa antibiotics   Review of Systems Review of Systems  Constitutional: Negative for appetite change, chills and fatigue.  HENT: Negative for congestion, ear discharge and sinus pressure.   Eyes: Negative for discharge.  Respiratory: Negative for cough.   Cardiovascular: Negative for chest  pain.  Gastrointestinal: Positive for abdominal pain, diarrhea and vomiting.  Genitourinary: Negative for frequency and hematuria.  Musculoskeletal: Negative for back pain.  Skin: Negative for rash.  Neurological: Negative for seizures and headaches.  Psychiatric/Behavioral: Negative for hallucinations.     Physical Exam Updated Vital Signs BP 123/77   Pulse 99   Temp 99.7 F (37.6 C) (Oral)   Resp 17   Ht 5\' 2"  (1.575 m)   Wt 105 lb (47.6 kg)   LMP 07/29/2016   SpO2 100%   BMI 19.20 kg/m   Physical Exam  Constitutional: She is oriented to person, place, and time. She appears well-developed.  HENT:  Head: Normocephalic.  Eyes: Conjunctivae and EOM are normal. No scleral icterus.  Neck: Neck supple. No thyromegaly present.  Cardiovascular: Normal rate and regular rhythm.  Exam reveals no gallop and no friction rub.  No murmur heard. Pulmonary/Chest: No stridor. She has no wheezes. She has no rales. She exhibits no tenderness.  Abdominal: She exhibits no distension. There is tenderness. There is no rebound.  Tender left lower quadrant.  Musculoskeletal: Normal range of motion. She exhibits no edema.  Lymphadenopathy:    She has no cervical adenopathy.  Neurological: She is oriented to person, place, and time. She exhibits normal muscle tone. Coordination normal.  Skin: No rash noted. No erythema.  Psychiatric: She has a normal mood and affect. Her behavior is normal.     ED Treatments / Results  Labs (all labs ordered are listed, but only abnormal results are displayed) Labs Reviewed  LIPASE, BLOOD - Abnormal; Notable for the following:       Result Value   Lipase <10 (*)    All other components within normal limits  COMPREHENSIVE METABOLIC PANEL - Abnormal; Notable for the following:    Sodium 133 (*)    Chloride 96 (*)    Glucose, Bld 326 (*)    BUN 28 (*)    Calcium 10.4 (*)    Total Protein 9.3 (*)    Albumin 5.1 (*)    All other components within normal  limits  CBC - Abnormal; Notable for the following:    WBC 14.8 (*)    Hemoglobin 15.5 (*)    Platelets 493 (*)    All other components within normal limits  HCG, QUANTITATIVE, PREGNANCY  URINALYSIS, ROUTINE W REFLEX MICROSCOPIC  CBG MONITORING, ED    EKG  EKG Interpretation None       Radiology Ct Abdomen Pelvis W Contrast  Result Date: 08/19/2016 CLINICAL DATA:  26 y/o F; left lower quadrant pain, nausea, vomiting, and diarrhea. EXAM: CT ABDOMEN AND PELVIS WITH CONTRAST TECHNIQUE: Multidetector CT imaging of the abdomen and pelvis was performed using the standard protocol following bolus administration of intravenous contrast. CONTRAST:  169mL ISOVUE-300 IOPAMIDOL (ISOVUE-300) INJECTION 61% COMPARISON:  05/05/2015 CT of the abdomen and pelvis. FINDINGS: Lower chest: No acute abnormality. Hepatobiliary: No focal liver abnormality is seen. No gallstones, gallbladder wall thickening, or biliary dilatation. Pancreas: Severe dilatation of the main pancreatic duct to 11 mm. No obstructing lesion is identified. Spleen: Normal in size without focal abnormality. Adrenals/Urinary Tract: Adrenal glands are unremarkable. Kidneys are normal, without renal calculi, focal lesion, or hydronephrosis. Bladder is unremarkable. Stomach/Bowel: Stomach is within normal limits. Appendix not identified, no secondary signs of appendicitis. No evidence of bowel wall thickening, distention, or inflammatory changes. Vascular/Lymphatic: No significant vascular findings are present. No enlarged abdominal or pelvic lymph nodes. Reproductive: Uterus and bilateral adnexa are unremarkable. Other: No abdominal wall hernia or abnormality. No abdominopelvic ascites. Musculoskeletal: No acute or significant osseous findings. IMPRESSION: Severe dilatation of the main pancreatic duct without common bile duct dilatation. This may represent a ductal pancreatic neoplasm or downstream obstruction. MRI/ MRCP of the abdomen with and  without intravenous contrast is recommended for further characterization. Electronically Signed   By: Kristine Garbe M.D.   On: 08/19/2016 21:22    Procedures Procedures (including critical care time)  Medications Ordered in ED Medications  iopamidol (ISOVUE-300) 61 % injection (not administered)  sodium chloride 0.9 % bolus 1,000 mL (not administered)  0.9 %  sodium chloride infusion ( Intravenous Stopped 08/19/16 2017)  ondansetron (ZOFRAN) injection 4 mg (4 mg Intravenous Given 08/19/16 1835)  sodium chloride 0.9 % bolus 2,000 mL (1,000 mLs Intravenous New Bag/Given 08/19/16 2017)  morphine 2 MG/ML injection 2 mg (  2 mg Intravenous Given 08/19/16 1944)  ondansetron (ZOFRAN) injection 4 mg (4 mg Intravenous Given 08/19/16 2031)  iopamidol (ISOVUE-300) 61 % injection 100 mL (100 mLs Intravenous Contrast Given 08/19/16 2050)  morphine 4 MG/ML injection 4 mg (4 mg Intravenous Given 08/19/16 2113)     Initial Impression / Assessment and Plan / ED Course  I have reviewed the triage vital signs and the nursing notes.  Pertinent labs & imaging results that were available during my care of the patient were reviewed by me and considered in my medical decision making (see chart for details).     Patient with dehydration elevated sugar. Patient not in DKA. CT scan shows dilatation of the main pancreatic duct. I spoke with Dr.Rourk. The GI doctor. He stated the patient can be admitted by medicine given IV fluids nausea medicine and pain medicine as needed. GI will see the patient in the morning and probably get an MRI of her abdomen  Final Clinical Impressions(s) / ED Diagnoses   Final diagnoses:  Dehydration    New Prescriptions New Prescriptions   No medications on file     Milton Ferguson, MD 08/19/16 2157

## 2016-08-20 ENCOUNTER — Inpatient Hospital Stay (HOSPITAL_COMMUNITY): Payer: BC Managed Care – PPO

## 2016-08-20 DIAGNOSIS — E0865 Diabetes mellitus due to underlying condition with hyperglycemia: Secondary | ICD-10-CM

## 2016-08-20 DIAGNOSIS — K8689 Other specified diseases of pancreas: Secondary | ICD-10-CM

## 2016-08-20 DIAGNOSIS — R112 Nausea with vomiting, unspecified: Secondary | ICD-10-CM

## 2016-08-20 DIAGNOSIS — E860 Dehydration: Secondary | ICD-10-CM

## 2016-08-20 LAB — GLUCOSE, CAPILLARY
GLUCOSE-CAPILLARY: 160 mg/dL — AB (ref 65–99)
GLUCOSE-CAPILLARY: 100 mg/dL — AB (ref 65–99)
GLUCOSE-CAPILLARY: 153 mg/dL — AB (ref 65–99)
Glucose-Capillary: 212 mg/dL — ABNORMAL HIGH (ref 65–99)
Glucose-Capillary: 50 mg/dL — ABNORMAL LOW (ref 65–99)
Glucose-Capillary: 259 mg/dL — ABNORMAL HIGH (ref 65–99)
Glucose-Capillary: 50 mg/dL — ABNORMAL LOW (ref 65–99)

## 2016-08-20 LAB — URINALYSIS, ROUTINE W REFLEX MICROSCOPIC
Bilirubin Urine: NEGATIVE
HGB URINE DIPSTICK: NEGATIVE
KETONES UR: 80 mg/dL — AB
NITRITE: NEGATIVE
PROTEIN: NEGATIVE mg/dL
Specific Gravity, Urine: >1.046 — ABNORMAL HIGH (ref 1.005–1.030)
pH: 6 (ref 5.0–8.0)

## 2016-08-20 LAB — COMPREHENSIVE METABOLIC PANEL
ALK PHOS: 61 U/L (ref 38–126)
ALT: 13 U/L — AB (ref 14–54)
AST: 16 U/L (ref 15–41)
Albumin: 3.2 g/dL — ABNORMAL LOW (ref 3.5–5.0)
Anion gap: 7 (ref 5–15)
BUN: 16 mg/dL (ref 6–20)
CALCIUM: 8.3 mg/dL — AB (ref 8.9–10.3)
CHLORIDE: 107 mmol/L (ref 101–111)
CO2: 21 mmol/L — ABNORMAL LOW (ref 22–32)
CREATININE: 0.42 mg/dL — AB (ref 0.44–1.00)
Glucose, Bld: 213 mg/dL — ABNORMAL HIGH (ref 65–99)
Potassium: 3.5 mmol/L (ref 3.5–5.1)
Sodium: 135 mmol/L (ref 135–145)
Total Bilirubin: 0.7 mg/dL (ref 0.3–1.2)
Total Protein: 5.9 g/dL — ABNORMAL LOW (ref 6.5–8.1)

## 2016-08-20 LAB — CBC WITH DIFFERENTIAL/PLATELET
BASOS ABS: 0 10*3/uL (ref 0.0–0.1)
Basophils Relative: 0 %
Eosinophils Absolute: 0.1 10*3/uL (ref 0.0–0.7)
Eosinophils Relative: 1 %
HEMATOCRIT: 32.8 % — AB (ref 36.0–46.0)
HEMOGLOBIN: 10.7 g/dL — AB (ref 12.0–15.0)
LYMPHS ABS: 3.7 10*3/uL (ref 0.7–4.0)
LYMPHS PCT: 29 %
MCH: 29.1 pg (ref 26.0–34.0)
MCHC: 32.6 g/dL (ref 30.0–36.0)
MCV: 89.1 fL (ref 78.0–100.0)
Monocytes Absolute: 1.3 10*3/uL — ABNORMAL HIGH (ref 0.1–1.0)
Monocytes Relative: 10 %
NEUTROS ABS: 7.6 10*3/uL (ref 1.7–7.7)
NEUTROS PCT: 60 %
PLATELETS: 348 10*3/uL (ref 150–400)
RBC: 3.68 MIL/uL — AB (ref 3.87–5.11)
RDW: 12.4 % (ref 11.5–15.5)
WBC: 12.7 10*3/uL — AB (ref 4.0–10.5)

## 2016-08-20 LAB — PROTIME-INR
INR: 1.25
Prothrombin Time: 15.8 seconds — ABNORMAL HIGH (ref 11.4–15.2)

## 2016-08-20 MED ORDER — PANCRELIPASE (LIP-PROT-AMYL) 12000-38000 UNITS PO CPEP
24000.0000 [IU] | ORAL_CAPSULE | Freq: Once | ORAL | Status: AC
  Administered 2016-08-20: 24000 [IU] via ORAL

## 2016-08-20 MED ORDER — DEXTROSE 50 % IV SOLN
INTRAVENOUS | Status: AC
  Administered 2016-08-20: 25 mL
  Filled 2016-08-20: qty 50

## 2016-08-20 MED ORDER — GADOBENATE DIMEGLUMINE 529 MG/ML IV SOLN
10.0000 mL | Freq: Once | INTRAVENOUS | Status: AC | PRN
Start: 2016-08-20 — End: 2016-08-20
  Administered 2016-08-20: 9 mL via INTRAVENOUS

## 2016-08-20 NOTE — Consult Note (Signed)
Reason for Consult: Referring Physician:   [name] is an 26 y.o. female.  HPI: Admitted thru the ED yesterday. C/o vomiting, abdominal pain, and diarrhea x 2 1/2 days.  She says these are similar symptoms when she has pancreatitis.  She is followed at [facility] by Dr. Jerene Pitch. EUS in January of 2017.  Hx of hereditary pancreatitis. Father has hx of pancreatitis.  Today she feels better. She has pain left upper abdomen. She did keep a popsicle down last night. She is having 2 stools a day. Some nausea today but no vomiting.  Takes Creon 2 tabs before meals and one with snack. Hx of pancreatitis since she was about 5.   She is scheduled for an MRI today.  Hx of diabetes x 7rs.  She feels 20 Better.  She underwent a CT which revealed:  IMPRESSION: Severe dilatation of the main pancreatic duct without common bile duct dilatation. This may represent a ductal pancreatic neoplasm or downstream obstruction. MRI/ MRCP of the abdomen with and without intravenous contrast is recommended for further characterization. 07/26/2015 EUS Dr Jerene Pitch:  The pancreatic parenchyma is markedly atrophic in the body. Inflammatory changes without calcification in the head. No GB stones or sludge.    Past Medical History:  Diagnosis Date  . DKA (diabetic ketoacidoses) ([place])   . DM type 1 (diabetes mellitus, type 1) ([place])   . GERD (gastroesophageal reflux disease)   . Hereditary pancreatitis   . Hernia   . IBS (irritable bowel syndrome)   . Jaundice   . Migraines     Past Surgical History:  Procedure Laterality Date  . BILE DUCT STENT PLACEMENT  2006 at [place] 6 months after it was placed.  Marland Kitchen HERNIA REPAIR     As infant.  Marland Kitchen PANCREATIC CYST DRAINAGE  at age 66 year    Family History  Problem Relation Age of Onset  . Pancreatitis Father 30    hereditary  . Hypertension Father   . GER disease Father   . Hypothyroidism Mother   . Breast cancer Maternal Aunt   . Congenital heart  disease Maternal Grandfather   . Prostate cancer Maternal Grandfather   . Alzheimer's disease Maternal Grandfather     Social History:  reports that she has never smoked. She has never used smokeless tobacco. She reports that she does not drink alcohol or use drugs.  Allergies:  Allergies  Allergen Reactions  . Shellfish Allergy Nausea And Vomiting  . Cefzil [Cefprozil] Rash  . Penicillins Rash    Has patient had a PCN reaction causing immediate rash, facial/tongue/throat swelling, SOB or lightheadedness with hypotension: NO Has patient had a PCN reaction causing severe rash involving mucus membranes or skin necrosis: NO Has patient had a PCN reaction that required hospitalization: NO Has patient had a PCN reaction occurring within the last 10 years: NO If all of the above answers are "NO", then may proceed with Cephalosporin use.   . Sulfa Antibiotics Rash    Medications: I have reviewed the patient's current medications.  Results for orders placed or performed during the hospital encounter of 08/19/16 (from the past 48 hour(s))  hCG, quantitative, pregnancy     Status: None   Collection Time: 08/19/16  2:08 PM  Result Value Ref Range   hCG, Beta Chain, Quant, S 2 <5 mIU/mL    Comment:          GEST. AGE      CONC.  (mIU/mL)   <=  1 WEEK        5 - 50     2 WEEKS       50 - 500     3 WEEKS       100 - 10,000     4 WEEKS     1,000 - 30,000     5 WEEKS     3,500 - 115,000   6-8 WEEKS     12,000 - 270,000    12 WEEKS     15,000 - 220,000        FEMALE AND NON-PREGNANT FEMALE:     LESS THAN 5 mIU/mL   Lipase, blood     Status: Abnormal   Collection Time: 08/19/16  2:47 PM  Result Value Ref Range   Lipase <10 (L) 11 - 51 U/L  Comprehensive metabolic panel     Status: Abnormal   Collection Time: 08/19/16  2:47 PM  Result Value Ref Range   Sodium 133 (L) 135 - 145 mmol/L   Potassium 4.4 3.5 - 5.1 mmol/L   Chloride 96 (L) 101 - 111 mmol/L   CO2 22 22 - 32 mmol/L   Glucose,  Bld 326 (H) 65 - 99 mg/dL   BUN 28 (H) 6 - 20 mg/dL   Creatinine, Ser 0.74 0.44 - 1.00 mg/dL   Calcium 10.4 (H) 8.9 - 10.3 mg/dL   Total Protein 9.3 (H) 6.5 - 8.1 g/dL   Albumin 5.1 (H) 3.5 - 5.0 g/dL   AST 29 15 - 41 U/L   ALT 19 14 - 54 U/L   Alkaline Phosphatase 101 38 - 126 U/L   Total Bilirubin 1.2 0.3 - 1.2 mg/dL   GFR calc non Af Amer >60 >60 mL/min   GFR calc Af Amer >60 >60 mL/min    Comment: (NOTE) The eGFR has been calculated using the CKD EPI equation. This calculation has not been validated in all clinical situations. eGFR's persistently <60 mL/min signify possible Chronic Kidney Disease.    Anion gap 15 5 - 15  CBC     Status: Abnormal   Collection Time: 08/19/16  2:47 PM  Result Value Ref Range   WBC 14.8 (H) 4.0 - 10.5 K/uL   RBC 5.08 3.87 - 5.11 MIL/uL   Hemoglobin 15.5 (H) 12.0 - 15.0 g/dL   HCT 44.1 36.0 - 46.0 %   MCV 86.8 78.0 - 100.0 fL   MCH 30.5 26.0 - 34.0 pg   MCHC 35.1 30.0 - 36.0 g/dL   RDW 12.6 11.5 - 15.5 %   Platelets 493 (H) 150 - 400 K/uL  CBG monitoring, ED     Status: Abnormal   Collection Time: 08/19/16  9:53 PM  Result Value Ref Range   Glucose-Capillary 242 (H) 65 - 99 mg/dL  Glucose, capillary     Status: Abnormal   Collection Time: 08/19/16 11:49 PM  Result Value Ref Range   Glucose-Capillary 286 (H) 65 - 99 mg/dL   Comment 1 Notify RN    Comment 2 Document in Chart   Urinalysis, Routine w reflex microscopic     Status: Abnormal   Collection Time: 08/20/16  2:30 AM  Result Value Ref Range   Color, Urine STRAW (A) YELLOW   APPearance HAZY (A) CLEAR   Specific Gravity, Urine >1.046 (H) 1.005 - 1.030   pH 6.0 5.0 - 8.0   Glucose, UA >=500 (A) NEGATIVE mg/dL   Hgb urine dipstick NEGATIVE NEGATIVE   Bilirubin Urine NEGATIVE  NEGATIVE   Ketones, ur 80 (A) NEGATIVE mg/dL   Protein, ur NEGATIVE NEGATIVE mg/dL   Nitrite NEGATIVE NEGATIVE   Leukocytes, UA TRACE (A) NEGATIVE   RBC / HPF 0-5 0 - 5 RBC/hpf   WBC, UA 6-30 0 - 5 WBC/hpf    Bacteria, UA RARE (A) NONE SEEN  Glucose, capillary     Status: Abnormal   Collection Time: 08/20/16  4:01 AM  Result Value Ref Range   Glucose-Capillary 212 (H) 65 - 99 mg/dL   Comment 1 Notify RN    Comment 2 Document in Chart   CBC WITH DIFFERENTIAL     Status: Abnormal   Collection Time: 08/20/16  4:29 AM  Result Value Ref Range   WBC 12.7 (H) 4.0 - 10.5 K/uL   RBC 3.68 (L) 3.87 - 5.11 MIL/uL   Hemoglobin 10.7 (L) 12.0 - 15.0 g/dL    Comment: DELTA CHECK NOTED   HCT 32.8 (L) 36.0 - 46.0 %   MCV 89.1 78.0 - 100.0 fL   MCH 29.1 26.0 - 34.0 pg   MCHC 32.6 30.0 - 36.0 g/dL   RDW 12.4 11.5 - 15.5 %   Platelets 348 150 - 400 K/uL   Neutrophils Relative % 60 %   Neutro Abs 7.6 1.7 - 7.7 K/uL   Lymphocytes Relative 29 %   Lymphs Abs 3.7 0.7 - 4.0 K/uL   Monocytes Relative 10 %   Monocytes Absolute 1.3 (H) 0.1 - 1.0 K/uL   Eosinophils Relative 1 %   Eosinophils Absolute 0.1 0.0 - 0.7 K/uL   Basophils Relative 0 %   Basophils Absolute 0.0 0.0 - 0.1 K/uL  Comprehensive metabolic panel     Status: Abnormal   Collection Time: 08/20/16  4:29 AM  Result Value Ref Range   Sodium 135 135 - 145 mmol/L   Potassium 3.5 3.5 - 5.1 mmol/L    Comment: DELTA CHECK NOTED   Chloride 107 101 - 111 mmol/L   CO2 21 (L) 22 - 32 mmol/L   Glucose, Bld 213 (H) 65 - 99 mg/dL   BUN 16 6 - 20 mg/dL   Creatinine, Ser 0.42 (L) 0.44 - 1.00 mg/dL   Calcium 8.3 (L) 8.9 - 10.3 mg/dL   Total Protein 5.9 (L) 6.5 - 8.1 g/dL   Albumin 3.2 (L) 3.5 - 5.0 g/dL   AST 16 15 - 41 U/L   ALT 13 (L) 14 - 54 U/L   Alkaline Phosphatase 61 38 - 126 U/L   Total Bilirubin 0.7 0.3 - 1.2 mg/dL   GFR calc non Af Amer >60 >60 mL/min   GFR calc Af Amer >60 >60 mL/min    Comment: (NOTE) The eGFR has been calculated using the CKD EPI equation. This calculation has not been validated in all clinical situations. eGFR's persistently <60 mL/min signify possible Chronic Kidney Disease.    Anion gap 7 5 - 15  Protime-INR      Status: Abnormal   Collection Time: 08/20/16  4:29 AM  Result Value Ref Range   Prothrombin Time 15.8 (H) 11.4 - 15.2 seconds   INR 1.25   Glucose, capillary     Status: Abnormal   Collection Time: 08/20/16  7:38 AM  Result Value Ref Range   Glucose-Capillary 50 (L) 65 - 99 mg/dL   Comment 1 Notify RN    Comment 2 Document in Chart     Ct Abdomen Pelvis W Contrast  Result Date: 08/19/2016 CLINICAL DATA:  25  y/o F; left lower quadrant pain, nausea, vomiting, and diarrhea. EXAM: CT ABDOMEN AND PELVIS WITH CONTRAST TECHNIQUE: Multidetector CT imaging of the abdomen and pelvis was performed using the standard protocol following bolus administration of intravenous contrast. CONTRAST:  120m ISOVUE-300 IOPAMIDOL (ISOVUE-300) INJECTION 61% COMPARISON:  05/05/2015 CT of the abdomen and pelvis. FINDINGS: Lower chest: No acute abnormality. Hepatobiliary: No focal liver abnormality is seen. No gallstones, gallbladder wall thickening, or biliary dilatation. Pancreas: Severe dilatation of the main pancreatic duct to 11 mm. No obstructing lesion is identified. Spleen: Normal in size without focal abnormality. Adrenals/Urinary Tract: Adrenal glands are unremarkable. Kidneys are normal, without renal calculi, focal lesion, or hydronephrosis. Bladder is unremarkable. Stomach/Bowel: Stomach is within normal limits. Appendix not identified, no secondary signs of appendicitis. No evidence of bowel wall thickening, distention, or inflammatory changes. Vascular/Lymphatic: No significant vascular findings are present. No enlarged abdominal or pelvic lymph nodes. Reproductive: Uterus and bilateral adnexa are unremarkable. Other: No abdominal wall hernia or abnormality. No abdominopelvic ascites. Musculoskeletal: No acute or significant osseous findings. IMPRESSION: Severe dilatation of the main pancreatic duct without common bile duct dilatation. This may represent a ductal pancreatic neoplasm or downstream obstruction. MRI/  MRCP of the abdomen with and without intravenous contrast is recommended for further characterization. Electronically Signed   By: LKristine GarbeM.D.   On: 08/19/2016 21:22    ROS Blood pressure (!) 98/47, pulse 65, temperature 97.9 F (36.6 C), temperature source Oral, resp. rate 18, height '5\' 2"'  (1.575 m), weight 109 lb 6.4 oz (49.6 kg), last menstrual period 07/29/2016, SpO2 100 %. Physical Exam Alert and oriented. Skin warm and dry. Oral mucosa is moist.   . Sclera anicteric, conjunctivae is pink. Thyroid not enlarged. No cervical lymphadenopathy. Lungs clear. Heart regular rate and rhythm.  Abdomen is soft. Bowel sounds are positive. No hepatomegaly. No abdominal masses felt. Left upper quadrant pain.   No edema to lower extremities.    Assessment/Plan:  Hereditary Pancreatitis. CT revealed:  Severe dilatation of the main pancreatic duct without common bile duct dilatation. MRI is scheduled. Will discuss with Dr. RLaural Golden   Maan Zarcone W 08/20/2016, 7:52 AM

## 2016-08-20 NOTE — Progress Notes (Signed)
CBG 50, pt given 15 g carbs, will recheck in 15 minutes.

## 2016-08-20 NOTE — Progress Notes (Signed)
Inpatient Diabetes Program Recommendations  AACE/ADA: New Consensus Statement on Inpatient Glycemic Control (2015)  Target Ranges:  Prepandial:   less than 140 mg/dL      Peak postprandial:   less than 180 mg/dL (1-2 hours)      Critically ill patients:  140 - 180 mg/dL   Results for Linda Walls, Linda Walls (MRN [id number]) as of 08/20/2016 09:42  Ref. Range 08/19/2016 21:53 08/19/2016 23:49 08/20/2016 04:01 08/20/2016 07:38 08/20/2016 08:32  Glucose-Capillary Latest Ref Range: 65 - 99 mg/dL 242 (H) 286 (H) 212 (H) 50 (L) 160 (H)   Review of Glycemic Control  Diabetes history: DM1 Outpatient Diabetes medications: Tresiba 30 units QHS, Novolog 15-21 units TID with meals Current orders for Inpatient glycemic control: Lantus 30 units QHS, Novolog 0-15 units Q4H  Inpatient Diabetes Program Recommendations: Correction (SSI): Patient has Type 1 diabetes and is sensitive to insulin. Please consider decreasing Novolog correction to sensitive scale.  Thanks, Barnie Alderman, RN, MSN, CDE Diabetes Coordinator Inpatient Diabetes Program [phone] (Team Pager from 8am to 5pm)

## 2016-08-20 NOTE — Progress Notes (Signed)
PROGRESS NOTE    Linda Walls  [id number] DOB: 1990-10-30 DOA: 08/19/2016 PCP: [name], MD     Brief Narrative:  Patient is a 26 year old young woman well known to [place] for repeated hospitalizations for acute on chronic pancreatitis she has hereditary pancreatitis. Complained of nausea, vomiting, epigastric pain for 24 hours prior to admission. She states the symptoms are similar to her acute pancreatitis flare. Her lipase was normal.   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 Seen by GI who has recommended an MRI which has been done but results are pending at this time. -She was kept nothing by mouth and given IV fluids, however states she feels like she is ready to eat, we'll start her on a clear liquid diet to advance slowly as tolerated.  DVT prophylaxis: SCDs  Code Status: Full code  Family Communication: Mother at bedside updated on plan of care and questions answered  Disposition Plan: Hopeful for discharge home in 24-48 hours  Consultants:  GI  Procedures:  None  Antimicrobials:  Anti-infectives    None       Subjective: Abdominal pain, nausea improved, she would like to try clears  Objective: Vitals:   08/19/16 2300 08/19/16 2326 08/20/16 0403 08/20/16 1439  BP: 103/68 113/69 (!) 98/47 (!) 102/56  Pulse: 90 81 65 88  Resp: 17 18 18 20   Temp:  98.3 F (36.8 C) 97.9 F (36.6 C) 98 F (36.7 C)  TempSrc:  Oral Oral Axillary  SpO2: 98% 100% 100% 99%  Weight:  49.6 kg (109 lb 6.4 oz)    Height:  5\' 2"  (1.575 m)      Intake/Output Summary (Last 24 hours)  at 08/20/16 1511 Last data filed at 08/20/16 0300  Gross per 24 hour  Intake              400 ml  Output                1 ml  Net              399 ml   Filed Weights   08/19/16 1405 08/19/16 2326  Weight: 47.6 kg (105 lb) 49.6 kg (109 lb 6.4 oz)    Examination:  General exam: Alert, awake, oriented x 3 Respiratory system: Clear to auscultation. Respiratory effort normal. Cardiovascular system:RRR. No murmurs, rubs, gallops. Gastrointestinal system: Abdomen is nondistended, soft and nontender. No organomegaly or masses felt. Normal bowel sounds heard. Central nervous system: Alert and oriented. No focal neurological deficits. Extremities: No C/C/E, +pedal pulses Skin: No rashes, lesions or ulcers Psychiatry: Judgement and insight appear normal. Mood & affect appropriate.     Data Reviewed: I have personally reviewed following labs and imaging studies  CBC:  Recent Labs Lab 08/19/16 1447 08/20/16 0429  WBC 14.8* 12.7*  NEUTROABS  --  7.6  HGB 15.5* 10.7*  HCT 44.1 32.8*  MCV 86.8 89.1  PLT 493* 0000000   Basic Metabolic Panel:  Recent Labs Lab 08/19/16 1447 08/20/16 0429  NA 133* 135  K 4.4 3.5  CL 96* 107  CO2 22 21*  GLUCOSE 326* 213*  BUN 28* 16  CREATININE 0.74 0.42*  CALCIUM 10.4* 8.3*   GFR: Estimated Creatinine Clearance: 85.6 mL/min (by C-G formula based on SCr of 0.42 mg/dL (L)). Liver Function Tests:  Recent Labs Lab 08/19/16 1447 08/20/16 0429  AST 29 16  ALT 19 13*  ALKPHOS 101 61  BILITOT 1.2 0.7  PROT 9.3* 5.9*  ALBUMIN 5.1* 3.2*    Recent Labs Lab 08/19/16 1447  LIPASE <10*   No results for input(s): AMMONIA in the last 168 hours. Coagulation Profile:  Recent Labs Lab 08/20/16 0429  INR 1.25   Cardiac Enzymes: No results for input(s): CKTOTAL, CKMB, CKMBINDEX, TROPONINI in the last 168 hours. BNP (last 3 results) No results for input(s): PROBNP in the last 8760 hours. HbA1C: No results for input(s): HGBA1C in the last  72 hours. CBG:  Recent Labs Lab 08/20/16 0401 08/20/16 0738 08/20/16 0832 08/20/16 1043 08/20/16 1442  GLUCAP 212* 50* 160* 100* 259*   Lipid Profile: No results for input(s): CHOL, HDL, LDLCALC, TRIG, CHOLHDL, LDLDIRECT in the last 72 hours. Thyroid Function Tests: No results for input(s): TSH, T4TOTAL, FREET4, T3FREE, THYROIDAB in the last 72 hours. Anemia Panel: No results for input(s): VITAMINB12, FOLATE, FERRITIN, TIBC, IRON, RETICCTPCT in the last 72 hours. Urine analysis:    Component Value Date/Time   COLORURINE STRAW (A) 08/20/2016 0230   APPEARANCEUR HAZY (A) 08/20/2016 0230   LABSPEC >1.046 (H) 08/20/2016 0230   PHURINE 6.0 08/20/2016 0230   GLUCOSEU >=500 (A) 08/20/2016 0230   HGBUR NEGATIVE 08/20/2016 0230   BILIRUBINUR NEGATIVE 08/20/2016 0230   KETONESUR 80 (A) 08/20/2016 0230   PROTEINUR NEGATIVE 08/20/2016 0230   UROBILINOGEN 0.2 05/06/2015 0045   NITRITE NEGATIVE 08/20/2016 0230   LEUKOCYTESUR TRACE (A) 08/20/2016 0230   Sepsis Labs: @LABRCNTIP (procalcitonin:4,lacticidven:4)  )No results found for this or any previous visit (from the past 240 hour(s)).       Radiology Studies: Ct Abdomen Pelvis W Contrast  Result Date: 08/19/2016 CLINICAL DATA:  26 y/o F; left lower quadrant pain, nausea, vomiting, and diarrhea. EXAM: CT ABDOMEN AND PELVIS WITH CONTRAST TECHNIQUE: Multidetector CT imaging of the abdomen and pelvis was performed using the standard protocol following bolus administration of intravenous contrast. CONTRAST:  162mL ISOVUE-300 IOPAMIDOL (ISOVUE-300) INJECTION 61% COMPARISON:  05/05/2015 CT of the abdomen and pelvis. FINDINGS: Lower chest: No acute abnormality. Hepatobiliary: No focal liver abnormality is seen. No gallstones, gallbladder wall thickening, or biliary dilatation. Pancreas: Severe dilatation of the main pancreatic duct to 11 mm. No obstructing lesion is identified. Spleen: Normal in size without focal abnormality. Adrenals/Urinary  Tract: Adrenal glands are unremarkable. Kidneys are normal, without renal calculi, focal lesion, or hydronephrosis. Bladder is unremarkable. Stomach/Bowel: Stomach is within normal limits. Appendix not identified, no secondary signs of appendicitis. No evidence of bowel wall thickening, distention, or inflammatory changes. Vascular/Lymphatic: No significant vascular findings are present. No enlarged abdominal or pelvic lymph nodes. Reproductive: Uterus and bilateral adnexa are unremarkable. Other: No abdominal wall hernia or abnormality. No abdominopelvic ascites. Musculoskeletal: No acute or significant osseous findings. IMPRESSION: Severe dilatation of the main pancreatic duct without common bile duct dilatation. This may represent a ductal pancreatic neoplasm or downstream obstruction. MRI/ MRCP of the abdomen with and without intravenous contrast is recommended for further characterization. Electronically Signed   By: Kristine Garbe M.D.   On: 08/19/2016 21:22   Mr 3d Recon At Scanner  Result Date: 08/20/2016 CLINICAL DATA:  Nausea, vomiting and diarrhea since yesterday. Evaluate pancreatic duct dilatation  seen on recent CT scan. EXAM: MRI ABDOMEN WITHOUT AND WITH CONTRAST (INCLUDING MRCP) TECHNIQUE: Multiplanar multisequence MR imaging of the abdomen was performed both before and after the administration of intravenous contrast. Heavily T2-weighted images of the biliary and pancreatic ducts were obtained, and three-dimensional MRCP images were rendered by post processing. CONTRAST:  17mL MULTIHANCE GADOBENATE DIMEGLUMINE 529 MG/ML IV SOLN COMPARISON:  CT scan 08/19/2016 and 05/05/2015 FINDINGS: Lower chest: The lung bases are clear. No pulmonary lesions or pleural effusion. Normal heart size. No pericardial effusion. Hepatobiliary: No focal hepatic lesions or intrahepatic biliary dilatation. The portal and hepatic veins are normal. The gallbladder is normal. Normal caliber and course of the common  bile duct. Pancreas: Marked dilatation of the main pancreatic duct. Abrupt caliber change in the pancreatic head. In retrospect duct has been dilated since a CT scan 2014 although not to this extent. It measured approximately 6 mm on the CT scan from 05/05/2015. No pancreatic head mass. No acute inflammatory process. Someone beaded and irregular appearance of the duct in the pancreatic tail along with fairly significant pancreatic atrophy. Findings most likely due to early stricture. Spleen:  Normal size.  No focal lesions. Adrenals/Urinary Tract: The adrenal glands and kidneys are unremarkable. Stomach/Bowel: The stomach, duodenum, small bowel and colon are grossly normal. No inflammatory changes, mass lesions or obstructive findings. Vascular/Lymphatic: The aorta and branch vessels are normal. The major venous structures are patent. No mesenteric or retroperitoneal mass or adenopathy. Other:  No ascites or abdominal wall hernia. Musculoskeletal: No significant bony findings. IMPRESSION: 1. Dilatation of the main pancreatic duct and side branches, slowly progressive since 2014. No pancreatic head mass or pancreatic duct calculi. 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 The common bile duct is normal in caliber and has a normal course. 2. The pancreatic body and tail are atrophied but no acute inflammatory process. 3. No other significant abdominal findings. Electronically Signed   By: Marijo Sanes M.D.   On: 08/20/2016 12:21   Mr Abdomen Mrcp Moise Boring Contast  Result Date: 08/20/2016 CLINICAL DATA:  Nausea, vomiting and diarrhea since yesterday. Evaluate pancreatic duct dilatation seen on recent CT scan. EXAM: MRI ABDOMEN WITHOUT AND WITH CONTRAST (INCLUDING MRCP) TECHNIQUE: Multiplanar multisequence MR imaging of the abdomen was performed both before and after the administration of intravenous contrast. Heavily T2-weighted images of the biliary and pancreatic ducts were obtained, and  three-dimensional MRCP images were rendered by post processing. CONTRAST:  47mL MULTIHANCE GADOBENATE DIMEGLUMINE 529 MG/ML IV SOLN COMPARISON:  CT scan 08/19/2016 and 05/05/2015 FINDINGS: Lower chest: The lung bases are clear. No pulmonary lesions or pleural effusion. Normal heart size. No pericardial effusion. Hepatobiliary: No focal hepatic lesions or intrahepatic biliary dilatation. The portal and hepatic veins are normal. The gallbladder is normal. Normal caliber and course of the common bile duct. Pancreas: Marked dilatation of the main pancreatic duct. Abrupt caliber change in the pancreatic head. In retrospect duct has been dilated since a CT scan 2014 although not to this extent. It measured approximately 6 mm on the CT scan from 05/05/2015. No pancreatic head mass. No acute inflammatory process. Someone beaded and irregular appearance of the duct in the pancreatic tail along with fairly significant pancreatic atrophy. Findings most likely due to early stricture. Spleen:  Normal size.  No focal lesions. Adrenals/Urinary Tract: The adrenal glands and kidneys are unremarkable. Stomach/Bowel: The stomach, duodenum, small bowel and colon are grossly normal. No inflammatory changes, mass lesions or obstructive  findings. Vascular/Lymphatic: The aorta and branch vessels are normal. The major venous structures are patent. No mesenteric or retroperitoneal mass or adenopathy. Other:  No ascites or abdominal wall hernia. Musculoskeletal: No significant bony findings. IMPRESSION: 1. Dilatation of the main pancreatic duct and side branches, slowly progressive since 2014. No pancreatic head mass or pancreatic duct calculi. 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 The common bile duct is normal in caliber and has a normal course. 2. The pancreatic body and tail are atrophied but no acute inflammatory process. 3. No other significant abdominal findings. Electronically Signed   By: Marijo Sanes M.D.    On: 08/20/2016 12:21        Scheduled Meds: . cholecalciferol  5,000 Units Oral Daily  . famotidine (PEPCID) IV  20 mg Intravenous Q12H  . insulin aspart  0-15 Units Subcutaneous Q4H  . insulin glargine  30 Units Subcutaneous QHS  . lipase/protease/amylase  24,000 Units Oral TID WC  . pantoprazole  40 mg Oral Daily  . sodium chloride  1,000 mL Intravenous Once   Continuous Infusions:   LOS: 1 day    Time spent: 25 minutes. Greater than 50% of this time was spent in direct contact with the patient coordinating care.     Lelon Frohlich, MD [facility] Pager [phone]  If 7PM-7AM, please contact night-coverage [email or web address] Password [email or web address] 08/20/2016, 3:11 PM

## 2016-08-21 LAB — GLUCOSE, CAPILLARY
GLUCOSE-CAPILLARY: 270 mg/dL — AB (ref 65–99)
Glucose-Capillary: 113 mg/dL — ABNORMAL HIGH (ref 65–99)
Glucose-Capillary: 274 mg/dL — ABNORMAL HIGH (ref 65–99)
Glucose-Capillary: 205 mg/dL — ABNORMAL HIGH (ref 65–99)
Glucose-Capillary: 136 mg/dL — ABNORMAL HIGH (ref 65–99)

## 2016-08-21 LAB — CBC
HEMATOCRIT: 32.5 % — AB (ref 36.0–46.0)
Hemoglobin: 11.1 g/dL — ABNORMAL LOW (ref 12.0–15.0)
MCH: 30.2 pg (ref 26.0–34.0)
MCHC: 34.2 g/dL (ref 30.0–36.0)
MCV: 88.3 fL (ref 78.0–100.0)
Platelets: 292 10*3/uL (ref 150–400)
RBC: 3.68 MIL/uL — ABNORMAL LOW (ref 3.87–5.11)
RDW: 12.7 % (ref 11.5–15.5)
WBC: 9.6 10*3/uL (ref 4.0–10.5)

## 2016-08-21 LAB — BASIC METABOLIC PANEL
Anion gap: 6 (ref 5–15)
BUN: 9 mg/dL (ref 6–20)
CO2: 23 mmol/L (ref 22–32)
Calcium: 8.3 mg/dL — ABNORMAL LOW (ref 8.9–10.3)
Chloride: 110 mmol/L (ref 101–111)
Creatinine, Ser: 0.34 mg/dL — ABNORMAL LOW (ref 0.44–1.00)
GFR calc Af Amer: >60 mL/min (ref >60)
GLUCOSE: 94 mg/dL (ref 65–99)
POTASSIUM: 3.2 mmol/L — AB (ref 3.5–5.1)
Sodium: 139 mmol/L (ref 135–145)

## 2016-08-21 MED ORDER — POTASSIUM CHLORIDE 20 MEQ PO PACK
20.0000 meq | PACK | Freq: Two times a day (BID) | ORAL | Status: DC
  Administered 2016-08-21 – 2016-08-25 (×6): 20 meq via ORAL
  Filled 2016-08-21 (×9): qty 1

## 2016-08-21 MED ORDER — INSULIN ASPART 100 UNIT/ML ~~LOC~~ SOLN
3.0000 [IU] | Freq: Three times a day (TID) | SUBCUTANEOUS | Status: DC
  Administered 2016-08-21 – 2016-08-25 (×7): 3 [IU] via SUBCUTANEOUS

## 2016-08-21 MED ORDER — FAMOTIDINE 20 MG PO TABS
20.0000 mg | ORAL_TABLET | Freq: Two times a day (BID) | ORAL | Status: DC
  Administered 2016-08-21 – 2016-08-25 (×6): 20 mg via ORAL
  Filled 2016-08-21 (×8): qty 1

## 2016-08-21 MED ORDER — INSULIN ASPART 100 UNIT/ML ~~LOC~~ SOLN
0.0000 [IU] | Freq: Three times a day (TID) | SUBCUTANEOUS | Status: DC
  Administered 2016-08-21: 3 [IU] via SUBCUTANEOUS
  Administered 2016-08-22 (×2): 5 [IU] via SUBCUTANEOUS
  Administered 2016-08-23: 1 [IU] via SUBCUTANEOUS
  Administered 2016-08-23: 2 [IU] via SUBCUTANEOUS
  Administered 2016-08-24: 3 [IU] via SUBCUTANEOUS
  Administered 2016-08-24: 5 [IU] via SUBCUTANEOUS
  Administered 2016-08-24 – 2016-08-25 (×2): 2 [IU] via SUBCUTANEOUS
  Administered 2016-08-25: 3 [IU] via SUBCUTANEOUS

## 2016-08-21 MED ORDER — INSULIN GLARGINE 100 UNIT/ML ~~LOC~~ SOLN
20.0000 [IU] | Freq: Every day | SUBCUTANEOUS | Status: DC
  Filled 2016-08-21 (×3): qty 0.2

## 2016-08-21 MED ORDER — INSULIN GLARGINE 100 UNIT/ML ~~LOC~~ SOLN
20.0000 [IU] | SUBCUTANEOUS | Status: AC
  Administered 2016-08-21: 20 [IU] via SUBCUTANEOUS
  Filled 2016-08-21: qty 0.2

## 2016-08-21 MED ORDER — INSULIN ASPART 100 UNIT/ML ~~LOC~~ SOLN
0.0000 [IU] | Freq: Every day | SUBCUTANEOUS | Status: DC
  Administered 2016-08-24: 2 [IU] via SUBCUTANEOUS

## 2016-08-21 NOTE — Progress Notes (Signed)
Inpatient Diabetes Program Recommendations  AACE/ADA: New Consensus Statement on Inpatient Glycemic Control (2015)  Target Ranges:  Prepandial:   less than 140 mg/dL      Peak postprandial:   less than 180 mg/dL (1-2 hours)      Critically ill patients:  140 - 180 mg/dL   Results for Linda Walls, Linda Walls (MRN [id number]) as of 08/21/2016 09:32  Ref. Range 08/19/2016 23:49 08/20/2016 04:01 08/20/2016 07:38 08/20/2016 08:32 08/20/2016 10:43 08/20/2016 14:42 08/20/2016 22:05 08/20/2016 23:07  Glucose-Capillary Latest Ref Range: 65 - 99 mg/dL 286 (H) 212 (H) 50 (L) 160 (H) 100 (H) 259 (H) 50 (L) 153 (H)   Results for [name], [name] (MRN [id number]) as of 08/21/2016 09:32  Ref. Range 08/21/2016 08:21  Glucose-Capillary Latest Ref Range: 65 - 99 mg/dL 113 (H)    Home DM Meds: Tresiba 30 units QHS       Novolog 15-21 units TID  Current Insulin Orders: Lantus 30 units QHS      Novolog Moderate Correction Scale/ SSI (0-15 units) Q4 hours     -Note patient saw her Endocrinologist (Dr. Dorris Fetch in [place]) on 07/24/16.  At that visit, pt was instructed to use the following insulin regimen: Tresiba 30 units QHS Novolog 15 units TID with meals (when pre-meal CBG is 90-150 mg/dl) Novolog SSI for any CBG >150 mg/dl Novolog 5 units after meals (if pre-meal CBG is 70-90 mg/dl)   -Note patient received 30 units Lantus the night of 02/20.  Woke up the next day (02/21) with Hypoglycemia (CBG down to 50 mg/dl).    -CBG last night (02/21) was 50 mg/dl.  Night shift RN held Lantus dose last PM due to Hypoglycemia.  -Concern that pt at risk for DKA since basal insulin was held last PM (Lantus).  Per records, patient has Type 1 Diabetes and does not make endogenous insulin.    MD- Please consider the following:  1. Make sure pt receives some basal insulin today- Recommend Lantus 22 units daily (give 1st dose this AM).  This would be 75% of home dose to start.  2. Reduce Novolog Correction Scale/ SSI to  Sensitive scale (0-9 units) TID AC + HS    --Will follow patient during hospitalization--  Wyn Quaker RN, MSN, CDE Diabetes Coordinator Inpatient Glycemic Control Team Team Pager: [phone] (8a-5p)

## 2016-08-21 NOTE — Progress Notes (Addendum)
PROGRESS NOTE    KHARI GILLOOLY  [id number] DOB: Oct 30, 1990 DOA: 08/19/2016 PCP: [name], MD     Brief Narrative:  Patient is a 26 year old young woman well known to [place] for repeated hospitalizations for acute on chronic pancreatitis she has hereditary pancreatitis. Complained of nausea, vomiting, epigastric pain for 24 hours prior to admission. She states the symptoms are similar to her acute pancreatitis flare. Her lipase was normal.   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 Seen by GI: recommendations are pending regarding her MRCP results. -Continue full liquids today as she still is nauseous.  IDDM -Hypoglycemic 2 consecutive mornings. Overnight Lantus was held. -Given she is a type I diabetic will give Lantus 20 units now (home dose is 30 units), hold evening dose of Lantus. Will also decrease sliding scale from moderate to sensitive, make decisions regarding tomorrow's dose of Lantus based upon his CBGs.  DVT prophylaxis: SCDs  Code Status: Full code  Family Communication: Mother at bedside updated on plan of care and questions answered  Disposition Plan: Hopeful for discharge home in 24-48 hours  Consultants:  GI  Procedures:  None  Antimicrobials:  Anti-infectives    None       Subjective: Stable nauseous and with mild epigastric pain, keep on full liquids today.  Objective: Vitals:   08/20/16 1853 08/20/16 2228 08/21/16 0546 08/21/16 1459  BP: 101/77 (!) 106/54 (!) 96/54 105/62  Pulse: 65 (!) 105 72 71  Resp: 20 20 17 18   Temp: 98.4 F (36.9 C) 98.2  F (36.8 C) 98.5 F (36.9 C) 98.9 F (37.2 C)  TempSrc: Oral Oral Oral Oral  SpO2: 100% 100% 100% 100%  Weight:      Height:        Intake/Output Summary (Last 24 hours) at 08/21/16 1521 Last data filed at 08/21/16 0300  Gross per 24 hour  Intake          1801.67 ml  Output              400 ml  Net          1401.67 ml   Filed Weights   08/19/16 1405 08/19/16 2326  Weight: 47.6 kg (105 lb) 49.6 kg (109 lb 6.4 oz)    Examination:  General exam: Alert, awake, oriented x 3 Respiratory system: Clear to auscultation. Respiratory effort normal. Cardiovascular system:RRR. No murmurs, rubs, gallops. Gastrointestinal system: Abdomen is nondistended, soft and nontender. No organomegaly or masses felt. Normal bowel sounds heard. Central nervous system: Alert and oriented. No focal neurological deficits. Extremities: No C/C/E, +pedal pulses Skin: No rashes, lesions or ulcers Psychiatry: Judgement and insight appear normal. Mood & affect appropriate.     Data Reviewed: I have personally reviewed following labs and imaging studies  CBC:  Recent Labs Lab 08/19/16 1447 08/20/16 0429 08/21/16 0425  WBC 14.8* 12.7* 9.6  NEUTROABS  --  7.6  --   HGB 15.5* 10.7* 11.1*  HCT 44.1 32.8* 32.5*  MCV 86.8 89.1 88.3  PLT 493* 348 [id number]   Basic Metabolic Panel:  Recent Labs Lab 08/19/16 1447 08/20/16 0429 08/21/16 0425  NA 133* 135 139  K 4.4 3.5 3.2*  CL 96* 107 110  CO2 22 21* 23  GLUCOSE 326* 213* 94  BUN 28* 16 9  CREATININE 0.74 0.42* 0.34*  CALCIUM 10.4* 8.3* 8.3*   GFR: Estimated Creatinine Clearance: 85.6 mL/min (by C-G formula based on SCr of 0.34 mg/dL (L)). Liver Function Tests:  Recent Labs Lab 08/19/16 1447 08/20/16 0429  AST 29 16  ALT 19 13*  ALKPHOS 101 61  BILITOT 1.2 0.7  PROT 9.3* 5.9*  ALBUMIN 5.1* 3.2*    Recent Labs Lab 08/19/16 1447  LIPASE <10*   No results for input(s): AMMONIA in the last 168 hours. Coagulation Profile:  Recent  Labs Lab 08/20/16 0429  INR 1.25   Cardiac Enzymes: No results for input(s): CKTOTAL, CKMB, CKMBINDEX, TROPONINI in the last 168 hours. BNP (last 3 results) No results for input(s): PROBNP in the last 8760 hours. HbA1C: No results for input(s): HGBA1C in the last 72 hours. CBG:  Recent Labs Lab 08/20/16 2205 08/20/16 2307 08/21/16 0821 08/21/16 1200 08/21/16 1501  GLUCAP 50* 153* 113* 274* 270*   Lipid Profile: No results for input(s): CHOL, HDL, LDLCALC, TRIG, CHOLHDL, LDLDIRECT in the last 72 hours. Thyroid Function Tests: No results for input(s): TSH, T4TOTAL, FREET4, T3FREE, THYROIDAB in the last 72 hours. Anemia Panel: No results for input(s): VITAMINB12, FOLATE, FERRITIN, TIBC, IRON, RETICCTPCT in the last 72 hours. Urine analysis:    Component Value Date/Time   COLORURINE STRAW (A) 08/20/2016 0230   APPEARANCEUR HAZY (A) 08/20/2016 0230   LABSPEC >1.046 (H) 08/20/2016 0230   PHURINE 6.0 08/20/2016 0230   GLUCOSEU >=500 (A) 08/20/2016 0230   HGBUR NEGATIVE 08/20/2016 0230   BILIRUBINUR NEGATIVE 08/20/2016 0230   KETONESUR 80 (A) 08/20/2016 0230   PROTEINUR NEGATIVE 08/20/2016 0230   UROBILINOGEN 0.2 05/06/2015 0045   NITRITE NEGATIVE 08/20/2016 0230   LEUKOCYTESUR TRACE (A) 08/20/2016 0230   Sepsis Labs: @LABRCNTIP (procalcitonin:4,lacticidven:4)  )No results found for this or any previous visit (from the past 240 hour(s)).       Radiology Studies: Ct Abdomen Pelvis W Contrast  Result Date: 08/19/2016 CLINICAL DATA:  26 y/o F; left lower quadrant pain, nausea, vomiting, and diarrhea. EXAM: CT ABDOMEN AND PELVIS WITH CONTRAST TECHNIQUE: Multidetector CT imaging of the abdomen and pelvis was performed using the standard protocol following bolus administration of intravenous contrast. CONTRAST:  116mL ISOVUE-300 IOPAMIDOL (ISOVUE-300) INJECTION 61% COMPARISON:  05/05/2015 CT of the abdomen and pelvis. FINDINGS: Lower chest: No acute abnormality.  Hepatobiliary: No focal liver abnormality is seen. No gallstones, gallbladder wall thickening, or biliary dilatation. Pancreas: Severe dilatation of the main pancreatic duct to 11 mm. No obstructing lesion is identified. Spleen: Normal in size without focal abnormality. Adrenals/Urinary Tract: Adrenal glands are unremarkable. Kidneys are normal, without renal calculi, focal lesion, or hydronephrosis. Bladder is unremarkable. Stomach/Bowel: Stomach is within normal limits. Appendix not identified, no secondary signs of appendicitis. No evidence of bowel wall thickening, distention, or inflammatory changes. Vascular/Lymphatic: No significant vascular findings are present. No enlarged abdominal or pelvic lymph nodes. Reproductive: Uterus and bilateral adnexa are unremarkable. Other: No abdominal wall hernia or abnormality. No abdominopelvic ascites. Musculoskeletal: No acute or significant osseous findings. IMPRESSION: Severe dilatation of the main pancreatic duct without common bile duct dilatation. This may represent a ductal pancreatic neoplasm or downstream obstruction. MRI/ MRCP of the abdomen with and without intravenous contrast is recommended for further characterization. Electronically Signed   By: Edgardo Roys.D.  On: 08/19/2016 21:22   Mr 3d Recon At Scanner  Result Date: 08/20/2016 CLINICAL DATA:  Nausea, vomiting and diarrhea since yesterday. Evaluate pancreatic duct dilatation seen on recent CT scan. EXAM: MRI ABDOMEN WITHOUT AND WITH CONTRAST (INCLUDING MRCP) TECHNIQUE: Multiplanar multisequence MR imaging of the abdomen was performed both before and after the administration of intravenous contrast. Heavily T2-weighted images of the biliary and pancreatic ducts were obtained, and three-dimensional MRCP images were rendered by post processing. CONTRAST:  19mL MULTIHANCE GADOBENATE DIMEGLUMINE 529 MG/ML IV SOLN COMPARISON:  CT scan 08/19/2016 and 05/05/2015 FINDINGS: Lower chest: The lung  bases are clear. No pulmonary lesions or pleural effusion. Normal heart size. No pericardial effusion. Hepatobiliary: No focal hepatic lesions or intrahepatic biliary dilatation. The portal and hepatic veins are normal. The gallbladder is normal. Normal caliber and course of the common bile duct. Pancreas: Marked dilatation of the main pancreatic duct. Abrupt caliber change in the pancreatic head. In retrospect duct has been dilated since a CT scan 2014 although not to this extent. It measured approximately 6 mm on the CT scan from 05/05/2015. No pancreatic head mass. No acute inflammatory process. Someone beaded and irregular appearance of the duct in the pancreatic tail along with fairly significant pancreatic atrophy. Findings most likely due to early stricture. Spleen:  Normal size.  No focal lesions. Adrenals/Urinary Tract: The adrenal glands and kidneys are unremarkable. Stomach/Bowel: The stomach, duodenum, small bowel and colon are grossly normal. No inflammatory changes, mass lesions or obstructive findings. Vascular/Lymphatic: The aorta and branch vessels are normal. The major venous structures are patent. No mesenteric or retroperitoneal mass or adenopathy. Other:  No ascites or abdominal wall hernia. Musculoskeletal: No significant bony findings. IMPRESSION: 1. Dilatation of the main pancreatic duct and side branches, slowly progressive since 2014. No pancreatic head mass or pancreatic duct calculi. 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 The common bile duct is normal in caliber and has a normal course. 2. The pancreatic body and tail are atrophied but no acute inflammatory process. 3. No other significant abdominal findings. Electronically Signed   By: Marijo Sanes M.D.   On: 08/20/2016 12:21   Mr Abdomen Mrcp Moise Boring Contast  Result Date: 08/20/2016 CLINICAL DATA:  Nausea, vomiting and diarrhea since yesterday. Evaluate pancreatic duct dilatation seen on recent CT scan. EXAM:  MRI ABDOMEN WITHOUT AND WITH CONTRAST (INCLUDING MRCP) TECHNIQUE: Multiplanar multisequence MR imaging of the abdomen was performed both before and after the administration of intravenous contrast. Heavily T2-weighted images of the biliary and pancreatic ducts were obtained, and three-dimensional MRCP images were rendered by post processing. CONTRAST:  40mL MULTIHANCE GADOBENATE DIMEGLUMINE 529 MG/ML IV SOLN COMPARISON:  CT scan 08/19/2016 and 05/05/2015 FINDINGS: Lower chest: The lung bases are clear. No pulmonary lesions or pleural effusion. Normal heart size. No pericardial effusion. Hepatobiliary: No focal hepatic lesions or intrahepatic biliary dilatation. The portal and hepatic veins are normal. The gallbladder is normal. Normal caliber and course of the common bile duct. Pancreas: Marked dilatation of the main pancreatic duct. Abrupt caliber change in the pancreatic head. In retrospect duct has been dilated since a CT scan 2014 although not to this extent. It measured approximately 6 mm on the CT scan from 05/05/2015. No pancreatic head mass. No acute inflammatory process. Someone beaded and irregular appearance of the duct in the pancreatic tail along with fairly significant pancreatic atrophy. Findings most likely due to early stricture. Spleen:  Normal size.  No focal lesions.  Adrenals/Urinary Tract: The adrenal glands and kidneys are unremarkable. Stomach/Bowel: The stomach, duodenum, small bowel and colon are grossly normal. No inflammatory changes, mass lesions or obstructive findings. Vascular/Lymphatic: The aorta and branch vessels are normal. The major venous structures are patent. No mesenteric or retroperitoneal mass or adenopathy. Other:  No ascites or abdominal wall hernia. Musculoskeletal: No significant bony findings. IMPRESSION: 1. Dilatation of the main pancreatic duct and side branches, slowly progressive since 2014. No pancreatic head mass or pancreatic duct calculi. Findings most likely due  to a stricture in the pancreatic head. The common bile duct is normal in caliber and has a normal course. 2. The pancreatic body and tail are atrophied but no acute inflammatory process. 3. No other significant abdominal findings. Electronically Signed   By: Marijo Sanes M.D.   On: 08/20/2016 12:21        Scheduled Meds: . cholecalciferol  5,000 Units Oral Daily  . famotidine  20 mg Oral BID  . insulin aspart  0-5 Units Subcutaneous QHS  . insulin aspart  0-9 Units Subcutaneous TID WC  . insulin aspart  3 Units Subcutaneous TID WC  . insulin glargine  20 Units Subcutaneous NOW  . [START ON 08/22/2016] insulin glargine  20 Units Subcutaneous QHS  . lipase/protease/amylase  24,000 Units Oral TID WC  . pantoprazole  40 mg Oral Daily  . sodium chloride  1,000 mL Intravenous Once   Continuous Infusions:   LOS: 2 days    Time spent: 25 minutes. Greater than 50% of this time was spent in direct contact with the patient coordinating care.     Lelon Frohlich, MD [facility] Pager [phone]  If 7PM-7AM, please contact night-coverage [email or web address] Password [email or web address] 08/21/2016, 3:21 PM

## 2016-08-21 NOTE — Progress Notes (Addendum)
Patient ID: Linda Walls, female   DOB: [birth year], 26 y.o.   MRN: [id number] Resting quietly. Mother in room. States pain the same. 7/10. Nausea but no vomiting. Tolerating diet.   Blood pressure (!) 96/54, pulse 72, temperature 98.5 F (36.9 C), temperature source Oral, resp. rate 17, height 5' 2" (1.575 m), weight 109 lb 6.4 oz (49.6 kg), last menstrual period 07/29/2016, SpO2 100 %. Hemoglobin  11.1.  Chronic pancreatitis: Will continue to follow. Dr. [name] has sent MRI films to Dr. Jerene Pitch.  For his opinion.    GI attending note:  Patient reports some increase in abdominal pain with full liquid diet. She is not having any nausea or diarrhea. Abdomen remains soft with mild to moderate midepigastric tenderness. Leukocytosis is resolved. Serum calcium is stable at 8.3 but serum potassium is 3.2.  I have not yet heard from [place]. Will start patient on KCl 20 mg by mouth twice a day. CBC and be met in a.m.

## 2016-08-21 NOTE — Care Management Note (Signed)
Case Management Note  Patient Details  Name: Linda Walls MRN: [id number] Date of Birth: Jul 18, 1990  Subjective/Objective:                  Pt admitted with acute/chronic pancreatitis. She is from home, lives with family. She is ind with ADL's. She has PCP, transportation to appointments and insurance with drug coverage. She has no difficulty affording or managing medications.   Action/Plan: Pt plans to return home with self care. No CM needs anticipated.   Expected Discharge Date:     08/22/2016             Expected Discharge Plan:  Home/Self Care  In-House Referral:  NA  Discharge planning Services  CM Consult  Post Acute Care Choice:  NA Choice offered to:  NA  Status of Service:  Completed, signed off  Sherald Barge, RN 08/21/2016, 10:59 AM

## 2016-08-22 LAB — GLUCOSE, CAPILLARY
GLUCOSE-CAPILLARY: 44 mg/dL — AB (ref 65–99)
GLUCOSE-CAPILLARY: 119 mg/dL — AB (ref 65–99)
Glucose-Capillary: 291 mg/dL — ABNORMAL HIGH (ref 65–99)
Glucose-Capillary: 449 mg/dL — ABNORMAL HIGH (ref 65–99)
Glucose-Capillary: 107 mg/dL — ABNORMAL HIGH (ref 65–99)

## 2016-08-22 LAB — BASIC METABOLIC PANEL
ANION GAP: 6 (ref 5–15)
BUN: 6 mg/dL (ref 6–20)
CALCIUM: 8.4 mg/dL — AB (ref 8.9–10.3)
CO2: 26 mmol/L (ref 22–32)
CREATININE: 0.32 mg/dL — AB (ref 0.44–1.00)
Chloride: 110 mmol/L (ref 101–111)
Glucose, Bld: 56 mg/dL — ABNORMAL LOW (ref 65–99)
Potassium: 3.1 mmol/L — ABNORMAL LOW (ref 3.5–5.1)
SODIUM: 142 mmol/L (ref 135–145)

## 2016-08-22 MED ORDER — INSULIN GLARGINE 100 UNIT/ML ~~LOC~~ SOLN
18.0000 [IU] | Freq: Every day | SUBCUTANEOUS | Status: DC
  Administered 2016-08-24: 18 [IU] via SUBCUTANEOUS
  Filled 2016-08-22 (×4): qty 0.18

## 2016-08-22 MED ORDER — PANCRELIPASE (LIP-PROT-AMYL) 12000-38000 UNITS PO CPEP
48000.0000 [IU] | ORAL_CAPSULE | Freq: Three times a day (TID) | ORAL | Status: DC
  Administered 2016-08-22 – 2016-08-25 (×6): 48000 [IU] via ORAL
  Filled 2016-08-22 (×6): qty 4

## 2016-08-22 NOTE — Progress Notes (Signed)
PROGRESS NOTE    Linda Walls  [id number] DOB: 05/10/1991 DOA: 08/19/2016 PCP: [name], MD     Brief Narrative:  Patient is a 26 year old young woman well known to [place] for repeated hospitalizations for acute on chronic pancreatitis she has hereditary pancreatitis. Complained of nausea, vomiting, epigastric pain for 24 hours prior to admission. She states the symptoms are similar to her acute pancreatitis flare. Her lipase was normal.   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 Seen by GI: recommendations are pending regarding her MRCP results. -Continue full liquids today as she still is nauseous. She states this is pretty typical for her pancreatitis flares and she is usually in the hospital for about a week before she improves.  IDDM -She continues to be hypoglycemic in the early morning with CBGs that trend into the mid to high 200s during the rest of the day. On 2/22 her Lantus was decreased from 30 units to 20 units.  -As she continues to be hypoglycemic will further decrease Lantus to 18 units tonight.  DVT prophylaxis: SCDs  Code Status: Full code  Family Communication:  patient only  Disposition Plan: Hopeful for discharge home in  48-72 hrs  Consultants:  GI  Procedures:  None  Antimicrobials:  Anti-infectives    None       Subjective: Stable nauseous and with mild epigastric pain, keep on full liquids today.  Objective: Vitals:   08/21/16 0546 08/21/16 1459 08/22/16 0530 08/22/16 1416  BP: (!) 96/54 105/62 104/65 107/69  Pulse: 72 71 84 66  Resp: 17 18  16 20   Temp: 98.5 F (36.9 C) 98.9 F (37.2 C) 98.5 F (36.9 C) 98.4 F (36.9 C)  TempSrc: Oral Oral Oral Oral  SpO2: 100% 100% 100% 100%  Weight:      Height:        Intake/Output Summary (Last 24 hours) at 08/22/16 1652 Last data filed at 08/22/16 1416  Gross per 24 hour  Intake              720 ml  Output              500 ml  Net              220 ml   Filed Weights   08/19/16 1405 08/19/16 2326  Weight: 47.6 kg (105 lb) 49.6 kg (109 lb 6.4 oz)    Examination:  General exam: Alert, awake, oriented x 3 Respiratory system: Clear to auscultation. Respiratory effort normal. Cardiovascular system:RRR. No murmurs, rubs, gallops. Gastrointestinal system: Abdomen is nondistended, soft and nontender. No organomegaly or masses felt. Normal bowel sounds heard. Central nervous system: Alert and oriented. No focal neurological deficits. Extremities: No C/C/E, +pedal pulses Skin: No rashes, lesions or ulcers Psychiatry: Judgement and insight appear normal. Mood & affect appropriate.     Data Reviewed: I have personally reviewed following labs and imaging studies  CBC:  Recent Labs Lab 08/19/16 1447 08/20/16 0429 08/21/16 0425  WBC 14.8* 12.7* 9.6  NEUTROABS  --  7.6  --   HGB 15.5* 10.7* 11.1*  HCT 44.1 32.8* 32.5*  MCV  86.8 89.1 88.3  PLT 493* 348 123456   Basic Metabolic Panel:  Recent Labs Lab 08/19/16 1447 08/20/16 0429 08/21/16 0425 08/22/16 0504  NA 133* 135 139 142  K 4.4 3.5 3.2* 3.1*  CL 96* 107 110 110  CO2 22 21* 23 26  GLUCOSE 326* 213* 94 56*  BUN 28* 16 9 6   CREATININE 0.74 0.42* 0.34* 0.32*  CALCIUM 10.4* 8.3* 8.3* 8.4*   GFR: Estimated Creatinine Clearance: 85.6 mL/min (by C-G formula based on SCr of 0.32 mg/dL (L)). Liver Function Tests:  Recent Labs Lab 08/19/16 1447 08/20/16 0429  AST 29 16  ALT 19 13*  ALKPHOS 101 61  BILITOT 1.2 0.7  PROT 9.3* 5.9*  ALBUMIN 5.1* 3.2*    Recent Labs Lab 08/19/16 1447  LIPASE <10*   No  results for input(s): AMMONIA in the last 168 hours. Coagulation Profile:  Recent Labs Lab 08/20/16 0429  INR 1.25   Cardiac Enzymes: No results for input(s): CKTOTAL, CKMB, CKMBINDEX, TROPONINI in the last 168 hours. BNP (last 3 results) No results for input(s): PROBNP in the last 8760 hours. HbA1C: No results for input(s): HGBA1C in the last 72 hours. CBG:  Recent Labs Lab 08/21/16 1645 08/21/16 2226 08/22/16 0803 08/22/16 0841 08/22/16 1125  GLUCAP 205* 136* 44* 119* 291*   Lipid Profile: No results for input(s): CHOL, HDL, LDLCALC, TRIG, CHOLHDL, LDLDIRECT in the last 72 hours. Thyroid Function Tests: No results for input(s): TSH, T4TOTAL, FREET4, T3FREE, THYROIDAB in the last 72 hours. Anemia Panel: No results for input(s): VITAMINB12, FOLATE, FERRITIN, TIBC, IRON, RETICCTPCT in the last 72 hours. Urine analysis:    Component Value Date/Time   COLORURINE STRAW (A) 08/20/2016 0230   APPEARANCEUR HAZY (A) 08/20/2016 0230   LABSPEC >1.046 (H) 08/20/2016 0230   PHURINE 6.0 08/20/2016 0230   GLUCOSEU >=500 (A) 08/20/2016 0230   HGBUR NEGATIVE 08/20/2016 0230   BILIRUBINUR NEGATIVE 08/20/2016 0230   KETONESUR 80 (A) 08/20/2016 0230   PROTEINUR NEGATIVE 08/20/2016 0230   UROBILINOGEN 0.2 05/06/2015 0045   NITRITE NEGATIVE 08/20/2016 0230   LEUKOCYTESUR TRACE (A) 08/20/2016 0230   Sepsis Labs: @LABRCNTIP (procalcitonin:4,lacticidven:4)  )No results found for this or any previous visit (from the past 240 hour(s)).       Radiology Studies: No results found.      Scheduled Meds: . cholecalciferol  5,000 Units Oral Daily  . famotidine  20 mg Oral BID  . insulin aspart  0-5 Units Subcutaneous QHS  . insulin aspart  0-9 Units Subcutaneous TID WC  . insulin aspart  3 Units Subcutaneous TID WC  . insulin glargine  20 Units Subcutaneous QHS  . lipase/protease/amylase  48,000 Units Oral TID WC  . pantoprazole  40 mg Oral Daily  . potassium chloride  20 mEq  Oral BID  . sodium chloride  1,000 mL Intravenous Once   Continuous Infusions:   LOS: 3 days    Time spent: 25 minutes. Greater than 50% of this time was spent in direct contact with the patient coordinating care.     Lelon Frohlich, MD [facility] Pager [phone]  If 7PM-7AM, please contact night-coverage [email or web address] Password [email or web address] 08/22/2016, 4:52 PM

## 2016-08-22 NOTE — Progress Notes (Signed)
Inpatient Diabetes Program Recommendations  AACE/ADA: New Consensus Statement on Inpatient Glycemic Control (2015)  Target Ranges:  Prepandial:   less than 140 mg/dL      Peak postprandial:   less than 180 mg/dL (1-2 hours)      Critically ill patients:  140 - 180 mg/dL  Results for DIELLA, HERMANNS (MRN [id number]) as of 08/22/2016 07:24  Ref. Range 08/22/2016 05:04  Glucose Latest Ref Range: 65 - 99 mg/dL 56 (L)   Results for BRETTANY, TYRRELL (MRN [id number]) as of 08/22/2016 07:24  Ref. Range 08/21/2016 08:21 08/21/2016 12:00 08/21/2016 15:01 08/21/2016 16:45 08/21/2016 22:26  Glucose-Capillary Latest Ref Range: 65 - 99 mg/dL 113 (H) 274 (H)  Novolog 8 units 270 (H) 205 (H)  Novolog 6 units  Lantus 20 units @ 16:14 136 (H)   Review of Glycemic Control   Diabetes history: DM1 Outpatient Diabetes medications: Tresiba 30 units QHS, Novolog 15-21 units TID with meals Current orders for Inpatient glycemic control: Lantus 20 units QHS, Novolog 0-9 units TID with meals, Novolog 0-5 units QHS, Novolog 3 units TID with meals for meal coverage    Inpatient Diabetes Program Recommendations:  Insulin - Basal: Patient received Lantus 20 units on 08/21/16 and fasting glucose 56 mg/dl this morning. Please consider decreasing Lantus to 16 units QHS.  Thanks, Barnie Alderman, RN, MSN, CDE Diabetes Coordinator Inpatient Diabetes Program [phone] (Team Pager from 8am to 5pm)

## 2016-08-22 NOTE — Progress Notes (Addendum)
  Subjective:  Patient reports some increase in severity of pain with full liquids. She is having some nausea but no vomiting. She had normal bowel movement today.  Objective: Blood pressure 107/69, pulse 66, temperature 98.4 F (36.9 C), temperature source Oral, resp. rate 20, height 5\' 2"  (1.575 m), weight 109 lb 6.4 oz (49.6 kg), last menstrual period 07/29/2016, SpO2 100 %. Patient is alert and in no acute distress. Abdomen is soft with mild midepigastric tenderness. No organomegaly or masses. No LE edema or clubbing noted.  Labs/studies Results:   Recent Labs  08/20/16 0429 08/21/16 0425  WBC 12.7* 9.6  HGB 10.7* 11.1*  HCT 32.8* 32.5*  PLT 348 292    BMET   Recent Labs  08/20/16 0429 08/21/16 0425 08/22/16 0504  NA 135 139 142  K 3.5 3.2* 3.1*  CL 107 110 110  CO2 21* 23 26  GLUCOSE 213* 94 56*  BUN 16 9 6   CREATININE 0.42* 0.34* 0.32*  CALCIUM 8.3* 8.3* 8.4*    LFT   Recent Labs  08/20/16 0429  PROT 5.9*  ALBUMIN 3.2*  AST 16  ALT 13*  ALKPHOS 61  BILITOT 0.7    PT/INR   Recent Labs  08/20/16 0429  LABPROT 15.8*  INR 1.25     Assessment:  #1.Acute on chronic pancreatitis secondary to hereditary pancreatitis. Patient has markedly dilated pancreatic duct except segment in head of pancreas is normal or she could have a stricture. CT and MRCP images have been sent Dr. Jerene Pitch via [employer] to get his opinion. He has not yet reviewed of these images but he said he would soon. Patient's not ready for diet to be advanced.  #2. Pancreatic insufficiency. She is presently on pancreatic enzyme supplement 1000 units/kg per meal. This dose appears to be sufficient but there is warm to go up if she was to lose weight.  #3. Hypokalemia. Patient is on oral KCl.   Recommendations:  Continue full liquids for now. Pancreatic enzyme dose was adjusted earlier today. CBC, serum magnesium and metabolic 7 in a.m.  Dr.Rourk will be seeing patient over the  weekend.

## 2016-08-23 DIAGNOSIS — K8689 Other specified diseases of pancreas: Secondary | ICD-10-CM

## 2016-08-23 LAB — CBC
HEMATOCRIT: 36.3 % (ref 36.0–46.0)
HEMOGLOBIN: 12.2 g/dL (ref 12.0–15.0)
MCH: 29.7 pg (ref 26.0–34.0)
MCHC: 33.6 g/dL (ref 30.0–36.0)
MCV: 88.3 fL (ref 78.0–100.0)
Platelets: 296 10*3/uL (ref 150–400)
RBC: 4.11 MIL/uL (ref 3.87–5.11)
RDW: 12.6 % (ref 11.5–15.5)
WBC: 7.2 10*3/uL (ref 4.0–10.5)

## 2016-08-23 LAB — BASIC METABOLIC PANEL
Anion gap: 6 (ref 5–15)
CALCIUM: 8.6 mg/dL — AB (ref 8.9–10.3)
CHLORIDE: 105 mmol/L (ref 101–111)
CO2: 28 mmol/L (ref 22–32)
CREATININE: 0.53 mg/dL (ref 0.44–1.00)
GFR calc non Af Amer: >60 mL/min (ref >60)
GLUCOSE: 143 mg/dL — AB (ref 65–99)
Potassium: 3.6 mmol/L (ref 3.5–5.1)
Sodium: 139 mmol/L (ref 135–145)

## 2016-08-23 LAB — GLUCOSE, CAPILLARY
GLUCOSE-CAPILLARY: 190 mg/dL — AB (ref 65–99)
Glucose-Capillary: 146 mg/dL — ABNORMAL HIGH (ref 65–99)
Glucose-Capillary: 104 mg/dL — ABNORMAL HIGH (ref 65–99)
Glucose-Capillary: 103 mg/dL — ABNORMAL HIGH (ref 65–99)

## 2016-08-23 LAB — MAGNESIUM: Magnesium: 1.7 mg/dL (ref 1.7–2.4)

## 2016-08-23 MED ORDER — FAMOTIDINE IN NACL 20-0.9 MG/50ML-% IV SOLN
20.0000 mg | Freq: Once | INTRAVENOUS | Status: AC
  Administered 2016-08-24: 20 mg via INTRAVENOUS
  Filled 2016-08-23: qty 50

## 2016-08-23 MED ORDER — PROMETHAZINE HCL 12.5 MG PO TABS
12.5000 mg | ORAL_TABLET | Freq: Once | ORAL | Status: AC
  Administered 2016-08-23: 12.5 mg via ORAL
  Filled 2016-08-23: qty 1

## 2016-08-23 NOTE — Plan of Care (Signed)
Problem: Physical Regulation: Goal: Ability to maintain clinical measurements within normal limits will improve Outcome: Not Progressing Closely following patient's blood sugars.

## 2016-08-23 NOTE — Progress Notes (Signed)
Vascular Access RN unable to obtain peripheral IV access.  Dr. Jerilee Hoh notified and gave order insert midline access.  Vascular access notified.

## 2016-08-23 NOTE — Progress Notes (Signed)
The patient says she is about the same. Still with postprandial abdominal discomfort after eating. Nausea vomiting or diarrhea.  Potassium 3.6, magnesium 1.7 , white count 7.2, hemoglobin 12.2 this morning this morning  Vital signs in last 24 hours: Temp:  [97.7 F (36.5 C)-98.4 F (36.9 C)] 97.9 F (36.6 C) (02/24 0500) Pulse Rate:  [66-75] 68 (02/24 0500) Resp:  [20] 20 (02/24 0500) BP: (95-118)/(63-72) 95/67 (02/24 0500) SpO2:  [100 %] 100 % (02/24 0500) Last BM Date: 08/22/16 (per patient) General:   Alert,   pleasant and cooperative in NAD. Accompanied by her mother. Abdomen:  Nondistended. Positive bowel sounds. Does have epigastric tenderness to palpation. Extremities:  Without clubbing or edema.    Intake/Output from previous day: 02/23 0701 - 02/24 0700 In: 1680 [P.O.:1680] Out: 500 [Urine:500] Intake/Output this shift: No intake/output data recorded.  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 Still with postprandial abdominal pain. Pancreatic enzymes increased recently. We still have room to escalate dosing further if needed. Await tertiary review of recent imaging done here. With morphologic changes noted in her pancreatic duct, I suspect she may benefit from endoscopic intervention at some point in the future.  Consider magnesium supplementation in addition to potassium supplementation per attending

## 2016-08-23 NOTE — Progress Notes (Addendum)
PROGRESS NOTE    Linda Walls  [id number] DOB: [birth year] DOA: 08/19/2016 PCP: [name], MD     Brief Narrative:  Patient is a 26 year old young woman well known to [place] for repeated hospitalizations for acute on chronic pancreatitis she has hereditary pancreatitis. Complained of nausea, vomiting, epigastric pain for 24 hours prior to admission. She states the symptoms are similar to her acute pancreatitis flare. Her lipase was normal.   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 Seen by GI: recommendations are pending regarding her MRCP results. -Still with significant pain after eating, we'll downgrade diet back to nothing by mouth.  IDDM -Lantus dose has been decreased from 30 units to 18 units due to hypoglycemia likely due to nothing by mouth status. -CBGs improved. Continue current dose of insulin.  DVT prophylaxis: SCDs  Code Status: Full code  Family Communication:  patient only  Disposition Plan: Hopeful for discharge home in  48-72 hrs  Consultants:  GI  Procedures:  None  Antimicrobials:  Anti-infectives    None       Subjective: Stable nauseous and with mild epigastric pain, Downgrade to NPO  Objective: Vitals:   08/22/16 2130 08/22/16 2352 08/23/16 0500 08/23/16 1425  BP: 118/72 107/63 95/67 114/74  Pulse: 75 70 68 78  Resp: 20 20 20 20   Temp: 98.4 F (36.9 C) 97.7 F (36.5 C) 97.9 F (36.6 C) 99.1 F (37.3 C)  TempSrc: Oral Oral Oral Oral  SpO2: 100% 100% 100% 100%  Weight:      Height:        Intake/Output Summary (Last 24 hours) at  08/23/16 1652 Last data filed at 08/23/16 0500  Gross per 24 hour  Intake              960 ml  Output                0 ml  Net              960 ml   Filed Weights   08/19/16 1405 08/19/16 2326  Weight: 47.6 kg (105 lb) 49.6 kg (109 lb 6.4 oz)    Examination:  General exam: Alert, awake, oriented x 3 Respiratory system: Clear to auscultation. Respiratory effort normal. Cardiovascular system:RRR. No murmurs, rubs, gallops. Gastrointestinal system: Abdomen is nondistended, soft and nontender. No organomegaly or masses felt. Normal bowel sounds heard. Central nervous system: Alert and oriented. No focal neurological deficits. Extremities: No C/C/E, +pedal pulses Skin: No rashes, lesions or ulcers Psychiatry: Judgement and insight appear normal. Mood & affect appropriate.     Data Reviewed: I have personally reviewed following labs and imaging studies  CBC:  Recent Labs Lab 08/19/16 1447 08/20/16 0429 08/21/16 0425 08/23/16 0729  WBC 14.8* 12.7* 9.6 7.2  NEUTROABS  --  7.6  --   --   HGB 15.5* 10.7* 11.1* 12.2  HCT 44.1 32.8* 32.5* 36.3  MCV 86.8 89.1 88.3 88.3  PLT 493* 348 292 0000000   Basic Metabolic Panel:  Recent Labs Lab 08/19/16 1447 08/20/16 0429 08/21/16 0425 08/22/16 0504 08/23/16 0729  NA 133* 135 139 142 139  K 4.4 3.5 3.2* 3.1* 3.6  CL 96* 107 110 110 105  CO2 22 21* 23 26 28   GLUCOSE 326* 213* 94 56* 143*  BUN 28* 16 9 6  <5*  CREATININE 0.74 0.42* 0.34* 0.32* 0.53  CALCIUM 10.4* 8.3* 8.3* 8.4* 8.6*  MG  --   --   --   --  1.7   GFR: Estimated Creatinine Clearance: 85.6 mL/min (by C-G formula based on SCr of 0.53 mg/dL). Liver Function Tests:  Recent Labs Lab 08/19/16 1447 08/20/16 0429  AST 29 16  ALT 19 13*  ALKPHOS 101 61  BILITOT 1.2 0.7  PROT 9.3* 5.9*  ALBUMIN 5.1* 3.2*    Recent Labs Lab 08/19/16 1447  LIPASE <10*   No results for input(s): AMMONIA in the last 168 hours. Coagulation Profile:  Recent Labs Lab  08/20/16 0429  INR 1.25   Cardiac Enzymes: No results for input(s): CKTOTAL, CKMB, CKMBINDEX, TROPONINI in the last 168 hours. BNP (last 3 results) No results for input(s): PROBNP in the last 8760 hours. HbA1C: No results for input(s): HGBA1C in the last 72 hours. CBG:  Recent Labs Lab 08/22/16 1706 08/22/16 2129 08/23/16 0741 08/23/16 1120 08/23/16 1608  GLUCAP 449* 107* 146* 190* 104*   Lipid Profile: No results for input(s): CHOL, HDL, LDLCALC, TRIG, CHOLHDL, LDLDIRECT in the last 72 hours. Thyroid Function Tests: No results for input(s): TSH, T4TOTAL, FREET4, T3FREE, THYROIDAB in the last 72 hours. Anemia Panel: No results for input(s): VITAMINB12, FOLATE, FERRITIN, TIBC, IRON, RETICCTPCT in the last 72 hours. Urine analysis:    Component Value Date/Time   COLORURINE STRAW (A) 08/20/2016 0230   APPEARANCEUR HAZY (A) 08/20/2016 0230   LABSPEC >1.046 (H) 08/20/2016 0230   PHURINE 6.0 08/20/2016 0230   GLUCOSEU >=500 (A) 08/20/2016 0230   HGBUR NEGATIVE 08/20/2016 0230   BILIRUBINUR NEGATIVE 08/20/2016 0230   KETONESUR 80 (A) 08/20/2016 0230   PROTEINUR NEGATIVE 08/20/2016 0230   UROBILINOGEN 0.2 05/06/2015 0045   NITRITE NEGATIVE 08/20/2016 0230   LEUKOCYTESUR TRACE (A) 08/20/2016 0230   Sepsis Labs: @LABRCNTIP (procalcitonin:4,lacticidven:4)  )No results found for this or any previous visit (from the past 240 hour(s)).       Radiology Studies: No results found.      Scheduled Meds: . cholecalciferol  5,000 Units Oral Daily  . famotidine  20 mg Oral BID  . insulin aspart  0-5 Units Subcutaneous QHS  . insulin aspart  0-9 Units Subcutaneous TID WC  . insulin aspart  3 Units Subcutaneous TID WC  . insulin glargine  18 Units Subcutaneous QHS  . lipase/protease/amylase  48,000 Units Oral TID WC  . pantoprazole  40 mg Oral Daily  . potassium chloride  20 mEq Oral BID  . sodium chloride  1,000 mL Intravenous Once   Continuous Infusions:   LOS: 4  days    Time spent: 25 minutes. Greater than 50% of this time was spent in direct contact with the patient coordinating care.     Lelon Frohlich, MD [facility] Pager [phone]  If 7PM-7AM, please contact night-coverage [email or web address] Password [facility] 08/23/2016, 4:52 PM

## 2016-08-23 NOTE — Progress Notes (Signed)
Late entry:  Patient's IV removed.  AC notified d/t need for ultrasound for new IV.  ICU nurse to assess for US guided placement.  Dr. Jerilee Hoh notified and gave order for one time dose of Phenergan 12.5 mg po.

## 2016-08-23 NOTE — Progress Notes (Signed)
ICU nurse unable to get IV with ultrasound.  Dr. Jerilee Hoh notified via text page.  Vascular Access notified of need for PIV.

## 2016-08-24 LAB — GLUCOSE, CAPILLARY
GLUCOSE-CAPILLARY: 259 mg/dL — AB (ref 65–99)
GLUCOSE-CAPILLARY: 152 mg/dL — AB (ref 65–99)
GLUCOSE-CAPILLARY: 244 mg/dL — AB (ref 65–99)
Glucose-Capillary: 210 mg/dL — ABNORMAL HIGH (ref 65–99)

## 2016-08-24 MED ORDER — SODIUM CHLORIDE 0.9 % IV SOLN
INTRAVENOUS | Status: DC
  Administered 2016-08-24 – 2016-08-25 (×3): via INTRAVENOUS

## 2016-08-24 MED ORDER — SUMATRIPTAN 20 MG/ACT NA SOLN: 20.0000 mg | NASAL | Status: DC | PRN

## 2016-08-24 NOTE — Plan of Care (Signed)
Problem: Pain Managment: Goal: General experience of comfort will improve Outcome: Not Progressing Patient requesting pain medicine every 2-3 hours through out shift.  States pain is at a 7/10 and goes down to a 5/10 after medication.  Also requesting phenergan whenever it can be given again.  Patient asked for clear liquids this evening, stated that she felt she could tolerate them.  Shortly after consuming soda and jello, requesting pain and nausea medicine again.

## 2016-08-24 NOTE — Progress Notes (Signed)
Lantus held per pt/family request.

## 2016-08-24 NOTE — Progress Notes (Signed)
PROGRESS NOTE    Linda Walls  [id number] DOB: July 17, 1990 DOA: 08/19/2016 PCP: [name], MD     Brief Narrative:  Patient is a 26 year old young woman well known to [place] for repeated hospitalizations for acute on chronic pancreatitis she has hereditary pancreatitis. Complained of nausea, vomiting, epigastric pain for 24 hours prior to admission. She states the symptoms are similar to her acute pancreatitis flare. Her lipase was normal.   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 Seen by GI: recommendations are pending regarding her MRCP results. -Still with significant pain and some some nausea. Keep NPO today.  IDDM -Lantus dose has been decreased from 30 units to 18 units due to hypoglycemia likely due to nothing by mouth status. -CBGs improved. Continue current dose of insulin.  DVT prophylaxis: SCDs  Code Status: Full code  Family Communication:  patient only  Disposition Plan: Hopeful for discharge home in  48-72 hrs  Consultants:  GI  Procedures:  None  Antimicrobials:  Anti-infectives    None       Subjective: Stable nauseous and with mild epigastric pain, Downgrade to NPO  Objective: Vitals:   08/23/16 1425 08/23/16 2051 08/24/16 0651 08/24/16 1509  BP: 114/74 111/76 116/66 113/66  Pulse: 78 74 73 85  Resp: 20 20 18 18   Temp: 99.1 F (37.3 C) 99.3 F (37.4 C) 98.4 F (36.9 C) 98.9 F (37.2 C)  TempSrc: Oral Oral Oral Oral  SpO2: 100% 100% 99% 100%  Weight:      Height:        Intake/Output Summary (Last 24 hours) at 08/24/16 1655 Last data  filed at 08/24/16 1300  Gross per 24 hour  Intake              310 ml  Output             1000 ml  Net             -690 ml   Filed Weights   08/19/16 1405 08/19/16 2326  Weight: 47.6 kg (105 lb) 49.6 kg (109 lb 6.4 oz)    Examination:  General exam: Alert, awake, oriented x 3 Respiratory system: Clear to auscultation. Respiratory effort normal. Cardiovascular system:RRR. No murmurs, rubs, gallops. Gastrointestinal system: Abdomen is nondistended, soft and nontender. No organomegaly or masses felt. Normal bowel sounds heard. Central nervous system: Alert and oriented. No focal neurological deficits. Extremities: No C/C/E, +pedal pulses Skin: No rashes, lesions or ulcers Psychiatry: Judgement and insight appear normal. Mood & affect appropriate.     Data Reviewed: I have personally reviewed following labs and imaging studies  CBC:  Recent Labs Lab 08/19/16 1447 08/20/16 0429 08/21/16 0425 08/23/16 0729  WBC 14.8* 12.7* 9.6 7.2  NEUTROABS  --  7.6  --   --   HGB 15.5* 10.7* 11.1* 12.2  HCT 44.1 32.8* 32.5* 36.3  MCV 86.8 89.1 88.3 88.3  PLT 493* 348 292 0000000   Basic Metabolic Panel:  Recent Labs Lab 08/19/16 1447 08/20/16 0429 08/21/16 0425 08/22/16 0504 08/23/16 0729  NA 133* 135 139 142 139  K 4.4 3.5 3.2* 3.1* 3.6  CL 96* 107 110 110 105  CO2 22 21* 23 26 28   GLUCOSE 326* 213* 94 56* 143*  BUN 28* 16 9 6  <5*  CREATININE 0.74 0.42* 0.34* 0.32* 0.53  CALCIUM 10.4* 8.3* 8.3* 8.4* 8.6*  MG  --   --   --   --  1.7   GFR: Estimated Creatinine Clearance: 85.6 mL/min (by C-G formula based on SCr of 0.53 mg/dL). Liver Function Tests:  Recent Labs Lab 08/19/16 1447 08/20/16 0429  AST 29 16  ALT 19 13*  ALKPHOS 101 61  BILITOT 1.2 0.7  PROT 9.3* 5.9*  ALBUMIN 5.1* 3.2*    Recent Labs Lab 08/19/16 1447  LIPASE <10*   No results for input(s): AMMONIA in the last 168 hours. Coagulation Profile:  Recent Labs Lab 08/20/16 0429  INR 1.25    Cardiac Enzymes: No results for input(s): CKTOTAL, CKMB, CKMBINDEX, TROPONINI in the last 168 hours. BNP (last 3 results) No results for input(s): PROBNP in the last 8760 hours. HbA1C: No results for input(s): HGBA1C in the last 72 hours. CBG:  Recent Labs Lab 08/23/16 1608 08/23/16 2039 08/24/16 0737 08/24/16 1124 08/24/16 1609  GLUCAP 104* 103* 259* 152* 210*   Lipid Profile: No results for input(s): CHOL, HDL, LDLCALC, TRIG, CHOLHDL, LDLDIRECT in the last 72 hours. Thyroid Function Tests: No results for input(s): TSH, T4TOTAL, FREET4, T3FREE, THYROIDAB in the last 72 hours. Anemia Panel: No results for input(s): VITAMINB12, FOLATE, FERRITIN, TIBC, IRON, RETICCTPCT in the last 72 hours. Urine analysis:    Component Value Date/Time   COLORURINE STRAW (A) 08/20/2016 0230   APPEARANCEUR HAZY (A) 08/20/2016 0230   LABSPEC >1.046 (H) 08/20/2016 0230   PHURINE 6.0 08/20/2016 0230   GLUCOSEU >=500 (A) 08/20/2016 0230   HGBUR NEGATIVE 08/20/2016 0230   BILIRUBINUR NEGATIVE 08/20/2016 0230   KETONESUR 80 (A) 08/20/2016 0230   PROTEINUR NEGATIVE 08/20/2016 0230   UROBILINOGEN 0.2 05/06/2015 0045   NITRITE NEGATIVE 08/20/2016 0230   LEUKOCYTESUR TRACE (A) 08/20/2016 0230   Sepsis Labs: @LABRCNTIP (procalcitonin:4,lacticidven:4)  )No results found for this or any previous visit (from the past 240 hour(s)).       Radiology Studies: No results found.      Scheduled Meds: . cholecalciferol  5,000 Units Oral Daily  . famotidine  20 mg Oral BID  . insulin aspart  0-5 Units Subcutaneous QHS  . insulin aspart  0-9 Units Subcutaneous TID WC  . insulin aspart  3 Units Subcutaneous TID WC  . insulin glargine  18 Units Subcutaneous QHS  . lipase/protease/amylase  48,000 Units Oral TID WC  . pantoprazole  40 mg Oral Daily  . potassium chloride  20 mEq Oral BID  . sodium chloride  1,000 mL Intravenous Once   Continuous Infusions: . sodium chloride 75 mL/hr at 08/24/16  0852     LOS: 5 days    Time spent: 25 minutes. Greater than 50% of this time was spent in direct contact with the patient coordinating care.     Lelon Frohlich, MD [facility] Pager [phone]  If 7PM-7AM, please contact night-coverage [email or web address] Password [email or web address] 08/24/2016, 4:55 PM

## 2016-08-24 NOTE — Progress Notes (Signed)
Worsening of abdominal pain nausea and vomiting overnight; back to nothing by mouth status.  7 out of 10 abdominal pain -  lessened with IV fentanyl  Vital signs in last 24 hours: Temp:  [98.4 F (36.9 C)-99.3 F (37.4 C)] 98.4 F (36.9 C) (02/25 0651) Pulse Rate:  [73-78] 73 (02/25 0651) Resp:  [18-20] 18 (02/25 0651) BP: (111-116)/(66-76) 116/66 (02/25 0651) SpO2:  [99 %-100 %] 99 % (02/25 0651) Last BM Date: 08/22/16 General:   Alert, pleasant and cooperative in NAD;  somewhat stoic Abdomen:  Nondistended. Bowel sounds present but infrequent. Moderate epigastric tenderness to palpation. Extremities:  Without clubbing or edema.    Intake/Output from previous day: 02/24 0701 - 02/25 0700 In: -  Out: 1000 [Urine:1000] Intake/Output this shift: No intake/output data recorded.  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 Exacerbation of abdominal pain yesterday for which she was made nothing by mouth. No word as of yet from [facility].  Recommendations:  Agree with keeping her nothing by mouth today. Continue analgesic, IV fluid therapy. Hopefully, will hear from [facility] tomorrow regarding long-term management recommendations.

## 2016-08-24 NOTE — Progress Notes (Signed)
Potassium K+ held - per md's instructions and pepcid given IV due to NPO status.

## 2016-08-25 LAB — GLUCOSE, CAPILLARY
GLUCOSE-CAPILLARY: 169 mg/dL — AB (ref 65–99)
Glucose-Capillary: 174 mg/dL — ABNORMAL HIGH (ref 65–99)
Glucose-Capillary: 207 mg/dL — ABNORMAL HIGH (ref 65–99)

## 2016-08-25 NOTE — Progress Notes (Signed)
Report called to [facility] RN Cyril Mourning. Pt transferred via Care link

## 2016-08-25 NOTE — Progress Notes (Signed)
  Subjective:  Patient continues to complain of abdominal pain which is primarily in epigastric region. She has noted much less pain in left midabdomen. She states she gets relief of pain when she leans way forward. She has been intermittently nauseated but no vomiting. She is back on clear liquids.  Objective: Blood pressure 106/63, pulse (!) 106, temperature 98.1 F (36.7 C), temperature source Oral, resp. rate 18, height 5\' 2"  (1.575 m), weight 109 lb 6.4 oz (49.6 kg), last menstrual period 07/29/2016, SpO2 99 %. Patient is alert and sitting upright in bed Cardiac exam with regular rhythm normal S1 and S2. No murmur or gallop noted. Lungs are clear to auscultation. Abdomen is symmetrical and soft with mild to moderate midepigastric tenderness. There is mild tenderness in left midabdomen. No organomegaly or masses.  Labs/studies Results:   Recent Labs  08/23/16 0729  WBC 7.2  HGB 12.2  HCT 36.3  PLT 296    BMET   Recent Labs  08/23/16 0729  NA 139  K 3.6  CL 105  CO2 28  GLUCOSE 143*  BUN <5*  CREATININE 0.53  CALCIUM 8.6*    Serum magnesium from 08/23/2016 was 1.7.  Assessment:  #1. Acute on chronic pancreatitis complicated by diabetes mellitus and EPI. Etiology is well-established and secondary to hereditary pancreatitis diagnosed 5 days before her [age] birthday. She has failed medical therapy. He required analgesic 9 times yesterday and she already has used 3 doses of fentanyl today. CT and MRCP revealed dilated pancreatic duct along with dilated side branches and narrow segment in head of pancreas. Since she is not improving with medical therapy she may be a candidate for pancreatic stenting. Images have been sent to Dr. Jerene Pitch of [facility]. She may need transfer to [facility] for EUS and possible ERCP with pancreatic stenting.  Recommendations:  Will arrange for transfer to [facility] for further evaluation.

## 2016-08-25 NOTE — Progress Notes (Signed)
PROGRESS NOTE    Linda Walls  [id number] DOB: 1991-04-12 DOA: 08/19/2016 PCP: [name], MD     Brief Narrative:  Patient is a 26 year old young woman well known to [place] for repeated hospitalizations for acute on chronic pancreatitis she has hereditary pancreatitis. Complained of nausea, vomiting, epigastric pain for 24 hours prior to admission. She states the symptoms are similar to her acute pancreatitis flare. Her lipase was normal.   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  -Dr. [name] has discussed with her primary GI Dr. Jerene Pitch, who agrees to accept patient in transfer for potential pancreatic stenting -Still with significant pain and some some nausea.  IDDM -Lantus dose has been decreased from 30 units to 18 units due to hypoglycemia likely due to decreased oral intake. -CBGs improved. Continue current dose of insulin.  DVT prophylaxis: SCDs  Code Status: Full code  Family Communication:  patient only  Disposition Plan: Will be transferred to [facility] today Consultants:  GI  Procedures:  None  Antimicrobials:  Anti-infectives    None       Subjective: Stable nauseous and with mild epigastric pain,   Objective: Vitals:   08/24/16 1509 08/24/16 2036 08/25/16 0616 08/25/16 1230  BP: 113/66 (!) 107/59 106/63 100/65  Pulse: 85 87 (!) 106 90  Resp: 18 18 18 20   Temp: 98.9 F (37.2 C) 98.7 F (37.1 C) 98.1 F (36.7 C) 98.3 F (36.8 C)  TempSrc: Oral Oral Oral Oral  SpO2: 100% 100% 99% 100%  Weight:      Height:        Intake/Output Summary (Last 24  hours) at 08/25/16 1749 Last data filed at 08/25/16 0100  Gross per 24 hour  Intake              900 ml  Output                0 ml  Net              900 ml   Filed Weights   08/19/16 1405 08/19/16 2326  Weight: 47.6 kg (105 lb) 49.6 kg (109 lb 6.4 oz)    Examination:  General exam: Alert, awake, oriented x 3 Respiratory system: Clear to auscultation. Respiratory effort normal. Cardiovascular system:RRR. No murmurs, rubs, gallops. Gastrointestinal system: Abdomen is nondistended, soft and nontender. No organomegaly or masses felt. Normal bowel sounds heard. Central nervous system: Alert and oriented. No focal neurological deficits. Extremities: No C/C/E, +pedal pulses Skin: No rashes, lesions or ulcers Psychiatry: Judgement and insight appear normal. Mood & affect appropriate.     Data Reviewed: I have personally reviewed following labs and imaging studies  CBC:  Recent Labs Lab 08/19/16 1447 08/20/16 0429 08/21/16 0425 08/23/16 0729  WBC 14.8* 12.7* 9.6 7.2  NEUTROABS  --  7.6  --   --   HGB 15.5* 10.7* 11.1* 12.2  HCT 44.1 32.8* 32.5* 36.3  MCV 86.8 89.1 88.3 88.3  PLT 493* 348 292 0000000   Basic Metabolic Panel:  Recent Labs Lab 08/19/16 1447 08/20/16 0429 08/21/16 0425 08/22/16 0504 08/23/16 0729  NA 133* 135 139  142 139  K 4.4 3.5 3.2* 3.1* 3.6  CL 96* 107 110 110 105  CO2 22 21* 23 26 28   GLUCOSE 326* 213* 94 56* 143*  BUN 28* 16 9 6  <5*  CREATININE 0.74 0.42* 0.34* 0.32* 0.53  CALCIUM 10.4* 8.3* 8.3* 8.4* 8.6*  MG  --   --   --   --  1.7   GFR: Estimated Creatinine Clearance: 85.6 mL/min (by C-G formula based on SCr of 0.53 mg/dL). Liver Function Tests:  Recent Labs Lab 08/19/16 1447 08/20/16 0429  AST 29 16  ALT 19 13*  ALKPHOS 101 61  BILITOT 1.2 0.7  PROT 9.3* 5.9*  ALBUMIN 5.1* 3.2*    Recent Labs Lab 08/19/16 1447  LIPASE <10*   No results for input(s): AMMONIA in the last 168 hours. Coagulation Profile:  Recent Labs Lab  08/20/16 0429  INR 1.25   Cardiac Enzymes: No results for input(s): CKTOTAL, CKMB, CKMBINDEX, TROPONINI in the last 168 hours. BNP (last 3 results) No results for input(s): PROBNP in the last 8760 hours. HbA1C: No results for input(s): HGBA1C in the last 72 hours. CBG:  Recent Labs Lab 08/24/16 1124 08/24/16 1609 08/24/16 2041 08/25/16 0752 08/25/16 1213  GLUCAP 152* 210* 244* 174* 207*   Lipid Profile: No results for input(s): CHOL, HDL, LDLCALC, TRIG, CHOLHDL, LDLDIRECT in the last 72 hours. Thyroid Function Tests: No results for input(s): TSH, T4TOTAL, FREET4, T3FREE, THYROIDAB in the last 72 hours. Anemia Panel: No results for input(s): VITAMINB12, FOLATE, FERRITIN, TIBC, IRON, RETICCTPCT in the last 72 hours. Urine analysis:    Component Value Date/Time   COLORURINE STRAW (A) 08/20/2016 0230   APPEARANCEUR HAZY (A) 08/20/2016 0230   LABSPEC >1.046 (H) 08/20/2016 0230   PHURINE 6.0 08/20/2016 0230   GLUCOSEU >=500 (A) 08/20/2016 0230   HGBUR NEGATIVE 08/20/2016 0230   BILIRUBINUR NEGATIVE 08/20/2016 0230   KETONESUR 80 (A) 08/20/2016 0230   PROTEINUR NEGATIVE 08/20/2016 0230   UROBILINOGEN 0.2 05/06/2015 0045   NITRITE NEGATIVE 08/20/2016 0230   LEUKOCYTESUR TRACE (A) 08/20/2016 0230   Sepsis Labs: @LABRCNTIP (procalcitonin:4,lacticidven:4)  )No results found for this or any previous visit (from the past 240 hour(s)).       Radiology Studies: No results found.      Scheduled Meds: . cholecalciferol  5,000 Units Oral Daily  . famotidine  20 mg Oral BID  . insulin aspart  0-5 Units Subcutaneous QHS  . insulin aspart  0-9 Units Subcutaneous TID WC  . insulin aspart  3 Units Subcutaneous TID WC  . insulin glargine  18 Units Subcutaneous QHS  . lipase/protease/amylase  48,000 Units Oral TID WC  . pantoprazole  40 mg Oral Daily  . potassium chloride  20 mEq Oral BID  . sodium chloride  1,000 mL Intravenous Once   Continuous Infusions: . sodium  chloride 75 mL/hr at 08/25/16 0915     LOS: 6 days    Time spent: 25 minutes. Greater than 50% of this time was spent in direct contact with the patient coordinating care.     Lelon Frohlich, MD [facility] Pager [phone]  If 7PM-7AM, please contact night-coverage [email or web address] Password [facility] 08/25/2016, 5:49 PM

## 2016-08-25 NOTE — Progress Notes (Signed)
I contacted Dr. Jerene Pitch at  [facility]. I also conferred with Dr.Marapudi, as per list would be accepting patient in transfer. History details provided to her. Images have already been sent via [employer] also provide DVD to the patient just in case.

## 2016-08-28 HISTORY — PX: OTHER SURGICAL HISTORY: SHX169

## 2016-09-03 NOTE — Discharge Summary (Signed)
Linda Walls       CBU:[id number] DOB: [birth year] DOA: 08/19/2016 PCP: [name], MD              Brief Narrative:  Patient is a 26 year old young woman well known to [place] for repeated hospitalizations for acute on chronic pancreatitis she has hereditary pancreatitis. Complained of nausea, vomiting, epigastric pain for 24 hours prior to admission. She states the symptoms are similar to her acute pancreatitis flare. Her lipase was normal.   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  -Dr. [name] has discussed with her primary GI Dr. Jerene Pitch, who agrees to accept patient in transfer for potential pancreatic stenting -Still with significant pain and some some nausea.  IDDM -Lantus dose has been decreased from 30 units to 18 units due to hypoglycemia likely due to decreased oral intake. -CBGs improved. Continue current dose of insulin.  DVT prophylaxis: SCDs  Code Status: Full code  Family Communication:  patient only  Disposition Plan: Will be transferred to [facility] today Consultants:  GI  Procedures:  None  Antimicrobials:     Anti-infectives    None       Subjective: Stable nauseous and with mild epigastric pain,   Objective:       Vitals:   08/24/16 1509 08/24/16 2036 08/25/16 0616 08/25/16 1230  BP: 113/66 (!) 107/59 106/63 100/65  Pulse: 85 87 (!) 106 90  Resp: 18 18 18 20   Temp: 98.9 F (37.2 C) 98.7 F (37.1 C) 98.1 F (36.7 C) 98.3 F (36.8 C)  TempSrc: Oral Oral Oral Oral  SpO2: 100% 100% 99% 100%  Weight:      Height:         Intake/Output Summary (Last 24 hours) at 08/25/16 1749 Last data filed at 08/25/16 0100  Gross per 24 hour  Intake              900 ml  Output                0 ml  Net              900 ml       Filed Weights   08/19/16 1405 08/19/16 2326  Weight: 47.6 kg (105 lb) 49.6 kg (109 lb 6.4 oz)    Examination:  General exam: Alert, awake, oriented x 3 Respiratory system: Clear to auscultation. Respiratory effort normal. Cardiovascular system:RRR. No murmurs, rubs, gallops. Gastrointestinal system: Abdomen is nondistended, soft and nontender. No organomegaly or masses felt. Normal bowel sounds heard. Central nervous system: Alert and oriented. No focal neurological deficits. Extremities: No C/C/E, +pedal pulses Skin: No rashes, lesions or ulcers Psychiatry: Judgement and insight appear normal. Mood & affect appropriate.     Data Reviewed: I have personally reviewed following labs and imaging studies  CBC:  Last Labs    Recent Labs Lab 08/19/16 1447 08/20/16 0429 08/21/16 0425 08/23/16 0729  WBC 14.8* 12.7* 9.6 7.2  NEUTROABS  --  7.6  --   --   HGB 15.5* 10.7* 11.1* 12.2  HCT 44.1 32.8* 32.5* 36.3  MCV 86.8 89.1 88.3 88.3  PLT 493*  348 292 296     Basic Metabolic Panel:  Last Labs    Recent Labs Lab 08/19/16 1447 08/20/16 0429 08/21/16 0425 08/22/16 0504 08/23/16 0729  NA 133* 135 139 142 139  K 4.4 3.5 3.2* 3.1* 3.6  CL 96* 107 110 110 105  CO2 22 21* 23 26 28   GLUCOSE 326* 213* 94 56* 143*  BUN 28* 16 9 6  <5*  CREATININE 0.74 0.42* 0.34* 0.32* 0.53  CALCIUM 10.4* 8.3* 8.3* 8.4* 8.6*  MG  --   --   --   --  1.7     GFR: Estimated Creatinine Clearance: 85.6 mL/min (by C-G formula based on SCr of 0.53 mg/dL). Liver Function Tests:  Last Labs    Recent Labs Lab 08/19/16 1447 08/20/16 0429  AST 29 16  ALT 19 13*  ALKPHOS 101 61  BILITOT 1.2 0.7  PROT 9.3* 5.9*  ALBUMIN 5.1* 3.2*      Last Labs    Recent Labs Lab  08/19/16 1447  LIPASE <10*     Last Labs   No results for input(s): AMMONIA in the last 168 hours.   Coagulation Profile:  Last Labs    Recent Labs Lab 08/20/16 0429  INR 1.25     Cardiac Enzymes: Last Labs   No results for input(s): CKTOTAL, CKMB, CKMBINDEX, TROPONINI in the last 168 hours.   BNP (last 3 results) Recent Labs (within last 365 days)  No results for input(s): PROBNP in the last 8760 hours.   HbA1C: Recent Labs (last 2 labs)   No results for input(s): HGBA1C in the last 72 hours.   CBG:  Last Labs    Recent Labs Lab 08/24/16 1124 08/24/16 1609 08/24/16 2041 08/25/16 0752 08/25/16 1213  GLUCAP 152* 210* 244* 174* 207*     Lipid Profile: Recent Labs (last 2 labs)   No results for input(s): CHOL, HDL, LDLCALC, TRIG, CHOLHDL, LDLDIRECT in the last 72 hours.   Thyroid Function Tests: Recent Labs (last 2 labs)   No results for input(s): TSH, T4TOTAL, FREET4, T3FREE, THYROIDAB in the last 72 hours.   Anemia Panel: Recent Labs (last 2 labs)   No results for input(s): VITAMINB12, FOLATE, FERRITIN, TIBC, IRON, RETICCTPCT in the last 72 hours.   Urine analysis: Labs (Brief)          Component Value Date/Time   COLORURINE STRAW (A) 08/20/2016 0230   APPEARANCEUR HAZY (A) 08/20/2016 0230   LABSPEC >1.046 (H) 08/20/2016 0230   PHURINE 6.0 08/20/2016 0230   GLUCOSEU >=500 (A) 08/20/2016 0230   HGBUR NEGATIVE 08/20/2016 0230   BILIRUBINUR NEGATIVE 08/20/2016 0230   KETONESUR 80 (A) 08/20/2016 0230   PROTEINUR NEGATIVE 08/20/2016 0230   UROBILINOGEN 0.2 05/06/2015 0045   NITRITE NEGATIVE 08/20/2016 0230   LEUKOCYTESUR TRACE (A) 08/20/2016 0230     Sepsis Labs: @LABRCNTIP (procalcitonin:4,lacticidven:4)  )No results found for this or any previous visit (from the past 240 hour(s)).       Radiology Studies: Imaging Results (Last 48 hours)  No results found.        Scheduled Meds: . cholecalciferol   5,000 Units Oral Daily  . famotidine  20 mg Oral BID  . insulin aspart  0-5 Units Subcutaneous QHS  . insulin aspart  0-9 Units Subcutaneous TID WC  . insulin aspart  3 Units Subcutaneous TID WC  . insulin glargine  18 Units Subcutaneous QHS  . lipase/protease/amylase  48,000 Units Oral TID WC  . pantoprazole  40 mg Oral Daily  . potassium chloride  20 mEq Oral BID  . sodium chloride  1,000 mL Intravenous Once   Continuous Infusions: . sodium chloride 75 mL/hr at 08/25/16 0915     LOS: 6 days    Time spent: 25 minutes. Greater than 50% of this time was spent in direct contact with the patient coordinating care.     Lelon Frohlich, MD [facility] Pager [phone]  If 7PM-7AM, please contact night-coverage [email or web address] Password [facility] 08/25/2016, 5:49 PM

## 2016-09-20 ENCOUNTER — Emergency Department (HOSPITAL_COMMUNITY): Payer: BC Managed Care – PPO

## 2016-09-20 ENCOUNTER — Encounter (HOSPITAL_COMMUNITY): Payer: Self-pay | Admitting: Emergency Medicine

## 2016-09-20 ENCOUNTER — Inpatient Hospital Stay (HOSPITAL_COMMUNITY)
Admission: EM | Admit: 2016-09-20 | Discharge: 2016-10-04 | DRG: 439 | Disposition: A | Discharge status: Other Acute Inpatient Hospital | Payer: BC Managed Care – PPO | Attending: Internal Medicine | Admitting: Internal Medicine

## 2016-09-20 DIAGNOSIS — K219 Gastro-esophageal reflux disease without esophagitis: Secondary | ICD-10-CM | POA: Diagnosis present

## 2016-09-20 DIAGNOSIS — R112 Nausea with vomiting, unspecified: Secondary | ICD-10-CM | POA: Diagnosis present

## 2016-09-20 DIAGNOSIS — Z882 Allergy status to sulfonamides status: Secondary | ICD-10-CM | POA: Diagnosis not present

## 2016-09-20 DIAGNOSIS — I471 Supraventricular tachycardia: Secondary | ICD-10-CM | POA: Diagnosis not present

## 2016-09-20 DIAGNOSIS — Z934 Other artificial openings of gastrointestinal tract status: Secondary | ICD-10-CM

## 2016-09-20 DIAGNOSIS — D638 Anemia in other chronic diseases classified elsewhere: Secondary | ICD-10-CM | POA: Diagnosis present

## 2016-09-20 DIAGNOSIS — K589 Irritable bowel syndrome without diarrhea: Secondary | ICD-10-CM | POA: Diagnosis present

## 2016-09-20 DIAGNOSIS — D473 Essential (hemorrhagic) thrombocythemia: Secondary | ICD-10-CM | POA: Diagnosis not present

## 2016-09-20 DIAGNOSIS — Z91013 Allergy to seafood: Secondary | ICD-10-CM | POA: Diagnosis not present

## 2016-09-20 DIAGNOSIS — E08649 Diabetes mellitus due to underlying condition with hypoglycemia without coma: Secondary | ICD-10-CM | POA: Diagnosis present

## 2016-09-20 DIAGNOSIS — R7989 Other specified abnormal findings of blood chemistry: Secondary | ICD-10-CM | POA: Diagnosis present

## 2016-09-20 DIAGNOSIS — E0865 Diabetes mellitus due to underlying condition with hyperglycemia: Secondary | ICD-10-CM | POA: Diagnosis present

## 2016-09-20 DIAGNOSIS — K861 Other chronic pancreatitis: Secondary | ICD-10-CM | POA: Diagnosis present

## 2016-09-20 DIAGNOSIS — Z8249 Family history of ischemic heart disease and other diseases of the circulatory system: Secondary | ICD-10-CM

## 2016-09-20 DIAGNOSIS — K859 Acute pancreatitis without necrosis or infection, unspecified: Principal | ICD-10-CM | POA: Diagnosis present

## 2016-09-20 DIAGNOSIS — Z794 Long term (current) use of insulin: Secondary | ICD-10-CM

## 2016-09-20 DIAGNOSIS — R1030 Lower abdominal pain, unspecified: Secondary | ICD-10-CM

## 2016-09-20 DIAGNOSIS — Z803 Family history of malignant neoplasm of breast: Secondary | ICD-10-CM | POA: Diagnosis not present

## 2016-09-20 DIAGNOSIS — D72829 Elevated white blood cell count, unspecified: Secondary | ICD-10-CM | POA: Diagnosis present

## 2016-09-20 DIAGNOSIS — K8681 Exocrine pancreatic insufficiency: Secondary | ICD-10-CM | POA: Diagnosis present

## 2016-09-20 DIAGNOSIS — G8929 Other chronic pain: Secondary | ICD-10-CM | POA: Diagnosis present

## 2016-09-20 DIAGNOSIS — Z4659 Encounter for fitting and adjustment of other gastrointestinal appliance and device: Secondary | ICD-10-CM

## 2016-09-20 DIAGNOSIS — Z880 Allergy status to penicillin: Secondary | ICD-10-CM

## 2016-09-20 DIAGNOSIS — D75839 Thrombocytosis, unspecified: Secondary | ICD-10-CM | POA: Diagnosis present

## 2016-09-20 DIAGNOSIS — K8689 Other specified diseases of pancreas: Secondary | ICD-10-CM | POA: Diagnosis not present

## 2016-09-20 DIAGNOSIS — R739 Hyperglycemia, unspecified: Secondary | ICD-10-CM

## 2016-09-20 DIAGNOSIS — IMO0002 Reserved for concepts with insufficient information to code with codable children: Secondary | ICD-10-CM | POA: Diagnosis present

## 2016-09-20 DIAGNOSIS — D72823 Leukemoid reaction: Secondary | ICD-10-CM | POA: Diagnosis not present

## 2016-09-20 DIAGNOSIS — Z0189 Encounter for other specified special examinations: Secondary | ICD-10-CM

## 2016-09-20 DIAGNOSIS — E860 Dehydration: Secondary | ICD-10-CM | POA: Diagnosis present

## 2016-09-20 LAB — CBC WITH DIFFERENTIAL/PLATELET
BASOS PCT: 0 %
Basophils Absolute: 0 10*3/uL (ref 0.0–0.1)
EOS ABS: 0 10*3/uL (ref 0.0–0.7)
Eosinophils Relative: 0 %
HCT: 42.5 % (ref 36.0–46.0)
HEMOGLOBIN: 14.9 g/dL (ref 12.0–15.0)
LYMPHS ABS: 1.3 10*3/uL (ref 0.7–4.0)
Lymphocytes Relative: 8 %
MCH: 29.8 pg (ref 26.0–34.0)
MCHC: 35.1 g/dL (ref 30.0–36.0)
MCV: 85 fL (ref 78.0–100.0)
MONO ABS: 0.5 10*3/uL (ref 0.1–1.0)
Monocytes Relative: 3 %
NEUTROS PCT: 89 %
Neutro Abs: 14.2 10*3/uL — ABNORMAL HIGH (ref 1.7–7.7)
PLATELETS: 914 10*3/uL — AB (ref 150–400)
RBC: 5 MIL/uL (ref 3.87–5.11)
RDW: 13.4 % (ref 11.5–15.5)
WBC: 16 10*3/uL — ABNORMAL HIGH (ref 4.0–10.5)

## 2016-09-20 LAB — BASIC METABOLIC PANEL
Anion gap: 16 — ABNORMAL HIGH (ref 5–15)
BUN: 21 mg/dL — ABNORMAL HIGH (ref 6–20)
CALCIUM: 11.1 mg/dL — AB (ref 8.9–10.3)
CO2: 26 mmol/L (ref 22–32)
CREATININE: 0.68 mg/dL (ref 0.44–1.00)
Chloride: 97 mmol/L — ABNORMAL LOW (ref 101–111)
GFR calc non Af Amer: >60 mL/min (ref >60)
Glucose, Bld: 268 mg/dL — ABNORMAL HIGH (ref 65–99)
Potassium: 4.2 mmol/L (ref 3.5–5.1)
SODIUM: 139 mmol/L (ref 135–145)

## 2016-09-20 LAB — URINALYSIS, ROUTINE W REFLEX MICROSCOPIC
Bilirubin Urine: NEGATIVE
GLUCOSE, UA: 150 mg/dL — AB
HGB URINE DIPSTICK: NEGATIVE
Ketones, ur: 80 mg/dL — AB
Leukocytes, UA: NEGATIVE
NITRITE: NEGATIVE
PH: 8 (ref 5.0–8.0)
Protein, ur: 30 mg/dL — AB
SPECIFIC GRAVITY, URINE: 1.03 (ref 1.005–1.030)

## 2016-09-20 LAB — LIPASE, BLOOD: LIPASE: 10 U/L — AB (ref 11–51)

## 2016-09-20 LAB — BLOOD GAS, VENOUS
ACID-BASE EXCESS: 3.4 mmol/L — AB (ref 0.0–2.0)
Bicarbonate: 26.3 mmol/L (ref 20.0–28.0)
FIO2: 21
O2 Saturation: 37.3 %
PCO2 VEN: 34.9 mmHg — AB (ref 44.0–60.0)
Patient temperature: 99.5
pH, Ven: 7.494 — ABNORMAL HIGH (ref 7.250–7.430)

## 2016-09-20 LAB — CBG MONITORING, ED: Glucose-Capillary: 257 mg/dL — ABNORMAL HIGH (ref 65–99)

## 2016-09-20 LAB — GLUCOSE, CAPILLARY: Glucose-Capillary: 202 mg/dL — ABNORMAL HIGH (ref 65–99)

## 2016-09-20 LAB — POC URINE PREG, ED: PREG TEST UR: NEGATIVE

## 2016-09-20 MED ORDER — SODIUM CHLORIDE 0.9 % IV BOLUS (SEPSIS)
1000.0000 mL | Freq: Once | INTRAVENOUS | Status: AC
  Administered 2016-09-20: 1000 mL via INTRAVENOUS

## 2016-09-20 MED ORDER — FAMOTIDINE IN NACL 20-0.9 MG/50ML-% IV SOLN
20.0000 mg | Freq: Two times a day (BID) | INTRAVENOUS | Status: DC
  Administered 2016-09-20 – 2016-09-27 (×14): 20 mg via INTRAVENOUS
  Filled 2016-09-20 (×14): qty 50

## 2016-09-20 MED ORDER — FERROUS SULFATE 325 (65 FE) MG PO TABS
325.0000 mg | ORAL_TABLET | Freq: Every day | ORAL | Status: DC
  Administered 2016-09-21 – 2016-10-03 (×12): 325 mg via ORAL
  Filled 2016-09-20 (×14): qty 1

## 2016-09-20 MED ORDER — FENTANYL CITRATE (PF) 100 MCG/2ML IJ SOLN: 25.0000 ug | INTRAMUSCULAR | Status: DC | PRN

## 2016-09-20 MED ORDER — INSULIN GLARGINE 100 UNIT/ML ~~LOC~~ SOLN
30.0000 [IU] | Freq: Every day | SUBCUTANEOUS | Status: DC
  Administered 2016-09-20: 30 [IU] via SUBCUTANEOUS
  Filled 2016-09-20 (×2): qty 0.3

## 2016-09-20 MED ORDER — IOPAMIDOL (ISOVUE-300) INJECTION 61%
100.0000 mL | Freq: Once | INTRAVENOUS | Status: AC | PRN
  Administered 2016-09-20: 100 mL via INTRAVENOUS

## 2016-09-20 MED ORDER — IOPAMIDOL (ISOVUE-300) INJECTION 61%
INTRAVENOUS | Status: AC
  Administered 2016-09-20: 30 mL via ORAL
  Filled 2016-09-20: qty 30

## 2016-09-20 MED ORDER — FENTANYL CITRATE (PF) 100 MCG/2ML IJ SOLN
50.0000 ug | INTRAMUSCULAR | Status: DC | PRN
  Administered 2016-09-20 – 2016-09-21 (×6): 50 ug via INTRAVENOUS
  Filled 2016-09-20 (×6): qty 2

## 2016-09-20 MED ORDER — PROMETHAZINE HCL 25 MG/ML IJ SOLN
12.5000 mg | Freq: Once | INTRAMUSCULAR | Status: AC
  Administered 2016-09-20: 12.5 mg via INTRAVENOUS
  Filled 2016-09-20: qty 1

## 2016-09-20 MED ORDER — MORPHINE SULFATE (PF) 4 MG/ML IV SOLN
4.0000 mg | Freq: Once | INTRAVENOUS | Status: AC
  Administered 2016-09-20: 4 mg via INTRAVENOUS
  Filled 2016-09-20: qty 1

## 2016-09-20 MED ORDER — VITAMIN D 1000 UNITS PO TABS
1000.0000 [IU] | ORAL_TABLET | Freq: Every day | ORAL | Status: DC
  Administered 2016-09-21 – 2016-10-03 (×12): 1000 [IU] via ORAL
  Filled 2016-09-20 (×15): qty 1

## 2016-09-20 MED ORDER — ONDANSETRON HCL 4 MG/2ML IJ SOLN: 4.0000 mg | Freq: Four times a day (QID) | INTRAMUSCULAR | Status: AC | PRN

## 2016-09-20 MED ORDER — SODIUM CHLORIDE 0.9 % IV SOLN
INTRAVENOUS | Status: AC
  Administered 2016-09-20: via INTRAVENOUS

## 2016-09-20 MED ORDER — HYOSCYAMINE SULFATE 0.125 MG PO TABS
0.1250 mg | ORAL_TABLET | ORAL | Status: DC | PRN
  Filled 2016-09-20: qty 1

## 2016-09-20 MED ORDER — GABAPENTIN 100 MG PO CAPS
100.0000 mg | ORAL_CAPSULE | Freq: Three times a day (TID) | ORAL | Status: DC
  Administered 2016-09-21 – 2016-10-03 (×27): 100 mg via ORAL
  Filled 2016-09-20 (×31): qty 1

## 2016-09-20 MED ORDER — GABAPENTIN 100 MG PO CAPS
200.0000 mg | ORAL_CAPSULE | Freq: Every day | ORAL | Status: DC
  Administered 2016-09-20 – 2016-10-03 (×14): 200 mg via ORAL
  Filled 2016-09-20 (×15): qty 2

## 2016-09-20 MED ORDER — PROMETHAZINE HCL 25 MG/ML IJ SOLN
12.5000 mg | Freq: Four times a day (QID) | INTRAMUSCULAR | Status: DC | PRN
  Administered 2016-09-20 – 2016-10-03 (×44): 12.5 mg via INTRAVENOUS
  Filled 2016-09-20 (×44): qty 1

## 2016-09-20 MED ORDER — ENOXAPARIN SODIUM 40 MG/0.4ML ~~LOC~~ SOLN
40.0000 mg | SUBCUTANEOUS | Status: DC
  Administered 2016-09-21 – 2016-09-30 (×10): 40 mg via SUBCUTANEOUS
  Filled 2016-09-20 (×10): qty 0.4

## 2016-09-20 MED ORDER — OXYCODONE-ACETAMINOPHEN 5-325 MG PO TABS
2.0000 | ORAL_TABLET | ORAL | Status: DC | PRN
  Administered 2016-09-24 – 2016-09-29 (×5): 2 via ORAL
  Filled 2016-09-20 (×5): qty 2

## 2016-09-20 MED ORDER — INSULIN ASPART 100 UNIT/ML ~~LOC~~ SOLN
0.0000 [IU] | SUBCUTANEOUS | Status: DC
  Administered 2016-09-20: 5 [IU] via SUBCUTANEOUS
  Administered 2016-09-21: 2 [IU] via SUBCUTANEOUS
  Administered 2016-09-21: 5 [IU] via SUBCUTANEOUS
  Administered 2016-09-22: 15 [IU] via SUBCUTANEOUS
  Administered 2016-09-23: 3 [IU] via SUBCUTANEOUS

## 2016-09-20 MED ORDER — METOCLOPRAMIDE HCL 5 MG/ML IJ SOLN
10.0000 mg | Freq: Once | INTRAMUSCULAR | Status: AC
  Administered 2016-09-20: 10 mg via INTRAVENOUS
  Filled 2016-09-20: qty 2

## 2016-09-20 MED ORDER — PANCRELIPASE (LIP-PROT-AMYL) 12000-38000 UNITS PO CPEP
36000.0000 [IU] | ORAL_CAPSULE | Freq: Three times a day (TID) | ORAL | Status: DC
  Administered 2016-09-21 – 2016-10-03 (×31): 36000 [IU] via ORAL
  Filled 2016-09-20 (×4): qty 3
  Filled 2016-09-20: qty 1
  Filled 2016-09-20 (×9): qty 3
  Filled 2016-09-20: qty 1
  Filled 2016-09-20 (×8): qty 3
  Filled 2016-09-20: qty 1
  Filled 2016-09-20 (×2): qty 3
  Filled 2016-09-20: qty 1
  Filled 2016-09-20 (×3): qty 3
  Filled 2016-09-20: qty 1
  Filled 2016-09-20 (×6): qty 3

## 2016-09-20 NOTE — ED Triage Notes (Signed)
Pt reports hyperglycemia for the past 2 days.  States she has been taking her insulin, but has had nausea and vomiting with abd pain for 2 days.

## 2016-09-20 NOTE — ED Notes (Signed)
CRITICAL VALUE ALERT  Critical value received:  Platelets [id number]  Date of notification:  09/20/16  Time of notification:  1250  Critical value read back:Yes.    Nurse who received alert:  c Amay Mijangos rn  Responding MD: Long  Time MD responded:  [id number]

## 2016-09-20 NOTE — H&P (Signed)
History and Physical    CHESTER ROMERO ZOX:[id number] DOB: Dec 10, 1990 DOA: 09/20/2016  PCP: [name], MD   Patient coming from: Home  Chief Complaint: Abdominal pain, nausea, vomiting  HPI: Linda Walls is a 26 y.o. female with medical history significant for hereditary pancreatitis with pancreatic insufficiency and secondary diabetes mellitus, GERD, and migraines who presents to the emergency department with abdominal pain, nausea, and nonbloody vomiting. Patient has history of remote pancreatic duct stent and had multiple recent admissions for recurrent flare in pancreatitis with imaging concerning for pancreatic duct obstruction, possibly from stricture. She was discharged from this hospital one month ago after conservative management for acute on chronic pancreatitis, and transferred to [place] where Dr. Jerene Pitch placed a pancreatic duct stent. After several days, the patient was able to resume her diet and was doing quite well until approximately 2 days ago when she noted the insidious development of pain in the mid abdomen with nausea and recurrent nonbloody vomiting. She denies any fevers or chills and denies diarrhea. She describes the pain as severe, sharp, localized to the mid abdomen, constant, worse with food, relieved only minimally with vomiting, and with no other alleviating or exacerbating factors identified. Patient denies any chest pain or palpitations and denies cough or dyspnea. There has been no melena or hematochezia.  ED Course: Upon arrival to the ED, patient is found to be afebrile, saturating well on room air, and with vital signs stable. Chemistry panel is notable for an elevated BUN to creatinine ratio and serum glucose 268. CBC features a leukocytosis to 16,000 and thrombocytosis to 914,000. Lipase is only 10, urine pregnancy test is negative, and urinalysis is consistent with dehydration, but not infection. CT of the abdomen and pelvis was obtained  and is notable for interval placement of pancreatic duct stent with decompression of the previously dilated duct. CT is negative for obstruction or abscess. Patient was given 3 L normal saline, Reglan, 2 doses of IV morphine, and multiple doses of IV Phenergan. She demonstrated improvement with fluids and analgesia in the emergency department but continues be intolerant of oral fluids and will be admitted to the medical/surgical unit for ongoing evaluation and management of abdominal pain with intractable nausea and vomiting suspected secondary to acute on chronic pancreatitis.  Review of Systems:  All other systems reviewed and apart from HPI, are negative.  Past Medical History:  Diagnosis Date  . DKA (diabetic ketoacidoses) ([place])   . DM type 1 (diabetes mellitus, type 1) ([place])   . GERD (gastroesophageal reflux disease)   . Hereditary pancreatitis   . Hernia   . IBS (irritable bowel syndrome)   . Jaundice   . Migraines     Past Surgical History:  Procedure Laterality Date  . BILE DUCT STENT PLACEMENT  2006 at [place] 6 months after it was placed.  Marland Kitchen HERNIA REPAIR     As infant.  Marland Kitchen PANCREATIC CYST DRAINAGE  at age 58 year     reports that she has never smoked. She has never used smokeless tobacco. She reports that she does not drink alcohol or use drugs.  Allergies  Allergen Reactions  . Shellfish Allergy Nausea And Vomiting  . Cefzil [Cefprozil] Rash  . Penicillins Rash    Has patient had a PCN reaction causing immediate rash, facial/tongue/throat swelling, SOB or lightheadedness with hypotension: NO Has patient had a PCN reaction causing severe rash involving mucus membranes or skin necrosis: NO Has patient had  a PCN reaction that required hospitalization: NO Has patient had a PCN reaction occurring within the last 10 years: NO If all of the above answers are "NO", then may proceed with Cephalosporin use.   . Sulfa Antibiotics Rash    Family History  Problem  Relation Age of Onset  . Pancreatitis Father 30    hereditary  . Hypertension Father   . GER disease Father   . Hypothyroidism Mother   . Breast cancer Maternal Aunt   . Congenital heart disease Maternal Grandfather   . Prostate cancer Maternal Grandfather   . Alzheimer's disease Maternal Grandfather      Prior to Admission medications   Medication Sig Start Date End Date Taking? Authorizing Provider  Cholecalciferol (VITAMIN D-1000 MAX ST) 1000 units tablet Take 1,000 Units by mouth daily.    Yes Historical Provider, MD  CREON 24000-76000 units CPEP Take two capsules with meals and one with snacks. 07/15/16  Yes Historical Provider, MD  ferrous sulfate (FERROUSUL) 325 (65 FE) MG tablet Take 325 mg by mouth daily with breakfast.    Yes Historical Provider, MD  gabapentin (NEURONTIN) 100 MG capsule Take 100mg  by mouth three times a day and 200mg  (2 caps) by mouth at bedtime 09/13/16  Yes Historical Provider, MD  hyoscyamine (LEVSIN, ANASPAZ) 0.125 MG tablet Take 0.125 mg by mouth every 4 (four) hours as needed for bladder spasms or cramping.  08/02/16  Yes Historical Provider, MD  insulin aspart (NOVOLOG) 100 UNIT/ML injection Inject 12 Units into the skin 3 (three) times daily before meals. Use sliding scale if sugar is running high.   Yes Historical Provider, MD  insulin degludec (TRESIBA FLEXTOUCH) 100 UNIT/ML SOPN FlexTouch Pen Inject 30 Units into the skin daily at 10 pm.   Yes Historical Provider, MD  omeprazole (PRILOSEC) 20 MG capsule Take 20 mg by mouth daily.  08/02/16  Yes Historical Provider, MD  oxyCODONE-acetaminophen (PERCOCET) 10-325 MG tablet Take 1 tablet by mouth every 4 (four) hours as needed.  09/03/16  Yes Historical Provider, MD  SUMAtriptan (IMITREX) 50 MG tablet Take 50 mg by mouth every 2 (two) hours as needed for migraine.  08/15/16  Yes Historical Provider, MD  vitamin C (ASCORBIC ACID) 500 MG tablet Take 500 mg by mouth.   Yes Historical Provider, MD    Physical  Exam: Vitals:   09/20/16 1315 09/20/16 1330 09/20/16 1345 09/20/16 1400  BP:  122/87  (!) 142/93  Pulse:      Resp: 16 17 13 16   Temp:      TempSrc:      SpO2:      Weight:      Height:          Constitutional: NAD, calm, in apparent discomfort Eyes: PERTLA, lids and conjunctivae normal ENMT: Mucous membranes are moist. Posterior pharynx clear of any exudate or lesions.   Neck: normal, supple, no masses, no thyromegaly Respiratory: clear to auscultation bilaterally, no wheezing, no crackles. Normal respiratory effort.   Cardiovascular: Rate ~110 and regular. No extremity edema. 2+ pedal pulses. No significant JVD. Abdomen: No distension, soft, no masses palpated. Tender in epigastrium and mid-abd. No rebound pain or guarding. Bowel sounds active.  Musculoskeletal: no clubbing / cyanosis. No joint deformity upper and lower extremities. Normal muscle tone.  Skin: no significant rashes, lesions, ulcers. Warm, dry, well-perfused. Poor turgor. Neurologic: CN 2-12 grossly intact. Sensation intact, DTR normal. Strength 5/5 in all 4 limbs.  Psychiatric: Normal judgment and insight. Alert and  oriented x 3. Normal mood and affect.     Labs on Admission: I have personally reviewed following labs and imaging studies  CBC:  Recent Labs Lab 09/20/16 1135  WBC 16.0*  NEUTROABS 14.2*  HGB 14.9  HCT 42.5  MCV 85.0  PLT 220*   Basic Metabolic Panel:  Recent Labs Lab 09/20/16 1135  NA 139  K 4.2  CL 97*  CO2 26  GLUCOSE 268*  BUN 21*  CREATININE 0.68  CALCIUM 11.1*   GFR: Estimated Creatinine Clearance: 86.2 mL/min (by C-G formula based on SCr of 0.68 mg/dL). Liver Function Tests: No results for input(s): AST, ALT, ALKPHOS, BILITOT, PROT, ALBUMIN in the last 168 hours.  Recent Labs Lab 09/20/16 1135  LIPASE 10*   No results for input(s): AMMONIA in the last 168 hours. Coagulation Profile: No results for input(s): INR, PROTIME in the last 168 hours. Cardiac  Enzymes: No results for input(s): CKTOTAL, CKMB, CKMBINDEX, TROPONINI in the last 168 hours. BNP (last 3 results) No results for input(s): PROBNP in the last 8760 hours. HbA1C: No results for input(s): HGBA1C in the last 72 hours. CBG:  Recent Labs Lab 09/20/16 1125  GLUCAP 257*   Lipid Profile: No results for input(s): CHOL, HDL, LDLCALC, TRIG, CHOLHDL, LDLDIRECT in the last 72 hours. Thyroid Function Tests: No results for input(s): TSH, T4TOTAL, FREET4, T3FREE, THYROIDAB in the last 72 hours. Anemia Panel: No results for input(s): VITAMINB12, FOLATE, FERRITIN, TIBC, IRON, RETICCTPCT in the last 72 hours. Urine analysis:    Component Value Date/Time   COLORURINE YELLOW 09/20/2016 1428   APPEARANCEUR HAZY (A) 09/20/2016 1428   LABSPEC 1.030 09/20/2016 1428   PHURINE 8.0 09/20/2016 1428   GLUCOSEU 150 (A) 09/20/2016 1428   HGBUR NEGATIVE 09/20/2016 1428   BILIRUBINUR NEGATIVE 09/20/2016 1428   KETONESUR 80 (A) 09/20/2016 1428   PROTEINUR 30 (A) 09/20/2016 1428   UROBILINOGEN 0.2 05/06/2015 0045   NITRITE NEGATIVE 09/20/2016 1428   LEUKOCYTESUR NEGATIVE 09/20/2016 1428   Sepsis Labs: @LABRCNTIP (procalcitonin:4,lacticidven:4) )No results found for this or any previous visit (from the past 240 hour(s)).   Radiological Exams on Admission: Ct Abdomen Pelvis W Contrast  Result Date: 09/20/2016 CLINICAL DATA:  PMHx of DM on insulin, DKA, GERD, and IBS, who presents to the Emergency Department for an evaluation of hyperglycemia that began 2 days ago. Pt reports she noticed her blood sugar "spiked" yesterday with associated left-sided abdominal pain, nausea, vomiting. History of DKA. EXAM: CT ABDOMEN AND PELVIS WITH CONTRAST TECHNIQUE: Multidetector CT imaging of the abdomen and pelvis was performed using the standard protocol following bolus administration of intravenous contrast. CONTRAST:  21mL ISOVUE-300 IOPAMIDOL (ISOVUE-300) INJECTION 61%, 181mL ISOVUE-300 IOPAMIDOL (ISOVUE-300)  INJECTION 61% COMPARISON:  08/20/2016, 08/19/2016 FINDINGS: Lower chest: No acute abnormality. Hepatobiliary: The liver is homogeneous. No focal liver lesion. Gallbladder is present and normal in CT appearance. Pancreas: Pancreatic duct stent has been placed since prior exams, decompressing the previously dilated pancreatic duct. No evidence for pancreatic mass. Spleen: Normal in size without focal abnormality. Left upper quadrant splenule is present in the region of the splenic hilum. Adrenals/Urinary Tract: Normal adrenal glands. Normal appearance of both kidneys. No hydronephrosis or ureteral abnormality. Stomach/Bowel: The stomach and small bowel loops are normal in appearance. Colonic loops are normal in appearance. Appendix is not seen. Vascular/Lymphatic: No significant vascular findings are present. No enlarged abdominal or pelvic lymph nodes. Reproductive: The uterus is present.  No adnexal mass. Other: No abdominal wall hernia or abnormality. No  abdominopelvic ascites. Musculoskeletal: No acute or significant osseous findings. IMPRESSION: 1. Interval placement of pancreatic duct stent, decompressing the previously dilated pancreatic duct. 2. No bowel obstruction or evidence for abscess. Electronically Signed   By: Nolon Nations M.D.   On: 09/20/2016 16:25    EKG: Not performed, will obtain as appropriate.   Assessment/Plan  1. Acute on chronic pancreatitis  - Pt with hx of hereditary pancreatitis, had developed pancreatic duct obstruction which was relieved by stent placed last month at [facility], now p/w sxs of recurrent pancreatitis  - CT demonstrates appropriate placement of stent with decompression of previously dilated pancreatic duct, and is negative for obstruction or abscess  - Plan for bowel rest, IVF hydration, prn antiemetics and analgesia   2. Nausea and vomiting with dehydration  - Likely secondary to acute on chronic pancreatitis and will be managed as above  - CT abd/pelvis  without obstruction or focus of infection - UA not suggestive of infection; no diarrhea  - Continue IVF, antiemetics, monitor lytes, Pepcid   3. Insulin-dependent DM  - A1c was 10.7% in January 2018  - Secondary to chronic pancreatic disease - Managed at home with Tresiba 30 units qHS and Novolog 12 units TID  - Check CBG q4h until tolerating a diet - Lantus 30 units qHS, moderate-intensity SSI     DVT prophylaxis: sq Lovenox  Code Status: Full  Family Communication: Discussed with patient Disposition Plan: Admit to med-surg Consults called: None Admission status: Inpatient    Vianne Bulls, MD [facility] Pager [phone]  If 7PM-7AM, please contact night-coverage [email or web address] Password [facility]  09/20/2016, 5:15 PM

## 2016-09-20 NOTE — ED Notes (Signed)
I-stat beta HCG cancelled, pt urinated and POC pregnancy was performed.  Test resulted negative.

## 2016-09-20 NOTE — ED Notes (Signed)
Pt states that she is nauseous and has been vomiting while being assessed.  Pt is also in pain and states that her stomach is hurting.

## 2016-09-20 NOTE — ED Notes (Signed)
CRITICAL VALUE ALERT  Critical value received:  Venous Blood Gas  pH:  7.49 CO2:  34.9 PO2:  Below reportable range HCO3:  26.3  Date of notification:  09/20/2016  Time of notification:  1236  Critical value read back:  Yes  Nurse who received alert:  Cena Benton  MD notified (1st page):  Dr. Laverta Baltimore  Time of first page:  1237  Responding MD:  Dr. Laverta Baltimore  Time MD responded:  [phone]

## 2016-09-20 NOTE — ED Notes (Signed)
Report given to Tamika RN.

## 2016-09-20 NOTE — ED Provider Notes (Signed)
Emergency Department Provider Note  By signing my name below, I, [name], attest that this documentation has been prepared under the direction and in the presence of Margette Fast, MD . Electronically Signed: [name], Scribe. 09/20/2016. 7:41 PM.  I have reviewed the triage vital signs and the nursing notes.  HISTORY  Chief Complaint Hyperglycemia  HPI Comments: Linda Walls is a 26 y.o. female with PMHx of DM on insulin, DKA, GERD, and IBS, who presents to the Emergency Department for an evaluation of hyperglycemia that began 2 days ago. Pt reports she noticed her blood sugar "spiked" yesterday with associated left-sided abdominal pain, nausea, and multiple episodes of vomiting. She notes hx of DKA and states her current symptoms feel similar; she states her last episode occurred on 07/01/16. Per mother, pt was given 3 units of Insulin at ~8:45 AM this morning and one dose of Zofran with mild relief of her symptoms. Pt notes she uses a sliding scale of Insulin for meals and takes one dose of Lantus every night. She denies any recent changes in her Insulin delivery, chest pain, shortness of breath, diarrhea, vaginal bleeding, vaginal discharge, fever, and chills.    Past Medical History:  Diagnosis Date  . DKA (diabetic ketoacidoses) ([place])   . DM type 1 (diabetes mellitus, type 1) ([place])   . GERD (gastroesophageal reflux disease)   . Hereditary pancreatitis   . Hernia   . IBS (irritable bowel syndrome)   . Jaundice   . Migraines     Patient Active Problem List   Diagnosis Date Noted  . Acute on chronic pancreatitis ([place]) 09/20/2016  . 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 Polycythemia   . Pancreatitis 01/02/2016  . Uncontrolled diabetes mellitus secondary to pancreatic insufficiency ([place]) 06/06/2015  . 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 Pancreatic insufficiency 05/23/2015  . Vitamin D deficiency 05/23/2015  . DKA (diabetic ketoacidoses) ([place])  05/06/2015  . Intractable nausea and vomiting 04/16/2015  . Abdominal pain, left lower quadrant 04/16/2015  . Leukocytosis 04/16/2015  . Diabetic ketoacidosis without coma associated with type 1 diabetes mellitus ([place])   . Migraine headache 10/27/2014  . Abdominal pain, chronic, epigastric 08/15/2013  . Pyelonephritis 03/06/2013  . Acute gastritis 12/23/2012  . Nausea and vomiting 05/01/2012  . Hypoglycemia associated with diabetes ([place]) 05/01/2012  . GERD (gastroesophageal reflux disease) 04/29/2012  . Anemia 05/19/2011  . Hereditary pancreatitis 05/18/2011  . Hyponatremia 05/18/2011  . Hypokalemia 05/18/2011  . Sinus tachycardia ([place]) 05/18/2011  . Dehydration 05/18/2011  . Thrombocytosis ([place]) 05/18/2011    Past Surgical History:  Procedure Laterality Date  . BILE DUCT STENT PLACEMENT  2006 at [place] 6 months after it was placed.  Marland Kitchen HERNIA REPAIR     As infant.  Marland Kitchen PANCREATIC CYST DRAINAGE  at age 69 year   Allergies Shellfish allergy; Cefzil [cefprozil]; Penicillins; and Sulfa antibiotics  Family History  Problem Relation Age of Onset  . Pancreatitis Father 30    hereditary  . Hypertension Father   . GER disease Father   . Hypothyroidism Mother   . Breast cancer Maternal Aunt   . Congenital heart disease Maternal Grandfather   . Prostate cancer Maternal Grandfather   . Alzheimer's disease Maternal Grandfather     Social History Social History  Substance Use Topics  . Smoking status: Never Smoker  . Smokeless tobacco: Never Used  . Alcohol use No    Review of Systems  Constitutional: No fever/chills Eyes:  No visual changes. ENT: No sore throat. Cardiovascular: Denies chest pain. Respiratory: Denies shortness of breath. Gastrointestinal: Positive left sided abdominal pain. Positive nausea and vomiting.  No diarrhea.  No constipation. Genitourinary: Negative for dysuria. Musculoskeletal: Negative for back pain. Skin: Negative for  rash. Neurological: Negative for headaches, focal weakness or numbness.  10-point ROS otherwise negative.  ____________________________________________   PHYSICAL EXAM:  VITAL SIGNS: ED Triage Vitals  Enc Vitals Group     BP 09/20/16 1117 113/79     Pulse Rate 09/20/16 1117 99     Resp 09/20/16 1117 18     Temp 09/20/16 1117 99 F (37.2 C)     Temp Source 09/20/16 1117 Oral     SpO2 09/20/16 1117 100 %     Weight 09/20/16 1115 110 lb (49.9 kg)     Height 09/20/16 1115 5\' 2"  (1.575 m)     Pain Score 09/20/16 1115 7   Constitutional: Alert and oriented. Pale-appearing with dry mucous membranes.  Eyes: Conjunctivae are normal.  Head: Atraumatic. Nose: No congestion/rhinnorhea. Mouth/Throat: Mucous membranes are dry. Oropharynx non-erythematous. Neck: No stridor Cardiovascular: Sinus tachycardia. Good peripheral circulation. Grossly normal heart sounds.   Respiratory: Normal respiratory effort.  No retractions. Lungs CTAB. Gastrointestinal: Soft with left sided abdominal tenderness to palpation L > R. No rebound or guarding. No distention.  Musculoskeletal: No lower extremity tenderness nor edema. No gross deformities of extremities. Neurologic:  Normal speech and language. No gross focal neurologic deficits are appreciated.  Skin:  Skin is warm, dry and intact. No rash noted. Psychiatric: Mood and affect are normal. Speech and behavior are normal.  ____________________________________________   LABS (all labs ordered are listed, but only abnormal results are displayed)  Labs Reviewed  BASIC METABOLIC PANEL - Abnormal; Notable for the following:       Result Value   Chloride 97 (*)    Glucose, Bld 268 (*)    BUN 21 (*)    Calcium 11.1 (*)    Anion gap 16 (*)    All other components within normal limits  CBC WITH DIFFERENTIAL/PLATELET - Abnormal; Notable for the following:    WBC 16.0 (*)    Platelets 914 (*)    Neutro Abs 14.2 (*)    All other components within  normal limits  URINALYSIS, ROUTINE W REFLEX MICROSCOPIC - Abnormal; Notable for the following:    APPearance HAZY (*)    Glucose, UA 150 (*)    Ketones, ur 80 (*)    Protein, ur 30 (*)    Bacteria, UA RARE (*)    Squamous Epithelial / LPF 6-30 (*)    All other components within normal limits  BLOOD GAS, VENOUS - Abnormal; Notable for the following:    pH, Ven 7.494 (*)    pCO2, Ven 34.9 (*)    Acid-Base Excess 3.4 (*)    All other components within normal limits  LIPASE, BLOOD - Abnormal; Notable for the following:    Lipase 10 (*)    All other components within normal limits  CBG MONITORING, ED - Abnormal; Notable for the following:    Glucose-Capillary 257 (*)    All other components within normal limits  PATHOLOGIST SMEAR REVIEW  CBC WITH DIFFERENTIAL/PLATELET  COMPREHENSIVE METABOLIC PANEL  HEMOGLOBIN A1C  POC URINE PREG, ED   ____________________________________________  RADIOLOGY  CT abdomen pending ____________________________________________   PROCEDURES  Procedure(s) performed:   Procedures   None ____________________________________________   INITIAL IMPRESSION / ASSESSMENT AND PLAN /  ED COURSE  Pertinent labs & imaging results that were available during my care of the patient were reviewed by me and considered in my medical decision making (see chart for details).  Patient presents to the emergency department for evaluation of left-sided abdominal discomfort and intractable nausea and vomiting. She has had multiple presentations for DKA. Patient also has a history of hereditary pancreatitis and earlier this month had a stent placed in her pancreatic duct at [facility] the procedure well. Unclear if this represents DKA presentation vs pancreatitis. Will obtain labs and start with IVF and reassess.   No evidence of DKA. Patient with continued nausea and vomiting. Plan for CT abdomen and pelvis to evaluation left abdominal pain and evaluate  pancreatic duct.   Care transferred to Dr. Roderic Palau who will follow CT results and likely admit. Appreciate assistance with disposition.  ____________________________________________  FINAL CLINICAL IMPRESSION(S) / ED DIAGNOSES  Final diagnoses:  Hyperglycemia  Lower abdominal pain     MEDICATIONS GIVEN DURING THIS VISIT:  Medications  0.9 %  sodium chloride infusion (not administered)  ondansetron (ZOFRAN) injection 4 mg (not administered)  oxyCODONE-acetaminophen (PERCOCET/ROXICET) 5-325 MG per tablet 2 tablet (not administered)  fentaNYL (SUBLIMAZE) injection 50 mcg (not administered)  promethazine (PHENERGAN) injection 12.5 mg (not administered)  cholecalciferol (VITAMIN D) tablet 1,000 Units (not administered)  lipase/protease/amylase (CREON) capsule 36,000 Units (not administered)  ferrous sulfate tablet 325 mg (not administered)  gabapentin (NEURONTIN) capsule 200 mg (not administered)  hyoscyamine (LEVSIN, ANASPAZ) tablet 0.125 mg (not administered)  enoxaparin (LOVENOX) injection 40 mg (not administered)  insulin glargine (LANTUS) injection 30 Units (not administered)  insulin aspart (novoLOG) injection 0-15 Units (not administered)  gabapentin (NEURONTIN) capsule 100 mg (not administered)  famotidine (PEPCID) IVPB 20 mg premix (not administered)  sodium chloride 0.9 % bolus 1,000 mL (0 mLs Intravenous Stopped 09/20/16 1309)  promethazine (PHENERGAN) injection 12.5 mg (12.5 mg Intravenous Given 09/20/16 1158)  sodium chloride 0.9 % bolus 1,000 mL (0 mLs Intravenous Stopped 09/20/16 1407)  metoCLOPramide (REGLAN) injection 10 mg (10 mg Intravenous Given 09/20/16 1307)  morphine 4 MG/ML injection 4 mg (4 mg Intravenous Given 09/20/16 1308)  iopamidol (ISOVUE-300) 61 % injection (30 mLs Oral Contrast Given 09/20/16 1315)  iopamidol (ISOVUE-300) 61 % injection 100 mL (100 mLs Intravenous Contrast Given 09/20/16 1541)  sodium chloride 0.9 % bolus 1,000 mL (1,000 mLs Intravenous New  Bag/Given 09/20/16 1707)  morphine 4 MG/ML injection 4 mg (4 mg Intravenous Given 09/20/16 1706)  promethazine (PHENERGAN) injection 12.5 mg (12.5 mg Intravenous Given 09/20/16 1706)     NEW OUTPATIENT MEDICATIONS STARTED DURING THIS VISIT:  None   Note:  This document was prepared using Dragon voice recognition software and may include unintentional dictation errors.  Nanda Quinton, MD Emergency Medicine  I personally performed the services described in this documentation, which was scribed in my presence. The recorded information has been reviewed and is accurate.      Margette Fast, MD 09/20/16 [phone]

## 2016-09-21 LAB — GLUCOSE, CAPILLARY
GLUCOSE-CAPILLARY: 100 mg/dL — AB (ref 65–99)
GLUCOSE-CAPILLARY: 103 mg/dL — AB (ref 65–99)
GLUCOSE-CAPILLARY: 221 mg/dL — AB (ref 65–99)
GLUCOSE-CAPILLARY: 40 mg/dL — AB (ref 65–99)
GLUCOSE-CAPILLARY: 77 mg/dL (ref 65–99)
Glucose-Capillary: 123 mg/dL — ABNORMAL HIGH (ref 65–99)
Glucose-Capillary: 56 mg/dL — ABNORMAL LOW (ref 65–99)
Glucose-Capillary: 143 mg/dL — ABNORMAL HIGH (ref 65–99)

## 2016-09-21 LAB — COMPREHENSIVE METABOLIC PANEL
ALT: 15 U/L (ref 14–54)
AST: 27 U/L (ref 15–41)
Albumin: 3.5 g/dL (ref 3.5–5.0)
Alkaline Phosphatase: 76 U/L (ref 38–126)
Anion gap: 6 (ref 5–15)
BUN: 14 mg/dL (ref 6–20)
CHLORIDE: 110 mmol/L (ref 101–111)
CO2: 22 mmol/L (ref 22–32)
CREATININE: 0.45 mg/dL (ref 0.44–1.00)
Calcium: 9 mg/dL (ref 8.9–10.3)
Glucose, Bld: 74 mg/dL (ref 65–99)
POTASSIUM: 3.9 mmol/L (ref 3.5–5.1)
Sodium: 138 mmol/L (ref 135–145)
TOTAL PROTEIN: 6.8 g/dL (ref 6.5–8.1)
Total Bilirubin: 1.2 mg/dL (ref 0.3–1.2)

## 2016-09-21 LAB — CBC WITH DIFFERENTIAL/PLATELET
Basophils Absolute: 0 10*3/uL (ref 0.0–0.1)
Basophils Relative: 1 %
EOS PCT: 3 %
Eosinophils Absolute: 0.3 10*3/uL (ref 0.0–0.7)
HCT: 36.1 % (ref 36.0–46.0)
Hemoglobin: 11.9 g/dL — ABNORMAL LOW (ref 12.0–15.0)
LYMPHS ABS: 3.5 10*3/uL (ref 0.7–4.0)
LYMPHS PCT: 41 %
MCH: 29.1 pg (ref 26.0–34.0)
MCHC: 33 g/dL (ref 30.0–36.0)
MCV: 88.3 fL (ref 78.0–100.0)
MONO ABS: 0.6 10*3/uL (ref 0.1–1.0)
Monocytes Relative: 7 %
NEUTROS ABS: 4.1 10*3/uL (ref 1.7–7.7)
Neutrophils Relative %: 48 %
PLATELETS: 628 10*3/uL — AB (ref 150–400)
RBC: 4.09 MIL/uL (ref 3.87–5.11)
RDW: 13.8 % (ref 11.5–15.5)
WBC: 8.5 10*3/uL (ref 4.0–10.5)

## 2016-09-21 MED ORDER — INSULIN GLARGINE 100 UNIT/ML ~~LOC~~ SOLN
15.0000 [IU] | Freq: Every day | SUBCUTANEOUS | Status: DC
  Administered 2016-09-21 – 2016-09-30 (×9): 15 [IU] via SUBCUTANEOUS
  Filled 2016-09-21 (×10): qty 0.15

## 2016-09-21 MED ORDER — DEXTROSE 50 % IV SOLN
INTRAVENOUS | Status: AC
  Administered 2016-09-21: 25 mL via INTRAVENOUS
  Filled 2016-09-21: qty 50

## 2016-09-21 MED ORDER — SODIUM CHLORIDE 0.9 % IV SOLN
INTRAVENOUS | Status: DC
  Administered 2016-09-21 – 2016-09-28 (×16): via INTRAVENOUS

## 2016-09-21 MED ORDER — FENTANYL CITRATE (PF) 100 MCG/2ML IJ SOLN
75.0000 ug | INTRAMUSCULAR | Status: DC | PRN
  Administered 2016-09-21 – 2016-09-29 (×79): 75 ug via INTRAVENOUS
  Filled 2016-09-21 (×79): qty 2

## 2016-09-21 MED ORDER — DEXTROSE 50 % IV SOLN
25.0000 mL | Freq: Once | INTRAVENOUS | Status: AC
  Administered 2016-09-21: 25 mL via INTRAVENOUS

## 2016-09-21 NOTE — Progress Notes (Signed)
PROGRESS NOTE    Linda Walls  VWU:[id number] DOB: 25-Mar-1991 DOA: 09/20/2016 PCP: [name], MD     Brief Narrative:  26 y/o young woman well known for hereditary pancreatitis and frequent admission for same; here with abdominal pain which she recognizes as a pancreatitis flare. Admission requested.   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 3 weeks and has been home for about 1 week. -She requests clears today and states that her pain is somewhat improved.  DM due to pancreatic insuffiencey -With labile CBGs. -Decrease lantus to 15 from 30.. -Monitor CBGs.   DVT prophylaxis: lovenox Code Status: full code Family Communication: mother at bedside Disposition Plan: home when ready  Consultants:   GI  Procedures:   None  Antimicrobials:  Anti-infectives    None       Subjective: Still with pain altho a little better. Wants some liquids.  Objective: Vitals:   09/20/16 2040 09/20/16 2109 09/21/16 0415 09/21/16 1444  BP:  (!) 100/48 (!) 96/51 101/64  Pulse: 88 79 69 82  Resp: 20 14 15 16   Temp:  99.6 F (37.6 C) 98.9 F (37.2 C) 98 F (36.7 C)  TempSrc:  Oral Oral   SpO2: 98% 100% 99% 100%  Weight:      Height:        Intake/Output Summary (Last 24 hours) at 09/21/16 1550 Last data filed at 09/21/16 1500  Gross per 24 hour  Intake            596.5 ml  Output                0 ml  Net            596.5 ml   Filed Weights   09/20/16 1115 09/20/16 1814  Weight: 49.9 kg (110 lb) 49.9 kg (110 lb)    Examination:  General exam: Alert, awake, oriented x 3 Respiratory system: Clear to auscultation. Respiratory effort  normal. Cardiovascular system:RRR. No murmurs, rubs, gallops. Gastrointestinal system: Abdomen is nondistended, soft and nontender. No organomegaly or masses felt. Normal bowel sounds heard. Central nervous system: Alert and oriented. No focal neurological deficits. Extremities: No C/C/E, +pedal pulses Skin: No rashes, lesions or ulcers Psychiatry: Judgement and insight appear normal. Mood & affect appropriate.     Data Reviewed: I have personally reviewed following labs and imaging studies  CBC:  Recent Labs Lab 09/20/16 1135 09/21/16 0728  WBC 16.0* 8.5  NEUTROABS 14.2* 4.1  HGB 14.9 11.9*  HCT 42.5 36.1  MCV 85.0 88.3  PLT 914* 295*   Basic Metabolic Panel:  Recent Labs Lab 09/20/16 1135 09/21/16 0728  NA 139 138  K 4.2 3.9  CL 97* 110  CO2 26 22  GLUCOSE 268* 74  BUN 21* 14  CREATININE 0.68 0.45  CALCIUM 11.1* 9.0   GFR: Estimated Creatinine Clearance: 86.2 mL/min (by C-G formula based on SCr of 0.45 mg/dL). Liver Function Tests:  Recent Labs Lab 09/21/16 0728  AST 27  ALT 15  ALKPHOS 76  BILITOT 1.2  PROT 6.8  ALBUMIN 3.5    Recent Labs Lab 09/20/16 1135  LIPASE 10*   No results for input(s): AMMONIA  in the last 168 hours. Coagulation Profile: No results for input(s): INR, PROTIME in the last 168 hours. Cardiac Enzymes: No results for input(s): CKTOTAL, CKMB, CKMBINDEX, TROPONINI in the last 168 hours. BNP (last 3 results) No results for input(s): PROBNP in the last 8760 hours. HbA1C: No results for input(s): HGBA1C in the last 72 hours. CBG:  Recent Labs Lab 09/21/16 0059 09/21/16 0417 09/21/16 0745 09/21/16 0816 09/21/16 1120  GLUCAP 100* 123* 56* 77 103*   Lipid Profile: No results for input(s): CHOL, HDL, LDLCALC, TRIG, CHOLHDL, LDLDIRECT in the last 72 hours. Thyroid Function Tests: No results for input(s): TSH, T4TOTAL, FREET4, T3FREE, THYROIDAB in the last 72 hours. Anemia Panel: No results for input(s): VITAMINB12,  FOLATE, FERRITIN, TIBC, IRON, RETICCTPCT in the last 72 hours. Urine analysis:    Component Value Date/Time   COLORURINE YELLOW 09/20/2016 1428   APPEARANCEUR HAZY (A) 09/20/2016 1428   LABSPEC 1.030 09/20/2016 1428   PHURINE 8.0 09/20/2016 1428   GLUCOSEU 150 (A) 09/20/2016 1428   HGBUR NEGATIVE 09/20/2016 1428   BILIRUBINUR NEGATIVE 09/20/2016 1428   KETONESUR 80 (A) 09/20/2016 1428   PROTEINUR 30 (A) 09/20/2016 1428   UROBILINOGEN 0.2 05/06/2015 0045   NITRITE NEGATIVE 09/20/2016 1428   LEUKOCYTESUR NEGATIVE 09/20/2016 1428   Sepsis Labs: @LABRCNTIP (procalcitonin:4,lacticidven:4)  )No results found for this or any previous visit (from the past 240 hour(s)).       Radiology Studies: Ct Abdomen Pelvis W Contrast  Result Date: 09/20/2016 CLINICAL DATA:  PMHx of DM on insulin, DKA, GERD, and IBS, who presents to the Emergency Department for an evaluation of hyperglycemia that began 2 days ago. Pt reports she noticed her blood sugar "spiked" yesterday with associated left-sided abdominal pain, nausea, vomiting. History of DKA. EXAM: CT ABDOMEN AND PELVIS WITH CONTRAST TECHNIQUE: Multidetector CT imaging of the abdomen and pelvis was performed using the standard protocol following bolus administration of intravenous contrast. CONTRAST:  19mL ISOVUE-300 IOPAMIDOL (ISOVUE-300) INJECTION 61%, 172mL ISOVUE-300 IOPAMIDOL (ISOVUE-300) INJECTION 61% COMPARISON:  08/20/2016, 08/19/2016 FINDINGS: Lower chest: No acute abnormality. Hepatobiliary: The liver is homogeneous. No focal liver lesion. Gallbladder is present and normal in CT appearance. Pancreas: Pancreatic duct stent has been placed since prior exams, decompressing the previously dilated pancreatic duct. No evidence for pancreatic mass. Spleen: Normal in size without focal abnormality. Left upper quadrant splenule is present in the region of the splenic hilum. Adrenals/Urinary Tract: Normal adrenal glands. Normal appearance of both kidneys.  No hydronephrosis or ureteral abnormality. Stomach/Bowel: The stomach and small bowel loops are normal in appearance. Colonic loops are normal in appearance. Appendix is not seen. Vascular/Lymphatic: No significant vascular findings are present. No enlarged abdominal or pelvic lymph nodes. Reproductive: The uterus is present.  No adnexal mass. Other: No abdominal wall hernia or abnormality. No abdominopelvic ascites. Musculoskeletal: No acute or significant osseous findings. IMPRESSION: 1. Interval placement of pancreatic duct stent, decompressing the previously dilated pancreatic duct. 2. No bowel obstruction or evidence for abscess. Electronically Signed   By: Nolon Nations M.D.   On: 09/20/2016 16:25        Scheduled Meds: . cholecalciferol  1,000 Units Oral Daily  . enoxaparin (LOVENOX) injection  40 mg Subcutaneous Q24H  . famotidine (PEPCID) IV  20 mg Intravenous Q12H  . ferrous sulfate  325 mg Oral Q breakfast  . gabapentin  100 mg Oral TID  . gabapentin  200 mg Oral QHS  . insulin aspart  0-15 Units Subcutaneous Q4H  . insulin glargine  15 Units Subcutaneous QHS  . lipase/protease/amylase  36,000 Units Oral TID WC   Continuous Infusions: . sodium chloride 100 mL/hr at 09/21/16 1330     LOS: 1 day    Time spent: 25 minutes. Greater than 50% of this time was spent in direct contact with the patient coordinating care.     Lelon Frohlich, MD [facility] Pager [phone]  If 7PM-7AM, please contact night-coverage [email or web address] Password [email or web address] 09/21/2016, 3:50 PM

## 2016-09-21 NOTE — Progress Notes (Signed)
Pt NPO with CBG of 40, hypoglycemia protocol initiated. 25 ml IV D50 given, CBG 15 minutes later 100. Will continue to monitor.

## 2016-09-22 ENCOUNTER — Encounter (HOSPITAL_COMMUNITY): Payer: Self-pay | Admitting: Gastroenterology

## 2016-09-22 DIAGNOSIS — K861 Other chronic pancreatitis: Secondary | ICD-10-CM

## 2016-09-22 DIAGNOSIS — K859 Acute pancreatitis without necrosis or infection, unspecified: Principal | ICD-10-CM

## 2016-09-22 LAB — COMPREHENSIVE METABOLIC PANEL
ALT: 15 U/L (ref 14–54)
ANION GAP: 9 (ref 5–15)
AST: 24 U/L (ref 15–41)
Albumin: 3.5 g/dL (ref 3.5–5.0)
Alkaline Phosphatase: 77 U/L (ref 38–126)
BILIRUBIN TOTAL: 0.5 mg/dL (ref 0.3–1.2)
BUN: 5 mg/dL — AB (ref 6–20)
CO2: 25 mmol/L (ref 22–32)
Calcium: 8.7 mg/dL — ABNORMAL LOW (ref 8.9–10.3)
Chloride: 105 mmol/L (ref 101–111)
Creatinine, Ser: 0.49 mg/dL (ref 0.44–1.00)
Glucose, Bld: 338 mg/dL — ABNORMAL HIGH (ref 65–99)
POTASSIUM: 4.1 mmol/L (ref 3.5–5.1)
Sodium: 139 mmol/L (ref 135–145)
TOTAL PROTEIN: 6.8 g/dL (ref 6.5–8.1)

## 2016-09-22 LAB — GLUCOSE, CAPILLARY
GLUCOSE-CAPILLARY: 137 mg/dL — AB (ref 65–99)
GLUCOSE-CAPILLARY: 157 mg/dL — AB (ref 65–99)
GLUCOSE-CAPILLARY: 358 mg/dL — AB (ref 65–99)
GLUCOSE-CAPILLARY: 53 mg/dL — AB (ref 65–99)
GLUCOSE-CAPILLARY: 69 mg/dL (ref 65–99)
GLUCOSE-CAPILLARY: 87 mg/dL (ref 65–99)
Glucose-Capillary: 102 mg/dL — ABNORMAL HIGH (ref 65–99)
Glucose-Capillary: 67 mg/dL (ref 65–99)
Glucose-Capillary: 59 mg/dL — ABNORMAL LOW (ref 65–99)
Glucose-Capillary: 209 mg/dL — ABNORMAL HIGH (ref 65–99)

## 2016-09-22 LAB — PATHOLOGIST SMEAR REVIEW

## 2016-09-22 LAB — HEMOGLOBIN A1C
Hgb A1c MFr Bld: 10.4 % — ABNORMAL HIGH (ref 4.8–5.6)
Mean Plasma Glucose: 252 mg/dL

## 2016-09-22 NOTE — Progress Notes (Signed)
PROGRESS NOTE    Linda Walls  KNL:[id number] DOB: 13-Jan-1991 DOA: 09/20/2016 PCP: [name], MD     Brief Narrative:  26 y/o young woman well known for hereditary pancreatitis and frequent admission for same; here with abdominal pain which she recognizes as a pancreatitis flare. Admission requested.   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 3 weeks and has been home for about 1 week. -Diet advanced to full liquids. -States pain and nausea are improved.  DM due to pancreatic insuffiencey -With labile CBGs. -Decrease lantus to 15 from 30..If CBGs remain elevated in am, will increase dose. -Monitor CBGs.   DVT prophylaxis: lovenox Code Status: full code Family Communication: mother at bedside Disposition Plan: home when ready  Consultants:   GI  Procedures:   None  Antimicrobials:  Anti-infectives    None       Subjective: Still with pain altho a little better. Wants some liquids.  Objective: Vitals:   09/21/16 1444 09/21/16 2033 09/22/16 0417 09/22/16 1542  BP: 101/64 105/64 (!) 98/59 111/73  Pulse: 82 82 64 68  Resp: 16 15 16 18   Temp: 98 F (36.7 C)  97.9 F (36.6 C) 98.3 F (36.8 C)  TempSrc:  Oral Oral Oral  SpO2: 100% 100% 99% 100%  Weight:      Height:        Intake/Output Summary (Last 24 hours) at 09/22/16 1830 Last data filed at 09/22/16 1400  Gross per 24 hour  Intake             2350 ml  Output                0 ml  Net             2350 ml   Filed Weights   09/20/16 1115 09/20/16 1814  Weight: 49.9 kg (110 lb) 49.9 kg (110 lb)    Examination:  General exam: Alert, awake, oriented x 3 Respiratory  system: Clear to auscultation. Respiratory effort normal. Cardiovascular system:RRR. No murmurs, rubs, gallops. Gastrointestinal system: Abdomen is nondistended, soft and nontender. No organomegaly or masses felt. Normal bowel sounds heard. Central nervous system: Alert and oriented. No focal neurological deficits. Extremities: No C/C/E, +pedal pulses Skin: No rashes, lesions or ulcers Psychiatry: Judgement and insight appear normal. Mood & affect appropriate.     Data Reviewed: I have personally reviewed following labs and imaging studies  CBC:  Recent Labs Lab 09/20/16 1135 09/21/16 0728  WBC 16.0* 8.5  NEUTROABS 14.2* 4.1  HGB 14.9 11.9*  HCT 42.5 36.1  MCV 85.0 88.3  PLT 914* 790*   Basic Metabolic Panel:  Recent Labs Lab 09/20/16 1135 09/21/16 0728 09/22/16 1423  NA 139 138 139  K 4.2 3.9 4.1  CL 97* 110 105  CO2 26 22 25   GLUCOSE 268* 74 338*  BUN 21* 14 5*  CREATININE 0.68 0.45 0.49  CALCIUM 11.1* 9.0 8.7*   GFR: Estimated Creatinine Clearance: 86.2 mL/min (by C-G formula based on SCr of 0.49 mg/dL). Liver Function Tests:  Recent Labs Lab 09/21/16 0728 09/22/16 1423  AST 27 24  ALT 15 15  ALKPHOS 76 77  BILITOT 1.2 0.5  PROT 6.8 6.8  ALBUMIN 3.5 3.5    Recent Labs Lab 09/20/16 1135  LIPASE 10*   No results for input(s): AMMONIA in the last 168 hours. Coagulation Profile: No results for input(s): INR, PROTIME in the last 168 hours. Cardiac Enzymes: No results for input(s): CKTOTAL, CKMB, CKMBINDEX, TROPONINI in the last 168 hours. BNP (last 3 results) No results for input(s): PROBNP in the last 8760 hours. HbA1C:  Recent Labs  09/20/16 1136  HGBA1C 10.4*   CBG:  Recent Labs Lab 09/22/16 0409 09/22/16 0756 09/22/16 0854 09/22/16 1144 09/22/16 1636  GLUCAP 157* 59* 137* 209* 358*   Lipid Profile: No results for input(s): CHOL, HDL, LDLCALC, TRIG, CHOLHDL, LDLDIRECT in the last 72 hours. Thyroid Function Tests: No results  for input(s): TSH, T4TOTAL, FREET4, T3FREE, THYROIDAB in the last 72 hours. Anemia Panel: No results for input(s): VITAMINB12, FOLATE, FERRITIN, TIBC, IRON, RETICCTPCT in the last 72 hours. Urine analysis:    Component Value Date/Time   COLORURINE YELLOW 09/20/2016 1428   APPEARANCEUR HAZY (A) 09/20/2016 1428   LABSPEC 1.030 09/20/2016 1428   PHURINE 8.0 09/20/2016 1428   GLUCOSEU 150 (A) 09/20/2016 1428   HGBUR NEGATIVE 09/20/2016 1428   BILIRUBINUR NEGATIVE 09/20/2016 1428   KETONESUR 80 (A) 09/20/2016 1428   PROTEINUR 30 (A) 09/20/2016 1428   UROBILINOGEN 0.2 05/06/2015 0045   NITRITE NEGATIVE 09/20/2016 1428   LEUKOCYTESUR NEGATIVE 09/20/2016 1428   Sepsis Labs: @LABRCNTIP (procalcitonin:4,lacticidven:4)  )No results found for this or any previous visit (from the past 240 hour(s)).       Radiology Studies: No results found.      Scheduled Meds: . cholecalciferol  1,000 Units Oral Daily  . enoxaparin (LOVENOX) injection  40 mg Subcutaneous Q24H  . famotidine (PEPCID) IV  20 mg Intravenous Q12H  . ferrous sulfate  325 mg Oral Q breakfast  . gabapentin  100 mg Oral TID  . gabapentin  200 mg Oral QHS  . insulin aspart  0-15 Units Subcutaneous Q4H  . insulin glargine  15 Units Subcutaneous QHS  . lipase/protease/amylase  36,000 Units Oral TID WC   Continuous Infusions: . sodium chloride 100 mL/hr at 09/22/16 1221     LOS: 2 days    Time spent: 25 minutes. Greater than 50% of this time was spent in direct contact with the patient coordinating care.     Lelon Frohlich, MD [facility] Pager [phone]  If 7PM-7AM, please contact night-coverage [email or web address] Password [email or web address] 09/22/2016, 6:30 PM

## 2016-09-22 NOTE — Consult Note (Signed)
Referring Provider: Dr. Jerilee Hoh  Primary Care Physician:  [name], MD Primary Gastroenterologist:  Dr.Mishra at [facility], last seen by Dr. [name] at [facility].   Date of Admission: 09/20/16 Date of Consultation: 09/22/16  Reason for Consultation:  Chronic Pancreatitis   HPI:  Linda Walls is a 26 y.o. 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 Jerene Pitch. 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 She will need repeat ERCP April 26 at [facility], which is already scheduled. While inpatient at [facility] 2/26-3/17, she had difficulty advancing diet post-ERCP and difficulty with pain control. They did not feel she had post-ERCP complications.  Hospitalization at [facility] was complicated by fever/leukocytosis, and she was found to have HAP. She presented again to [facility] on 3/24, noting recurrence of her typical flares with pancreatitis. Started clear liquids yesterday. Having issues with blood sugar dropping.   At home was was able to tolerate foods such as dry cereal, mashed potatoes. She had been doing well until she noted recurrent abdominal pain, nausea, and vomiting mid-abdomen. She was admitted for supportive care.  Pain is improved from admission but still the same as yesterday.She was started on clear liquids yesterday. For breakfast had broth and jello. Thinks she may be able to advance to full liquids. Some nausea no vomiting. She overall feels better since admission.   CT on admission with interval placement of pancreatic duct stent, decompressing the previously dilated pancreatic duct. No other concerning findings.   Past Medical History:  Diagnosis Date  . DKA (diabetic ketoacidoses) ([place])   . DM type 1 (diabetes mellitus, type 1) ([place])   . GERD (gastroesophageal reflux disease)    . Hereditary pancreatitis   . Hernia   . IBS (irritable bowel syndrome)   . Jaundice   . Migraines     Past Surgical History:  Procedure Laterality Date  . BILE DUCT STENT PLACEMENT  2006 at [place] 6 months after it was placed.  . EUS/ERCP  08/28/2016   At [facility] with placement of pancreatic duct stent  . HERNIA REPAIR     As infant.  Marland Kitchen PANCREATIC CYST DRAINAGE  at age 56 year    Prior to Admission medications   Medication Sig Start Date End Date Taking? Authorizing Provider  Cholecalciferol (VITAMIN D-1000 MAX ST) 1000 units tablet Take 1,000 Units by mouth daily.    Yes Historical Provider, MD  CREON 24000-76000 units CPEP Take two capsules with meals and one with snacks. 07/15/16  Yes Historical Provider, MD  ferrous sulfate (FERROUSUL) 325 (65 FE) MG tablet Take 325 mg by mouth daily with breakfast.    Yes Historical Provider, MD  gabapentin (NEURONTIN) 100 MG capsule Take 100mg  by mouth three times a day and 200mg  (2 caps) by mouth at bedtime 09/13/16  Yes Historical Provider, MD  hyoscyamine (LEVSIN, ANASPAZ) 0.125 MG tablet Take 0.125 mg by mouth every 4 (four) hours as needed for bladder spasms or cramping.  08/02/16  Yes Historical Provider, MD  insulin aspart (NOVOLOG) 100 UNIT/ML injection Inject 12 Units into the skin 3 (three) times daily before meals. Use sliding scale if sugar is running high.   Yes Historical Provider, MD  insulin degludec (TRESIBA FLEXTOUCH) 100 UNIT/ML SOPN FlexTouch Pen Inject 30 Units into the skin daily at 10  pm.   Yes Historical Provider, MD  omeprazole (PRILOSEC) 20 MG capsule Take 20 mg by mouth daily.  08/02/16  Yes Historical Provider, MD  oxyCODONE-acetaminophen (PERCOCET) 10-325 MG tablet Take 1 tablet by mouth every 4 (four) hours as needed.  09/03/16  Yes Historical Provider, MD  SUMAtriptan (IMITREX) 50 MG tablet Take 50 mg by mouth every 2 (two) hours as needed for migraine.  08/15/16  Yes Historical Provider, MD  vitamin C (ASCORBIC  ACID) 500 MG tablet Take 500 mg by mouth.   Yes Historical Provider, MD    Current Facility-Administered Medications  Medication Dose Route Frequency Provider Last Rate Last Dose  . 0.9 %  sodium chloride infusion   Intravenous Continuous Erline Hau, MD 100 mL/hr at 09/22/16 1221    . cholecalciferol (VITAMIN D) tablet 1,000 Units  1,000 Units Oral Daily Vianne Bulls, MD   1,000 Units at 09/22/16 1017  . enoxaparin (LOVENOX) injection 40 mg  40 mg Subcutaneous Q24H Vianne Bulls, MD   40 mg at 09/21/16 1708  . famotidine (PEPCID) IVPB 20 mg premix  20 mg Intravenous Q12H Vianne Bulls, MD   20 mg at 09/22/16 1017  . fentaNYL (SUBLIMAZE) injection 75 mcg  75 mcg Intravenous Q2H PRN Erline Hau, MD   75 mcg at 09/22/16 1221  . ferrous sulfate tablet 325 mg  325 mg Oral Q breakfast Vianne Bulls, MD   325 mg at 09/22/16 0807  . gabapentin (NEURONTIN) capsule 100 mg  100 mg Oral TID Vianne Bulls, MD   100 mg at 09/22/16 1017  . gabapentin (NEURONTIN) capsule 200 mg  200 mg Oral QHS Vianne Bulls, MD   200 mg at 09/21/16 2106  . hyoscyamine (LEVSIN, ANASPAZ) tablet 0.125 mg  0.125 mg Oral Q4H PRN Ilene Qua Opyd, MD      . insulin aspart (novoLOG) injection 0-15 Units  0-15 Units Subcutaneous Q4H Vianne Bulls, MD   2 Units at 09/21/16 2105  . insulin glargine (LANTUS) injection 15 Units  15 Units Subcutaneous QHS Erline Hau, MD   15 Units at 09/21/16 2105  . lipase/protease/amylase (CREON) capsule 36,000 Units  36,000 Units Oral TID WC Vianne Bulls, MD   36,000 Units at 09/22/16 1221  . oxyCODONE-acetaminophen (PERCOCET/ROXICET) 5-325 MG per tablet 2 tablet  2 tablet Oral Q4H PRN Vianne Bulls, MD      . promethazine (PHENERGAN) injection 12.5 mg  12.5 mg Intravenous Q6H PRN Vianne Bulls, MD   12.5 mg at 09/22/16 1017    Allergies as of 09/20/2016 - Review Complete 09/20/2016  Allergen Reaction Noted  . Shellfish allergy Nausea And Vomiting  04/29/2012  . Cefzil [cefprozil] Rash 10/15/2012  . Penicillins Rash 01/31/2015  . Sulfa antibiotics Rash 05/18/2011    Family History  Problem Relation Age of Onset  . Pancreatitis Father 30    hereditary  . Hypertension Father   . GER disease Father   . Hypothyroidism Mother   . Breast cancer Maternal Aunt   . Congenital heart disease Maternal Grandfather   . Prostate cancer Maternal Grandfather   . Alzheimer's disease Maternal Grandfather   . Colon cancer Neg Hx     Social History   Social History  . Marital status: Single    Spouse name: N/A  . Number of children: N/A  . Years of education: N/A   Occupational History  . Not on file.  Social History Main Topics  . Smoking status: Never Smoker  . Smokeless tobacco: Never Used  . Alcohol use No  . Drug use: No  . Sexual activity: Yes    Birth control/ protection: None   Other Topics Concern  . Not on file   Social History Narrative  . No narrative on file    Review of Systems: Gen: see HPI  CV: Denies chest pain, heart palpitations, syncope, edema  Resp: Denies shortness of breath with rest, cough, wheezing GI: see HPI  GU : Denies urinary burning, urinary frequency, urinary incontinence.  MS: Denies joint pain,swelling, cramping Derm: Denies rash, itching, dry skin Psych: Denies depression, anxiety,confusion, or memory loss Heme: Denies bruising, bleeding, and enlarged lymph nodes.  Physical Exam: Vital signs in last 24 hours: Temp:  [97.9 F (36.6 C)-98 F (36.7 C)] 97.9 F (36.6 C) (03/26 0417) Pulse Rate:  [64-82] 64 (03/26 0417) Resp:  [15-16] 16 (03/26 0417) BP: (98-105)/(59-64) 98/59 (03/26 0417) SpO2:  [99 %-100 %] 99 % (03/26 0417) Last BM Date: 09/20/16 General:   Alert,  Well-developed, well-nourished, pleasant and cooperative in NAD. Sitting up in bed knitting and crocheting  Head:  Normocephalic and atraumatic. Eyes:  Sclera clear, no icterus.   Conjunctiva pink. Ears:  Normal  auditory acuity. Nose:  No deformity, discharge,  or lesions. Mouth:  No deformity or lesions, dentition normal. Neck:  Supple; no masses or thyromegaly. Lungs:  Clear throughout to auscultation.   No wheezes, crackles, or rhonchi. No acute distress. Heart:  Regular rate and rhythm Abdomen:  Soft, mild TTP epigastric and RUQ and nondistended. No masses, hepatosplenomegaly or hernias noted. Normal bowel sounds, without guarding, and without rebound.   Rectal:  Deferred  Msk:  Symmetrical without gross deformities. Normal posture. Extremities:  Without edema. Neurologic:  Alert and  oriented x4 Psych:  Alert and cooperative. Normal mood and affect.  Intake/Output from previous day: 03/25 0701 - 03/26 0700 In: 200 [I.V.:150; IV Piggyback:50] Out: -  Intake/Output this shift: No intake/output data recorded.  Lab Results:  Recent Labs  09/20/16 1135 09/21/16 0728  WBC 16.0* 8.5  HGB 14.9 11.9*  HCT 42.5 36.1  PLT 914* 628*   BMET  Recent Labs  09/20/16 1135 09/21/16 0728  NA 139 138  K 4.2 3.9  CL 97* 110  CO2 26 22  GLUCOSE 268* 74  BUN 21* 14  CREATININE 0.68 0.45  CALCIUM 11.1* 9.0   LFT  Recent Labs  09/21/16 0728  PROT 6.8  ALBUMIN 3.5  AST 27  ALT 15  ALKPHOS 76  BILITOT 1.2    Lab Results  Component Value Date   LIPASE 10 (L) 09/20/2016    Studies/Results: Ct Abdomen Pelvis W Contrast  Result Date: 09/20/2016 CLINICAL DATA:  PMHx of DM on insulin, DKA, GERD, and IBS, who presents to the Emergency Department for an evaluation of hyperglycemia that began 2 days ago. Pt reports she noticed her blood sugar "spiked" yesterday with associated left-sided abdominal pain, nausea, vomiting. History of DKA. EXAM: CT ABDOMEN AND PELVIS WITH CONTRAST TECHNIQUE: Multidetector CT imaging of the abdomen and pelvis was performed using the standard protocol following bolus administration of intravenous contrast. CONTRAST:  54mL ISOVUE-300 IOPAMIDOL (ISOVUE-300)  INJECTION 61%, 137mL ISOVUE-300 IOPAMIDOL (ISOVUE-300) INJECTION 61% COMPARISON:  08/20/2016, 08/19/2016 FINDINGS: Lower chest: No acute abnormality. Hepatobiliary: The liver is homogeneous. No focal liver lesion. Gallbladder is present and normal in CT appearance. Pancreas: Pancreatic duct stent has been placed  since prior exams, decompressing the previously dilated pancreatic duct. No evidence for pancreatic mass. Spleen: Normal in size without focal abnormality. Left upper quadrant splenule is present in the region of the splenic hilum. Adrenals/Urinary Tract: Normal adrenal glands. Normal appearance of both kidneys. No hydronephrosis or ureteral abnormality. Stomach/Bowel: The stomach and small bowel loops are normal in appearance. Colonic loops are normal in appearance. Appendix is not seen. Vascular/Lymphatic: No significant vascular findings are present. No enlarged abdominal or pelvic lymph nodes. Reproductive: The uterus is present.  No adnexal mass. Other: No abdominal wall hernia or abnormality. No abdominopelvic ascites. Musculoskeletal: No acute or significant osseous findings. IMPRESSION: 1. Interval placement of pancreatic duct stent, decompressing the previously dilated pancreatic duct. 2. No bowel obstruction or evidence for abscess. Electronically Signed   By: Nolon Nations M.D.   On: 09/20/2016 16:25    Impression: 26 year old very pleasant female with history of hereditary pancreatitis, presenting with recurrent abdominal pain, N/V, that has improved with supportive measures since admission. Clinically, she has improved, tolerating clear liquids, and would like to try to advance slowly to full liquids. I do note that she is receiving Fentanyl 75 mcg approximately every 2-3 hours, so she is still requiring IV pain control. From review of notes at [facility], chronic pain specialist was consulted during prior hospitalization and recommended outpatient follow-up with pain clinic for chronic  pancreatitis.   Plan: Recheck CMP  Advance diet slowly as tolerated to full liquids.  Continue supportive measures Continue Creon dosing Outpatient follow-up at [facility] as previously planned   Annitta Needs, ANP-BC [facility]     LOS: 2 days    09/22/2016, 1:08 PM

## 2016-09-22 NOTE — Plan of Care (Signed)
Problem: Pain Managment: Goal: General experience of comfort will improve Outcome: Progressing Patient able to rate pain accordingly and verbalize the need for pain intervention

## 2016-09-22 NOTE — Progress Notes (Signed)
CBG data not transferring. Pt CBG 69 at 0025, 15 g carbohydrates provided and CBG 53 15 minutes later. 15 g carbohydrates provided, CBG 69 15 minutes later. 15 g carbohydrates provided, CBG 102 15 minutes later. Will continue to monitor.

## 2016-09-22 NOTE — Progress Notes (Signed)
CBG data not transferring, pt 0400 CBG 153.

## 2016-09-22 NOTE — Progress Notes (Signed)
Inpatient Diabetes Program Recommendations  AACE/ADA: New Consensus Statement on Inpatient Glycemic Control (2015)  Target Ranges:  Prepandial:   less than 140 mg/dL      Peak postprandial:   less than 180 mg/dL (1-2 hours)      Critically ill patients:  140 - 180 mg/dL   Lab Results  Component Value Date   GLUCAP 137 (H) 09/22/2016   HGBA1C 10.4 (H) 09/20/2016    Diabetes history: DM1 Outpatient Diabetes medications: Tresiba 30 units QHS, Novolog 12 units TID with meals Current orders for Inpatient glycemic control: Lantus 15 units QHS, Novolog 0-15 units Q4H   Inpatient Diabetes Program Recommendations:  Insulin - Basal: Note on Tresiba prior to admit., which can affect CBG's for up to 42 hours.  Agree with decreasing Lantus to 15 units QHS (similar to dose used during previous hospitalization).  Please also consider decreasing correction to sensitive correction scale Novolog 0-9 units TIDAC and 0-5 units QHS  Thank you,  Windy Carina, RN, MSN Diabetes Coordinator Inpatient Diabetes Program [phone] (Team Pager)

## 2016-09-23 DIAGNOSIS — K859 Acute pancreatitis without necrosis or infection, unspecified: Secondary | ICD-10-CM

## 2016-09-23 DIAGNOSIS — K861 Other chronic pancreatitis: Secondary | ICD-10-CM

## 2016-09-23 DIAGNOSIS — E0865 Diabetes mellitus due to underlying condition with hyperglycemia: Secondary | ICD-10-CM

## 2016-09-23 DIAGNOSIS — R112 Nausea with vomiting, unspecified: Secondary | ICD-10-CM

## 2016-09-23 LAB — GLUCOSE, CAPILLARY
GLUCOSE-CAPILLARY: 79 mg/dL (ref 65–99)
GLUCOSE-CAPILLARY: 57 mg/dL — AB (ref 65–99)
GLUCOSE-CAPILLARY: 74 mg/dL (ref 65–99)
GLUCOSE-CAPILLARY: 222 mg/dL — AB (ref 65–99)
GLUCOSE-CAPILLARY: 228 mg/dL — AB (ref 65–99)
Glucose-Capillary: 115 mg/dL — ABNORMAL HIGH (ref 65–99)
Glucose-Capillary: 198 mg/dL — ABNORMAL HIGH (ref 65–99)
Glucose-Capillary: 262 mg/dL — ABNORMAL HIGH (ref 65–99)

## 2016-09-23 MED ORDER — INSULIN ASPART 100 UNIT/ML ~~LOC~~ SOLN
0.0000 [IU] | Freq: Three times a day (TID) | SUBCUTANEOUS | Status: DC
  Administered 2016-09-23: 5 [IU] via SUBCUTANEOUS
  Administered 2016-09-23: 8 [IU] via SUBCUTANEOUS
  Administered 2016-09-24: 5 [IU] via SUBCUTANEOUS

## 2016-09-23 NOTE — Progress Notes (Signed)
PROGRESS NOTE    Linda Walls  OHY:[id number] DOB: 08/21/1990 DOA: 09/20/2016 PCP: [name], MD     Brief Narrative:  26 y/o young woman well known for hereditary pancreatitis and frequent admission for same; here with abdominal pain which she recognizes as a pancreatitis flare. Admission requested.   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 3 weeks and has been home for about 1 week. -Diet advanced to full liquids. -States she has had increased nausea today.  DM due to pancreatic insuffiencey -With labile CBGs. -Decrease lantus to 15 from 30. -Monitor CBGs. Goal for now is to try and avoid hypoglycemia.   DVT prophylaxis: lovenox Code Status: full code Family Communication: mother at bedside Disposition Plan: home when ready  Consultants:   GI  Procedures:   None  Antimicrobials:  Anti-infectives    None       Subjective: Still with pain altho a little better. Wants some liquids. Increased nausea today.  Objective: Vitals:   09/22/16 0417 09/22/16 1542 09/22/16 2200 09/23/16 0418  BP: (!) 98/59 111/73 102/72 (!) 96/54  Pulse: 64 68 74 63  Resp: 16 18 18 18   Temp: 97.9 F (36.6 C) 98.3 F (36.8 C) 98.7 F (37.1 C) 98.1 F (36.7 C)  TempSrc: Oral Oral Oral Oral  SpO2: 99% 100% 100% 100%  Weight:      Height:        Intake/Output Summary (Last 24 hours) at 09/23/16 1422 Last data filed at 09/23/16 0849  Gross per 24 hour  Intake             2190 ml  Output                0 ml  Net             2190 ml   Filed Weights   09/20/16 1115 09/20/16 1814  Weight: 49.9 kg (110 lb) 49.9 kg (110 lb)    Examination:  General  exam: Alert, awake, oriented x 3 Respiratory system: Clear to auscultation. Respiratory effort normal. Cardiovascular system:RRR. No murmurs, rubs, gallops. Gastrointestinal system: Abdomen is nondistended, soft and nontender. No organomegaly or masses felt. Normal bowel sounds heard. Central nervous system: Alert and oriented. No focal neurological deficits. Extremities: No C/C/E, +pedal pulses Skin: No rashes, lesions or ulcers Psychiatry: Judgement and insight appear normal. Mood & affect appropriate.     Data Reviewed: I have personally reviewed following labs and imaging studies  CBC:  Recent Labs Lab 09/20/16 1135 09/21/16 0728  WBC 16.0* 8.5  NEUTROABS 14.2* 4.1  HGB 14.9 11.9*  HCT 42.5 36.1  MCV 85.0 88.3  PLT 914* 948*   Basic Metabolic Panel:  Recent Labs Lab 09/20/16 1135 09/21/16 0728 09/22/16 1423  NA 139 138 139  K 4.2 3.9 4.1  CL 97* 110 105  CO2 26 22 25   GLUCOSE 268* 74 338*  BUN 21* 14 5*  CREATININE 0.68 0.45 0.49  CALCIUM 11.1* 9.0 8.7*   GFR: Estimated Creatinine Clearance: 86.2 mL/min (by C-G formula based on SCr of 0.49 mg/dL). Liver Function Tests:  Recent Labs Lab 09/21/16 0728 09/22/16 1423  AST 27 24  ALT 15  15  ALKPHOS 76 77  BILITOT 1.2 0.5  PROT 6.8 6.8  ALBUMIN 3.5 3.5    Recent Labs Lab 09/20/16 1135  LIPASE 10*   No results for input(s): AMMONIA in the last 168 hours. Coagulation Profile: No results for input(s): INR, PROTIME in the last 168 hours. Cardiac Enzymes: No results for input(s): CKTOTAL, CKMB, CKMBINDEX, TROPONINI in the last 168 hours. BNP (last 3 results) No results for input(s): PROBNP in the last 8760 hours. HbA1C: No results for input(s): HGBA1C in the last 72 hours. CBG:  Recent Labs Lab 09/23/16 0416 09/23/16 0755 09/23/16 0841 09/23/16 1138 09/23/16 1140  GLUCAP 79 57* 74 228* 222*   Lipid Profile: No results for input(s): CHOL, HDL, LDLCALC, TRIG, CHOLHDL, LDLDIRECT in the last 72  hours. Thyroid Function Tests: No results for input(s): TSH, T4TOTAL, FREET4, T3FREE, THYROIDAB in the last 72 hours. Anemia Panel: No results for input(s): VITAMINB12, FOLATE, FERRITIN, TIBC, IRON, RETICCTPCT in the last 72 hours. Urine analysis:    Component Value Date/Time   COLORURINE YELLOW 09/20/2016 1428   APPEARANCEUR HAZY (A) 09/20/2016 1428   LABSPEC 1.030 09/20/2016 1428   PHURINE 8.0 09/20/2016 1428   GLUCOSEU 150 (A) 09/20/2016 1428   HGBUR NEGATIVE 09/20/2016 1428   BILIRUBINUR NEGATIVE 09/20/2016 1428   KETONESUR 80 (A) 09/20/2016 1428   PROTEINUR 30 (A) 09/20/2016 1428   UROBILINOGEN 0.2 05/06/2015 0045   NITRITE NEGATIVE 09/20/2016 1428   LEUKOCYTESUR NEGATIVE 09/20/2016 1428   Sepsis Labs: @LABRCNTIP (procalcitonin:4,lacticidven:4)  )No results found for this or any previous visit (from the past 240 hour(s)).       Radiology Studies: No results found.      Scheduled Meds: . cholecalciferol  1,000 Units Oral Daily  . enoxaparin (LOVENOX) injection  40 mg Subcutaneous Q24H  . famotidine (PEPCID) IV  20 mg Intravenous Q12H  . ferrous sulfate  325 mg Oral Q breakfast  . gabapentin  100 mg Oral TID  . gabapentin  200 mg Oral QHS  . insulin aspart  0-15 Units Subcutaneous TID WC  . insulin glargine  15 Units Subcutaneous QHS  . lipase/protease/amylase  36,000 Units Oral TID WC   Continuous Infusions: . sodium chloride 100 mL/hr at 09/23/16 0754     LOS: 3 days    Time spent: 25 minutes. Greater than 50% of this time was spent in direct contact with the patient coordinating care.     Lelon Frohlich, MD [facility] Pager [phone]  If 7PM-7AM, please contact night-coverage [email or web address] Password [email or web address] 09/23/2016, 2:22 PM

## 2016-09-23 NOTE — Care Management Note (Signed)
Case Management Note  Patient Details  Name: Linda Walls MRN: [id number] Date of Birth: Sep 25, 1990  Subjective/Objective:                  Pt admitted with acute/chronic pancreatitis. Pt from home, lives with family, has PCP, transportation and no difficulty affording or managing medications. Pt has no HH or DME needs pta.  Action/Plan: Plan for return home with self care.   Expected Discharge Date:    09/24/2016              Expected Discharge Plan:  Home/Self Care  In-House Referral:  NA  Discharge planning Services  CM Consult  Post Acute Care Choice:  NA Choice offered to:  NA  Status of Service:  Completed, signed off  Sherald Barge, RN 09/23/2016, 9:32 AM

## 2016-09-23 NOTE — Progress Notes (Signed)
Inpatient Diabetes Program Recommendations  AACE/ADA: New Consensus Statement on Inpatient Glycemic Control (2015)  Target Ranges:  Prepandial:   less than 140 mg/dL      Peak postprandial:   less than 180 mg/dL (1-2 hours)      Critically ill patients:  140 - 180 mg/dL  Results for SHAKIARA, LUKIC (MRN [id number]) as of 09/23/2016 06:38  Ref. Range 09/22/2016 04:09 09/22/2016 07:56 09/22/2016 08:54 09/22/2016 11:44 09/22/2016 16:36 09/22/2016 21:14 09/23/2016 00:16 09/23/2016 04:16  Glucose-Capillary Latest Ref Range: 65 - 99 mg/dL 157 (H) 59 (L) 137 (H) 209 (H) 358 (H)  87 198 (H) 79    Review of Glycemic Control  Diabetes history: DM1 (insulin requiring and sensitive to insulin) Outpatient Diabetes medications: Tresiba 30 units QHS, Novolog 12 units TID with meals Current orders for Inpatient glycemic control: Lantus 15 units QHS, Novolog 0-15 units Q4H  Inpatient Diabetes Program Recommendations: Correction (SSI): Please decrease Novolog correction to sensitive scale (Novolog 0-9 units TID with meals) and change frequency to ACHS if patient is eating and tolerating diet. Insulin - Meal Coverage: When diet is advanced, please consider ordering Novolog 3 units TID with meals for meal coverage if patient eats at least 50% of meal.  Thanks, Barnie Alderman, RN, MSN, CDE Diabetes Coordinator Inpatient Diabetes Program [phone] (Team Pager from 8am to 5pm)

## 2016-09-23 NOTE — Progress Notes (Signed)
  Subjective:  Patient states she's feeling some better. She describes her pain to be moderate. It is located in epigastrium and left midabdomen. She has had some nausea but no vomiting. She is not experiencing more pain with full liquids.   Objective: Blood pressure 128/80, pulse 64, temperature 98.8 F (37.1 C), temperature source Oral, resp. rate 18, height 5\' 2"  (1.575 m), weight 110 lb (49.9 kg), last menstrual period 09/06/2016, SpO2 100 %. Patient is alert and in no acute distress. Abdomen is flat. Bowel sounds are normal. On palpation abdomen is soft with moderate midepigastric tenderness but no guarding. Mild tenderness noted in left mid abdomen. No organomegaly or masses.  No LE edema or clubbing noted.  Labs/studies Results:   Recent Labs  09/21/16 0728  WBC 8.5  HGB 11.9*  HCT 36.1  PLT 628*    BMET   Recent Labs  09/21/16 0728 09/22/16 1423  NA 138 139  K 3.9 4.1  CL 110 105  CO2 22 25  GLUCOSE 74 338*  BUN 14 5*  CREATININE 0.45 0.49  CALCIUM 9.0 8.7*    LFT   Recent Labs  09/21/16 0728 09/22/16 1423  PROT 6.8 6.8  ALBUMIN 3.5 3.5  AST 27 24  ALT 15 15  ALKPHOS 76 77  BILITOT 1.2 0.5    Abdominopelvic CT from 09/20/2016 reviewed. Long pancreatic stent in place. No changes of pancreatitis or fluid collection.   Assessment:  #1. Acute on chronic pain secondary to acute on chronic pancreatitis. Patient underwent pancreatic stenting on 08/28/2016 and it does not appear that has provided much relief. Patient is scheduled to be seen in [place] next months to consider pancreatic surgery.   Recommendations:  Will advance diet tomorrow unless pain severity increases.

## 2016-09-24 DIAGNOSIS — D473 Essential (hemorrhagic) thrombocythemia: Secondary | ICD-10-CM

## 2016-09-24 LAB — GLUCOSE, CAPILLARY
GLUCOSE-CAPILLARY: 108 mg/dL — AB (ref 65–99)
GLUCOSE-CAPILLARY: 385 mg/dL — AB (ref 65–99)
Glucose-Capillary: 205 mg/dL — ABNORMAL HIGH (ref 65–99)

## 2016-09-24 MED ORDER — INSULIN ASPART 100 UNIT/ML ~~LOC~~ SOLN
0.0000 [IU] | Freq: Every day | SUBCUTANEOUS | Status: DC
  Administered 2016-09-25: 5 [IU] via SUBCUTANEOUS
  Administered 2016-09-26: 3 [IU] via SUBCUTANEOUS
  Administered 2016-09-27: 4 [IU] via SUBCUTANEOUS

## 2016-09-24 MED ORDER — INSULIN ASPART 100 UNIT/ML ~~LOC~~ SOLN
0.0000 [IU] | Freq: Three times a day (TID) | SUBCUTANEOUS | Status: DC
  Administered 2016-09-24: 9 [IU] via SUBCUTANEOUS
  Administered 2016-09-25 – 2016-09-26 (×2): 5 [IU] via SUBCUTANEOUS
  Administered 2016-09-26: 4 [IU] via SUBCUTANEOUS
  Administered 2016-09-27: 3 [IU] via SUBCUTANEOUS
  Administered 2016-09-27: 2 [IU] via SUBCUTANEOUS
  Administered 2016-09-27 – 2016-09-28 (×2): 1 [IU] via SUBCUTANEOUS
  Administered 2016-09-28: 5 [IU] via SUBCUTANEOUS
  Administered 2016-09-29: 7 [IU] via SUBCUTANEOUS
  Administered 2016-09-29: 1 [IU] via SUBCUTANEOUS

## 2016-09-24 NOTE — Progress Notes (Signed)
PROGRESS NOTE  Linda Walls PJA:[id number] DOB: 02-14-1991 DOA: 09/20/2016 PCP: [name], MD  Brief History:  26 y.o. female with medical history significant for hereditary pancreatitis with pancreatic insufficiency and secondary diabetes mellitus, GERD, and migraines who presents to the emergency department with abdominal pain, nausea, and nonbloody vomiting. 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  The patient was recently transferred to [facility] on 08/25/16.  At [facility], the patient was admitted there from 08/25/2016 through 09/13/2016. During that admission, the patient had an ERCP and pancreatic stent placement on 08/28/2016. The patient had significant difficulty with advancement of her diet and required NG enteral feedings for a brief period of time. At the time of this admission, CT of the abdomen and pelvis was negative for any acute findings. The patient was placed on bowel rest initially, and her diet was gradually advanced.  Assessment/Plan: Acute on chronic pancreatitis -appreciate GI follow up. 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    Disposition Plan:   Home when able to tolerate soft diet Family Communication:   Mother updated at bedside--Total time spent 35 minutes.  Greater than 50% spent face to face counseling and coordinating care.   Consultants:  GI  Code Status:  FULL  DVT Prophylaxis:  [place] Lovenox   Procedures: As Listed in  Progress Note Above  Antibiotics: None    Subjective: Patient states that she had one episode of emesis last night. She is able to tolerate small amounts of full liquids. Although her pain is slightly worse in since advancement it has remained stable in the past 24 hours. She had a bowel movement yesterday. Denies any fevers, chills, chest pain, shortness breath.  Objective: Vitals:   09/23/16 0418 09/23/16 1450 09/23/16 2208 09/24/16 0700  BP: (!) 96/54 128/80 111/77 121/81  Pulse: 63 64 69 71  Resp: 18 18 18 18   Temp: 98.1 F (36.7 C) 98.8 F (37.1 C) 98.9 F (37.2 C) 98.6 F (37 C)  TempSrc: Oral Oral Oral Oral  SpO2: 100% 100% 100% 100%  Weight:      Height:        Intake/Output Summary (Last 24 hours) at 09/24/16 1213 Last data filed at 09/24/16 3419  Gross per 24 hour  Intake          1548.33 ml  Output                0 ml  Net          1548.33 ml   Weight change:  Exam:   General:  Pt is alert, follows commands appropriately, not in acute distress  HEENT: No icterus, No thrush, No neck mass, [place]/AT  Cardiovascular: RRR, S1/S2, no rubs, no gallops  Respiratory: CTA bilaterally, no wheezing, no crackles, no rhonchi  Abdomen: Soft/+BS, non tender, non distended, no guarding  Extremities: No edema, No lymphangitis, No petechiae, No rashes, no synovitis   Data Reviewed: I have personally reviewed following labs and imaging studies Basic Metabolic Panel:  Recent Labs Lab 09/20/16 1135 09/21/16 0728 09/22/16 1423  NA 139 138 139  K 4.2 3.9 4.1  CL 97* 110 105  CO2 26 22 25   GLUCOSE 268* 74 338*  BUN 21* 14 5*  CREATININE 0.68 0.45 0.49  CALCIUM 11.1* 9.0 8.7*   Liver Function Tests:  Recent Labs Lab 09/21/16 0728 09/22/16 1423  AST 27 24  ALT 15 15  ALKPHOS 76 77  BILITOT 1.2 0.5  PROT 6.8 6.8  ALBUMIN 3.5 3.5    Recent Labs Lab 09/20/16 1135  LIPASE 10*   No results for input(s): AMMONIA in the last 168 hours. Coagulation  Profile: No results for input(s): INR, PROTIME in the last 168 hours. CBC:  Recent Labs Lab 09/20/16 1135 09/21/16 0728  WBC 16.0* 8.5  NEUTROABS 14.2* 4.1  HGB 14.9 11.9*  HCT 42.5 36.1  MCV 85.0 88.3  PLT 914* 628*   Cardiac Enzymes: No results for input(s): CKTOTAL, CKMB, CKMBINDEX, TROPONINI in the last 168 hours. BNP: Invalid input(s): POCBNP CBG:  Recent Labs Lab 09/23/16 1140 09/23/16 1624 09/23/16 2207 09/24/16 0757 09/24/16 1111  GLUCAP 222* 262* 115* 205* 108*   HbA1C: No results for input(s): HGBA1C in the last 72 hours. Urine analysis:    Component Value Date/Time   COLORURINE YELLOW 09/20/2016 1428   APPEARANCEUR HAZY (A) 09/20/2016 1428   LABSPEC 1.030 09/20/2016 1428   PHURINE 8.0 09/20/2016 1428   GLUCOSEU 150 (A) 09/20/2016 1428   HGBUR NEGATIVE 09/20/2016 1428   BILIRUBINUR NEGATIVE 09/20/2016 1428   KETONESUR 80 (A) 09/20/2016 1428   PROTEINUR 30 (A) 09/20/2016 1428   UROBILINOGEN 0.2 05/06/2015 0045   NITRITE NEGATIVE 09/20/2016 1428   LEUKOCYTESUR NEGATIVE 09/20/2016 1428   Sepsis Labs: @LABRCNTIP (procalcitonin:4,lacticidven:4) )No results found for this or any previous visit (from the past 240 hour(s)).   Scheduled Meds: . cholecalciferol  1,000 Units Oral Daily  . enoxaparin (LOVENOX) injection  40 mg Subcutaneous Q24H  . famotidine (PEPCID) IV  20 mg Intravenous Q12H  . ferrous sulfate  325 mg Oral Q breakfast  . gabapentin  100 mg Oral TID  . gabapentin  200 mg Oral QHS  . insulin aspart  0-15 Units Subcutaneous TID WC  . insulin glargine  15 Units Subcutaneous QHS  . lipase/protease/amylase  36,000 Units Oral TID WC   Continuous Infusions: . sodium chloride 100 mL/hr at 09/24/16 0327    Procedures/Studies: Ct Abdomen Pelvis W Contrast  Result Date: 09/20/2016 CLINICAL DATA:  PMHx of DM on insulin, DKA, GERD, and IBS, who presents to the Emergency Department for an evaluation of hyperglycemia that began 2 days ago. Pt  reports she noticed her blood sugar "spiked" yesterday with associated left-sided abdominal pain, nausea, vomiting. History of DKA. EXAM: CT ABDOMEN AND PELVIS WITH CONTRAST TECHNIQUE: Multidetector CT imaging of the abdomen and pelvis was performed using the standard protocol following bolus administration of intravenous contrast. CONTRAST:  70mL ISOVUE-300 IOPAMIDOL (ISOVUE-300) INJECTION 61%, 164mL ISOVUE-300 IOPAMIDOL (ISOVUE-300) INJECTION 61% COMPARISON:  08/20/2016, 08/19/2016 FINDINGS: Lower chest: No acute abnormality. Hepatobiliary: The liver is homogeneous. No focal liver lesion. Gallbladder is present and normal in CT appearance. Pancreas: Pancreatic duct stent has been placed since prior exams, decompressing the previously dilated pancreatic duct. No evidence for pancreatic mass. Spleen: Normal in size without focal abnormality. Left upper quadrant splenule is present in the region of the splenic hilum. Adrenals/Urinary Tract: Normal adrenal glands. Normal appearance of both kidneys. No hydronephrosis or ureteral abnormality. Stomach/Bowel: The  stomach and small bowel loops are normal in appearance. Colonic loops are normal in appearance. Appendix is not seen. Vascular/Lymphatic: No significant vascular findings are present. No enlarged abdominal or pelvic lymph nodes. Reproductive: The uterus is present.  No adnexal mass. Other: No abdominal wall hernia or abnormality. No abdominopelvic ascites. Musculoskeletal: No acute or significant osseous findings. IMPRESSION: 1. Interval placement of pancreatic duct stent, decompressing the previously dilated pancreatic duct. 2. No bowel obstruction or evidence for abscess. Electronically Signed   By: Nolon Nations M.D.   On: 09/20/2016 16:25    Brandin Stetzer, DO  [facility] Pager [phone]  If 7PM-7AM, please contact night-coverage [email or web address] Password [email or web address] 09/24/2016, 12:13 PM   LOS: 4 days

## 2016-09-24 NOTE — Progress Notes (Signed)
Inpatient Diabetes Program Recommendations Results for DOROTHA, HIRSCHI (MRN [id number]) as of 09/24/2016 07:20  Ref. Range 09/23/2016 00:16 09/23/2016 04:16 09/23/2016 07:55 09/23/2016 08:41 09/23/2016 11:38 09/23/2016 11:40 09/23/2016 16:24 09/23/2016 22:07  Glucose-Capillary Latest Ref Range: 65 - 99 mg/dL 198 (H)  Novolog 3 units 79 57 (L) 74 228 (H) 222 (H)  Novolog 5 units 262 (H)  Novolog 8 units 115 (H)  Lantus 15 units    Review of Glycemic Control  Diabetes history: DM1 (insulin requiring and sensitive to insulin) Outpatient Diabetes medications: Tresiba 30 units QHS, Novolog 12 units TID with meals Current orders for Inpatient glycemic control: Lantus 15 units QHS, Novolog 0-15 units TID  Inpatient Diabetes Program Recommendations: Correction (SSI): Please decrease Novolog correction to sensitive scale (Novolog 0-9 units TID with meals) and add Novolog 0-5 units QHS.  Insulin - Meal Coverage: When diet is advanced, please consider ordering Novolog 3 units TID with meals for meal coverage if patient eats at least 50% of meal.  Thanks, Barnie Alderman, RN, MSN, CDE Diabetes Coordinator Inpatient Diabetes Program [phone] (Team Pager from 8am to 5pm)

## 2016-09-24 NOTE — Progress Notes (Signed)
  Subjective:  Patient states pain is about the same. She denies nausea or vomiting. She feels she is not ready for diet to be advanced. Pain is mainly in midepigastric region and left mid abdomen.   Objective: Blood pressure 116/82, pulse 91, temperature 98.4 F (36.9 C), resp. rate 18, height 5\' 2"  (1.575 m), weight 110 lb (49.9 kg), last menstrual period 09/06/2016, SpO2 98 %. Patient is alert and in no acute distress. Abdomen is flat. Bowel sounds are normal. She has mild to moderate midepigastric tenderness without guarding. She has mild tenderness at left mid abdomen. No organomegaly or masses. No LE edema or clubbing noted.  Labs/studies Results: BMET   Recent Labs  09/22/16 1423  NA 139  K 4.1  CL 105  CO2 25  GLUCOSE 338*  BUN 5*  CREATININE 0.49  CALCIUM 8.7*    LFT   Recent Labs  09/22/16 1423  PROT 6.8  ALBUMIN 3.5  AST 24  ALT 15  ALKPHOS 77  BILITOT 0.5     Assessment:  #1.Acute on chronic pancreatitis secondary to hereditary pancreatitis. 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 She is slowly improving. She is scheduled to undergo repeat ERCP in 4 weeks at [facility]. She is still with significant pain. CT on admission revealed pancreatic stent to be in good position and there was no evidence of fluid collection. #2. Diabetes mellitus secondary to pancreatic insufficiency Glucose levels ranging between 262 and 108 in the last 24 hours.  Recommendations:  Will advance diet to low-fat diet in a.m. Continue pancreatic enzyme supplements.

## 2016-09-25 LAB — CBC WITH DIFFERENTIAL/PLATELET
BASOS ABS: 0 10*3/uL (ref 0.0–0.1)
BASOS PCT: 0 %
Eosinophils Absolute: 0.5 10*3/uL (ref 0.0–0.7)
Eosinophils Relative: 7 %
HEMATOCRIT: 31 % — AB (ref 36.0–46.0)
HEMOGLOBIN: 10.2 g/dL — AB (ref 12.0–15.0)
LYMPHS PCT: 44 %
Lymphs Abs: 3 10*3/uL (ref 0.7–4.0)
MCH: 29.2 pg (ref 26.0–34.0)
MCHC: 32.9 g/dL (ref 30.0–36.0)
MCV: 88.8 fL (ref 78.0–100.0)
MONO ABS: 0.6 10*3/uL (ref 0.1–1.0)
MONOS PCT: 9 %
NEUTROS ABS: 2.7 10*3/uL (ref 1.7–7.7)
NEUTROS PCT: 40 %
Platelets: 402 10*3/uL — ABNORMAL HIGH (ref 150–400)
RBC: 3.49 MIL/uL — ABNORMAL LOW (ref 3.87–5.11)
RDW: 13.6 % (ref 11.5–15.5)
WBC: 6.7 10*3/uL (ref 4.0–10.5)

## 2016-09-25 LAB — GLUCOSE, CAPILLARY
GLUCOSE-CAPILLARY: 251 mg/dL — AB (ref 65–99)
GLUCOSE-CAPILLARY: 317 mg/dL — AB (ref 65–99)
GLUCOSE-CAPILLARY: 532 mg/dL — AB (ref 65–99)
GLUCOSE-CAPILLARY: 96 mg/dL (ref 65–99)
Glucose-Capillary: 160 mg/dL — ABNORMAL HIGH (ref 65–99)

## 2016-09-25 LAB — COMPREHENSIVE METABOLIC PANEL
ALBUMIN: 3 g/dL — AB (ref 3.5–5.0)
ALT: 12 U/L — ABNORMAL LOW (ref 14–54)
ANION GAP: 6 (ref 5–15)
AST: 19 U/L (ref 15–41)
Alkaline Phosphatase: 70 U/L (ref 38–126)
BILIRUBIN TOTAL: 0.5 mg/dL (ref 0.3–1.2)
BUN: <5 mg/dL — ABNORMAL LOW (ref 6–20)
CALCIUM: 8.3 mg/dL — AB (ref 8.9–10.3)
CO2: 29 mmol/L (ref 22–32)
Chloride: 101 mmol/L (ref 101–111)
Creatinine, Ser: 0.42 mg/dL — ABNORMAL LOW (ref 0.44–1.00)
GFR calc Af Amer: >60 mL/min (ref >60)
GFR calc non Af Amer: >60 mL/min (ref >60)
GLUCOSE: 353 mg/dL — AB (ref 65–99)
POTASSIUM: 3.8 mmol/L (ref 3.5–5.1)
SODIUM: 136 mmol/L (ref 135–145)
Total Protein: 5.9 g/dL — ABNORMAL LOW (ref 6.5–8.1)

## 2016-09-25 LAB — GLUCOSE, RANDOM: Glucose, Bld: 550 mg/dL (ref 65–99)

## 2016-09-25 NOTE — Progress Notes (Signed)
  Subjective:  Patient developed nausea and vomiting within 30 minutes of eating low-fat diet. She did not even need 50% anemia. Patient said pain got worse. She is back on clear liquids.   Objective: Blood pressure 115/78, pulse 80, temperature 98.6 F (37 C), resp. rate 18, height 5\' 2"  (1.575 m), weight 110 lb (49.9 kg), last menstrual period 09/06/2016, SpO2 100 %. Patient is alert and in no acute distress. Abdomen is symmetrical bowel sounds are normal. On palpation abdomen is soft. Mild to moderate midepigastric tenderness. She also has localized tenderness in left mid abdomen. No organomegaly or masses.  Labs/studies Results:   Recent Labs  09/25/16 0555  WBC 6.7  HGB 10.2*  HCT 31.0*  PLT 402*    BMET   Recent Labs  09/25/16 0555  NA 136  K 3.8  CL 101  CO2 29  GLUCOSE 353*  BUN <5*  CREATININE 0.42*  CALCIUM 8.3*    LFT   Recent Labs  09/25/16 0555  PROT 5.9*  ALBUMIN 3.0*  AST 19  ALT 12*  ALKPHOS 70  BILITOT 0.5     Assessment:  #1. Acute on chronic pancreatitis. Etiology is hereditary pancreatitis she was diagnosed just before she turned 5. She has had multiple complications and interventions along the way. She had pancreatic stenting 4 weeks ago. He felt better this period lasted only for 7 days. She did not tolerate low-fat diet. She is back on clear liquids. She is scheduled for follow-up ERCP in 4 weeks but it may have to be done earlier if she remains symptomatic. #2. Anemia. Anemia felt to be secondary to chronic disease. No evidence of GI bleed. #3. Diabetes mellitus secondary to pancreatic insufficiency. Patient is on long-acting insulin as well as sliding scale coverage with short acting insulin.  Recommendations:  Will change diet again in the next 24-48 hours.

## 2016-09-25 NOTE — Progress Notes (Signed)
PROGRESS NOTE  Linda Walls JQB:[id number] DOB: 03/15/1991 DOA: 09/20/2016 PCP: [name], MD  Brief History:  26 y.o.femalewith medical history significant for hereditary pancreatitis with pancreatic insufficiency and secondary diabetes mellitus, GERD, and migraines who presents to the emergency department with abdominal pain, nausea, and nonbloody vomiting. 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  The patient was recently transferred to [facility] on 08/25/16.  At [facility], the patient was admitted there from 08/25/2016 through 09/13/2016. During that admission, the patient had an ERCP and pancreatic stent placement on 08/28/2016. The patient had significant difficulty with advancement of her diet and required NG enteral feedings for a brief period of time. At the time of this admission, CT of the abdomen and pelvis was negative for any acute findings. The patient was placed on bowel rest initially, and her diet was gradually advanced.  Assessment/Plan: Acute on chronic pancreatitis -appreciate GI follow up. 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 -Continue Lantus 15 units -Continue NovoLog sliding scale--change to sensitive scale -HbA1c--pending  Thrombocytosis -Likely acute phase reactant -improving  SVT -pt had short run <5 seconds am 3/29 -remains in sinus since then -defer starting meds for now  Disposition Plan:   Home when able to tolerate soft diet Family Communication:   Mother updated at bedside   Consultants:  GI  Code Status:  FULL  DVT Prophylaxis:  [place] Lovenox   Procedures: As Listed in  Progress Note Above  Antibiotics: None    Subjective: Patient stated that she had an episode of nausea and vomiting after eating breakfast with eggs and grits and they can. She denies any fevers, chills, chest pain or breath, dysuria and hematuria patient a bowel movement yesterday. No hematochezia melena. Abdominal pain is a little worse since vomiting.  Objective: Vitals:   09/24/16 1441 09/24/16 2203 09/25/16 0637 09/25/16 1441  BP: 116/82 120/87 135/73 115/78  Pulse: 91 66 61 80  Resp: 18 18 18 18   Temp: 98.4 F (36.9 C) 98.4 F (36.9 C) 98 F (36.7 C) 98.6 F (37 C)  TempSrc:  Oral Oral   SpO2: 98% 100% 100% 100%  Weight:      Height:        Intake/Output Summary (Last 24 hours) at 09/25/16 1617 Last data filed at 09/25/16 0300  Gross per 24 hour  Intake          3408.33 ml  Output                0 ml  Net          3408.33 ml   Weight change:  Exam:   General:  Pt is alert, follows commands appropriately, not in acute distress  HEENT: No icterus, No thrush, No neck mass, [place]/AT  Cardiovascular: RRR, S1/S2, no rubs, no gallops  Respiratory: CTA bilaterally, no wheezing, no crackles, no rhonchi  Abdomen: Soft/+BS, Epigastric and RUQ tender, non distended, no guarding  Extremities: No edema, No lymphangitis, No petechiae, No rashes, no synovitis   Data Reviewed: I have personally reviewed following labs and imaging studies Basic Metabolic Panel:  Recent Labs Lab 09/20/16 1135 09/21/16 0728 09/22/16 1423 09/25/16 0555  NA  139 138 139 136  K 4.2 3.9 4.1 3.8  CL 97* 110 105 101  CO2 26 22 25 29   GLUCOSE 268* 74 338* 353*  BUN 21* 14 5* <5*  CREATININE 0.68 0.45 0.49 0.42*  CALCIUM 11.1* 9.0 8.7* 8.3*   Liver Function Tests:  Recent Labs Lab 09/21/16 0728 09/22/16 1423 09/25/16 0555  AST 27 24 19   ALT 15 15 12*  ALKPHOS 76 77 70  BILITOT 1.2 0.5 0.5  PROT 6.8 6.8 5.9*  ALBUMIN 3.5 3.5 3.0*    Recent Labs Lab 09/20/16 1135  LIPASE  10*   No results for input(s): AMMONIA in the last 168 hours. Coagulation Profile: No results for input(s): INR, PROTIME in the last 168 hours. CBC:  Recent Labs Lab 09/20/16 1135 09/21/16 0728 09/25/16 0555  WBC 16.0* 8.5 6.7  NEUTROABS 14.2* 4.1 2.7  HGB 14.9 11.9* 10.2*  HCT 42.5 36.1 31.0*  MCV 85.0 88.3 88.8  PLT 914* 628* 402*   Cardiac Enzymes: No results for input(s): CKTOTAL, CKMB, CKMBINDEX, TROPONINI in the last 168 hours. BNP: Invalid input(s): POCBNP CBG:  Recent Labs Lab 09/24/16 1111 09/24/16 1649 09/24/16 2200 09/25/16 0819 09/25/16 1118  GLUCAP 108* 385* 160* 251* 96   HbA1C: No results for input(s): HGBA1C in the last 72 hours. Urine analysis:    Component Value Date/Time   COLORURINE YELLOW 09/20/2016 1428   APPEARANCEUR HAZY (A) 09/20/2016 1428   LABSPEC 1.030 09/20/2016 1428   PHURINE 8.0 09/20/2016 1428   GLUCOSEU 150 (A) 09/20/2016 1428   HGBUR NEGATIVE 09/20/2016 1428   BILIRUBINUR NEGATIVE 09/20/2016 1428   KETONESUR 80 (A) 09/20/2016 1428   PROTEINUR 30 (A) 09/20/2016 1428   UROBILINOGEN 0.2 05/06/2015 0045   NITRITE NEGATIVE 09/20/2016 1428   LEUKOCYTESUR NEGATIVE 09/20/2016 1428   Sepsis Labs: @LABRCNTIP (procalcitonin:4,lacticidven:4) )No results found for this or any previous visit (from the past 240 hour(s)).   Scheduled Meds: . cholecalciferol  1,000 Units Oral Daily  . enoxaparin (LOVENOX) injection  40 mg Subcutaneous Q24H  . famotidine (PEPCID) IV  20 mg Intravenous Q12H  . ferrous sulfate  325 mg Oral Q breakfast  . gabapentin  100 mg Oral TID  . gabapentin  200 mg Oral QHS  . insulin aspart  0-5 Units Subcutaneous QHS  . insulin aspart  0-9 Units Subcutaneous TID WC  . insulin glargine  15 Units Subcutaneous QHS  . lipase/protease/amylase  36,000 Units Oral TID WC   Continuous Infusions: . sodium chloride 100 mL/hr at 09/25/16 1410    Procedures/Studies: Ct Abdomen Pelvis W Contrast  Result Date:  09/20/2016 CLINICAL DATA:  PMHx of DM on insulin, DKA, GERD, and IBS, who presents to the Emergency Department for an evaluation of hyperglycemia that began 2 days ago. Pt reports she noticed her blood sugar "spiked" yesterday with associated left-sided abdominal pain, nausea, vomiting. History of DKA. EXAM: CT ABDOMEN AND PELVIS WITH CONTRAST TECHNIQUE: Multidetector CT imaging of the abdomen and pelvis was performed using the standard protocol following bolus administration of intravenous contrast. CONTRAST:  90mL ISOVUE-300 IOPAMIDOL (ISOVUE-300) INJECTION 61%, 143mL ISOVUE-300 IOPAMIDOL (ISOVUE-300) INJECTION 61% COMPARISON:  08/20/2016, 08/19/2016 FINDINGS: Lower chest: No acute abnormality. Hepatobiliary: The liver is homogeneous. No focal liver lesion. Gallbladder is present and normal in CT appearance. Pancreas: Pancreatic duct stent has been placed since prior exams, decompressing the previously dilated pancreatic duct. No evidence for pancreatic mass. Spleen: Normal in size without focal abnormality. Left upper quadrant splenule is present in the  region of the splenic hilum. Adrenals/Urinary Tract: Normal adrenal glands. Normal appearance of both kidneys. No hydronephrosis or ureteral abnormality. Stomach/Bowel: The stomach and small bowel loops are normal in appearance. Colonic loops are normal in appearance. Appendix is not seen. Vascular/Lymphatic: No significant vascular findings are present. No enlarged abdominal or pelvic lymph nodes. Reproductive: The uterus is present.  No adnexal mass. Other: No abdominal wall hernia or abnormality. No abdominopelvic ascites. Musculoskeletal: No acute or significant osseous findings. IMPRESSION: 1. Interval placement of pancreatic duct stent, decompressing the previously dilated pancreatic duct. 2. No bowel obstruction or evidence for abscess. Electronically Signed   By: Nolon Nations M.D.   On: 09/20/2016 16:25    Tia Gelb, DO  [facility] Pager  [phone]  If 7PM-7AM, please contact night-coverage [email or web address] Password [email or web address] 09/25/2016, 4:17 PM   LOS: 5 days

## 2016-09-25 NOTE — Care Management Note (Signed)
Case Management Note  Patient Details  Name: Linda Walls MRN: [id number] Date of Birth: 03-13-1991  If discussed at Long Length of Stay Meetings, dates discussed:  09/25/2016   Sherald Barge, RN 09/25/2016, 10:37 AM

## 2016-09-25 NOTE — Progress Notes (Signed)
Inpatient Diabetes Program Recommendations  AACE/ADA: New Consensus Statement on Inpatient Glycemic Control (2015)  Target Ranges:  Prepandial:   less than 140 mg/dL      Peak postprandial:   less than 180 mg/dL (1-2 hours)      Critically ill patients:  140 - 180 mg/dL   Results for Linda Walls, Linda Walls (MRN [id number]) as of 09/25/2016 07:29  Ref. Range 09/24/2016 07:57 09/24/2016 11:11 09/24/2016 16:49 09/24/2016 22:00  Glucose-Capillary Latest Ref Range: 65 - 99 mg/dL 205 (H) 108 (H) 385 (H) 160 (H)   Review of Glycemic Control  Diabetes history: DM1 (makes no insulin and sensitive to insulin) Outpatient Diabetes medications:Tresiba 30 units QHS, Novolog 12 units TID with meals Current orders for Inpatient glycemic control: Lantus 15 units QHS, Novolog 0-9 units TID, Novolog 0-5 units QHS  Inpatient Diabetes Program Recommendations: Insulin - Meal Coverage: Patient has DM1 and makes no insulin to cover carbohydrates consumed (in liquids and foods) and will require insulin for carbohydrates. Since patient's intake is still variable, please consider ordering Novolog 0-7 units TID with meals in which 1 unit covers 10 grams of carbs.  Diet: Patient was ordered Heart Healthy diet so Carb Modified was added to diet order this morning.  Thanks, Barnie Alderman, RN, MSN, CDE Diabetes Coordinator Inpatient Diabetes Program [phone] (Team Pager from 8am to 5pm)

## 2016-09-25 NOTE — Progress Notes (Signed)
Pt CBG 532, ordered stat blood glucose, resulted at 550. MD made aware. Ordered to give regular dose 15 U lantus and 5 U novolog. Will continue to monitor.

## 2016-09-26 ENCOUNTER — Ambulatory Visit: Payer: BC Managed Care – PPO | Admitting: Family Medicine

## 2016-09-26 LAB — GLUCOSE, CAPILLARY
GLUCOSE-CAPILLARY: 148 mg/dL — AB (ref 65–99)
GLUCOSE-CAPILLARY: 138 mg/dL — AB (ref 65–99)
GLUCOSE-CAPILLARY: 389 mg/dL — AB (ref 65–99)
Glucose-Capillary: 258 mg/dL — ABNORMAL HIGH (ref 65–99)
Glucose-Capillary: 264 mg/dL — ABNORMAL HIGH (ref 65–99)

## 2016-09-26 LAB — HEMOGLOBIN A1C
Hgb A1c MFr Bld: 9.3 % — ABNORMAL HIGH (ref 4.8–5.6)
Mean Plasma Glucose: 220 mg/dL

## 2016-09-26 NOTE — Progress Notes (Addendum)
Inpatient Diabetes Program Recommendations  AACE/ADA: New Consensus Statement on Inpatient Glycemic Control (2015)  Target Ranges:  Prepandial:   less than 140 mg/dL      Peak postprandial:   less than 180 mg/dL (1-2 hours)      Critically ill patients:  140 - 180 mg/dL   Results for Linda Walls, Linda Walls (MRN [id number]) as of 09/26/2016 12:32  Ref. Range 09/25/2016 08:19 09/25/2016 11:18 09/25/2016 20:33 09/25/2016 23:38  Glucose-Capillary Latest Ref Range: 65 - 99 mg/dL 251 (H) 96 532 (HH) 317 (H)   Results for Linda Walls, Linda Walls (MRN [id number]) as of 09/26/2016 12:32  Ref. Range 09/26/2016 08:01 09/26/2016 12:20  Glucose-Capillary Latest Ref Range: 65 - 99 mg/dL 138 (H) 389 (H)    Review of Glycemic Control  Diabetes history:DM1 (insulin requiring and sensitive to insulin)  Outpatient Diabetes medications: Tresiba 30 units QHS, Novolog 12 units TID with meals  Current orders for Inpatient glycemic control: Lantus 15 units QHS           Novolog 0-9 units TID AC + HS    Inpatient Diabetes Program Recommendations:  Insulin - Meal Coverage:When diet is advanced, please consider ordering Novolog 3 units TID with meals for meal coverage (hold if patient eats <50% of meal)    --Will follow patient during hospitalization--  Wyn Quaker RN, MSN, CDE Diabetes Coordinator Inpatient Glycemic Control Team Team Pager: [phone] (8a-5p)

## 2016-09-26 NOTE — Progress Notes (Addendum)
PROGRESS NOTE  Linda Walls VPX:[id number] DOB: 08-29-1990 DOA: 09/20/2016 PCP: [name], MD  Brief History: 26 y.o.femalewith medical history significant for hereditary pancreatitis with pancreatic insufficiency and secondary diabetes mellitus, GERD, and migraines who presents to the emergency department with abdominal pain, nausea, and nonbloody vomiting. 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 The patient was recently transferred to [facility] on 08/25/16. At [facility], the patient was admitted there from 08/25/2016 through 09/13/2016. During that admission, the patient had an ERCP and pancreatic stent placement on 08/28/2016. The patient had significant difficulty with advancement of her diet and required NG enteral feedings for a brief period of time. At the time of this admission, CT of the abdomen and pelvis was negative for any acute findings. The patient was placed on bowel rest initially, and her diet was gradually advanced.  Assessment/Plan: Acute on chronic pancreatitis -appreciate GI follow up. -09/25/16--vomiting after eating soft diet for breakfast, then increase abd pain -down graded to clear liquids 3/29 -continue clears--based upon CBGs, pt may be eating more that she is endorsing -advance to full liquids tomorrow if stable -platelets trending down, suggesting clinical improvement -continue IVF -Continue Creon -am CMP  Diabetes mellitus secondary to pancreatic insufficiency -Continue Lantus 15 units -Continue NovoLog sliding scale--change to sensitive scale -3/29--HbA1c--9.3  Thrombocytosis -Likely acute phase reactant -improving-->suggests clinical improvement?  SVT -pt had short run <5 seconds am 3/29 -remains in sinus since then -defer starting meds for now  Disposition  Plan: Home when able to tolerate soft diet Family Communication: Mother updated at bedside 3/29   Consultants: GI  Code Status: FULL  DVT Prophylaxis: [place] Lovenox   Procedures: As Listed in Progress Note Above  Antibiotics: None     Subjective: Patient states that abdominal pain is about the same with clear liquids. It is controlled with opioids IV. Denies any chest pain, shortness breath, vomiting, diarrhea. Had a bowel movement today.  Objective: Vitals:   09/25/16 0637 09/25/16 1441 09/25/16 2029 09/26/16 0625  BP: 135/73 115/78 132/88 121/82  Pulse: 61 80 63 95  Resp: 18 18 20 18   Temp: 98 F (36.7 C) 98.6 F (37 C) 98.8 F (37.1 C) 98.4 F (36.9 C)  TempSrc: Oral  Oral Oral  SpO2: 100% 100% 98% 100%  Weight:      Height:        Intake/Output Summary (Last 24 hours) at 09/26/16 1659 Last data filed at 09/26/16 0843  Gross per 24 hour  Intake              840 ml  Output              600 ml  Net              240 ml   Weight change:  Exam:   General:  Pt is alert, follows commands appropriately, not in acute distress  HEENT: No icterus, No thrush, No neck mass, [place]/AT  Cardiovascular: RRR, S1/S2, no rubs, no gallops  Respiratory: CTA bilaterally, no wheezing, no crackles, no rhonchi  Abdomen: Soft/+BS,epigastric tender, non distended, no guarding  Extremities: No edema, No lymphangitis, No petechiae, No rashes, no synovitis   Data Reviewed: I have personally reviewed following labs and imaging studies Basic Metabolic Panel:  Recent Labs Lab 09/20/16 1135 09/21/16 0728 09/22/16 1423  09/25/16 0555 09/25/16 2037  NA 139 138 139 136  --   K 4.2 3.9 4.1 3.8  --   CL 97* 110 105 101  --   CO2 26 22 25 29   --   GLUCOSE 268* 74 338* 353* 550*  BUN 21* 14 5* <5*  --   CREATININE 0.68 0.45 0.49 0.42*  --   CALCIUM 11.1* 9.0 8.7* 8.3*  --    Liver Function Tests:  Recent Labs Lab 09/21/16 0728 09/22/16 1423 09/25/16 0555    AST 27 24 19   ALT 15 15 12*  ALKPHOS 76 77 70  BILITOT 1.2 0.5 0.5  PROT 6.8 6.8 5.9*  ALBUMIN 3.5 3.5 3.0*    Recent Labs Lab 09/20/16 1135  LIPASE 10*   No results for input(s): AMMONIA in the last 168 hours. Coagulation Profile: No results for input(s): INR, PROTIME in the last 168 hours. CBC:  Recent Labs Lab 09/20/16 1135 09/21/16 0728 09/25/16 0555  WBC 16.0* 8.5 6.7  NEUTROABS 14.2* 4.1 2.7  HGB 14.9 11.9* 10.2*  HCT 42.5 36.1 31.0*  MCV 85.0 88.3 88.8  PLT 914* 628* 402*   Cardiac Enzymes: No results for input(s): CKTOTAL, CKMB, CKMBINDEX, TROPONINI in the last 168 hours. BNP: Invalid input(s): POCBNP CBG:  Recent Labs Lab 09/25/16 2338 09/26/16 0208 09/26/16 0801 09/26/16 1220 09/26/16 1603  GLUCAP 317* 148* 138* 389* 258*   HbA1C:  Recent Labs  09/25/16 0555  HGBA1C 9.3*   Urine analysis:    Component Value Date/Time   COLORURINE YELLOW 09/20/2016 1428   APPEARANCEUR HAZY (A) 09/20/2016 1428   LABSPEC 1.030 09/20/2016 1428   PHURINE 8.0 09/20/2016 1428   GLUCOSEU 150 (A) 09/20/2016 1428   HGBUR NEGATIVE 09/20/2016 1428   BILIRUBINUR NEGATIVE 09/20/2016 1428   KETONESUR 80 (A) 09/20/2016 1428   PROTEINUR 30 (A) 09/20/2016 1428   UROBILINOGEN 0.2 05/06/2015 0045   NITRITE NEGATIVE 09/20/2016 1428   LEUKOCYTESUR NEGATIVE 09/20/2016 1428   Sepsis Labs: @LABRCNTIP (procalcitonin:4,lacticidven:4) )No results found for this or any previous visit (from the past 240 hour(s)).   Scheduled Meds: . cholecalciferol  1,000 Units Oral Daily  . enoxaparin (LOVENOX) injection  40 mg Subcutaneous Q24H  . famotidine (PEPCID) IV  20 mg Intravenous Q12H  . ferrous sulfate  325 mg Oral Q breakfast  . gabapentin  100 mg Oral TID  . gabapentin  200 mg Oral QHS  . insulin aspart  0-5 Units Subcutaneous QHS  . insulin aspart  0-9 Units Subcutaneous TID WC  . insulin glargine  15 Units Subcutaneous QHS  . lipase/protease/amylase  36,000 Units Oral TID  WC   Continuous Infusions: . sodium chloride 100 mL/hr at 09/26/16 1115    Procedures/Studies: Ct Abdomen Pelvis W Contrast  Result Date: 09/20/2016 CLINICAL DATA:  PMHx of DM on insulin, DKA, GERD, and IBS, who presents to the Emergency Department for an evaluation of hyperglycemia that began 2 days ago. Pt reports she noticed her blood sugar "spiked" yesterday with associated left-sided abdominal pain, nausea, vomiting. History of DKA. EXAM: CT ABDOMEN AND PELVIS WITH CONTRAST TECHNIQUE: Multidetector CT imaging of the abdomen and pelvis was performed using the standard protocol following bolus administration of intravenous contrast. CONTRAST:  13mL ISOVUE-300 IOPAMIDOL (ISOVUE-300) INJECTION 61%, 177mL ISOVUE-300 IOPAMIDOL (ISOVUE-300) INJECTION 61% COMPARISON:  08/20/2016, 08/19/2016 FINDINGS: Lower chest: No acute abnormality. Hepatobiliary: The liver is homogeneous. No focal liver lesion. Gallbladder is present and normal in CT appearance. Pancreas: Pancreatic duct stent has been  placed since prior exams, decompressing the previously dilated pancreatic duct. No evidence for pancreatic mass. Spleen: Normal in size without focal abnormality. Left upper quadrant splenule is present in the region of the splenic hilum. Adrenals/Urinary Tract: Normal adrenal glands. Normal appearance of both kidneys. No hydronephrosis or ureteral abnormality. Stomach/Bowel: The stomach and small bowel loops are normal in appearance. Colonic loops are normal in appearance. Appendix is not seen. Vascular/Lymphatic: No significant vascular findings are present. No enlarged abdominal or pelvic lymph nodes. Reproductive: The uterus is present.  No adnexal mass. Other: No abdominal wall hernia or abnormality. No abdominopelvic ascites. Musculoskeletal: No acute or significant osseous findings. IMPRESSION: 1. Interval placement of pancreatic duct stent, decompressing the previously dilated pancreatic duct. 2. No bowel obstruction  or evidence for abscess. Electronically Signed   By: Nolon Nations M.D.   On: 09/20/2016 16:25    Maddilyn Campus, DO  [facility] Pager [phone]  If 7PM-7AM, please contact night-coverage [email or web address] Password [email or web address] 09/26/2016, 4:59 PM   LOS: 6 days

## 2016-09-26 NOTE — Progress Notes (Signed)
  Subjective:  Patient reports pain is moderate and not worse with clear liquids. She has had some nausea but no vomiting. She states she has been up all night because of issues with high blood glucose level and having to get frequent insulin dose. She denies melena or rectal bleeding hematuria or vaginal bleeding. She states she did have nasal enteral feeding tube while she was at [facility] earlier this month.   Objective: Blood pressure 121/82, pulse 95, temperature 98.4 F (36.9 C), temperature source Oral, resp. rate 18, height 5\' 2"  (1.575 m), weight 110 lb (49.9 kg), last menstrual period 09/06/2016, SpO2 100 %. Patient is alert and in no acute distress. Cardiac exam with regular rhythm normal S1 and S2. Lungs clear to auscultation. Abdomen is flat. Bowel sounds are normal. She has mild tenderness at epigastrium and left mid abdomen without guarding. No organomegaly or masses.  Labs/studies Results:   Recent Labs  09/25/16 0555  WBC 6.7  HGB 10.2*  HCT 31.0*  PLT 402*    BMET   Recent Labs  09/25/16 0555 09/25/16 2037  NA 136  --   K 3.8  --   CL 101  --   CO2 29  --   GLUCOSE 353* 550*  BUN <5*  --   CREATININE 0.42*  --   CALCIUM 8.3*  --     LFT   Recent Labs  09/25/16 0555  PROT 5.9*  ALBUMIN 3.0*  AST 19  ALT 12*  ALKPHOS 70  BILITOT 0.5     Assessment:  #1. Acute on chronic pancreatitis. Symptomatically patient is feeling better while on clear liquids but she is requiring pain medication almost every 2 hours. No stigmata of worsening pancreatitis such as fever. Will advance her diet later today and if she fails will contact her GI physicians at [facility]. #2. Diabetes mellitus secondary to pancreatic insufficiency.  Glucose levels between 532 and 138 in last 24 hours. & Darrick Grinder has been difficult because of unpredictable oral intake. #3. Thrombocytosis noted on admission. Platelet count normal yesterday. Thrombocytosis would appear to be secondary to  pancreatitis i.e. inflammatory marker.  Recommendations:  Advance diet to diabetic full liquids later today.

## 2016-09-26 NOTE — Progress Notes (Signed)
CBG 317 on recheck, MD made aware, will continue to monitor.

## 2016-09-27 DIAGNOSIS — R739 Hyperglycemia, unspecified: Secondary | ICD-10-CM

## 2016-09-27 LAB — GLUCOSE, CAPILLARY
GLUCOSE-CAPILLARY: 143 mg/dL — AB (ref 65–99)
GLUCOSE-CAPILLARY: 188 mg/dL — AB (ref 65–99)
GLUCOSE-CAPILLARY: 320 mg/dL — AB (ref 65–99)
Glucose-Capillary: 222 mg/dL — ABNORMAL HIGH (ref 65–99)
Glucose-Capillary: 136 mg/dL — ABNORMAL HIGH (ref 65–99)

## 2016-09-27 LAB — COMPREHENSIVE METABOLIC PANEL
ALK PHOS: 74 U/L (ref 38–126)
ALT: 14 U/L (ref 14–54)
AST: 22 U/L (ref 15–41)
Albumin: 3.1 g/dL — ABNORMAL LOW (ref 3.5–5.0)
Anion gap: 7 (ref 5–15)
BILIRUBIN TOTAL: 0.2 mg/dL — AB (ref 0.3–1.2)
BUN: <5 mg/dL — ABNORMAL LOW (ref 6–20)
CALCIUM: 8.6 mg/dL — AB (ref 8.9–10.3)
CO2: 26 mmol/L (ref 22–32)
CREATININE: 0.39 mg/dL — AB (ref 0.44–1.00)
Chloride: 104 mmol/L (ref 101–111)
GFR calc non Af Amer: >60 mL/min (ref >60)
GLUCOSE: 240 mg/dL — AB (ref 65–99)
Potassium: 3.7 mmol/L (ref 3.5–5.1)
SODIUM: 137 mmol/L (ref 135–145)
TOTAL PROTEIN: 6.1 g/dL — AB (ref 6.5–8.1)

## 2016-09-27 LAB — CBC
HCT: 30.6 % — ABNORMAL LOW (ref 36.0–46.0)
Hemoglobin: 10.1 g/dL — ABNORMAL LOW (ref 12.0–15.0)
MCH: 29.1 pg (ref 26.0–34.0)
MCHC: 33 g/dL (ref 30.0–36.0)
MCV: 88.2 fL (ref 78.0–100.0)
PLATELETS: 343 10*3/uL (ref 150–400)
RBC: 3.47 MIL/uL — AB (ref 3.87–5.11)
RDW: 13.9 % (ref 11.5–15.5)
WBC: 7.9 10*3/uL (ref 4.0–10.5)

## 2016-09-27 MED ORDER — FAMOTIDINE 20 MG PO TABS
20.0000 mg | ORAL_TABLET | Freq: Two times a day (BID) | ORAL | Status: DC
  Administered 2016-09-27 – 2016-10-03 (×12): 20 mg via ORAL
  Filled 2016-09-27 (×12): qty 1

## 2016-09-27 NOTE — Progress Notes (Signed)
PROGRESS NOTE  Linda Walls QQP:[id number] DOB: January 21, 1991 DOA: 09/20/2016 PCP: [name], MD  Brief History:  26 y.o.femalewith medical history significant for hereditary pancreatitis with pancreatic insufficiency and secondary diabetes mellitus, GERD, and migraines who presents to the emergency department with abdominal pain, nausea, and nonbloody vomiting. 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 The patient was recently transferred to [facility] on 08/25/16. At [facility], the patient was admitted there from 08/25/2016 through 09/13/2016. During that admission, the patient had an ERCP and pancreatic stent placement on 08/28/2016. The patient had significant difficulty with advancement of her diet and required NG enteral feedings for a brief period of time. At the time of this admission, CT of the abdomen and pelvis was negative for any acute findings. The patient was placed on bowel rest initially, and her diet was gradually advanced.  Assessment/Plan: Acute on chronic pancreatitis -appreciate GI follow up. Discussed with Dr. [name] -09/25/16--vomiting after eating soft diet for breakfast, then increase abd pain -down graded to clear liquids 3/29 -advanced to full liquids 09/27/16--based upon CBGs (elevated), pt may be eating more that she is endorsing -platelets trending down, suggesting clinical improvement -no fever or tachycardia, LFTs normal -continue IVF -Continue Creon  Diabetes mellitus secondary to pancreatic insufficiency -Continue Lantus 15 units -Continue NovoLog sliding scale--change to sensitive scale -3/29--HbA1c--9.3  Thrombocytosis -Likely acute phase reactant -improving-->suggests clinical improvement?  SVT -pt had short run <5 seconds am 3/29 -remains in sinus since then -defer  starting meds for now  Disposition Plan: Home when able to tolerate soft diet Family Communication: Mother updated at bedside 3/31   Consultants: GI  Code Status: FULL  DVT Prophylaxis: [place] Lovenox   Procedures: As Listed in Progress Note Above  Antibiotics: None   Subjective: Patient says overall abdominal pain is improving since downgraded to a clear liquid diet. No further vomiting. Nausea has improved. Denies any fevers, chills, chest pain, dysuria, hematuria, headache, neck pain.  Objective: Vitals:   09/25/16 2029 09/26/16 0625 09/26/16 2252 09/27/16 0654  BP: 132/88 121/82 117/83 119/77  Pulse: 63 95 78 62  Resp: 20 18 20 14   Temp: 98.8 F (37.1 C) 98.4 F (36.9 C) 98.3 F (36.8 C) 97.5 F (36.4 C)  TempSrc: Oral Oral Oral Oral  SpO2: 98% 100% 100% 98%  Weight:      Height:        Intake/Output Summary (Last 24 hours) at 09/27/16 1358 Last data filed at 09/27/16 1022  Gross per 24 hour  Intake              480 ml  Output                0 ml  Net              480 ml   Weight change:  Exam:   General:  Pt is alert, follows commands appropriately, not in acute distress  HEENT: No icterus, No thrush, No neck mass, [place]/AT  Cardiovascular: RRR, S1/S2, no rubs, no gallops  Respiratory: CTA bilaterally, no wheezing, no crackles, no rhonchi  Abdomen: Soft/+BS, epigastric and RUQ tender, non distended, no guarding  Extremities: No edema, No lymphangitis, No petechiae, No rashes, no synovitis   Data Reviewed: I have personally reviewed following labs and imaging studies Basic Metabolic Panel:  Recent Labs Lab 09/21/16 0728 09/22/16 1423 09/25/16 0555 09/25/16 2037 09/27/16 0435  NA 138 139 136  --  137  K 3.9 4.1 3.8  --  3.7  CL 110 105 101  --  104  CO2 22 25 29   --  26  GLUCOSE 74 338* 353* 550* 240*  BUN 14 5* <5*  --  <5*  CREATININE 0.45 0.49 0.42*  --  0.39*  CALCIUM 9.0 8.7* 8.3*  --  8.6*   Liver Function  Tests:  Recent Labs Lab 09/21/16 0728 09/22/16 1423 09/25/16 0555 09/27/16 0435  AST 27 24 19 22   ALT 15 15 12* 14  ALKPHOS 76 77 70 74  BILITOT 1.2 0.5 0.5 0.2*  PROT 6.8 6.8 5.9* 6.1*  ALBUMIN 3.5 3.5 3.0* 3.1*   No results for input(s): LIPASE, AMYLASE in the last 168 hours. No results for input(s): AMMONIA in the last 168 hours. Coagulation Profile: No results for input(s): INR, PROTIME in the last 168 hours. CBC:  Recent Labs Lab 09/21/16 0728 09/25/16 0555 09/27/16 0435  WBC 8.5 6.7 7.9  NEUTROABS 4.1 2.7  --   HGB 11.9* 10.2* 10.1*  HCT 36.1 31.0* 30.6*  MCV 88.3 88.8 88.2  PLT 628* 402* 343   Cardiac Enzymes: No results for input(s): CKTOTAL, CKMB, CKMBINDEX, TROPONINI in the last 168 hours. BNP: Invalid input(s): POCBNP CBG:  Recent Labs Lab 09/26/16 1603 09/26/16 2250 09/27/16 0204 09/27/16 0754 09/27/16 1112  GLUCAP 258* 264* 143* 222* 136*   HbA1C:  Recent Labs  09/25/16 0555  HGBA1C 9.3*   Urine analysis:    Component Value Date/Time   COLORURINE YELLOW 09/20/2016 1428   APPEARANCEUR HAZY (A) 09/20/2016 1428   LABSPEC 1.030 09/20/2016 1428   PHURINE 8.0 09/20/2016 1428   GLUCOSEU 150 (A) 09/20/2016 1428   HGBUR NEGATIVE 09/20/2016 1428   BILIRUBINUR NEGATIVE 09/20/2016 1428   KETONESUR 80 (A) 09/20/2016 1428   PROTEINUR 30 (A) 09/20/2016 1428   UROBILINOGEN 0.2 05/06/2015 0045   NITRITE NEGATIVE 09/20/2016 1428   LEUKOCYTESUR NEGATIVE 09/20/2016 1428   Sepsis Labs: @LABRCNTIP (procalcitonin:4,lacticidven:4) )No results found for this or any previous visit (from the past 240 hour(s)).   Scheduled Meds: . cholecalciferol  1,000 Units Oral Daily  . enoxaparin (LOVENOX) injection  40 mg Subcutaneous Q24H  . famotidine (PEPCID) IV  20 mg Intravenous Q12H  . ferrous sulfate  325 mg Oral Q breakfast  . gabapentin  100 mg Oral TID  . gabapentin  200 mg Oral QHS  . insulin aspart  0-5 Units Subcutaneous QHS  . insulin aspart  0-9  Units Subcutaneous TID WC  . insulin glargine  15 Units Subcutaneous QHS  . lipase/protease/amylase  36,000 Units Oral TID WC   Continuous Infusions: . sodium chloride 100 mL/hr at 09/27/16 1607    Procedures/Studies: Ct Abdomen Pelvis W Contrast  Result Date: 09/20/2016 CLINICAL DATA:  PMHx of DM on insulin, DKA, GERD, and IBS, who presents to the Emergency Department for an evaluation of hyperglycemia that began 2 days ago. Pt reports she noticed her blood sugar "spiked" yesterday with associated left-sided abdominal pain, nausea, vomiting. History of DKA. EXAM: CT ABDOMEN AND PELVIS WITH CONTRAST TECHNIQUE: Multidetector CT imaging of the abdomen and pelvis was performed using the standard protocol following bolus administration of intravenous contrast. CONTRAST:  45mL ISOVUE-300 IOPAMIDOL (ISOVUE-300) INJECTION 61%, 160mL ISOVUE-300 IOPAMIDOL (ISOVUE-300) INJECTION 61% COMPARISON:  08/20/2016, 08/19/2016 FINDINGS: Lower chest: No acute abnormality. Hepatobiliary: The liver is homogeneous.  No focal liver lesion. Gallbladder is present and normal in CT appearance. Pancreas: Pancreatic duct stent has been placed since prior exams, decompressing the previously dilated pancreatic duct. No evidence for pancreatic mass. Spleen: Normal in size without focal abnormality. Left upper quadrant splenule is present in the region of the splenic hilum. Adrenals/Urinary Tract: Normal adrenal glands. Normal appearance of both kidneys. No hydronephrosis or ureteral abnormality. Stomach/Bowel: The stomach and small bowel loops are normal in appearance. Colonic loops are normal in appearance. Appendix is not seen. Vascular/Lymphatic: No significant vascular findings are present. No enlarged abdominal or pelvic lymph nodes. Reproductive: The uterus is present.  No adnexal mass. Other: No abdominal wall hernia or abnormality. No abdominopelvic ascites. Musculoskeletal: No acute or significant osseous findings. IMPRESSION:  1. Interval placement of pancreatic duct stent, decompressing the previously dilated pancreatic duct. 2. No bowel obstruction or evidence for abscess. Electronically Signed   By: Nolon Nations M.D.   On: 09/20/2016 16:25    Zale Marcotte, DO  [facility] Pager [phone]  If 7PM-7AM, please contact night-coverage [email or web address] Password [email or web address] 09/27/2016, 1:58 PM   LOS: 7 days

## 2016-09-27 NOTE — Progress Notes (Signed)
Patient states she is tolerating full liquids. She does experience nausea but no vomiting. She says her pain score ranges between 3 and 7. She has never pain-free. She says pain is mild this soon after she takes pain medication. She had normal bowel movement today. She says most of her pain is in epigastric region and left mid abdomen. Abdominal exam reveals mild tenderness in midepigastric region and left mid abdomen. Organomegaly or masses.  H&H 10.1 and 30.6 Left: 343K. Serum calcium 8.6 Serum albumin 3.1 AST 22 and ALT 14.  Assessment; Acute on chronic pancreatitis secondary to hereditary pancreatitis diagnosed at age 26 with multiple complications along the way. She had pancreatic stenting for high-grade stricture on 08/28/2016 which provided relief only for 1 week. Markers do not suggest severe pancreatitis. CT on admission negative for fluid collection. I contacted patient's GI GI physicians at [facility] by Dr. Jerene Pitch and Dr. Delrae Alfred are not on call this weekend.  Recommendations: As discussed with Dr. Carles Collet she could be transitioned to by mouth Percocet on schedule along with when necessary fentanyl IV. Continue full liquids for now.

## 2016-09-28 LAB — GLUCOSE, CAPILLARY
GLUCOSE-CAPILLARY: 88 mg/dL (ref 65–99)
GLUCOSE-CAPILLARY: 299 mg/dL — AB (ref 65–99)
GLUCOSE-CAPILLARY: 127 mg/dL — AB (ref 65–99)
GLUCOSE-CAPILLARY: 65 mg/dL (ref 65–99)
Glucose-Capillary: 153 mg/dL — ABNORMAL HIGH (ref 65–99)

## 2016-09-28 MED ORDER — SODIUM CHLORIDE 0.9 % IV SOLN: INTRAVENOUS | Status: DC

## 2016-09-28 MED ORDER — POTASSIUM CHLORIDE IN NACL 20-0.9 MEQ/L-% IV SOLN
INTRAVENOUS | Status: DC
  Administered 2016-09-28 – 2016-09-29 (×3): via INTRAVENOUS

## 2016-09-28 NOTE — Progress Notes (Addendum)
PROGRESS NOTE  Linda Walls ZOX:[id number] DOB: March 15, 1991 DOA: 09/20/2016 PCP: [name], MD   Brief History:  26 y.o.femalewith medical history significant for hereditary pancreatitis with pancreatic insufficiency and secondary diabetes mellitus, GERD, and migraines who presents to the emergency department with abdominal pain, nausea, and nonbloody vomiting. 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 The patient was recently transferred to [facility] on 08/25/16. At [facility], the patient was admitted there from 08/25/2016 through 09/13/2016. During that admission, the patient had an ERCP and pancreatic stent placement on 08/28/2016. The patient had significant difficulty with advancement of her diet and required NG enteral feedings for a brief period of time. At the time of this admission, CT of the abdomen and pelvis was negative for any acute findings. The patient was placed on bowel rest initially, and her diet was gradually advanced.  Assessment/Plan: Acute on chronic pancreatitis -appreciate GI follow up. Discussed with Dr. [name] 4/1 -09/25/16--vomiting after eating soft diet for breakfast, then increase abd pain -down gradedto clear liquids 3/29 -advanced to full liquids 09/27/16--based upon CBGs (elevated), pt may be eating more that she is endorsing -still having pain with full liquids -platelets trending down, suggesting clinical improvement -no fever or tachycardia, LFTs normal -continue IVF -Continue Creon -start 48 hr calorie caount  Diabetes mellitus secondary to pancreatic insufficiency -Continue Lantus 15 units -Continue NovoLog sliding scale--change to sensitive scale -3/29--HbA1c--9.3  Thrombocytosis -Likely acute phase reactant -improving-->suggests clinical  improvement?  SVT -pt had short run <5 seconds am 3/29 -remains in sinus since then -defer starting meds for now  Disposition Plan: Home when able to tolerate soft diet Family Communication: Mother updated at bedside 4/1   Consultants: GI  Code Status: FULL  DVT Prophylaxis: [place] Lovenox   Procedures: As Listed in Progress Note Above  Antibiotics: None   Subjective: Overall, pain is better but still significant full liquid diet. Denies any vomiting, diarrhea, chest pain, short of breath. She has had a bowel movement in the last 2 days. No hematochezia or melena  Objective: Vitals:   09/27/16 1453 09/27/16 2128 09/28/16 0536 09/28/16 1432  BP: 117/87 125/86 110/79 131/85  Pulse: 72 78 80 76  Resp: 16 16 16 16   Temp: 98.6 F (37 C) 99.2 F (37.3 C) 98.2 F (36.8 C) 99.1 F (37.3 C)  TempSrc: Oral Oral Oral Oral  SpO2: 100% 99% 100% 95%  Weight:      Height:        Intake/Output Summary (Last 24 hours) at 09/28/16 1635 Last data filed at 09/28/16 0700  Gross per 24 hour  Intake             7440 ml  Output             2000 ml  Net             5440 ml   Weight change:  Exam:   General:  Pt is alert, follows commands appropriately, not in acute distress  HEENT: No icterus, No thrush, No neck mass, [place]/AT  Cardiovascular: RRR, S1/S2, no rubs, no gallops  Respiratory: CTA bilaterally, no wheezing, no crackles, no rhonchi  Abdomen: Soft/+BS, epigastric and RUQ tender, non distended, no guarding  Extremities: No edema, No lymphangitis, No petechiae, No rashes, no synovitis   Data Reviewed: I have personally  reviewed following labs and imaging studies Basic Metabolic Panel:  Recent Labs Lab 09/22/16 1423 09/25/16 0555 09/25/16 2037 09/27/16 0435  NA 139 136  --  137  K 4.1 3.8  --  3.7  CL 105 101  --  104  CO2 25 29  --  26  GLUCOSE 338* 353* 550* 240*  BUN 5* <5*  --  <5*  CREATININE 0.49 0.42*  --  0.39*  CALCIUM 8.7* 8.3*   --  8.6*   Liver Function Tests:  Recent Labs Lab 09/22/16 1423 09/25/16 0555 09/27/16 0435  AST 24 19 22   ALT 15 12* 14  ALKPHOS 77 70 74  BILITOT 0.5 0.5 0.2*  PROT 6.8 5.9* 6.1*  ALBUMIN 3.5 3.0* 3.1*   No results for input(s): LIPASE, AMYLASE in the last 168 hours. No results for input(s): AMMONIA in the last 168 hours. Coagulation Profile: No results for input(s): INR, PROTIME in the last 168 hours. CBC:  Recent Labs Lab 09/25/16 0555 09/27/16 0435  WBC 6.7 7.9  NEUTROABS 2.7  --   HGB 10.2* 10.1*  HCT 31.0* 30.6*  MCV 88.8 88.2  PLT 402* 343   Cardiac Enzymes: No results for input(s): CKTOTAL, CKMB, CKMBINDEX, TROPONINI in the last 168 hours. BNP: Invalid input(s): POCBNP CBG:  Recent Labs Lab 09/27/16 1112 09/27/16 1647 09/27/16 2103 09/28/16 0739 09/28/16 1117  GLUCAP 136* 188* 320* 88 299*   HbA1C: No results for input(s): HGBA1C in the last 72 hours. Urine analysis:    Component Value Date/Time   COLORURINE YELLOW 09/20/2016 1428   APPEARANCEUR HAZY (A) 09/20/2016 1428   LABSPEC 1.030 09/20/2016 1428   PHURINE 8.0 09/20/2016 1428   GLUCOSEU 150 (A) 09/20/2016 1428   HGBUR NEGATIVE 09/20/2016 1428   BILIRUBINUR NEGATIVE 09/20/2016 1428   KETONESUR 80 (A) 09/20/2016 1428   PROTEINUR 30 (A) 09/20/2016 1428   UROBILINOGEN 0.2 05/06/2015 0045   NITRITE NEGATIVE 09/20/2016 1428   LEUKOCYTESUR NEGATIVE 09/20/2016 1428   Sepsis Labs: @LABRCNTIP (procalcitonin:4,lacticidven:4) )No results found for this or any previous visit (from the past 240 hour(s)).   Scheduled Meds: . cholecalciferol  1,000 Units Oral Daily  . enoxaparin (LOVENOX) injection  40 mg Subcutaneous Q24H  . famotidine  20 mg Oral BID  . ferrous sulfate  325 mg Oral Q breakfast  . gabapentin  100 mg Oral TID  . gabapentin  200 mg Oral QHS  . insulin aspart  0-5 Units Subcutaneous QHS  . insulin aspart  0-9 Units Subcutaneous TID WC  . insulin glargine  15 Units Subcutaneous  QHS  . lipase/protease/amylase  36,000 Units Oral TID WC   Continuous Infusions: . 0.9 % NaCl with KCl 20 mEq / L 100 mL/hr at 09/28/16 1437    Procedures/Studies: Ct Abdomen Pelvis W Contrast  Result Date: 09/20/2016 CLINICAL DATA:  PMHx of DM on insulin, DKA, GERD, and IBS, who presents to the Emergency Department for an evaluation of hyperglycemia that began 2 days ago. Pt reports she noticed her blood sugar "spiked" yesterday with associated left-sided abdominal pain, nausea, vomiting. History of DKA. EXAM: CT ABDOMEN AND PELVIS WITH CONTRAST TECHNIQUE: Multidetector CT imaging of the abdomen and pelvis was performed using the standard protocol following bolus administration of intravenous contrast. CONTRAST:  57mL ISOVUE-300 IOPAMIDOL (ISOVUE-300) INJECTION 61%, 177mL ISOVUE-300 IOPAMIDOL (ISOVUE-300) INJECTION 61% COMPARISON:  08/20/2016, 08/19/2016 FINDINGS: Lower chest: No acute abnormality. Hepatobiliary: The liver is homogeneous. No focal liver lesion. Gallbladder is present and normal in CT appearance.  Pancreas: Pancreatic duct stent has been placed since prior exams, decompressing the previously dilated pancreatic duct. No evidence for pancreatic mass. Spleen: Normal in size without focal abnormality. Left upper quadrant splenule is present in the region of the splenic hilum. Adrenals/Urinary Tract: Normal adrenal glands. Normal appearance of both kidneys. No hydronephrosis or ureteral abnormality. Stomach/Bowel: The stomach and small bowel loops are normal in appearance. Colonic loops are normal in appearance. Appendix is not seen. Vascular/Lymphatic: No significant vascular findings are present. No enlarged abdominal or pelvic lymph nodes. Reproductive: The uterus is present.  No adnexal mass. Other: No abdominal wall hernia or abnormality. No abdominopelvic ascites. Musculoskeletal: No acute or significant osseous findings. IMPRESSION: 1. Interval placement of pancreatic duct stent,  decompressing the previously dilated pancreatic duct. 2. No bowel obstruction or evidence for abscess. Electronically Signed   By: Nolon Nations M.D.   On: 09/20/2016 16:25    Westley Blass, DO  [facility] Pager [phone]  If 7PM-7AM, please contact night-coverage [email or web address] Password [email or web address] 09/28/2016, 4:35 PM   LOS: 8 days

## 2016-09-29 ENCOUNTER — Ambulatory Visit: Payer: BC Managed Care – PPO | Admitting: Family Medicine

## 2016-09-29 ENCOUNTER — Inpatient Hospital Stay (HOSPITAL_COMMUNITY): Payer: BC Managed Care – PPO

## 2016-09-29 DIAGNOSIS — E0865 Diabetes mellitus due to underlying condition with hyperglycemia: Secondary | ICD-10-CM

## 2016-09-29 DIAGNOSIS — K859 Acute pancreatitis without necrosis or infection, unspecified: Secondary | ICD-10-CM

## 2016-09-29 DIAGNOSIS — R112 Nausea with vomiting, unspecified: Secondary | ICD-10-CM

## 2016-09-29 DIAGNOSIS — K861 Other chronic pancreatitis: Secondary | ICD-10-CM

## 2016-09-29 LAB — GLUCOSE, CAPILLARY
GLUCOSE-CAPILLARY: 150 mg/dL — AB (ref 65–99)
GLUCOSE-CAPILLARY: 59 mg/dL — AB (ref 65–99)
Glucose-Capillary: 270 mg/dL — ABNORMAL HIGH (ref 65–99)
Glucose-Capillary: 150 mg/dL — ABNORMAL HIGH (ref 65–99)
Glucose-Capillary: 313 mg/dL — ABNORMAL HIGH (ref 65–99)
Glucose-Capillary: 246 mg/dL — ABNORMAL HIGH (ref 65–99)

## 2016-09-29 LAB — COMPREHENSIVE METABOLIC PANEL WITH GFR
ALT: 22 U/L (ref 14–54)
AST: 28 U/L (ref 15–41)
Albumin: 3.1 g/dL — ABNORMAL LOW (ref 3.5–5.0)
Alkaline Phosphatase: 88 U/L (ref 38–126)
Anion gap: 6 (ref 5–15)
BUN: <5 mg/dL — ABNORMAL LOW (ref 6–20)
CO2: 27 mmol/L (ref 22–32)
Calcium: 8.9 mg/dL (ref 8.9–10.3)
Chloride: 104 mmol/L (ref 101–111)
Creatinine, Ser: 0.36 mg/dL — ABNORMAL LOW (ref 0.44–1.00)
GFR calc Af Amer: >60 mL/min
GFR calc non Af Amer: >60 mL/min
Glucose, Bld: 230 mg/dL — ABNORMAL HIGH (ref 65–99)
Potassium: 4.2 mmol/L (ref 3.5–5.1)
Sodium: 137 mmol/L (ref 135–145)
Total Bilirubin: 0.5 mg/dL (ref 0.3–1.2)
Total Protein: 6.1 g/dL — ABNORMAL LOW (ref 6.5–8.1)

## 2016-09-29 LAB — CBC
HCT: 34.3 % — ABNORMAL LOW (ref 36.0–46.0)
Hemoglobin: 11.3 g/dL — ABNORMAL LOW (ref 12.0–15.0)
MCH: 29.2 pg (ref 26.0–34.0)
MCHC: 32.9 g/dL (ref 30.0–36.0)
MCV: 88.6 fL (ref 78.0–100.0)
Platelets: 291 K/uL (ref 150–400)
RBC: 3.87 MIL/uL (ref 3.87–5.11)
RDW: 14 % (ref 11.5–15.5)
WBC: 7.1 K/uL (ref 4.0–10.5)

## 2016-09-29 LAB — MAGNESIUM: Magnesium: 1.6 mg/dL — ABNORMAL LOW (ref 1.7–2.4)

## 2016-09-29 MED ORDER — BUTAMBEN-TETRACAINE-BENZOCAINE 2-2-14 % EX AERO
INHALATION_SPRAY | CUTANEOUS | Status: AC
  Filled 2016-09-29: qty 20

## 2016-09-29 MED ORDER — IOPAMIDOL (ISOVUE-300) INJECTION 61%
INTRAVENOUS | Status: AC
  Administered 2016-09-29: 20 mL
  Filled 2016-09-29: qty 50

## 2016-09-29 MED ORDER — DEXTROSE 50 % IV SOLN
INTRAVENOUS | Status: AC
  Filled 2016-09-29: qty 50

## 2016-09-29 MED ORDER — HYDROMORPHONE HCL 1 MG/ML IJ SOLN
1.0000 mg | INTRAMUSCULAR | Status: DC | PRN
  Administered 2016-09-29 – 2016-09-30 (×11): 1 mg via INTRAVENOUS
  Filled 2016-09-29 (×11): qty 1

## 2016-09-29 MED ORDER — GLUCERNA 1.2 CAL PO LIQD
1500.0000 mL | ORAL | Status: DC
  Administered 2016-09-29: 1000 mL
  Filled 2016-09-29: qty 1500

## 2016-09-29 MED ORDER — DEXTROSE 50 % IV SOLN
25.0000 mL | Freq: Once | INTRAVENOUS | Status: AC
  Administered 2016-09-29: 25 mL via INTRAVENOUS

## 2016-09-29 MED ORDER — INSULIN ASPART 100 UNIT/ML ~~LOC~~ SOLN
0.0000 [IU] | SUBCUTANEOUS | Status: DC
  Administered 2016-09-29 – 2016-10-01 (×4): 3 [IU] via SUBCUTANEOUS
  Administered 2016-10-01: 7 [IU] via SUBCUTANEOUS
  Administered 2016-10-02: 1 [IU] via SUBCUTANEOUS
  Administered 2016-10-02: 3 [IU] via SUBCUTANEOUS
  Administered 2016-10-02: 5 [IU] via SUBCUTANEOUS
  Administered 2016-10-02: 2 [IU] via SUBCUTANEOUS
  Administered 2016-10-02: 3 [IU] via SUBCUTANEOUS
  Administered 2016-10-02 – 2016-10-03 (×2): 2 [IU] via SUBCUTANEOUS
  Administered 2016-10-03: 9 [IU] via SUBCUTANEOUS
  Administered 2016-10-03: 2 [IU] via SUBCUTANEOUS

## 2016-09-29 MED ORDER — DEXTROSE-NACL 5-0.45 % IV SOLN
INTRAVENOUS | Status: DC
  Administered 2016-09-29 – 2016-10-02 (×7): via INTRAVENOUS

## 2016-09-29 NOTE — Progress Notes (Signed)
Patient remains with intermittent nausea and moderate pain primarily in epigastric and and left midabdomen. She is requiring pain medication every 2-3 hours. Abdominal exam is unchanged. Abdomen is soft with mild to moderate midepigastric tenderness and mild tenderness in left midabdomen. Patient's condition discussed with Dr. Harley Hallmark of [facility] placed biliary stent 1 month ago. He recommends nasojejunal feeding.  Plan; Will request fluoroscopic placement of nasojejunal tube for feeding. Dietary consultation for tube feeding recommendations. Patient will be nothing by mouth except for by mouth medications.

## 2016-09-29 NOTE — Progress Notes (Signed)
Initial Nutrition Assessment   INTERVENTION:   Recommend: Once NJ is placed and verified (below ligament of Treitz) : Initiate Glucerna 1.2 @ 25 ml/hr via Nasojejunal tube and increase by 10 ml every 6 hours to goal rate of 60 ml/hr.   Tube feeding regimen provides 1728 kcal (~100% of needs), 72 grams of protein, and 966 ml of H2O.     NUTRITION DIAGNOSIS:   Inadequate oral intake related to altered GI function (acute on chronic pancreatitis) as evidenced by NPO status (Pt unable to tolerate diet advancement NJ tube being placed by IR).    GOAL:   Patient will meet greater than or equal to 90% of their needs    MONITOR:   TF tolerance, Weight trends, Labs  REASON FOR ASSESSMENT:   Consult Enteral/tube feeding initiation and management  ASSESSMENT: 26 y.o.femalewith medical history significant for hereditary pancreatitis with pancreatic insufficiency and secondary diabetes mellitus, GERD, and migraines who presents to the emergency department with abdominal pain, nausea, and nonbloody vomiting. 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  The patient was recently transferred to [facility] on 08/25/16. At [facility], the patient was admitted there from 08/25/2016 through 09/13/2016. During that admission, the patient had an ERCP and pancreatic stent placement on 08/28/2016. The patient had significant difficulty with advancement of her diet and required NG enteral feedings for a brief period of time. At the time of this admission, CT of the abdomen and pelvis was negative for any acute findings. The patient was placed on bowel rest initially, and her diet was gradually advanced.  Patient having difficulty tolerating full diet advancement. Her weight has trended down over the past few months which is likely associated with  her  increased protein and energy needs related to inflammation and limited tolerance or oral intake. Usual wt 52-54 kg.   Patient is to have Nasojejunal tube placed. Dietitian consulted to provide recommendations. Agree with jejunal feeding and suggest polymeric formula.   Nutrition-Focused physical exam deferred at this time.    Recent Labs Lab 09/25/16 0555 09/25/16 2037 09/27/16 0435 09/29/16 0534  NA 136  --  137 137  K 3.8  --  3.7 4.2  CL 101  --  104 104  CO2 29  --  26 27  BUN <5*  --  <5* <5*  CREATININE 0.42*  --  0.39* 0.36*  CALCIUM 8.3*  --  8.6* 8.9  MG  --   --   --  1.6*  GLUCOSE 353* 550* 240* 230*   Labs and Meds reviewed.   Diet Order:  Diet NPO time specified Except for: Sips with Meds  Skin:  Reviewed, no issues  Last BM:  09/28/16  Height:   Ht Readings from Last 1 Encounters:  09/20/16 5\' 2"  (1.575 m)    Weight:   Wt Readings from Last 1 Encounters:  09/20/16 110 lb (49.9 kg)    Ideal Body Weight:  50 kg  BMI:  Body mass index is 20.12 kg/m.  Estimated Nutritional Needs:   Kcal:  1500-1750 (30-35 kcal/kg)  Protein:  75 gr (1.5 gr/kg bw)  Fluid:  1.5-1.8 liters daily  EDUCATION NEEDS:   No education needs identified at this time Colman Cater MS,RD,CSG,LDN Office: #[phone] Pager: [phone]

## 2016-09-29 NOTE — Progress Notes (Signed)
Calorie Count   Per MD order pt to have calorie count competed.  There is no evidence in her room that it has been started yet  therefore RD will initiate with breakfast today and continue through breakfast on Wednesday.  Report of results will follow completion.  Colman Cater MS,RD,CSG,LDN Office: [phone] Pager: [phone]

## 2016-09-29 NOTE — Progress Notes (Signed)
PROGRESS NOTE  Linda Walls ESP:[id number] DOB: 09/26/1990 DOA: 09/20/2016 PCP: [name], MD Brief History: 26 y.o.femalewith medical history significant for hereditary pancreatitis with pancreatic insufficiency and secondary diabetes mellitus, GERD, and migraines who presents to the emergency department with abdominal pain, nausea, and nonbloody vomiting. 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 The patient was recently transferred to [facility] on 08/25/16. At [facility], the patient was admitted there from 08/25/2016 through 09/13/2016. During that admission, the patient had an ERCP and pancreatic stent placement on 08/28/2016. The patient had significant difficulty with advancement of her diet and required NG enteral feedings for a brief period of time. At the time of this admission, CT of the abdomen and pelvis was negative for any acute findings. The patient was placed on bowel rest initially, and her diet was gradually advanced. Unfortunately, the patient continues to have significant pain despite full liquids. After discussion with the patient's gastroenterologist at [facility], a nasojejunal tube was placed and the patient was started on enteral feedings.  Assessment/Plan: Acute on chronic pancreatitis -appreciate GI follow up. Discussed with Dr. [name] 4/2 -09/25/16--vomiting after eating soft diet for breakfast, then increase abd pain -down gradedto clear liquids 3/29 -advanced to full liquids 09/27/16 -still having significant pain with full liquids -platelets trending down, suggesting clinical improvement but still significant pain -start enteral feeding--IR requested to plans nasojejunal tube 4/2 -switch to IV hydromorphone--may need PCA if remains poorly controlled with intermittent dosing -Continue  Creon  Diabetes mellitus secondary to pancreatic insufficiency -Continue Lantus 15 units -Continue NovoLog sliding scale--change to sensitive scale and q 4 hour checks since pt now on enteral feeding -3/29--HbA1c--9.3  Thrombocytosis -Likely acute phase reactant -improving-->suggests clinical improvement?  SVT -pt had short run <5 seconds am 3/29 -remains in sinus since then -defer starting meds for now  Disposition Plan: Not stable for discharge Family Communication: Mother updated at bedside 4/1   Consultants: GI  Code Status: FULL  DVT Prophylaxis: [place] Lovenox   Procedures: As Listed in Progress Note Above  Antibiotics: None    Subjective: Patient is still having significant abdominal pain with full liquids. She feels nauseous; denies any emesis. Denies any fevers, chills, chest pain or shortness of breath, diarrhea. Denies any dysuria, hematuria, headaches.  Objective: Vitals:   09/28/16 1432 09/28/16 2205 09/29/16 0606 09/29/16 1507  BP: 131/85 114/83 (!) 115/92 (!) 127/91  Pulse: 76 74 70 94  Resp: 16 16 16 16   Temp: 99.1 F (37.3 C) 98.6 F (37 C) 98 F (36.7 C) 99.4 F (37.4 C)  TempSrc: Oral Oral Oral Oral  SpO2: 95% 100% 98% 100%  Weight:      Height:        Intake/Output Summary (Last 24 hours) at 09/29/16 1551 Last data filed at 09/29/16 1500  Gross per 24 hour  Intake             3100 ml  Output                0 ml  Net             3100 ml   Weight change:  Exam:   General:  Pt is alert, follows commands appropriately, not in acute distress  HEENT: No icterus, No thrush, No neck mass, [place]/AT  Cardiovascular: RRR, S1/S2, no  rubs, no gallops  Respiratory: CTA bilaterally, no wheezing, no crackles, no rhonchi  Abdomen: Soft/+BS, non tender, non distended, no guarding  Extremities: No edema, No lymphangitis, No petechiae, No rashes, no synovitis   Data Reviewed: I have personally reviewed following labs and  imaging studies Basic Metabolic Panel:  Recent Labs Lab 09/25/16 0555 09/25/16 2037 09/27/16 0435 09/29/16 0534  NA 136  --  137 137  K 3.8  --  3.7 4.2  CL 101  --  104 104  CO2 29  --  26 27  GLUCOSE 353* 550* 240* 230*  BUN <5*  --  <5* <5*  CREATININE 0.42*  --  0.39* 0.36*  CALCIUM 8.3*  --  8.6* 8.9  MG  --   --   --  1.6*   Liver Function Tests:  Recent Labs Lab 09/25/16 0555 09/27/16 0435 09/29/16 0534  AST 19 22 28   ALT 12* 14 22  ALKPHOS 70 74 88  BILITOT 0.5 0.2* 0.5  PROT 5.9* 6.1* 6.1*  ALBUMIN 3.0* 3.1* 3.1*   No results for input(s): LIPASE, AMYLASE in the last 168 hours. No results for input(s): AMMONIA in the last 168 hours. Coagulation Profile: No results for input(s): INR, PROTIME in the last 168 hours. CBC:  Recent Labs Lab 09/25/16 0555 09/27/16 0435 09/29/16 0534  WBC 6.7 7.9 7.1  NEUTROABS 2.7  --   --   HGB 10.2* 10.1* 11.3*  HCT 31.0* 30.6* 34.3*  MCV 88.8 88.2 88.6  PLT 402* 343 291   Cardiac Enzymes: No results for input(s): CKTOTAL, CKMB, CKMBINDEX, TROPONINI in the last 168 hours. BNP: Invalid input(s): POCBNP CBG:  Recent Labs Lab 09/28/16 2104 09/28/16 2153 09/29/16 0149 09/29/16 0753 09/29/16 1158  GLUCAP 65 153* 270* 150* 313*   HbA1C: No results for input(s): HGBA1C in the last 72 hours. Urine analysis:    Component Value Date/Time   COLORURINE YELLOW 09/20/2016 1428   APPEARANCEUR HAZY (A) 09/20/2016 1428   LABSPEC 1.030 09/20/2016 1428   PHURINE 8.0 09/20/2016 1428   GLUCOSEU 150 (A) 09/20/2016 1428   HGBUR NEGATIVE 09/20/2016 1428   BILIRUBINUR NEGATIVE 09/20/2016 1428   KETONESUR 80 (A) 09/20/2016 1428   PROTEINUR 30 (A) 09/20/2016 1428   UROBILINOGEN 0.2 05/06/2015 0045   NITRITE NEGATIVE 09/20/2016 1428   LEUKOCYTESUR NEGATIVE 09/20/2016 1428   Sepsis Labs: @LABRCNTIP (procalcitonin:4,lacticidven:4) )No results found for this or any previous visit (from the past 240 hour(s)).   Scheduled  Meds: . cholecalciferol  1,000 Units Oral Daily  . enoxaparin (LOVENOX) injection  40 mg Subcutaneous Q24H  . famotidine  20 mg Oral BID  . ferrous sulfate  325 mg Oral Q breakfast  . gabapentin  100 mg Oral TID  . gabapentin  200 mg Oral QHS  . insulin aspart  0-5 Units Subcutaneous QHS  . insulin aspart  0-9 Units Subcutaneous TID WC  . insulin glargine  15 Units Subcutaneous QHS  . lipase/protease/amylase  36,000 Units Oral TID WC   Continuous Infusions: . 0.9 % NaCl with KCl 20 mEq / L 100 mL/hr at 09/29/16 0920  . feeding supplement (GLUCERNA 1.2 CAL)      Procedures/Studies: Ct Abdomen Pelvis W Contrast  Result Date: 09/20/2016 CLINICAL DATA:  PMHx of DM on insulin, DKA, GERD, and IBS, who presents to the Emergency Department for an evaluation of hyperglycemia that began 2 days ago. Pt reports she noticed her blood sugar "spiked" yesterday with associated left-sided abdominal pain, nausea, vomiting.  History of DKA. EXAM: CT ABDOMEN AND PELVIS WITH CONTRAST TECHNIQUE: Multidetector CT imaging of the abdomen and pelvis was performed using the standard protocol following bolus administration of intravenous contrast. CONTRAST:  42mL ISOVUE-300 IOPAMIDOL (ISOVUE-300) INJECTION 61%, 138mL ISOVUE-300 IOPAMIDOL (ISOVUE-300) INJECTION 61% COMPARISON:  08/20/2016, 08/19/2016 FINDINGS: Lower chest: No acute abnormality. Hepatobiliary: The liver is homogeneous. No focal liver lesion. Gallbladder is present and normal in CT appearance. Pancreas: Pancreatic duct stent has been placed since prior exams, decompressing the previously dilated pancreatic duct. No evidence for pancreatic mass. Spleen: Normal in size without focal abnormality. Left upper quadrant splenule is present in the region of the splenic hilum. Adrenals/Urinary Tract: Normal adrenal glands. Normal appearance of both kidneys. No hydronephrosis or ureteral abnormality. Stomach/Bowel: The stomach and small bowel loops are normal in  appearance. Colonic loops are normal in appearance. Appendix is not seen. Vascular/Lymphatic: No significant vascular findings are present. No enlarged abdominal or pelvic lymph nodes. Reproductive: The uterus is present.  No adnexal mass. Other: No abdominal wall hernia or abnormality. No abdominopelvic ascites. Musculoskeletal: No acute or significant osseous findings. IMPRESSION: 1. Interval placement of pancreatic duct stent, decompressing the previously dilated pancreatic duct. 2. No bowel obstruction or evidence for abscess. Electronically Signed   By: Nolon Nations M.D.   On: 09/20/2016 16:25   Dg Fluoro Rm 1-60 Min  Result Date: 09/29/2016 CLINICAL DATA:  Chronic pancreatitis.  Weight loss. EXAM: FLOURO RM 1-60 MIN CONTRAST:  Isovue-300 FLUOROSCOPY TIME:  Fluoroscopy Time:  4 minutes 54 seconds Radiation Exposure Index (if provided by the fluoroscopic device): 14.5 mGy Number of Acquired Spot Images: 0 COMPARISON:  Abdominal CT 09/20/2016 FINDINGS: A weighted tip feeding tube was easily advanced through the right nostril to the pylorus. The duodenal bulb was easily cannulated, but the tube could not be advanced through the duodenal C-loop. Contrast was injected and very there was definite stasis in the proximal duodenum. No proximal duodenal distention to suggest a chronic stricture. The patient was left on her right side for 10 minutes, but the tube did not progressed during that time. Material from most recent meal was seen peristalsing in the moderately distended stomach. At 5 minutes of fluoroscopy, the decision was made to rest the bowel and allow for gravity advancement. Abundance slack was left in the stomach and the tube was positioned at the bulb/pylorus. The patient was instructed to lay on her right side as much as possible. IMPRESSION: Weighted tip feeding tube was easily advanced to duodenal bulb, but could not be advanced into the distal duodenum. Injected contrast showed definite stasis  in the proximal duodenum and food from most recent meal was also retained in the stomach. The tube tip was positioned at the bulb with adequate slack to allow for progression. The tube may need additional manipulation, suggest KUB tomorrow. Electronically Signed   By: Monte Fantasia M.D.   On: 09/29/2016 15:44    Tysheem Accardo, DO  [facility] Pager [phone]  If 7PM-7AM, please contact night-coverage [email or web address] Password [email or web address] 09/29/2016, 3:51 PM   LOS: 9 days

## 2016-09-29 NOTE — Progress Notes (Signed)
NJ feeding tube was connected to feeding, feeding ran x 30 minutes then pump beeping occluded, attempted to flush feeding tube, unable to flush, Dr. Oneida Alar notified, patient will be kept NPO and feeding tube in place until morning when Dr. [name] rounds.

## 2016-09-30 ENCOUNTER — Inpatient Hospital Stay (HOSPITAL_COMMUNITY): Payer: BC Managed Care – PPO

## 2016-09-30 LAB — GLUCOSE, CAPILLARY
GLUCOSE-CAPILLARY: 89 mg/dL (ref 65–99)
GLUCOSE-CAPILLARY: 80 mg/dL (ref 65–99)
GLUCOSE-CAPILLARY: 160 mg/dL — AB (ref 65–99)
GLUCOSE-CAPILLARY: 150 mg/dL — AB (ref 65–99)
Glucose-Capillary: 208 mg/dL — ABNORMAL HIGH (ref 65–99)
Glucose-Capillary: 222 mg/dL — ABNORMAL HIGH (ref 65–99)
Glucose-Capillary: 283 mg/dL — ABNORMAL HIGH (ref 65–99)
Glucose-Capillary: 49 mg/dL — ABNORMAL LOW (ref 65–99)

## 2016-09-30 MED ORDER — GLUCERNA 1.2 CAL PO LIQD
1000.0000 mL | ORAL | Status: DC
  Administered 2016-09-30: 1000 mL
  Filled 2016-09-30 (×2): qty 1000

## 2016-09-30 MED ORDER — DIPHENHYDRAMINE HCL 50 MG/ML IJ SOLN: 12.5000 mg | Freq: Four times a day (QID) | INTRAMUSCULAR | Status: DC | PRN

## 2016-09-30 MED ORDER — SODIUM CHLORIDE 0.9% FLUSH: 9.0000 mL | INTRAVENOUS | Status: DC | PRN

## 2016-09-30 MED ORDER — PROCHLORPERAZINE EDISYLATE 5 MG/ML IJ SOLN
5.0000 mg | Freq: Once | INTRAMUSCULAR | Status: AC
  Administered 2016-09-30: 5 mg via INTRAVENOUS
  Filled 2016-09-30: qty 2

## 2016-09-30 MED ORDER — NALOXONE HCL 0.4 MG/ML IJ SOLN
0.4000 mg | INTRAMUSCULAR | Status: DC | PRN
Start: 2016-09-30 — End: 2016-10-01

## 2016-09-30 MED ORDER — DEXTROSE 50 % IV SOLN
25.0000 mL | Freq: Once | INTRAVENOUS | Status: AC
  Administered 2016-09-30: 25 mL via INTRAVENOUS

## 2016-09-30 MED ORDER — HYDROMORPHONE 1 MG/ML IV SOLN
INTRAVENOUS | Status: DC
  Administered 2016-09-30 – 2016-10-01 (×2): via INTRAVENOUS
  Filled 2016-09-30: qty 25

## 2016-09-30 MED ORDER — DIPHENHYDRAMINE HCL 12.5 MG/5ML PO ELIX: 12.5000 mg | ORAL_SOLUTION | Freq: Four times a day (QID) | ORAL | Status: DC | PRN

## 2016-09-30 MED ORDER — ONDANSETRON HCL 4 MG/2ML IJ SOLN
4.0000 mg | Freq: Four times a day (QID) | INTRAMUSCULAR | Status: DC | PRN
Start: 2016-09-30 — End: 2016-10-01

## 2016-09-30 MED ORDER — SODIUM CHLORIDE 0.9% FLUSH
INTRAVENOUS | Status: AC
  Filled 2016-09-30: qty 10

## 2016-09-30 MED ORDER — GLUCERNA 1.2 CAL PO LIQD
1000.0000 mL | ORAL | Status: DC
  Filled 2016-09-30 (×2): qty 1000

## 2016-09-30 MED ORDER — INSULIN GLARGINE 100 UNIT/ML ~~LOC~~ SOLN
8.0000 [IU] | Freq: Every day | SUBCUTANEOUS | Status: DC
  Administered 2016-10-01 – 2016-10-03 (×4): 8 [IU] via SUBCUTANEOUS
  Filled 2016-09-30 (×6): qty 0.08

## 2016-09-30 MED ORDER — METOCLOPRAMIDE HCL 5 MG/ML IJ SOLN
5.0000 mg | Freq: Four times a day (QID) | INTRAMUSCULAR | Status: AC
  Administered 2016-09-30 – 2016-10-01 (×4): 5 mg via INTRAVENOUS
  Filled 2016-09-30 (×4): qty 2

## 2016-09-30 NOTE — Progress Notes (Signed)
PROGRESS NOTE  KRISTIN BARCUS LXB:[id number] DOB: Mar 08, 1991 DOA: 09/20/2016 PCP: [name], MD  Brief History: 26 y.o.femalewith medical history significant for hereditary pancreatitis with pancreatic insufficiency and secondary diabetes mellitus, GERD, and migraines who presents to the emergency department with abdominal pain, nausea, and nonbloody vomiting. 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 The patient was recently transferred to [facility] on 08/25/16. At [facility], the patient was admitted there from 08/25/2016 through 09/13/2016. During that admission, the patient had an ERCP and pancreatic stent placement on 08/28/2016. The patient had significant difficulty with advancement of her diet and required NG enteral feedings for a brief period of time. At the time of this admission, CT of the abdomen and pelvis was negative for any acute findings. The patient was placed on bowel rest initially, and her diet was gradually advanced. Unfortunately, the patient continues to have significant pain despite full liquids. After discussion with the patient's gastroenterologist at [facility], a nasojejunal tube was placed and the patient was started on enteral feedings. Due to uncontrolled pain, the patient was changed over to a Dilaudid PCA pump.  Assessment/Plan: Acute on chronic pancreatitis -appreciate GI follow up. Discussed with Dr. [name] 4/2 -09/25/16--vomiting after eating soft diet for breakfast, then increase abd pain -down gradedto clear liquids 3/29 -advanced to full liquids 09/27/16 -continued having significant pain with full liquids -platelets trending down, suggesting clinical improvement but still significant pain -4/3 start enteral feeding--IR requested to plans nasojejunal tube 4/2 -4/3 switch to IV  hydromorphone PCA--pain uncontrolled with intermittent dosing -Continue Creon  Diabetes mellitus secondary to pancreatic insufficiency -continue Lantus 15 units--hypoglycemia due to NPO status before enteral feedings were started-->better since TF started -Continue NovoLog sliding scale--change to sensitive scale and q 4 hour checks since pt now on enteral feeding -3/29--HbA1c--9.3  Thrombocytosis -Likely acute phase reactant -improving  SVT -pt had short run <5 seconds am 3/29 -remains in sinus since then -defer starting meds for now  Disposition Plan: Not stable for discharge Family Communication: Mother updated at bedside 4/3   Consultants: GI  Code Status: FULL  DVT Prophylaxis: [place] Lovenox   Procedures: As Listed in Progress Note Above  Antibiotics: None     Subjective: Patient is still having significant pain but a little bit better since changing over to Dilaudid. Denies any fever, chills, chest pain, nausea, vomiting, diarrhea, headache, neck pain.  Objective: Vitals:   09/29/16 2113 09/30/16 0441 09/30/16 0915 09/30/16 1536  BP: 128/75 118/86  124/77  Pulse: 96 66  88  Resp: 20 18  16   Temp: 98.1 F (36.7 C) 97.8 F (36.6 C)  98.9 F (37.2 C)  TempSrc: Oral Oral  Oral  SpO2: 100% 93% 100% 100%  Weight:      Height:        Intake/Output Summary (Last 24 hours) at 09/30/16 1813 Last data filed at 09/30/16 1538  Gross per 24 hour  Intake           831.67 ml  Output             1600 ml  Net          -768.33 ml   Weight change:  Exam:   General:  Pt is alert, follows commands appropriately, not in acute distress  HEENT: No icterus, No thrush, No neck  mass, [place]/AT  Cardiovascular: RRR, S1/S2, no rubs, no gallops  Respiratory: CTA bilaterally, no wheezing, no crackles, no rhonchi  Abdomen: Soft/+BS,epigastric and RUQ tender, non distended, no guarding  Extremities: No edema, No lymphangitis, No petechiae, No rashes, no  synovitis   Data Reviewed: I have personally reviewed following labs and imaging studies Basic Metabolic Panel:  Recent Labs Lab 09/25/16 0555 09/25/16 2037 09/27/16 0435 09/29/16 0534  NA 136  --  137 137  K 3.8  --  3.7 4.2  CL 101  --  104 104  CO2 29  --  26 27  GLUCOSE 353* 550* 240* 230*  BUN <5*  --  <5* <5*  CREATININE 0.42*  --  0.39* 0.36*  CALCIUM 8.3*  --  8.6* 8.9  MG  --   --   --  1.6*   Liver Function Tests:  Recent Labs Lab 09/25/16 0555 09/27/16 0435 09/29/16 0534  AST 19 22 28   ALT 12* 14 22  ALKPHOS 70 74 88  BILITOT 0.5 0.2* 0.5  PROT 5.9* 6.1* 6.1*  ALBUMIN 3.0* 3.1* 3.1*   No results for input(s): LIPASE, AMYLASE in the last 168 hours. No results for input(s): AMMONIA in the last 168 hours. Coagulation Profile: No results for input(s): INR, PROTIME in the last 168 hours. CBC:  Recent Labs Lab 09/25/16 0555 09/27/16 0435 09/29/16 0534  WBC 6.7 7.9 7.1  NEUTROABS 2.7  --   --   HGB 10.2* 10.1* 11.3*  HCT 31.0* 30.6* 34.3*  MCV 88.8 88.2 88.6  PLT 402* 343 291   Cardiac Enzymes: No results for input(s): CKTOTAL, CKMB, CKMBINDEX, TROPONINI in the last 168 hours. BNP: Invalid input(s): POCBNP CBG:  Recent Labs Lab 09/30/16 0347 09/30/16 0703 09/30/16 0757 09/30/16 1143 09/30/16 1629  GLUCAP 222* 49* 80 160* 208*   HbA1C: No results for input(s): HGBA1C in the last 72 hours. Urine analysis:    Component Value Date/Time   COLORURINE YELLOW 09/20/2016 1428   APPEARANCEUR HAZY (A) 09/20/2016 1428   LABSPEC 1.030 09/20/2016 1428   PHURINE 8.0 09/20/2016 1428   GLUCOSEU 150 (A) 09/20/2016 1428   HGBUR NEGATIVE 09/20/2016 1428   BILIRUBINUR NEGATIVE 09/20/2016 1428   KETONESUR 80 (A) 09/20/2016 1428   PROTEINUR 30 (A) 09/20/2016 1428   UROBILINOGEN 0.2 05/06/2015 0045   NITRITE NEGATIVE 09/20/2016 1428   LEUKOCYTESUR NEGATIVE 09/20/2016 1428   Sepsis Labs: @LABRCNTIP (procalcitonin:4,lacticidven:4) )No results found  for this or any previous visit (from the past 240 hour(s)).   Scheduled Meds: . cholecalciferol  1,000 Units Oral Daily  . enoxaparin (LOVENOX) injection  40 mg Subcutaneous Q24H  . famotidine  20 mg Oral BID  . ferrous sulfate  325 mg Oral Q breakfast  . gabapentin  100 mg Oral TID  . gabapentin  200 mg Oral QHS  . HYDROmorphone   Intravenous Q4H  . insulin aspart  0-9 Units Subcutaneous Q4H  . insulin glargine  8 Units Subcutaneous QHS  . lipase/protease/amylase  36,000 Units Oral TID WC  . metoCLOPramide (REGLAN) injection  5 mg Intravenous Q6H  . sodium chloride flush       Continuous Infusions: . dextrose 5 % and 0.45% NaCl 100 mL/hr at 09/30/16 1630  . feeding supplement (GLUCERNA 1.2 CAL)      Procedures/Studies: Dg Abd 1 View  Result Date: 09/30/2016 CLINICAL DATA:  Feeding tube placement EXAM: ABDOMEN - 1 VIEW COMPARISON:  Earlier imaging of 09/30/2016 FINDINGS: Feeding tube is coiled in the  stomach with the tip projecting over the gastric antrum. Tube has not passed across the pylorus into the duodenum. Visualized bowel gas pattern normal. Pancreatic stent noted. Lung bases clear. IMPRESSION: Feeding tube coiled in the stomach with the tip projecting over the gastric antrum. Electronically Signed   By: Lavonia Dana M.D.   On: 09/30/2016 16:29   Ct Abdomen Pelvis W Contrast  Result Date: 09/20/2016 CLINICAL DATA:  PMHx of DM on insulin, DKA, GERD, and IBS, who presents to the Emergency Department for an evaluation of hyperglycemia that began 2 days ago. Pt reports she noticed her blood sugar "spiked" yesterday with associated left-sided abdominal pain, nausea, vomiting. History of DKA. EXAM: CT ABDOMEN AND PELVIS WITH CONTRAST TECHNIQUE: Multidetector CT imaging of the abdomen and pelvis was performed using the standard protocol following bolus administration of intravenous contrast. CONTRAST:  57mL ISOVUE-300 IOPAMIDOL (ISOVUE-300) INJECTION 61%, 163mL ISOVUE-300 IOPAMIDOL  (ISOVUE-300) INJECTION 61% COMPARISON:  08/20/2016, 08/19/2016 FINDINGS: Lower chest: No acute abnormality. Hepatobiliary: The liver is homogeneous. No focal liver lesion. Gallbladder is present and normal in CT appearance. Pancreas: Pancreatic duct stent has been placed since prior exams, decompressing the previously dilated pancreatic duct. No evidence for pancreatic mass. Spleen: Normal in size without focal abnormality. Left upper quadrant splenule is present in the region of the splenic hilum. Adrenals/Urinary Tract: Normal adrenal glands. Normal appearance of both kidneys. No hydronephrosis or ureteral abnormality. Stomach/Bowel: The stomach and small bowel loops are normal in appearance. Colonic loops are normal in appearance. Appendix is not seen. Vascular/Lymphatic: No significant vascular findings are present. No enlarged abdominal or pelvic lymph nodes. Reproductive: The uterus is present.  No adnexal mass. Other: No abdominal wall hernia or abnormality. No abdominopelvic ascites. Musculoskeletal: No acute or significant osseous findings. IMPRESSION: 1. Interval placement of pancreatic duct stent, decompressing the previously dilated pancreatic duct. 2. No bowel obstruction or evidence for abscess. Electronically Signed   By: Nolon Nations M.D.   On: 09/20/2016 16:25   Dg Fluoro Rm 1-60 Min  Addendum Date: 09/30/2016   ADDENDUM REPORT: 09/30/2016 12:30 ADDENDUM: After the patient returned to her room, she experienced an episode of vomiting and dislodged the feeding tube, which remained take in her nose at 79 cm though the bulk of the tube was now coiled in her throat. Patient return to fluoroscopy room and the catheter was repositioned with the tip again placed at the pylorus. Unable to pass the catheter into the duodenum. Tube was again taped at 79 cm. Patient instructed to lying on RIGHT side and will perform abdominal radiographs in 4 hours to assess tube position. Additional fluoroscopy time 2  minutes 42 seconds. Dose:  55.8  mGy Electronically Signed   By: Lavonia Dana M.D.   On: 09/30/2016 12:30   Result Date: 09/30/2016 CLINICAL DATA:  Dislodged feeding tube EXAM: FLOURO RM 1-60 MIN CONTRAST:  None FLUOROSCOPY TIME:  Fluoroscopy Time:  6 minutes 12 seconds Radiation Exposure Index (if provided by the fluoroscopic device): 35.4 mGy Number of Acquired Spot Images: 1 fluoroscopic screen capture COMPARISON:  09/29/2016 FINDINGS: Initial fluoroscopy demonstrated that the tip of the feeding tube placed on 09/29/2016 has been dislodged and has been retracted all the way to the nasopharynx. Using an .035 guidewire, the feeding tube was advanced into the stomach. The tip of the feeding tube was placed at the pylorus. Despite multiple manipulations with the guidewire and RIGHT lateral decubitus positioning, the tip of the tube could not be placed across the  pylorus into the duodenum. Following guidewire removal, the tube was left in place at this site and the patient was instructed to lie on her RIGHT side for the next 4 hours in an attempt to get it to pass across the pylorus. Abdominal radiograph ordered for 1500 hours today. Tube was secured with the 79 cm mark at the tip of the nose. IMPRESSION: Placement of feeding tube with tip at the pylorus as above. Electronically Signed: By: Lavonia Dana M.D. On: 09/30/2016 11:13   Dg Fluoro Rm 1-60 Min  Result Date: 09/29/2016 CLINICAL DATA:  Chronic pancreatitis.  Weight loss. EXAM: FLOURO RM 1-60 MIN CONTRAST:  Isovue-300 FLUOROSCOPY TIME:  Fluoroscopy Time:  4 minutes 54 seconds Radiation Exposure Index (if provided by the fluoroscopic device): 14.5 mGy Number of Acquired Spot Images: 0 COMPARISON:  Abdominal CT 09/20/2016 FINDINGS: A weighted tip feeding tube was easily advanced through the right nostril to the pylorus. The duodenal bulb was easily cannulated, but the tube could not be advanced through the duodenal C-loop. Contrast was injected and very there  was definite stasis in the proximal duodenum. No proximal duodenal distention to suggest a chronic stricture. The patient was left on her right side for 10 minutes, but the tube did not progressed during that time. Material from most recent meal was seen peristalsing in the moderately distended stomach. At 5 minutes of fluoroscopy, the decision was made to rest the bowel and allow for gravity advancement. Abundance slack was left in the stomach and the tube was positioned at the bulb/pylorus. The patient was instructed to lay on her right side as much as possible. IMPRESSION: Weighted tip feeding tube was easily advanced to duodenal bulb, but could not be advanced into the distal duodenum. Injected contrast showed definite stasis in the proximal duodenum and food from most recent meal was also retained in the stomach. The tube tip was positioned at the bulb with adequate slack to allow for progression. The tube may need additional manipulation, suggest KUB tomorrow. Electronically Signed   By: Monte Fantasia M.D.   On: 09/29/2016 15:44    Jhostin Epps, DO  [facility] Pager [phone]  If 7PM-7AM, please contact night-coverage [email or web address] Password [email or web address] 09/30/2016, 6:13 PM   LOS: 10 days

## 2016-09-30 NOTE — Progress Notes (Signed)
  Subjective:  Patient complains of nausea. She states epigastric pain is about the same.   Objective: Blood pressure 118/86, pulse 66, temperature 97.8 F (36.6 C), temperature source Oral, resp. rate 18, height 5\' 2"  (1.575 m), weight 110 lb (49.9 kg), last menstrual period 09/06/2016, SpO2 93 %. Patient is alert and in no acute distress.  Feeding tube is in place. Most of the tube appears to be outside her body. I was able to flush it with water but she became nauseated.  Labs/studies Results:   Recent Labs  09/29/16 0534  WBC 7.1  HGB 11.3*  HCT 34.3*  PLT 291    BMET   Recent Labs  09/29/16 0534  NA 137  K 4.2  CL 104  CO2 27  GLUCOSE 230*  BUN <5*  CREATININE 0.36*  CALCIUM 8.9    LFT   Recent Labs  09/29/16 0534  PROT 6.1*  ALBUMIN 3.1*  AST 28  ALT 22  ALKPHOS 88  BILITOT 0.5     Assessment:  #1.Malfunctioning feeding tube. Feeding tube was only advanced to duodenum yesterday and stopped working after about 2 hours. Tube appears to be either esophagus or stomach. Will ask Dr. [place] Papas to advance the tube into jejunum if possible.  #2. Acute on chronic pancreatitis. Patient remains with constant pain and frequent need for pain medication. She only has been able to tolerate full liquids. Patient has pancreatic stent which was placed about a month ago. Dr. Delrae Alfred her gastroenterologist at  [facility] recommended nasojejunal feeding until the time she can have stent removal or exchange.  #3.Diabetes mellitus secondary to chronic pancreatitis  #4. 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 He has very little if any pancreas remaining.  Recommendations:  Will ask Dr. Thornton Papas to advance feeding tube into jejunum if possible. Once tube is in correct position and she is tolerating feeding she could be transitioned to oral pain medications.

## 2016-09-30 NOTE — Care Management Note (Signed)
Case Management Note  Patient Details  Name: Linda Walls MRN: [id number] Date of Birth: 10-Mar-1991  Expected Discharge Date:      10/03/2016            Expected Discharge Plan:  [place]  In-House Referral:  NA  Discharge planning Services  CM Consult  Post Acute Care Choice:  Home Health Choice offered to:  Patient  HH Arranged:  RN [facility] Agency:  [place]  Status of Service:  In process, will continue to follow  If discussed at Long Length of Stay Meetings, dates discussed:  09/30/2016  Additional Comments: Pt will need tube feeding at DC. Pt has chosen [facility] from list of TF providers. Romualdo Bolk, of [facility], made aware. CM will cont to follow.  Sherald Barge, RN 09/30/2016, 2:04 PM

## 2016-10-01 ENCOUNTER — Encounter (HOSPITAL_COMMUNITY): Payer: Self-pay | Admitting: *Deleted

## 2016-10-01 ENCOUNTER — Inpatient Hospital Stay (HOSPITAL_COMMUNITY): Payer: BC Managed Care – PPO

## 2016-10-01 ENCOUNTER — Encounter (HOSPITAL_COMMUNITY)
Admission: EM | Disposition: A | Discharge status: Other Acute Inpatient Hospital | Payer: Self-pay | Source: Home / Self Care | Attending: Internal Medicine

## 2016-10-01 ENCOUNTER — Inpatient Hospital Stay (HOSPITAL_COMMUNITY): Payer: BC Managed Care – PPO | Admitting: Anesthesiology

## 2016-10-01 HISTORY — PX: ESOPHAGOGASTRODUODENOSCOPY (EGD) WITH PROPOFOL: SHX5813

## 2016-10-01 LAB — COMPREHENSIVE METABOLIC PANEL
ALT: 21 U/L (ref 14–54)
ANION GAP: 7 (ref 5–15)
AST: 26 U/L (ref 15–41)
Albumin: 3.6 g/dL (ref 3.5–5.0)
Alkaline Phosphatase: 116 U/L (ref 38–126)
BILIRUBIN TOTAL: 0.6 mg/dL (ref 0.3–1.2)
BUN: 5 mg/dL — ABNORMAL LOW (ref 6–20)
CALCIUM: 9.3 mg/dL (ref 8.9–10.3)
CO2: 28 mmol/L (ref 22–32)
Chloride: 99 mmol/L — ABNORMAL LOW (ref 101–111)
Creatinine, Ser: 0.43 mg/dL — ABNORMAL LOW (ref 0.44–1.00)
Glucose, Bld: 207 mg/dL — ABNORMAL HIGH (ref 65–99)
Potassium: 3.7 mmol/L (ref 3.5–5.1)
Sodium: 134 mmol/L — ABNORMAL LOW (ref 135–145)
TOTAL PROTEIN: 7 g/dL (ref 6.5–8.1)

## 2016-10-01 LAB — CBC
HCT: 35.9 % — ABNORMAL LOW (ref 36.0–46.0)
HEMOGLOBIN: 11.8 g/dL — AB (ref 12.0–15.0)
MCH: 29.2 pg (ref 26.0–34.0)
MCHC: 32.9 g/dL (ref 30.0–36.0)
MCV: 88.9 fL (ref 78.0–100.0)
PLATELETS: 276 10*3/uL (ref 150–400)
RBC: 4.04 MIL/uL (ref 3.87–5.11)
RDW: 14.2 % (ref 11.5–15.5)
WBC: 7.9 10*3/uL (ref 4.0–10.5)

## 2016-10-01 LAB — GLUCOSE, CAPILLARY
GLUCOSE-CAPILLARY: 85 mg/dL (ref 65–99)
GLUCOSE-CAPILLARY: 89 mg/dL (ref 65–99)
GLUCOSE-CAPILLARY: 105 mg/dL — AB (ref 65–99)
GLUCOSE-CAPILLARY: 246 mg/dL — AB (ref 65–99)
Glucose-Capillary: 310 mg/dL — ABNORMAL HIGH (ref 65–99)
Glucose-Capillary: 84 mg/dL (ref 65–99)
Glucose-Capillary: 103 mg/dL — ABNORMAL HIGH (ref 65–99)

## 2016-10-01 SURGERY — ESOPHAGOGASTRODUODENOSCOPY (EGD) WITH PROPOFOL
Anesthesia: Monitor Anesthesia Care

## 2016-10-01 MED ORDER — MIDAZOLAM HCL 2 MG/2ML IJ SOLN
1.0000 mg | INTRAMUSCULAR | Status: DC
  Administered 2016-10-01: 2 mg via INTRAVENOUS

## 2016-10-01 MED ORDER — MIDAZOLAM HCL 5 MG/5ML IJ SOLN
INTRAMUSCULAR | Status: DC | PRN
  Administered 2016-10-01: 2 mg via INTRAVENOUS

## 2016-10-01 MED ORDER — ENOXAPARIN SODIUM 40 MG/0.4ML ~~LOC~~ SOLN
40.0000 mg | SUBCUTANEOUS | Status: DC
  Administered 2016-10-02 – 2016-10-03 (×2): 40 mg via SUBCUTANEOUS
  Filled 2016-10-01 (×2): qty 0.4

## 2016-10-01 MED ORDER — NALOXONE HCL 0.4 MG/ML IJ SOLN: 0.4000 mg | INTRAMUSCULAR | Status: DC | PRN

## 2016-10-01 MED ORDER — SODIUM CHLORIDE 0.9% FLUSH: 9.0000 mL | INTRAVENOUS | Status: DC | PRN

## 2016-10-01 MED ORDER — HYDROMORPHONE 1 MG/ML IV SOLN
INTRAVENOUS | Status: DC
  Administered 2016-10-02: 11:00:00 via INTRAVENOUS
  Administered 2016-10-02: 1.5 mg via INTRAVENOUS
  Administered 2016-10-02: 8.7 mg via INTRAVENOUS
  Filled 2016-10-01: qty 25

## 2016-10-01 MED ORDER — METOCLOPRAMIDE HCL 5 MG/ML IJ SOLN
10.0000 mg | Freq: Four times a day (QID) | INTRAMUSCULAR | Status: AC
  Administered 2016-10-01 – 2016-10-02 (×4): 10 mg via INTRAVENOUS
  Filled 2016-10-01 (×4): qty 2

## 2016-10-01 MED ORDER — FENTANYL CITRATE (PF) 100 MCG/2ML IJ SOLN
25.0000 ug | INTRAMUSCULAR | Status: AC
  Administered 2016-10-01 (×2): 25 ug via INTRAVENOUS

## 2016-10-01 MED ORDER — DIPHENHYDRAMINE HCL 50 MG/ML IJ SOLN: 12.5000 mg | Freq: Four times a day (QID) | INTRAMUSCULAR | Status: DC | PRN

## 2016-10-01 MED ORDER — GLUCERNA 1.2 CAL PO LIQD
1000.0000 mL | ORAL | Status: DC
  Administered 2016-10-01: 1000 mL
  Filled 2016-10-01 (×2): qty 1000

## 2016-10-01 MED ORDER — DIPHENHYDRAMINE HCL 12.5 MG/5ML PO ELIX: 12.5000 mg | ORAL_SOLUTION | Freq: Four times a day (QID) | ORAL | Status: DC | PRN

## 2016-10-01 MED ORDER — FENTANYL CITRATE (PF) 100 MCG/2ML IJ SOLN
INTRAMUSCULAR | Status: AC
  Filled 2016-10-01: qty 2

## 2016-10-01 MED ORDER — SODIUM CHLORIDE 0.9 % IV SOLN: INTRAVENOUS | Status: DC

## 2016-10-01 MED ORDER — ONDANSETRON HCL 4 MG/2ML IJ SOLN
4.0000 mg | Freq: Four times a day (QID) | INTRAMUSCULAR | Status: DC | PRN
  Administered 2016-10-02: 4 mg via INTRAVENOUS
  Filled 2016-10-01: qty 2

## 2016-10-01 MED ORDER — MIDAZOLAM HCL 2 MG/2ML IJ SOLN
INTRAMUSCULAR | Status: AC
  Filled 2016-10-01: qty 2

## 2016-10-01 MED ORDER — SODIUM CHLORIDE 0.9 % IV BOLUS (SEPSIS)
500.0000 mL | Freq: Once | INTRAVENOUS | Status: AC
  Administered 2016-10-01: 500 mL via INTRAVENOUS

## 2016-10-01 MED ORDER — PROPOFOL 10 MG/ML IV BOLUS
INTRAVENOUS | Status: DC | PRN
  Administered 2016-10-01 (×2): 10 mg via INTRAVENOUS

## 2016-10-01 MED ORDER — PROPOFOL 500 MG/50ML IV EMUL
INTRAVENOUS | Status: DC | PRN
  Administered 2016-10-01: 125 ug/kg/min via INTRAVENOUS
  Administered 2016-10-01: 75 ug/kg/min via INTRAVENOUS

## 2016-10-01 MED ORDER — LACTATED RINGERS IV SOLN
INTRAVENOUS | Status: DC
  Administered 2016-10-01: 13:00:00 via INTRAVENOUS

## 2016-10-01 NOTE — Progress Notes (Signed)
Pt positioned on Rt side per Dr [name] verbal order

## 2016-10-01 NOTE — Addendum Note (Signed)
Addendum  created 10/01/16 1435 by Vista Deck, CRNA   Charge Capture section accepted, Visit diagnoses modified

## 2016-10-01 NOTE — Transfer of Care (Signed)
Immediate Anesthesia Transfer of Care Note  Patient: Linda Walls  Procedure(s) Performed: Procedure(s): ESOPHAGOGASTRODUODENOSCOPY (EGD) WITH PROPOFOL With feeding tube placement. (N/A)  Patient Location: PACU  Anesthesia Type:MAC  Level of Consciousness: sedated and patient cooperative  Airway & Oxygen Therapy: Patient Spontanous Breathing and non-rebreather face mask  Post-op Assessment: Report given to RN and Post -op Vital signs reviewed and stable  Post vital signs: Reviewed and stable  Last Vitals:  Vitals:   10/01/16 1305 10/01/16 1311  BP: 105/67 109/66  Pulse:    Resp: 18 13  Temp:      Last Pain:  Vitals:   10/01/16 1315  TempSrc:   PainSc: 7       Patients Stated Pain Goal: 7 (38/88/28 0034)  Complications: No apparent anesthesia complications

## 2016-10-01 NOTE — Anesthesia Preprocedure Evaluation (Signed)
Anesthesia Evaluation  Patient identified by MRN, date of birth, ID band Patient awake    Reviewed: Allergy & Precautions, NPO status , Patient's Chart, lab work & pertinent test results  Airway Mallampati: I  TM Distance: >3 FB     Dental  (+) Teeth Intact   Pulmonary neg pulmonary ROS,    breath sounds clear to auscultation       Cardiovascular negative cardio ROS   Rhythm:Regular Rate:Normal     Neuro/Psych  Headaches,    GI/Hepatic GERD  Medicated,Chronic hereditary pancreatitis   Endo/Other  diabetes, Type 1, Insulin Dependent  Renal/GU      Musculoskeletal   Abdominal   Peds  Hematology   Anesthesia Other Findings   Reproductive/Obstetrics                             Anesthesia Physical Anesthesia Plan  ASA: III  Anesthesia Plan: MAC   Post-op Pain Management:    Induction: Intravenous  Airway Management Planned: Simple Face Mask  Additional Equipment:   Intra-op Plan:   Post-operative Plan:   Informed Consent: I have reviewed the patients History and Physical, chart, labs and discussed the procedure including the risks, benefits and alternatives for the proposed anesthesia with the patient or authorized representative who has indicated his/her understanding and acceptance.     Plan Discussed with:   Anesthesia Plan Comments:         Anesthesia Quick Evaluation

## 2016-10-01 NOTE — Progress Notes (Signed)
Feeding tube stopped working last night. Plan film shows tube tip to be in the antrum. It is kinked in proximal stomach. Options reviewed with patient and her mother which include attempting again under fluoroscopy or proceeding with endoscopic placement. Patient would like to proceed with endoscopic placement of nasojejunal tube. Will do so later today under monitored anesthesia care.

## 2016-10-01 NOTE — Progress Notes (Addendum)
Inpatient Diabetes Program Recommendations  AACE/ADA: New Consensus Statement on Inpatient Glycemic Control (2015)  Target Ranges:  Prepandial:   less than 140 mg/dL      Peak postprandial:   less than 180 mg/dL (1-2 hours)      Critically ill patients:  140 - 180 mg/dL   Results for SARELY, STRACENER (MRN [id number]) as of 10/01/2016 08:47  Ref. Range 09/30/2016 03:47 09/30/2016 07:03 09/30/2016 07:57 09/30/2016 11:43 09/30/2016 16:29 09/30/2016 20:27 09/30/2016 23:55 10/01/2016 04:40 10/01/2016 07:30  Glucose-Capillary Latest Ref Range: 65 - 99 mg/dL 222 (H)  Novolog 3 units 49 (L) 80 160 (H) 208 (H)  Novolog 3 units 150 (H) 283 (H)  Lantus 8 units  310 (H)  Novolog 7units 84  Results for WENDY, HOBACK (MRN [id number]) as of 10/01/2016 08:47  Ref. Range 09/29/2016 01:49 09/29/2016 07:53 09/29/2016 11:58 09/29/2016 16:37 09/29/2016 20:19 09/29/2016 21:06 09/29/2016 23:56  Glucose-Capillary Latest Ref Range: 65 - 99 mg/dL 270 (H)  Lantus 15 units 150 (H)  Novolog 1 unit 313 (H)  Novolog 7 units 246 (H)  Novolog 3 units 59 (L) 89 150 (H)  Lantus 15 units   Review of Glycemic Control Diabetes history: DM1 (makes no insulin and sensitive to insulin) Outpatient Diabetes medications:Tresiba 30 units QHS, Novolog 12 units TID with meals Current orders for Inpatient glycemic control: Lantus 8 units QHS, Novolog 0-9 units Q4H  Inpatient Diabetes Program Recommendations: Insulin - Basal: Note Lantus was decreased from 15 units to 8 units on 09/30/16. Hypoglycemia patient has experierenced most likely from too much correction insulin as patient has Type 1 DM, is very sensitive to insulin, and has poor intake. Glucose is up to 310 mg/dl at 4:40 am today. Recommend increasing Lantus back up to 15 units QHS. Correction (SSI): Patient has experienced several episodes of hypoglycemia since being admitted.  In reviewing glucose trends, it appears that hypoglycemia typically occurs after gettIng Novolog correction. Noted  Novolog correction was NOT GIVEN at 8pm and 12 midnight last night as both were charted as patient refused. May need to discontinue current Novolog correction scale and order a custom scale (where 1 unit drops glucose about 50 mg/dl). Insulin - Tube Feeding Coverage: If patient is tolerating tube feedings, may want to consider ordering Novolog 2 units Q4H for tube feeding coverage. Tube feeding coverage should not be given if tube feeding is on hold or stopped.  Thanks, Barnie Alderman, RN, MSN, CDE Diabetes Coordinator Inpatient Diabetes Program [phone] (Team Pager from 8am to 5pm)

## 2016-10-01 NOTE — Op Note (Signed)
[facility] Patient Name: Linda Walls Procedure Date: 10/01/2016 12:57 PM MRN: [id number] Date of Birth: 1990/10/29 Attending MD: Hildred Laser , MD CSN: [id number] Age: 26 Admit Type: Outpatient Procedure:                Upper GI endoscopy Indications:              Therapeutic procedure, , Pancreatitis to place                            nasoenteral feeding tube Providers:                Hildred Laser, MD, Otis Peak B. Sharon Seller, RN, Zoila Shutter, Technologist Referring MD:             Orson Eva, MD Medicines:                Lidocaine spray, Propofol per Anesthesia Complications:            No immediate complications. Estimated Blood Loss:     Estimated blood loss was minimal due to duodenal                            mucosal injury. Procedure:                Pre-Anesthesia Assessment:                           - Prior to the procedure, a History and Physical                            was performed, and patient medications and                            allergies were reviewed. The patient's tolerance of                            previous anesthesia was also reviewed. The risks                            and benefits of the procedure and the sedation                            options and risks were discussed with the patient.                            All questions were answered, and informed consent                            was obtained. Prior Anticoagulants: The patient                            last took Lovenox (enoxaparin) 1 day prior to the  procedure. ASA Grade Assessment: III - A patient                            with severe systemic disease. After reviewing the                            risks and benefits, the patient was deemed in                            satisfactory condition to undergo the procedure.                           After obtaining informed consent, the endoscope was   passed under direct vision. Throughout the                            procedure, the patient's blood pressure, pulse, and                            oxygen saturations were monitored continuously. The                            EC-[id number] ([id number]) scope was introduced through                            the mouth, and advanced to the third part of                            duodenum. The upper GI endoscopy was technically                            difficult and complex. The patient tolerated the                            procedure well. Scope In: 1:26:17 PM Scope Out: 2:05:24 PM Total Procedure Duration: 0 hours 39 minutes 7 seconds  Findings:      The examined esophagus was normal.      The Z-line was regular.      The entire examined stomach was normal.      The duodenal bulb was normal.      A previously placed plastic pancreatic stent was seen in the second       portion of the duodenum.      10 French nasoantral feeding tube was advanced through the right nares       into the stomach; was difficult grab it with snare. It was caught with       raptor and advanced into third part of duodenum.      A 10 Fr nasogastric tube was placed through the nares into the       esophagus. Under endoscopic guidance, the tube was advanced into the       duodenum. Placement was confirmed by scope visualization. Impression:               - Feeding tube placed into third part of duodenum.                           -  Normal esophagus.                           - Z-line regular.                           - Normal stomach.                           - Normal duodenal bulb.                           - Plastic pancreatic stent in the duodenum.                           - No specimens collected.                           - A nasogastric was placed into the duodenum. Moderate Sedation:      Per Anesthesia Care Recommendation:           - Return patient to hospital ward for ongoing care.                            - NPO.                           - Continue present medications.                           - begin feeding today.                           - KUB in am. Procedure Code(s):        --- Professional ---                           [phone], Esophagogastroduodenoscopy, flexible,                            transoral; with insertion of intraluminal tube or                            catheter Diagnosis Code(s):        --- Professional ---                           K85.90, Acute pancreatitis without necrosis or                            infection, unspecified CPT copyright 2016 American Medical Association. All rights reserved. The codes documented in this report are preliminary and upon coder review may  be revised to meet current compliance requirements. Hildred Laser, MD Hildred Laser, MD 10/01/2016 3:29:00 PM This report has been signed electronically. Number of Addenda: 0

## 2016-10-01 NOTE — Anesthesia Postprocedure Evaluation (Signed)
Anesthesia Post Note  Patient: Linda Walls  Procedure(s) Performed: Procedure(s) (LRB): ESOPHAGOGASTRODUODENOSCOPY (EGD) WITH PROPOFOL With feeding tube placement. (N/A)  Patient location during evaluation: PACU Anesthesia Type: MAC Level of consciousness: awake and alert Pain management: satisfactory to patient Vital Signs Assessment: post-procedure vital signs reviewed and stable Respiratory status: spontaneous breathing Cardiovascular status: stable Anesthetic complications: no     Last Vitals:  Vitals:   10/01/16 1311 10/01/16 1415  BP: 109/66   Pulse:    Resp: 13   Temp:  36.9 C    Last Pain:  Vitals:   10/01/16 1415  TempSrc:   PainSc: 0-No pain                 Jaidy Cottam

## 2016-10-01 NOTE — Progress Notes (Signed)
Nutrition Follow-up     INTERVENTION:   Recommend: Once NJ is placed and verified (below ligament of Treitz) : Initiate Glucerna 1.2 @ 25 ml/hr via Nasojejunal tube and increase by 10 ml every 6 hours to goal rate of 60 ml/hr.   Tube feeding regimen provides 1728 kcal (~100% of needs), 72 grams of protein, and 966 ml of H2O.    Recommend with resumption of tube feeding: Monitor magnesium, potassium, and phosphorus daily for at least 3 days, MD to replete as needed, as pt is at risk for refeeding syndrome given her prolonged hypocaloric intake and inflammatory condition.  Suggest daily weight monitoring with resumption of enteral feeding    NUTRITION DIAGNOSIS:   Inadequate oral intake related to altered GI function (acute on chronic pancreatitis) as evidenced by NPO status (Pt unable to tolerate diet advancement NJ tube being placed by IR).   GOAL:   Patient will meet greater than or equal to 90% of their needs   MONITOR:   TF tolerance, Weight trends, Labs  REASON FOR ASSESSMENT:   Consult Enteral/tube feeding initiation and management  ASSESSMENT:  4/2-Patient having difficulty tolerating full diet advancement. Her weight has trended down over the past few months which is likely associated with her  increased protein and energy needs related to inflammation and limited tolerance or oral intake. Usual wt 52-54 kg.   4/4-The patient is sitting up in bed on her computer this morning. No vomiting so far today still c/o nausea. She has only received a few hours of tube feeding due to problems with her feeding tube.  Patient had Nasojejunal tube placed but is kinked per MD. Family has decided to have placement via endoscopy. This is planned for later today. RD will continue to follow.  She is at risk for severe acute malnutrition given her poor tolerance of nutrition intake in the setting of acute pancreatitis. Nursing to obtain current weight to assess whether she meets criteria.   Recent Labs Lab 09/27/16 0435 09/29/16 0534 10/01/16 0614  NA 137 137 134*  K 3.7 4.2 3.7  CL 104 104 99*  CO2 26 27 28   BUN <5* <5* 5*  CREATININE 0.39* 0.36* 0.43*  CALCIUM 8.6* 8.9 9.3  MG  --  1.6*  --   GLUCOSE 240* 230* 207*   Labs and Meds reviewed.   Diet Order:  Diet NPO time specified Except for: Sips with Meds  Skin:  Reviewed, no issues  Last BM:  09/28/16  Height:   Ht Readings from Last 1 Encounters:  09/20/16 5\' 2"  (1.575 m)    Weight:   Wt Readings from Last 1 Encounters:  09/20/16 110 lb (49.9 kg)  NO new weight to assess change.  Ideal Body Weight:  50 kg  BMI:  Body mass index is 20.12 kg/m.  Estimated Nutritional Needs:   Kcal:  1500-1750 (30-35 kcal/kg)  Protein:  75 gr (1.5 gr/kg bw)  Fluid:  1.5-1.8 liters daily  EDUCATION NEEDS:   No education needs identified at this time Colman Cater MS,RD,CSG,LDN Office: #[phone] Pager: [phone]

## 2016-10-01 NOTE — Progress Notes (Signed)
PROGRESS NOTE  Linda Walls NLG:[id number] DOB: 02/18/1991 DOA: 09/20/2016 PCP: [name], MD  Brief History: 26 y.o.femalewith medical history significant for hereditary pancreatitis with pancreatic insufficiency and secondary diabetes mellitus, GERD, and migraines who presents to the emergency department with abdominal pain, nausea, and nonbloody vomiting. 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 The patient was recently transferred to [facility] on 08/25/16. At [facility], the patient was admitted there from 08/25/2016 through 09/13/2016. During that admission, the patient had an ERCP and pancreatic stent placement on 08/28/2016. The patient had significant difficulty with advancement of her diet and required NG enteral feedings for a brief period of time. At the time of this admission, CT of the abdomen and pelvis was negative for any acute findings. The patient was placed on bowel rest initially, and her diet was gradually advanced. Unfortunately, the patient continues to have significant pain despite full liquids. After discussion with the patient's gastroenterologist at [facility], a nasojejunal tube was placed and the patient was started on enteral feedings. Due to uncontrolled pain, the patient was changed over to a Dilaudid PCA pump.  Assessment/Plan: Acute on chronic pancreatitis -appreciate GI follow up. Discussed with Dr. [name] 4/2 -09/25/16--vomiting after eating soft diet for breakfast, then increase abd pain -down gradedto clear liquids 3/29 -advanced to full liquids 09/27/16 -continued having significant pain with full liquids -platelets trending down, suggesting clinical improvement but still significant pain -4/3 start enteral feeding--IR requested to plans nasojejunal tube 4/2 -4/4 patient underwent  EGD for enteral tube placement into duodenum. Started on tube feeds and kept npo -4/3 switch to IV hydromorphone PCA--pain uncontrolled with intermittent dosing, increased PCA to full dose -Continue Creon  Diabetes mellitus secondary to pancreatic insufficiency -continue Lantus 15 units--hypoglycemia due to NPO status before enteral feedings were started-->better since TF started -Continue NovoLog sliding scale--change to sensitive scale and q 4 hour checks since pt now on enteral feeding -3/29--HbA1c--9.3  Thrombocytosis -Likely acute phase reactant -improving  SVT -pt had short run <5 seconds am 3/29 -remains in sinus since then -defer starting meds for now  Disposition Plan: Not stable for discharge Family Communication: Mother updated at bedside 4/3   Consultants: GI  Code Status: FULL  DVT Prophylaxis: [place] Lovenox   Procedures: As Listed in Progress Note Above  Antibiotics: None     Subjective: Continues to have significant abdominal pain despite PCA. No vomiting. No bowel movement today. unerwent EGD today for NG tube placement in duodenum.  Objective: Vitals:   10/01/16 1445 10/01/16 1500 10/01/16 1515 10/01/16 1611  BP: 107/87 103/84 110/76   Pulse: 95 85 81   Resp: 11 (!) 7 (!) 8 18  Temp:      TempSrc:      SpO2: 99% 99% 97% 98%  Weight:      Height:        Intake/Output Summary (Last 24 hours) at 10/01/16 1731 Last data filed at 10/01/16 1406  Gross per 24 hour  Intake          2016.67 ml  Output             1000 ml  Net          1016.67 ml   Weight change:  Exam:   General:  Pt is alert, follows commands appropriately, not in acute  distress  HEENT: No icterus, No thrush, No neck mass, [place]/AT  Cardiovascular: RRR, S1/S2, no rubs, no gallops  Respiratory: CTA bilaterally, no wheezing, no crackles, no rhonchi  Abdomen: Soft/+BS,epigastric and RUQ tender, non distended, no guarding  Extremities: No edema, No  lymphangitis, No petechiae, No rashes, no synovitis   Data Reviewed: I have personally reviewed following labs and imaging studies Basic Metabolic Panel:  Recent Labs Lab 09/25/16 0555 09/25/16 2037 09/27/16 0435 09/29/16 0534 10/01/16 0614  NA 136  --  137 137 134*  K 3.8  --  3.7 4.2 3.7  CL 101  --  104 104 99*  CO2 29  --  26 27 28   GLUCOSE 353* 550* 240* 230* 207*  BUN <5*  --  <5* <5* 5*  CREATININE 0.42*  --  0.39* 0.36* 0.43*  CALCIUM 8.3*  --  8.6* 8.9 9.3  MG  --   --   --  1.6*  --    Liver Function Tests:  Recent Labs Lab 09/25/16 0555 09/27/16 0435 09/29/16 0534 10/01/16 0614  AST 19 22 28 26   ALT 12* 14 22 21   ALKPHOS 70 74 88 116  BILITOT 0.5 0.2* 0.5 0.6  PROT 5.9* 6.1* 6.1* 7.0  ALBUMIN 3.0* 3.1* 3.1* 3.6   No results for input(s): LIPASE, AMYLASE in the last 168 hours. No results for input(s): AMMONIA in the last 168 hours. Coagulation Profile: No results for input(s): INR, PROTIME in the last 168 hours. CBC:  Recent Labs Lab 09/25/16 0555 09/27/16 0435 09/29/16 0534 10/01/16 0614  WBC 6.7 7.9 7.1 7.9  NEUTROABS 2.7  --   --   --   HGB 10.2* 10.1* 11.3* 11.8*  HCT 31.0* 30.6* 34.3* 35.9*  MCV 88.8 88.2 88.6 88.9  PLT 402* 343 291 276   Cardiac Enzymes: No results for input(s): CKTOTAL, CKMB, CKMBINDEX, TROPONINI in the last 168 hours. BNP: Invalid input(s): POCBNP CBG:  Recent Labs Lab 10/01/16 0730 10/01/16 1125 10/01/16 1343 10/01/16 1429 10/01/16 1724  GLUCAP 84 103* 89 85 105*   HbA1C: No results for input(s): HGBA1C in the last 72 hours. Urine analysis:    Component Value Date/Time   COLORURINE YELLOW 09/20/2016 1428   APPEARANCEUR HAZY (A) 09/20/2016 1428   LABSPEC 1.030 09/20/2016 1428   PHURINE 8.0 09/20/2016 1428   GLUCOSEU 150 (A) 09/20/2016 1428   HGBUR NEGATIVE 09/20/2016 1428   BILIRUBINUR NEGATIVE 09/20/2016 1428   KETONESUR 80 (A) 09/20/2016 1428   PROTEINUR 30 (A) 09/20/2016 1428   UROBILINOGEN  0.2 05/06/2015 0045   NITRITE NEGATIVE 09/20/2016 1428   LEUKOCYTESUR NEGATIVE 09/20/2016 1428   Sepsis Labs: @LABRCNTIP (procalcitonin:4,lacticidven:4) )No results found for this or any previous visit (from the past 240 hour(s)).   Scheduled Meds: . cholecalciferol  1,000 Units Oral Daily  . [START ON 10/02/2016] enoxaparin (LOVENOX) injection  40 mg Subcutaneous Q24H  . famotidine  20 mg Oral BID  . ferrous sulfate  325 mg Oral Q breakfast  . gabapentin  100 mg Oral TID  . gabapentin  200 mg Oral QHS  . HYDROmorphone   Intravenous Q4H  . insulin aspart  0-9 Units Subcutaneous Q4H  . insulin glargine  8 Units Subcutaneous QHS  . lipase/protease/amylase  36,000 Units Oral TID WC   Continuous Infusions: . dextrose 5 % and 0.45% NaCl 100 mL/hr at 10/01/16 1653  . feeding supplement (GLUCERNA 1.2 CAL) 1,000 mL (10/01/16 1627)    Procedures/Studies: Dg Abd 1 View - Kub  Result Date: 10/01/2016 CLINICAL DATA:  Placement of nasal jejunal tube EXAM: ABDOMEN - 1 VIEW COMPARISON:  10/01/2016, CT 09/20/2016 FINDINGS: Visualized lung bases are clear. Esophageal tube tip overlies the second portion of the duodenum. Pancreas stent remains in place. Nonobstructed gas pattern with moderate stools in the colon. No abnormal calcification. IMPRESSION: Feeding tube tip overlies the second portion of the duodenum. Electronically Signed   By: Donavan Foil M.D.   On: 10/01/2016 15:00   Dg Abd 1 View  Result Date: 10/01/2016 CLINICAL DATA:  Assess feeding tube placement. EXAM: ABDOMEN - 1 VIEW COMPARISON:  KUB of September 30, 2016 FINDINGS: The radiodense tipped feeding tube still lies in the stomach with the tip in the pre-pyloric region. The pre attic duct stent is in stable position. The bowel gas pattern is within the limits of normal. The lung bases are clear. IMPRESSION: The feeding tube tip lies in the pre-pyloric region. Advancement is recommended prior to feeding. Electronically Signed   By: David  Martinique  M.D.   On: 10/01/2016 07:36   Dg Abd 1 View  Result Date: 09/30/2016 CLINICAL DATA:  Feeding tube placement EXAM: ABDOMEN - 1 VIEW COMPARISON:  Earlier imaging of 09/30/2016 FINDINGS: Feeding tube is coiled in the stomach with the tip projecting over the gastric antrum. Tube has not passed across the pylorus into the duodenum. Visualized bowel gas pattern normal. Pancreatic stent noted. Lung bases clear. IMPRESSION: Feeding tube coiled in the stomach with the tip projecting over the gastric antrum. Electronically Signed   By: Lavonia Dana M.D.   On: 09/30/2016 16:29   Ct Abdomen Pelvis W Contrast  Result Date: 09/20/2016 CLINICAL DATA:  PMHx of DM on insulin, DKA, GERD, and IBS, who presents to the Emergency Department for an evaluation of hyperglycemia that began 2 days ago. Pt reports she noticed her blood sugar "spiked" yesterday with associated left-sided abdominal pain, nausea, vomiting. History of DKA. EXAM: CT ABDOMEN AND PELVIS WITH CONTRAST TECHNIQUE: Multidetector CT imaging of the abdomen and pelvis was performed using the standard protocol following bolus administration of intravenous contrast. CONTRAST:  56mL ISOVUE-300 IOPAMIDOL (ISOVUE-300) INJECTION 61%, 148mL ISOVUE-300 IOPAMIDOL (ISOVUE-300) INJECTION 61% COMPARISON:  08/20/2016, 08/19/2016 FINDINGS: Lower chest: No acute abnormality. Hepatobiliary: The liver is homogeneous. No focal liver lesion. Gallbladder is present and normal in CT appearance. Pancreas: Pancreatic duct stent has been placed since prior exams, decompressing the previously dilated pancreatic duct. No evidence for pancreatic mass. Spleen: Normal in size without focal abnormality. Left upper quadrant splenule is present in the region of the splenic hilum. Adrenals/Urinary Tract: Normal adrenal glands. Normal appearance of both kidneys. No hydronephrosis or ureteral abnormality. Stomach/Bowel: The stomach and small bowel loops are normal in appearance. Colonic loops are  normal in appearance. Appendix is not seen. Vascular/Lymphatic: No significant vascular findings are present. No enlarged abdominal or pelvic lymph nodes. Reproductive: The uterus is present.  No adnexal mass. Other: No abdominal wall hernia or abnormality. No abdominopelvic ascites. Musculoskeletal: No acute or significant osseous findings. IMPRESSION: 1. Interval placement of pancreatic duct stent, decompressing the previously dilated pancreatic duct. 2. No bowel obstruction or evidence for abscess. Electronically Signed   By: Nolon Nations M.D.   On: 09/20/2016 16:25   Dg Fluoro Rm 1-60 Min  Addendum Date: 09/30/2016   ADDENDUM REPORT: 09/30/2016 12:30 ADDENDUM: After the patient returned to her room, she experienced an episode of vomiting and dislodged the feeding tube, which remained take in her nose at 79 cm  though the bulk of the tube was now coiled in her throat. Patient return to fluoroscopy room and the catheter was repositioned with the tip again placed at the pylorus. Unable to pass the catheter into the duodenum. Tube was again taped at 79 cm. Patient instructed to lying on RIGHT side and will perform abdominal radiographs in 4 hours to assess tube position. Additional fluoroscopy time 2 minutes 42 seconds. Dose:  55.8  mGy Electronically Signed   By: Lavonia Dana M.D.   On: 09/30/2016 12:30   Result Date: 09/30/2016 CLINICAL DATA:  Dislodged feeding tube EXAM: FLOURO RM 1-60 MIN CONTRAST:  None FLUOROSCOPY TIME:  Fluoroscopy Time:  6 minutes 12 seconds Radiation Exposure Index (if provided by the fluoroscopic device): 35.4 mGy Number of Acquired Spot Images: 1 fluoroscopic screen capture COMPARISON:  09/29/2016 FINDINGS: Initial fluoroscopy demonstrated that the tip of the feeding tube placed on 09/29/2016 has been dislodged and has been retracted all the way to the nasopharynx. Using an .035 guidewire, the feeding tube was advanced into the stomach. The tip of the feeding tube was placed at the  pylorus. Despite multiple manipulations with the guidewire and RIGHT lateral decubitus positioning, the tip of the tube could not be placed across the pylorus into the duodenum. Following guidewire removal, the tube was left in place at this site and the patient was instructed to lie on her RIGHT side for the next 4 hours in an attempt to get it to pass across the pylorus. Abdominal radiograph ordered for 1500 hours today. Tube was secured with the 79 cm mark at the tip of the nose. IMPRESSION: Placement of feeding tube with tip at the pylorus as above. Electronically Signed: By: Lavonia Dana M.D. On: 09/30/2016 11:13   Dg Fluoro Rm 1-60 Min  Result Date: 09/29/2016 CLINICAL DATA:  Chronic pancreatitis.  Weight loss. EXAM: FLOURO RM 1-60 MIN CONTRAST:  Isovue-300 FLUOROSCOPY TIME:  Fluoroscopy Time:  4 minutes 54 seconds Radiation Exposure Index (if provided by the fluoroscopic device): 14.5 mGy Number of Acquired Spot Images: 0 COMPARISON:  Abdominal CT 09/20/2016 FINDINGS: A weighted tip feeding tube was easily advanced through the right nostril to the pylorus. The duodenal bulb was easily cannulated, but the tube could not be advanced through the duodenal C-loop. Contrast was injected and very there was definite stasis in the proximal duodenum. No proximal duodenal distention to suggest a chronic stricture. The patient was left on her right side for 10 minutes, but the tube did not progressed during that time. Material from most recent meal was seen peristalsing in the moderately distended stomach. At 5 minutes of fluoroscopy, the decision was made to rest the bowel and allow for gravity advancement. Abundance slack was left in the stomach and the tube was positioned at the bulb/pylorus. The patient was instructed to lay on her right side as much as possible. IMPRESSION: Weighted tip feeding tube was easily advanced to duodenal bulb, but could not be advanced into the distal duodenum. Injected contrast showed  definite stasis in the proximal duodenum and food from most recent meal was also retained in the stomach. The tube tip was positioned at the bulb with adequate slack to allow for progression. The tube may need additional manipulation, suggest KUB tomorrow. Electronically Signed   By: Monte Fantasia M.D.   On: 09/29/2016 15:44    Arcelia Pals  [facility] Pager [phone]  If 7PM-7AM, please contact night-coverage [email or web address] Password [email or web address] 10/01/2016, 5:31 PM   LOS: 11  days

## 2016-10-01 NOTE — Progress Notes (Signed)
Pts heart rate sustaining in the 120-130s, Dr. On call made aware. Waiting for orders.

## 2016-10-01 NOTE — Progress Notes (Signed)
I was called earlier this evening that feeding tube was not working. 035 guidewire was passed through the tube at bedside and noted kink in the proximal segment which turned out to be due to due in hypopharynx. 2 was pulled and loop on done. Tube patency check by pushing air through the tube. Tube feeding was resumed.

## 2016-10-01 NOTE — Progress Notes (Signed)
After receiving 512ml NS bolus, HR decreased to 98. Will continue to monitor pt.

## 2016-10-02 ENCOUNTER — Inpatient Hospital Stay (HOSPITAL_COMMUNITY): Payer: BC Managed Care – PPO

## 2016-10-02 DIAGNOSIS — D72823 Leukemoid reaction: Secondary | ICD-10-CM

## 2016-10-02 LAB — BASIC METABOLIC PANEL
ANION GAP: 8 (ref 5–15)
BUN: 5 mg/dL — AB (ref 6–20)
CHLORIDE: 100 mmol/L — AB (ref 101–111)
CO2: 27 mmol/L (ref 22–32)
Calcium: 9.2 mg/dL (ref 8.9–10.3)
Creatinine, Ser: 0.39 mg/dL — ABNORMAL LOW (ref 0.44–1.00)
GFR calc Af Amer: >60 mL/min (ref >60)
Glucose, Bld: 233 mg/dL — ABNORMAL HIGH (ref 65–99)
POTASSIUM: 3.9 mmol/L (ref 3.5–5.1)
SODIUM: 135 mmol/L (ref 135–145)

## 2016-10-02 LAB — GLUCOSE, CAPILLARY
GLUCOSE-CAPILLARY: 193 mg/dL — AB (ref 65–99)
GLUCOSE-CAPILLARY: 228 mg/dL — AB (ref 65–99)
GLUCOSE-CAPILLARY: 244 mg/dL — AB (ref 65–99)
GLUCOSE-CAPILLARY: 254 mg/dL — AB (ref 65–99)
Glucose-Capillary: 160 mg/dL — ABNORMAL HIGH (ref 65–99)
Glucose-Capillary: 138 mg/dL — ABNORMAL HIGH (ref 65–99)

## 2016-10-02 MED ORDER — HYDROMORPHONE HCL 1 MG/ML IJ SOLN
1.0000 mg | INTRAMUSCULAR | Status: DC | PRN
  Administered 2016-10-02 – 2016-10-04 (×13): 1 mg via INTRAVENOUS
  Filled 2016-10-02 (×13): qty 1

## 2016-10-02 MED ORDER — OXYCODONE HCL 5 MG PO TABS
10.0000 mg | ORAL_TABLET | Freq: Four times a day (QID) | ORAL | Status: DC
  Administered 2016-10-02 – 2016-10-03 (×6): 10 mg via ORAL
  Filled 2016-10-02 (×6): qty 2

## 2016-10-02 MED ORDER — METOCLOPRAMIDE HCL 5 MG/ML IJ SOLN
10.0000 mg | Freq: Four times a day (QID) | INTRAMUSCULAR | Status: AC
  Administered 2016-10-02 – 2016-10-03 (×4): 10 mg via INTRAVENOUS
  Filled 2016-10-02 (×4): qty 2

## 2016-10-02 MED ORDER — GLUCERNA 1.2 CAL PO LIQD
1000.0000 mL | ORAL | Status: DC
  Filled 2016-10-02 (×2): qty 1000

## 2016-10-02 MED ORDER — GLUCERNA 1.2 CAL PO LIQD
1000.0000 mL | ORAL | Status: DC
  Administered 2016-10-02 (×2): 1000 mL
  Filled 2016-10-02 (×6): qty 1000

## 2016-10-02 NOTE — Progress Notes (Signed)
Jevity TF increased to 43ml/hr per Dr. Dorien Chihuahua. Will continue to monitor pt tolerance.

## 2016-10-02 NOTE — Anesthesia Postprocedure Evaluation (Signed)
Anesthesia Post Note  Patient: Linda Walls  Procedure(s) Performed: Procedure(s) (LRB): ESOPHAGOGASTRODUODENOSCOPY (EGD) WITH PROPOFOL With feeding tube placement. (N/A)  Patient location during evaluation: Nursing Unit Anesthesia Type: MAC Level of consciousness: awake and alert, oriented and patient cooperative Pain management: pain level controlled Vital Signs Assessment: post-procedure vital signs reviewed and stable Respiratory status: spontaneous breathing, nonlabored ventilation and respiratory function stable Cardiovascular status: blood pressure returned to baseline Postop Assessment: no signs of nausea or vomiting Anesthetic complications: no     Last Vitals:  Vitals:   10/02/16 0400 10/02/16 0800  BP: 100/72 99/76  Pulse: 80 86  Resp: (!) 8 11  Temp: 36.8 C 36.9 C    Last Pain:  Vitals:   10/02/16 0800  TempSrc: Oral  PainSc: 7                  Dontasia Miranda J

## 2016-10-02 NOTE — Progress Notes (Signed)
PROGRESS NOTE  Linda Walls UVO:[id number] DOB: [birth year] DOA: 09/20/2016 PCP: [name], MD  Brief History: 26 y.o.femalewith medical history significant for hereditary pancreatitis with pancreatic insufficiency and secondary diabetes mellitus, GERD, and migraines who presents to the emergency department with abdominal pain, nausea, and nonbloody vomiting. 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 The patient was recently transferred to [facility] on 08/25/16. At [facility], the patient was admitted there from 08/25/2016 through 09/13/2016. During that admission, the patient had an ERCP and pancreatic stent placement on 08/28/2016. The patient had significant difficulty with advancement of her diet and required NG enteral feedings for a brief period of time. At the time of this admission, CT of the abdomen and pelvis was negative for any acute findings. The patient was placed on bowel rest initially, and her diet was gradually advanced. Unfortunately, the patient continues to have significant pain despite full liquids. After discussion with the patient's gastroenterologist at [facility], a nasojejunal tube was placed and the patient was started on enteral feedings. Due to uncontrolled pain, the patient was changed over to a Dilaudid PCA pump.  Assessment/Plan: Acute on chronic pancreatitis -appreciate GI follow up. Discussed with Dr. [name] 4/2 -09/25/16--vomiting after eating soft diet for breakfast, then increase abd pain -down gradedto clear liquids 3/29 -advanced to full liquids 09/27/16 -continued having significant pain with full liquids -platelets trending down, suggesting clinical improvement but still significant pain -4/3 start enteral feeding--IR requested to plans nasojejunal tube 4/2 -4/4 patient underwent  EGD for enteral tube placement into duodenum. Started on tube feeds and kept npo -4/3 switch to IV hydromorphone PCA--pain uncontrolled with intermittent dosing, increased PCA to full dose - 4/5 patient reports pain is uncontrolled on pca pump. Will transition to scheduled oxycodone with dilaudid for breakthrough. Hopefully can transition to regimen that she can take at home. -Continue Creon  Diabetes mellitus secondary to pancreatic insufficiency -continue Lantus 15 units--hypoglycemia due to NPO status before enteral feedings were started-->better since TF started -Continue NovoLog sliding scale--change to sensitive scale and q 4 hour checks since pt now on enteral feeding -3/29--HbA1c--9.3  Thrombocytosis -Likely acute phase reactant -improving  SVT -pt had short run <5 seconds am 3/29 -remains in sinus since then -defer starting meds for now  Disposition Plan: Not stable for discharge Family Communication: Mother updated at bedside 4/3   Consultants: GI  Code Status: FULL  DVT Prophylaxis: [place] Lovenox   Procedures: As Listed in Progress Note Above  Antibiotics: None     Subjective: Pain is uncontrolled on PCA pump. Tolerating tube feeding  Objective: Vitals:   10/02/16 0800 10/02/16 0934 10/02/16 1033 10/02/16 1600  BP: 99/76   112/63  Pulse: 86   82  Resp: 11  10 12   Temp: 98.4 F (36.9 C)   98.5 F (36.9 C)  TempSrc: Oral   Oral  SpO2: 100% 99% 99% 100%  Weight:      Height:        Intake/Output Summary (Last 24 hours) at 10/02/16 1700 Last data filed at 10/02/16 1300  Gross per 24 hour  Intake          3730.83 ml  Output                0 ml  Net  3730.83 ml   Weight change:  Exam:   General:  Pt is alert, follows commands appropriately, not in acute distress  HEENT: No icterus, No thrush, No neck mass, [place]/AT  Cardiovascular: RRR, S1/S2, no rubs, no gallops  Respiratory: CTA bilaterally, no wheezing, no  crackles, no rhonchi  Abdomen: Soft/+BS,epigastric and RUQ tender, non distended, no guarding  Extremities: No edema, No lymphangitis, No petechiae, No rashes, no synovitis   Data Reviewed: I have personally reviewed following labs and imaging studies Basic Metabolic Panel:  Recent Labs Lab 09/25/16 2037 09/27/16 0435 09/29/16 0534 10/01/16 0614 10/02/16 0557  NA  --  137 137 134* 135  K  --  3.7 4.2 3.7 3.9  CL  --  104 104 99* 100*  CO2  --  26 27 28 27   GLUCOSE 550* 240* 230* 207* 233*  BUN  --  <5* <5* 5* 5*  CREATININE  --  0.39* 0.36* 0.43* 0.39*  CALCIUM  --  8.6* 8.9 9.3 9.2  MG  --   --  1.6*  --   --    Liver Function Tests:  Recent Labs Lab 09/27/16 0435 09/29/16 0534 10/01/16 0614  AST 22 28 26   ALT 14 22 21   ALKPHOS 74 88 116  BILITOT 0.2* 0.5 0.6  PROT 6.1* 6.1* 7.0  ALBUMIN 3.1* 3.1* 3.6   No results for input(s): LIPASE, AMYLASE in the last 168 hours. No results for input(s): AMMONIA in the last 168 hours. Coagulation Profile: No results for input(s): INR, PROTIME in the last 168 hours. CBC:  Recent Labs Lab 09/27/16 0435 09/29/16 0534 10/01/16 0614  WBC 7.9 7.1 7.9  HGB 10.1* 11.3* 11.8*  HCT 30.6* 34.3* 35.9*  MCV 88.2 88.6 88.9  PLT 343 291 276   Cardiac Enzymes: No results for input(s): CKTOTAL, CKMB, CKMBINDEX, TROPONINI in the last 168 hours. BNP: Invalid input(s): POCBNP CBG:  Recent Labs Lab 10/02/16 0015 10/02/16 0413 10/02/16 0737 10/02/16 1115 10/02/16 1615  GLUCAP 193* 160* 244* 228* 254*   HbA1C: No results for input(s): HGBA1C in the last 72 hours. Urine analysis:    Component Value Date/Time   COLORURINE YELLOW 09/20/2016 1428   APPEARANCEUR HAZY (A) 09/20/2016 1428   LABSPEC 1.030 09/20/2016 1428   PHURINE 8.0 09/20/2016 1428   GLUCOSEU 150 (A) 09/20/2016 1428   HGBUR NEGATIVE 09/20/2016 1428   BILIRUBINUR NEGATIVE 09/20/2016 1428   KETONESUR 80 (A) 09/20/2016 1428   PROTEINUR 30 (A) 09/20/2016 1428    UROBILINOGEN 0.2 05/06/2015 0045   NITRITE NEGATIVE 09/20/2016 1428   LEUKOCYTESUR NEGATIVE 09/20/2016 1428   Sepsis Labs: @LABRCNTIP (procalcitonin:4,lacticidven:4) )No results found for this or any previous visit (from the past 240 hour(s)).   Scheduled Meds: . cholecalciferol  1,000 Units Oral Daily  . enoxaparin (LOVENOX) injection  40 mg Subcutaneous Q24H  . famotidine  20 mg Oral BID  . ferrous sulfate  325 mg Oral Q breakfast  . gabapentin  100 mg Oral TID  . gabapentin  200 mg Oral QHS  . insulin aspart  0-9 Units Subcutaneous Q4H  . insulin glargine  8 Units Subcutaneous QHS  . lipase/protease/amylase  36,000 Units Oral TID WC  . metoCLOPramide (REGLAN) injection  10 mg Intravenous Q6H  . oxyCODONE  10 mg Oral Q6H   Continuous Infusions: . dextrose 5 % and 0.45% NaCl 50 mL/hr at 10/02/16 1655  . feeding supplement (GLUCERNA 1.2 CAL) 1,000 mL (10/02/16 0930)    Procedures/Studies: Dg Abd 1 View  Result Date: 10/02/2016 CLINICAL DATA:  The assessed feeding tube position EXAM: ABDOMEN - 1 VIEW COMPARISON:  Portable chest x-ray of October 01, 2016 at 1445 hours. FINDINGS: The radiodense tipped feeding tube tip lies in the distal second portion of the duodenum. The pancreatic duct stent is in stable position. The bowel gas pattern is normal. IMPRESSION: There has been slight further distal migration of the feeding tube such that the tip is in the distal second portion of the duodenum. Electronically Signed   By: David  Martinique M.D.   On: 10/02/2016 07:35   Dg Abd 1 View - Kub  Result Date: 10/01/2016 CLINICAL DATA:  Placement of nasal jejunal tube EXAM: ABDOMEN - 1 VIEW COMPARISON:  10/01/2016, CT 09/20/2016 FINDINGS: Visualized lung bases are clear. Esophageal tube tip overlies the second portion of the duodenum. Pancreas stent remains in place. Nonobstructed gas pattern with moderate stools in the colon. No abnormal calcification. IMPRESSION: Feeding tube tip overlies the second  portion of the duodenum. Electronically Signed   By: Donavan Foil M.D.   On: 10/01/2016 15:00   Dg Abd 1 View  Result Date: 10/01/2016 CLINICAL DATA:  Assess feeding tube placement. EXAM: ABDOMEN - 1 VIEW COMPARISON:  KUB of September 30, 2016 FINDINGS: The radiodense tipped feeding tube still lies in the stomach with the tip in the pre-pyloric region. The pre attic duct stent is in stable position. The bowel gas pattern is within the limits of normal. The lung bases are clear. IMPRESSION: The feeding tube tip lies in the pre-pyloric region. Advancement is recommended prior to feeding. Electronically Signed   By: David  Martinique M.D.   On: 10/01/2016 07:36   Dg Abd 1 View  Result Date: 09/30/2016 CLINICAL DATA:  Feeding tube placement EXAM: ABDOMEN - 1 VIEW COMPARISON:  Earlier imaging of 09/30/2016 FINDINGS: Feeding tube is coiled in the stomach with the tip projecting over the gastric antrum. Tube has not passed across the pylorus into the duodenum. Visualized bowel gas pattern normal. Pancreatic stent noted. Lung bases clear. IMPRESSION: Feeding tube coiled in the stomach with the tip projecting over the gastric antrum. Electronically Signed   By: Lavonia Dana M.D.   On: 09/30/2016 16:29   Ct Abdomen Pelvis W Contrast  Result Date: 09/20/2016 CLINICAL DATA:  PMHx of DM on insulin, DKA, GERD, and IBS, who presents to the Emergency Department for an evaluation of hyperglycemia that began 2 days ago. Pt reports she noticed her blood sugar "spiked" yesterday with associated left-sided abdominal pain, nausea, vomiting. History of DKA. EXAM: CT ABDOMEN AND PELVIS WITH CONTRAST TECHNIQUE: Multidetector CT imaging of the abdomen and pelvis was performed using the standard protocol following bolus administration of intravenous contrast. CONTRAST:  32mL ISOVUE-300 IOPAMIDOL (ISOVUE-300) INJECTION 61%, 16mL ISOVUE-300 IOPAMIDOL (ISOVUE-300) INJECTION 61% COMPARISON:  08/20/2016, 08/19/2016 FINDINGS: Lower chest: No  acute abnormality. Hepatobiliary: The liver is homogeneous. No focal liver lesion. Gallbladder is present and normal in CT appearance. Pancreas: Pancreatic duct stent has been placed since prior exams, decompressing the previously dilated pancreatic duct. No evidence for pancreatic mass. Spleen: Normal in size without focal abnormality. Left upper quadrant splenule is present in the region of the splenic hilum. Adrenals/Urinary Tract: Normal adrenal glands. Normal appearance of both kidneys. No hydronephrosis or ureteral abnormality. Stomach/Bowel: The stomach and small bowel loops are normal in appearance. Colonic loops are normal in appearance. Appendix is not seen. Vascular/Lymphatic: No significant vascular findings are present. No enlarged abdominal or pelvic lymph nodes. Reproductive:  The uterus is present.  No adnexal mass. Other: No abdominal wall hernia or abnormality. No abdominopelvic ascites. Musculoskeletal: No acute or significant osseous findings. IMPRESSION: 1. Interval placement of pancreatic duct stent, decompressing the previously dilated pancreatic duct. 2. No bowel obstruction or evidence for abscess. Electronically Signed   By: Nolon Nations M.D.   On: 09/20/2016 16:25   Dg Fluoro Rm 1-60 Min  Addendum Date: 09/30/2016   ADDENDUM REPORT: 09/30/2016 12:30 ADDENDUM: After the patient returned to her room, she experienced an episode of vomiting and dislodged the feeding tube, which remained take in her nose at 79 cm though the bulk of the tube was now coiled in her throat. Patient return to fluoroscopy room and the catheter was repositioned with the tip again placed at the pylorus. Unable to pass the catheter into the duodenum. Tube was again taped at 79 cm. Patient instructed to lying on RIGHT side and will perform abdominal radiographs in 4 hours to assess tube position. Additional fluoroscopy time 2 minutes 42 seconds. Dose:  55.8  mGy Electronically Signed   By: Lavonia Dana M.D.   On:  09/30/2016 12:30   Result Date: 09/30/2016 CLINICAL DATA:  Dislodged feeding tube EXAM: FLOURO RM 1-60 MIN CONTRAST:  None FLUOROSCOPY TIME:  Fluoroscopy Time:  6 minutes 12 seconds Radiation Exposure Index (if provided by the fluoroscopic device): 35.4 mGy Number of Acquired Spot Images: 1 fluoroscopic screen capture COMPARISON:  09/29/2016 FINDINGS: Initial fluoroscopy demonstrated that the tip of the feeding tube placed on 09/29/2016 has been dislodged and has been retracted all the way to the nasopharynx. Using an .035 guidewire, the feeding tube was advanced into the stomach. The tip of the feeding tube was placed at the pylorus. Despite multiple manipulations with the guidewire and RIGHT lateral decubitus positioning, the tip of the tube could not be placed across the pylorus into the duodenum. Following guidewire removal, the tube was left in place at this site and the patient was instructed to lie on her RIGHT side for the next 4 hours in an attempt to get it to pass across the pylorus. Abdominal radiograph ordered for 1500 hours today. Tube was secured with the 79 cm mark at the tip of the nose. IMPRESSION: Placement of feeding tube with tip at the pylorus as above. Electronically Signed: By: Lavonia Dana M.D. On: 09/30/2016 11:13   Dg Fluoro Rm 1-60 Min  Result Date: 09/29/2016 CLINICAL DATA:  Chronic pancreatitis.  Weight loss. EXAM: FLOURO RM 1-60 MIN CONTRAST:  Isovue-300 FLUOROSCOPY TIME:  Fluoroscopy Time:  4 minutes 54 seconds Radiation Exposure Index (if provided by the fluoroscopic device): 14.5 mGy Number of Acquired Spot Images: 0 COMPARISON:  Abdominal CT 09/20/2016 FINDINGS: A weighted tip feeding tube was easily advanced through the right nostril to the pylorus. The duodenal bulb was easily cannulated, but the tube could not be advanced through the duodenal C-loop. Contrast was injected and very there was definite stasis in the proximal duodenum. No proximal duodenal distention to suggest a  chronic stricture. The patient was left on her right side for 10 minutes, but the tube did not progressed during that time. Material from most recent meal was seen peristalsing in the moderately distended stomach. At 5 minutes of fluoroscopy, the decision was made to rest the bowel and allow for gravity advancement. Abundance slack was left in the stomach and the tube was positioned at the bulb/pylorus. The patient was instructed to lay on her right side as much  as possible. IMPRESSION: Weighted tip feeding tube was easily advanced to duodenal bulb, but could not be advanced into the distal duodenum. Injected contrast showed definite stasis in the proximal duodenum and food from most recent meal was also retained in the stomach. The tube tip was positioned at the bulb with adequate slack to allow for progression. The tube may need additional manipulation, suggest KUB tomorrow. Electronically Signed   By: Monte Fantasia M.D.   On: 09/29/2016 15:44    Keion Neels  [facility] Pager [phone]  If 7PM-7AM, please contact night-coverage [email or web address] Password [email or web address] 10/02/2016, 5:00 PM   LOS: 12 days

## 2016-10-02 NOTE — Addendum Note (Signed)
Addendum  created 10/02/16 1019 by Charmaine Downs, CRNA   Sign clinical note

## 2016-10-02 NOTE — Progress Notes (Signed)
Inpatient Diabetes Program Recommendations  AACE/ADA: New Consensus Statement on Inpatient Glycemic Control (2015)  Target Ranges:  Prepandial:   less than 140 mg/dL      Peak postprandial:   less than 180 mg/dL (1-2 hours)      Critically ill patients:  140 - 180 mg/dL   Results for DAYNA, ALIA (MRN [id number]) as of 10/02/2016 08:52  Ref. Range 10/01/2016 04:40 10/01/2016 07:30 10/01/2016 11:25 10/01/2016 13:43 10/01/2016 14:29 10/01/2016 17:24 10/01/2016 19:48 10/02/2016 00:15 10/02/2016 04:13 10/02/2016 07:37  Glucose-Capillary Latest Ref Range: 65 - 99 mg/dL 310 (H)  Novolog 7 units 84 103 (H) 89 85 105 (H) 246 (H)  Novolog 3 units  Lantus 8 units 193 (H)  Novolog 2 units 160 (H)  Novolog 2 units 244 (H)  Novolog 3 units   Review of Glycemic Control  Review of Glycemic Control Diabetes history:DM1 (makes no insulin and sensitive to insulin) Outpatient Diabetes medications:Tresiba 30 units QHS, Novolog 12 units TID with meals Current orders for Inpatient glycemic control: Lantus 8 units QHS, Novolog 0-9units Q4H  Inpatient Diabetes Program Recommendations: Insulin - Basal: Note Lantus was decreased from 15 units to 8 units on 09/30/16. Now that tube feedings have been resumed and glucose is trending higher, recommend increasing Lantus back up to 15 units QHS. Insulin - Tube Feeding Coverage: If patient is tolerating tube feedings, may want to consider ordering Novolog 2 units Q4H for tube feeding coverage. Tube feeding insulin coverage should not be given if tube feeding is on hold or stopped.  Thanks, Barnie Alderman, RN, MSN, CDE Diabetes Coordinator Inpatient Diabetes Program [phone] (Team Pager from 8am to 5pm)

## 2016-10-02 NOTE — Progress Notes (Signed)
Nutrition Follow-up     INTERVENTION:    NJ sucessfully placed:  Glucerna 1.2 has advanced now to 50 ml/hr.  Home Regimen: When pt is cleared for discharge she will transition to 12 hr nocturnal feeding regimen.  Glucerna 1.2 @ 130 ml/hr from 2100-0900 daily.  Free water- additional 700 mls daily plus 30 ml before starting feeding and before clamping daily.  Nutrition provided: 1872 kcal, 93 gr protein and 1255 ml (water from formula) plus 700 free water is 1955 ml daily. [100% of est needs met ]  Patient may need also to adjust her insulin regimen -recommend confer with diabetes coordinators.    Recommend with resumption of tube feeding: Monitor magnesium, potassium, and phosphorus daily for at least 3 days, MD to replete as needed, as pt is at risk for refeeding syndrome given her prolonged hypocaloric intake and inflammatory condition.   NUTRITION DIAGNOSIS:   Inadequate oral intake related to altered GI function (acute on chronic pancreatitis) as evidenced by NPO status (Pt unable to tolerate diet advancement NJ tube being placed by IR).   GOAL:   Patient will meet greater than or equal to 90% of their needs   MONITOR:   TF tolerance, Weight trends, Labs  REASON FOR ASSESSMENT:   Consult Enteral/tube feeding initiation and management  ASSESSMENT:  4/2-Patient having difficulty tolerating full diet advancement. Her weight has trended down over the past few months which is likely associated with her  increased protein and energy needs related to inflammation and limited tolerance or oral intake. Usual wt 52-54 kg.   4/4-The patient is sitting up in bed on her computer this morning. No vomiting so far today still c/o nausea. She has only received a few hours of tube feeding due to problems with her feeding tube.  Patient had Nasojejunal tube placed but is kinked per MD. Family has decided to have placement via endoscopy. This is planned for later today. RD will continue  to follow.  She is at risk for severe acute malnutrition given her poor tolerance of nutrition intake in the setting of acute pancreatitis. Nursing to obtain current weight to assess whether she meets criteria.   4/5- Pt has tube feeding successfully advanced this morning Glucerna 1.2 @ 50 ml/hr. Talked with pt about her daily activity level and the timeframe she would prefer to receive her nutrition. 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 Her mom is retired and able to provide excellent support and assist with managing the enteral feeding. The pt has managed TPN at home before and feels very comfortable with the enteral feeds. Patient expecting to be on feeding short term and is followed closely by Dr [name]. Based on pt feedback recommend 12 hr nocturnal nutrition delivery. She will likely also need her insulin regimen re-assessed.    Recent Labs Lab 09/29/16 0534 10/01/16 0614 10/02/16 0557  NA 137 134* 135  K 4.2 3.7 3.9  CL 104 99* 100*  CO2 _0 BUN <5* 5* 5*  CREATININE 0.36* 0.43* 0.39*  CALCIUM 8.9 9.3 9.2  MG 1.6*  --   --   GLUCOSE 230* 207* 233*   Labs and Meds reviewed.   Diet Order:  Diet NPO time specified  Skin:  Reviewed, no issues  Last BM:  09/28/16  Height:   Ht Readings from Last 1 Encounters:  10/01/16 _1  (1.575 m)    Weight:   Wt Readings from Last 1 Encounters:  10/01/16  110 lb (49.9 kg)  10/02/16 -48.8 kg (standing wt)   Ideal Body Weight:  50 kg  BMI:  Body mass index is 20.12 kg/m.  Re-Estimated Nutritional Needs (based on standing wt of 48.8 kg):   Kcal:  1710-1952  Protein:  78-87 gr   Fluid:  1.7-1.9 liters daily  EDUCATION NEEDS: discussed pt desires related to when would be optimal time for her to receive enteral feeds and the importance of compliance with regimen to optimize intake and nutrition repletion.   Colman Cater MS,RD,CSG,LDN Office: [phone] Pager: [phone]

## 2016-10-02 NOTE — Progress Notes (Signed)
  Subjective:  Denies vomiting. She has had intermittent nausea. She says pain control not to her satisfaction. No BM today.  Objective: Blood pressure 112/63, pulse 82, temperature 98.5 F (36.9 C), temperature source Oral, resp. rate 12, height 5\' 2"  (1.575 m), weight 110 lb (49.9 kg), last menstrual period 09/06/2016, SpO2 100 %. Patient is alert and sitting upright in bed. Abdomen is symmetrical and soft with mild to moderate midepigastric tenderness. No guarding or rebound noted.  No LE edema or clubbing noted.  Labs/studies Results:   Recent Labs  10/01/16 0614  WBC 7.9  HGB 11.8*  HCT 35.9*  PLT 276    BMET   Recent Labs  10/01/16 0614 10/02/16 0557  NA 134* 135  K 3.7 3.9  CL 99* 100*  CO2 28 27  GLUCOSE 207* 233*  BUN 5* 5*  CREATININE 0.43* 0.39*  CALCIUM 9.3 9.2    LFT   Recent Labs  10/01/16 0614  PROT 7.0  ALBUMIN 3.6  AST 26  ALT 21  ALKPHOS 116  BILITOT 0.6    KUB; Nasoenteral feeding tube tip is at junction of second and third part of the duodenum.   Assessment:  #1. Acute on chronic pancreatitis. Patient had pancreatic stent placed for high-grade pancreatic ductal stricture at [facility] on 08/28/2016. It does not appear that she is having less pain. She is on PCA with hydromorphone. Dr. Roderic Palau managing her pain medications. #2. Enteral feeding. Feeding tube could not be advanced into jejunum. Feeding tube is at function of second and third part of duodenum. She is tolerating feeding without diarrhea or vomiting. Discussed with Dr. Lavonia Dana. If tube tip does not migrate distally he will try to advance tube fluoroscopically tomorrow. Ms.Weisner's recommendations noted.   Recommendations:   Increase Glucerna rate to 60 ML per hour. Decrease IV fluid rate 250 mL per hour. Continue metoclopramide for another 24 hours or 4 doses. KUB in am.

## 2016-10-02 NOTE — Care Management Note (Signed)
Case Management Note  Patient Details  Name: Linda Walls MRN: [id number] Date of Birth: Oct 17, 1990  If discussed at Long Length of Stay Meetings, dates discussed:  10/02/2016   Sherald Barge, RN 10/02/2016, 11:12 AM

## 2016-10-03 ENCOUNTER — Inpatient Hospital Stay (HOSPITAL_COMMUNITY): Payer: BC Managed Care – PPO

## 2016-10-03 ENCOUNTER — Encounter (HOSPITAL_COMMUNITY): Payer: Self-pay | Admitting: Internal Medicine

## 2016-10-03 LAB — GLUCOSE, CAPILLARY
GLUCOSE-CAPILLARY: 433 mg/dL — AB (ref 65–99)
GLUCOSE-CAPILLARY: 392 mg/dL — AB (ref 65–99)
GLUCOSE-CAPILLARY: 100 mg/dL — AB (ref 65–99)
Glucose-Capillary: 161 mg/dL — ABNORMAL HIGH (ref 65–99)
Glucose-Capillary: 173 mg/dL — ABNORMAL HIGH (ref 65–99)
Glucose-Capillary: 198 mg/dL — ABNORMAL HIGH (ref 65–99)

## 2016-10-03 MED ORDER — BUTAMBEN-TETRACAINE-BENZOCAINE 2-2-14 % EX AERO
INHALATION_SPRAY | CUTANEOUS | Status: AC
  Filled 2016-10-03: qty 20

## 2016-10-03 MED ORDER — SODIUM CHLORIDE 0.9 % IV SOLN
INTRAVENOUS | Status: DC
  Administered 2016-10-03: 12:00:00 via INTRAVENOUS

## 2016-10-03 MED ORDER — GLUCERNA 1.2 CAL PO LIQD
1000.0000 mL | ORAL | Status: DC
  Administered 2016-10-03: 1000 mL
  Filled 2016-10-03 (×2): qty 1000

## 2016-10-03 MED ORDER — PROMETHAZINE HCL 25 MG/ML IJ SOLN: 12.5000 mg | Freq: Four times a day (QID) | INTRAMUSCULAR | 0 refills | Status: DC | PRN

## 2016-10-03 MED ORDER — ENSURE ENLIVE PO LIQD
237.0000 mL | Freq: Three times a day (TID) | ORAL | Status: DC
  Administered 2016-10-03 (×2): 237 mL via ORAL

## 2016-10-03 MED ORDER — HYDROMORPHONE HCL 1 MG/ML IJ SOLN: 1.0000 mg | INTRAMUSCULAR | 0 refills | Status: DC | PRN

## 2016-10-03 MED ORDER — ADULT MULTIVITAMIN W/MINERALS CH
1.0000 | ORAL_TABLET | Freq: Every day | ORAL | Status: DC
  Administered 2016-10-03: 1 via ORAL
  Filled 2016-10-03: qty 1

## 2016-10-03 MED ORDER — PRO-STAT SUGAR FREE PO LIQD
30.0000 mL | Freq: Two times a day (BID) | ORAL | Status: DC
  Administered 2016-10-03 (×2): 30 mL via ORAL
  Filled 2016-10-03 (×2): qty 30

## 2016-10-03 MED ORDER — INSULIN ASPART 100 UNIT/ML ~~LOC~~ SOLN
9.0000 [IU] | Freq: Once | SUBCUTANEOUS | Status: AC
  Administered 2016-10-03: 9 [IU] via SUBCUTANEOUS

## 2016-10-03 MED ORDER — SODIUM CHLORIDE 0.9% FLUSH
INTRAVENOUS | Status: AC
  Filled 2016-10-03: qty 10

## 2016-10-03 NOTE — Discharge Summary (Signed)
Physician Discharge Summary  Linda Walls DPO:[id number] DOB: 30-Jun-1991 DOA: 09/20/2016  PCP: [name], MD  Admit date: 09/20/2016 Discharge date: 10/03/2016  Admitted From: home Disposition:  Transfer to [facility]   Recommendations for Outpatient Follow-up:  1. Transfer to [facility] for further care  Home Health: Equipment/Devices:  Discharge Condition:stable CODE STATUS: full code Diet recommendation: NPO  Brief/Interim Summary: 26 y.o.femalewith medical history significant for hereditary pancreatitis with pancreatic insufficiency and secondary diabetes mellitus, GERD, and migraines who presents to the emergency department with abdominal pain, nausea, and nonbloody vomiting. 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 The patient was recently transferred to [facility] on 08/25/16. At [facility], the patient was admitted there from 08/25/2016 through 09/13/2016. During that admission, the patient had an ERCP and pancreatic stent placement on 08/28/2016. The patient had significant difficulty with advancement of her diet and required NG enteral feedings for a brief period of time. At the time of this admission, CT of the abdomen and pelvis was negative for any acute findings. The patient was placed on bowel rest initially, and her diet was gradually advanced. Unfortunately, the patient continued to have significant pain despite full liquids. After discussion with the patient's gastroenterologist at [facility], attempt at placement of nasojejunal tube was made so that she may be started on enteral feedings. This was done at bedside and via fluoroscopy several times. Unfortunately, either tube position was not correct or the tube would come out. Patient also underwent tube placement by EGD, but  unfortunately, her tube came out overnight. Currently, patient has NG tube placed in pylorus via fluoroscopy with the hopes that it will advance into the duodenum. Ultimately, she may need PEG/PEJ tube.  Her pain has been very difficult to control. She was initially started on IV narcotics without adequate control. This was advanced to PCA, but patient reported continued pain. She has been been transitioned to oxycodone with dilaudid for breakthrough. Per discussion with Dr. [name], there was concern that her recently placed pancreatic duct stent may be contributing to her pain and may need to be removed. Dr. [name] has discussed her case with Dr. Delrae Alfred, her GI at [facility] who has agreed to accept the patient in transfer. Dr. [name] also discussed with hospitalist at [facility] Dr Lizabeth Leyden who has accepted the patient. She will be transferred once a bed is available.  Discharge Diagnoses:  Principal Problem:   Acute on chronic pancreatitis [facility]) Active Problems:   Thrombocytosis (HCC)   Nausea and vomiting   Leukocytosis   Uncontrolled diabetes mellitus secondary to pancreatic insufficiency (HCC)   Hyperglycemia    Discharge Instructions   Allergies as of 10/03/2016      Reactions   Shellfish Allergy Nausea And Vomiting   Cefzil [cefprozil] Rash   Penicillins Rash   Has patient had a PCN reaction causing immediate rash, facial/tongue/throat swelling, SOB or lightheadedness with hypotension: NO Has patient had a PCN reaction causing severe rash involving mucus membranes or skin necrosis: NO Has patient had a PCN reaction that required hospitalization: NO Has patient had a PCN reaction occurring within the last 10 years: NO If all of the above answers are "NO", then may proceed with Cephalosporin use.   Sulfa Antibiotics Rash      Medication List    TAKE these medications   CREON 24000-76000 units Cpep Generic drug:  Pancrelipase (Lip-Prot-Amyl) Take two capsules with meals and one with  snacks.   FERROUSUL 325 (65 FE) MG tablet Generic drug:  ferrous sulfate Take 325 mg by mouth daily with breakfast.   gabapentin 100 MG capsule Commonly known as:  NEURONTIN Take 100mg  by mouth three times a day and 200mg  (2 caps) by mouth at bedtime   HYDROmorphone 1 MG/ML injection Commonly known as:  DILAUDID Inject 1 mL (1 mg total) into the vein every 2 (two) hours as needed for severe pain.   hyoscyamine 0.125 MG tablet Commonly known as:  LEVSIN, ANASPAZ Take 0.125 mg by mouth every 4 (four) hours as needed for bladder spasms or cramping.   insulin aspart 100 UNIT/ML injection Commonly known as:  novoLOG Inject 12 Units into the skin 3 (three) times daily before meals. Use sliding scale if sugar is running high.   omeprazole 20 MG capsule Commonly known as:  PRILOSEC Take 20 mg by mouth daily.   oxyCODONE-acetaminophen 10-325 MG tablet Commonly known as:  PERCOCET Take 1 tablet by mouth every 4 (four) hours as needed.   promethazine 25 MG/ML injection Commonly known as:  PHENERGAN Inject 0.5 mLs (12.5 mg total) into the vein every 6 (six) hours as needed.   SUMAtriptan 50 MG tablet Commonly known as:  IMITREX Take 50 mg by mouth every 2 (two) hours as needed for migraine.   TRESIBA FLEXTOUCH 100 UNIT/ML Sopn FlexTouch Pen Generic drug:  insulin degludec Inject 30 Units into the skin daily at 10 pm.   vitamin C 500 MG tablet Commonly known as:  ASCORBIC ACID Take 500 mg by mouth.   VITAMIN D-1000 MAX ST 1000 units tablet Generic drug:  Cholecalciferol Take 1,000 Units by mouth daily.       Allergies  Allergen Reactions  . Shellfish Allergy Nausea And Vomiting  . Cefzil [Cefprozil] Rash  . Penicillins Rash    Has patient had a PCN reaction causing immediate rash, facial/tongue/throat swelling, SOB or lightheadedness with hypotension: NO Has patient had a PCN reaction causing severe rash involving mucus membranes or skin necrosis: NO Has patient had a  PCN reaction that required hospitalization: NO Has patient had a PCN reaction occurring within the last 10 years: NO If all of the above answers are "NO", then may proceed with Cephalosporin use.   . Sulfa Antibiotics Rash    Consultations:  gastroenterology   Procedures/Studies: Dg Abd 1 View  Result Date: 10/02/2016 CLINICAL DATA:  The assessed feeding tube position EXAM: ABDOMEN - 1 VIEW COMPARISON:  Portable chest x-ray of October 01, 2016 at 1445 hours. FINDINGS: The radiodense tipped feeding tube tip lies in the distal second portion of the duodenum. The pancreatic duct stent is in stable position. The bowel gas pattern is normal. IMPRESSION: There has been slight further distal migration of the feeding tube such that the tip is in the distal second portion of the duodenum. Electronically Signed   By: David  Martinique M.D.   On: 10/02/2016 07:35   Dg Abd 1 View - Kub  Result Date: 10/01/2016 CLINICAL DATA:  Placement of nasal jejunal tube EXAM: ABDOMEN - 1 VIEW COMPARISON:  10/01/2016, CT 09/20/2016 FINDINGS: Visualized lung bases are clear. Esophageal tube tip overlies the second portion of the duodenum. Pancreas stent remains in place. Nonobstructed gas pattern with moderate stools in the colon. No abnormal calcification. IMPRESSION: Feeding tube tip overlies the second portion of the duodenum. Electronically Signed   By: [name] Reno.D.  On: 10/01/2016 15:00   Dg Abd 1 View  Result Date: 10/01/2016 CLINICAL DATA:  Assess feeding tube placement. EXAM: ABDOMEN - 1 VIEW COMPARISON:  KUB of September 30, 2016 FINDINGS: The radiodense tipped feeding tube still lies in the stomach with the tip in the pre-pyloric region. The pre attic duct stent is in stable position. The bowel gas pattern is within the limits of normal. The lung bases are clear. IMPRESSION: The feeding tube tip lies in the pre-pyloric region. Advancement is recommended prior to feeding. Electronically Signed   By: David  Martinique M.D.    On: 10/01/2016 07:36   Dg Abd 1 View  Result Date: 09/30/2016 CLINICAL DATA:  Feeding tube placement EXAM: ABDOMEN - 1 VIEW COMPARISON:  Earlier imaging of 09/30/2016 FINDINGS: Feeding tube is coiled in the stomach with the tip projecting over the gastric antrum. Tube has not passed across the pylorus into the duodenum. Visualized bowel gas pattern normal. Pancreatic stent noted. Lung bases clear. IMPRESSION: Feeding tube coiled in the stomach with the tip projecting over the gastric antrum. Electronically Signed   By: Lavonia Dana M.D.   On: 09/30/2016 16:29   Ct Abdomen Pelvis W Contrast  Result Date: 09/20/2016 CLINICAL DATA:  PMHx of DM on insulin, DKA, GERD, and IBS, who presents to the Emergency Department for an evaluation of hyperglycemia that began 2 days ago. Pt reports she noticed her blood sugar "spiked" yesterday with associated left-sided abdominal pain, nausea, vomiting. History of DKA. EXAM: CT ABDOMEN AND PELVIS WITH CONTRAST TECHNIQUE: Multidetector CT imaging of the abdomen and pelvis was performed using the standard protocol following bolus administration of intravenous contrast. CONTRAST:  75mL ISOVUE-300 IOPAMIDOL (ISOVUE-300) INJECTION 61%, 157mL ISOVUE-300 IOPAMIDOL (ISOVUE-300) INJECTION 61% COMPARISON:  08/20/2016, 08/19/2016 FINDINGS: Lower chest: No acute abnormality. Hepatobiliary: The liver is homogeneous. No focal liver lesion. Gallbladder is present and normal in CT appearance. Pancreas: Pancreatic duct stent has been placed since prior exams, decompressing the previously dilated pancreatic duct. No evidence for pancreatic mass. Spleen: Normal in size without focal abnormality. Left upper quadrant splenule is present in the region of the splenic hilum. Adrenals/Urinary Tract: Normal adrenal glands. Normal appearance of both kidneys. No hydronephrosis or ureteral abnormality. Stomach/Bowel: The stomach and small bowel loops are normal in appearance. Colonic loops are normal in  appearance. Appendix is not seen. Vascular/Lymphatic: No significant vascular findings are present. No enlarged abdominal or pelvic lymph nodes. Reproductive: The uterus is present.  No adnexal mass. Other: No abdominal wall hernia or abnormality. No abdominopelvic ascites. Musculoskeletal: No acute or significant osseous findings. IMPRESSION: 1. Interval placement of pancreatic duct stent, decompressing the previously dilated pancreatic duct. 2. No bowel obstruction or evidence for abscess. Electronically Signed   By: Nolon Nations M.D.   On: 09/20/2016 16:25   Dg Fluoro Rm 1-60 Min  Result Date: 10/03/2016 CLINICAL DATA:  Feeding tube placement EXAM: FLOURO RM 1-60 MIN CONTRAST:  None FLUOROSCOPY TIME:  Fluoroscopy Time:  7 minutes 6 seconds Radiation Exposure Index (if provided by the fluoroscopic device): 30.6 mGy Number of Acquired Spot Images: Single fluoroscopic screen capture COMPARISON:  09/30/2016 FINDINGS: Pharynx was anesthetized with Cetacaine spray. Unable to pass finger roof feeding tube through LEFT nasal passages. Feeding tube was placed to the RIGHT nasal passages without difficulty through the esophagus into the stomach. Tip of the tube was advanced to the pylorus. Despite multiple manipulations in changes in position, unable to get tip of the tube to pass across pylorus into  duodenum. The tube was LEFT in place at the pylorus. IMPRESSION: Feeding tube was placed with the tip at the pylorus. Recommend followup abdominal radiograph on 10/04/2016 to determine if it has passed into the duodenum. Electronically Signed   By: Lavonia Dana M.D.   On: 10/03/2016 16:07   Dg Fluoro Rm 1-60 Min  Addendum Date: 09/30/2016   ADDENDUM REPORT: 09/30/2016 12:30 ADDENDUM: After the patient returned to her room, she experienced an episode of vomiting and dislodged the feeding tube, which remained take in her nose at 79 cm though the bulk of the tube was now coiled in her throat. Patient return to  fluoroscopy room and the catheter was repositioned with the tip again placed at the pylorus. Unable to pass the catheter into the duodenum. Tube was again taped at 79 cm. Patient instructed to lying on RIGHT side and will perform abdominal radiographs in 4 hours to assess tube position. Additional fluoroscopy time 2 minutes 42 seconds. Dose:  55.8  mGy Electronically Signed   By: Lavonia Dana M.D.   On: 09/30/2016 12:30   Result Date: 09/30/2016 CLINICAL DATA:  Dislodged feeding tube EXAM: FLOURO RM 1-60 MIN CONTRAST:  None FLUOROSCOPY TIME:  Fluoroscopy Time:  6 minutes 12 seconds Radiation Exposure Index (if provided by the fluoroscopic device): 35.4 mGy Number of Acquired Spot Images: 1 fluoroscopic screen capture COMPARISON:  09/29/2016 FINDINGS: Initial fluoroscopy demonstrated that the tip of the feeding tube placed on 09/29/2016 has been dislodged and has been retracted all the way to the nasopharynx. Using an .035 guidewire, the feeding tube was advanced into the stomach. The tip of the feeding tube was placed at the pylorus. Despite multiple manipulations with the guidewire and RIGHT lateral decubitus positioning, the tip of the tube could not be placed across the pylorus into the duodenum. Following guidewire removal, the tube was left in place at this site and the patient was instructed to lie on her RIGHT side for the next 4 hours in an attempt to get it to pass across the pylorus. Abdominal radiograph ordered for 1500 hours today. Tube was secured with the 79 cm mark at the tip of the nose. IMPRESSION: Placement of feeding tube with tip at the pylorus as above. Electronically Signed: By: Lavonia Dana M.D. On: 09/30/2016 11:13   Dg Fluoro Rm 1-60 Min  Result Date: 09/29/2016 CLINICAL DATA:  Chronic pancreatitis.  Weight loss. EXAM: FLOURO RM 1-60 MIN CONTRAST:  Isovue-300 FLUOROSCOPY TIME:  Fluoroscopy Time:  4 minutes 54 seconds Radiation Exposure Index (if provided by the fluoroscopic device): 14.5  mGy Number of Acquired Spot Images: 0 COMPARISON:  Abdominal CT 09/20/2016 FINDINGS: A weighted tip feeding tube was easily advanced through the right nostril to the pylorus. The duodenal bulb was easily cannulated, but the tube could not be advanced through the duodenal C-loop. Contrast was injected and very there was definite stasis in the proximal duodenum. No proximal duodenal distention to suggest a chronic stricture. The patient was left on her right side for 10 minutes, but the tube did not progressed during that time. Material from most recent meal was seen peristalsing in the moderately distended stomach. At 5 minutes of fluoroscopy, the decision was made to rest the bowel and allow for gravity advancement. Abundance slack was left in the stomach and the tube was positioned at the bulb/pylorus. The patient was instructed to lay on her right side as much as possible. IMPRESSION: Weighted tip feeding tube was easily advanced to  duodenal bulb, but could not be advanced into the distal duodenum. Injected contrast showed definite stasis in the proximal duodenum and food from most recent meal was also retained in the stomach. The tube tip was positioned at the bulb with adequate slack to allow for progression. The tube may need additional manipulation, suggest KUB tomorrow. Electronically Signed   By: Monte Fantasia M.D.   On: 09/29/2016 15:44       Subjective: Continues to have abdominal pain, worse after liquids  Discharge Exam: Vitals:   10/03/16 0424 10/03/16 1300  BP: 109/71 98/62  Pulse: 78 (!) 105  Resp:  18  Temp: 97.7 F (36.5 C) 98.6 F (37 C)   Vitals:   10/02/16 1600 10/02/16 2005 10/03/16 0424 10/03/16 1300  BP: 112/63 103/69 109/71 98/62  Pulse: 82 (!) 114 78 (!) 105  Resp: 12   18  Temp: 98.5 F (36.9 C) 98.6 F (37 C) 97.7 F (36.5 C) 98.6 F (37 C)  TempSrc: Oral Oral  Oral  SpO2: 100% 99% 100% 96%  Weight:      Height:        General: Pt is alert, awake, not  in acute distress Cardiovascular: RRR, S1/S2 +, no rubs, no gallops Respiratory: CTA bilaterally, no wheezing, no rhonchi Abdominal: Soft, tender in epigastrium and LLQ, ND, bowel sounds + Extremities: no edema, no cyanosis    The results of significant diagnostics from this hospitalization (including imaging, microbiology, ancillary and laboratory) are listed below for reference.     Microbiology: No results found for this or any previous visit (from the past 240 hour(s)).   Labs: BNP (last 3 results) No results for input(s): BNP in the last 8760 hours. Basic Metabolic Panel:  Recent Labs Lab 09/27/16 0435 09/29/16 0534 10/01/16 0614 10/02/16 0557  NA 137 137 134* 135  K 3.7 4.2 3.7 3.9  CL 104 104 99* 100*  CO2 26 27 28 27   GLUCOSE 240* 230* 207* 233*  BUN <5* <5* 5* 5*  CREATININE 0.39* 0.36* 0.43* 0.39*  CALCIUM 8.6* 8.9 9.3 9.2  MG  --  1.6*  --   --    Liver Function Tests:  Recent Labs Lab 09/27/16 0435 09/29/16 0534 10/01/16 0614  AST 22 28 26   ALT 14 22 21   ALKPHOS 74 88 116  BILITOT 0.2* 0.5 0.6  PROT 6.1* 6.1* 7.0  ALBUMIN 3.1* 3.1* 3.6   No results for input(s): LIPASE, AMYLASE in the last 168 hours. No results for input(s): AMMONIA in the last 168 hours. CBC:  Recent Labs Lab 09/27/16 0435 09/29/16 0534 10/01/16 0614  WBC 7.9 7.1 7.9  HGB 10.1* 11.3* 11.8*  HCT 30.6* 34.3* 35.9*  MCV 88.2 88.6 88.9  PLT 343 291 276   Cardiac Enzymes: No results for input(s): CKTOTAL, CKMB, CKMBINDEX, TROPONINI in the last 168 hours. BNP: Invalid input(s): POCBNP CBG:  Recent Labs Lab 10/03/16 0025 10/03/16 0415 10/03/16 0757 10/03/16 1123 10/03/16 1649  GLUCAP 161* 173* 198* 433* 392*   D-Dimer No results for input(s): DDIMER in the last 72 hours. Hgb A1c No results for input(s): HGBA1C in the last 72 hours. Lipid Profile No results for input(s): CHOL, HDL, LDLCALC, TRIG, CHOLHDL, LDLDIRECT in the last 72 hours. Thyroid function  studies No results for input(s): TSH, T4TOTAL, T3FREE, THYROIDAB in the last 72 hours.  Invalid input(s): FREET3 Anemia work up No results for input(s): VITAMINB12, FOLATE, FERRITIN, TIBC, IRON, RETICCTPCT in the last 72 hours. Urinalysis  Component Value Date/Time   COLORURINE YELLOW 09/20/2016 1428   APPEARANCEUR HAZY (A) 09/20/2016 1428   LABSPEC 1.030 09/20/2016 1428   PHURINE 8.0 09/20/2016 1428   GLUCOSEU 150 (A) 09/20/2016 1428   HGBUR NEGATIVE 09/20/2016 1428   BILIRUBINUR NEGATIVE 09/20/2016 1428   KETONESUR 80 (A) 09/20/2016 1428   PROTEINUR 30 (A) 09/20/2016 1428   UROBILINOGEN 0.2 05/06/2015 0045   NITRITE NEGATIVE 09/20/2016 1428   LEUKOCYTESUR NEGATIVE 09/20/2016 1428   Sepsis Labs Invalid input(s): PROCALCITONIN,  WBC,  LACTICIDVEN Microbiology No results found for this or any previous visit (from the past 240 hour(s)).   Time coordinating discharge: Over 30 minutes  SIGNED:   Kathie Dike, MD  [facility] 10/03/2016, 7:33 PM Pager   If 7PM-7AM, please contact night-coverage [email or web address] Password [email or web address]

## 2016-10-03 NOTE — Progress Notes (Signed)
  Subjective:  Patient is not sure how feeding tube came out around 3 AM today. She states she developed worsening pain and nausea with full liquids. She is agreeable to trying fluoroscopic placement of this tube. She states pain is not well controlled. Her pain is primarily in epigastric region and left mid abdomen. She had soft stool earlier today.   Objective: Blood pressure 98/62, pulse (!) 105, temperature 98.6 F (37 C), temperature source Oral, resp. rate 18, height 5\' 2"  (1.575 m), weight 110 lb (49.9 kg), last menstrual period 09/06/2016, SpO2 96 %. Patient is sitting upright in her bed. She is quiet but in no acute pain. Auscultation of lungs revealed visit her breath sounds bilaterally. Abdomen. Normal. On palpation abdomen is soft with mild to moderate tenderness midepigastric region and left mid abdomen. No guarding or noted. No LE edema or clubbing noted.  Labs/studies Results:   Recent Labs  10/01/16 0614  WBC 7.9  HGB 11.8*  HCT 35.9*  PLT 276    BMET   Recent Labs  10/01/16 0614 10/02/16 0557  NA 134* 135  K 3.7 3.9  CL 99* 100*  CO2 28 27  GLUCOSE 207* 233*  BUN 5* 5*  CREATININE 0.43* 0.39*  CALCIUM 9.3 9.2    LFT   Recent Labs  10/01/16 0614  PROT 7.0  ALBUMIN 3.6  AST 26  ALT 21  ALKPHOS 116  BILITOT 0.6     Assessment:  #1. 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 No symptomatic relief. Nasoduodenal tube was placed 2 days ago and she was tolerating Glucerna but tube accidentally came out this morning. Patient did not tolerate full liquids. Dr. Thornton Papas have been quested to proceed with fluoroscopy scopic placement of feeding tube if possible. She may eventually need PEG/PEJ. Other issue is unrelenting pain and poor control medication. I'm concerned that stent may be making her pain worse. I wonder if stent should  be removed now and not in 3 weeks as planned.   Recommendations:  Proceed with fluoroscopy scopic placement of feeding tube. I have left a message for Dr. Delrae Alfred, patient's gastroenterologist at [facility] to call me.

## 2016-10-03 NOTE — Care Management Note (Signed)
Case Management Note  Patient Details  Name: VIRDA BETTERS MRN: [id number] Date of Birth: [birth year]  Expected Discharge Date:    10/05/2016              Expected Discharge Plan:  Home/Self Care  In-House Referral:  NA  Discharge planning Services  NA  Post Acute Care Choice:  NA Choice offered to:  Patient  Status of Service:  Completed, signed off  Additional Comments: Pt no longer has [place] for feeding. Will not longer need TF at DC. [facility] rep made aware. Potential for DC over weekend.  Sherald Barge, RN 10/03/2016, 2:17 PM

## 2016-10-03 NOTE — Progress Notes (Signed)
Talked with Dr. Delrae Alfred of [facility]. Patient to be transferred to [facility]. Patient's clinical course/history discussed with hospitalist Dr.Jayarangaiah.

## 2016-10-03 NOTE — Progress Notes (Signed)
Nutrition Follow-up     INTERVENTION:  Ensure Enlive po BID, each supplement provides 350 kcal and 20 grams of protein   ProStat 30 ml BID (each 30 ml provides 100 kcal, 15 gr protein)   Multivitamin daily  Advance diet as tolerated to Soft per MD   NUTRITION DIAGNOSIS:   Inadequate oral intake related to altered GI function (acute on chronic pancreatitis) as evidenced by NPO status (Pt unable to tolerate diet advancement NJ tube being placed by IR).   GOAL:   Patient will meet greater than or equal to 90% of their needs   MONITOR: tolerance of liquids and percent intake   ASSESSMENT:  4/2-Patient having difficulty tolerating full diet advancement. Her weight has trended down over the past few months which is likely associated with her  increased protein and energy needs related to inflammation and limited tolerance or oral intake. Usual wt 52-54 kg.   4/4-The patient is sitting up in bed on her computer this morning. No vomiting so far today still c/o nausea. She has only received a few hours of tube feeding due to problems with her feeding tube.  Patient had Nasojejunal tube placed but is kinked per MD. Family has decided to have placement via endoscopy. This is planned for later today. RD will continue to follow.  She is at risk for severe acute malnutrition given her poor tolerance of nutrition intake in the setting of acute pancreatitis. Nursing to obtain current weight to assess whether she meets criteria.   4/5- Pt has tube feeding successfully advanced this morning Glucerna 1.2 @ 50 ml/hr. Talked with pt about her daily activity level and the timeframe she would prefer to receive her nutrition. 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 Her mom is retired and able to provide excellent support and assist with managing the enteral feeding. The pt has managed TPN at home before and feels very comfortable with the enteral feeds. Patient expecting to be on feeding  short term and is followed closely by Dr [name]. Based on pt feedback recommend 12 hr nocturnal nutrition delivery. She will likely also need her insulin regimen re-assessed.   4/6- Overnight pt NJ was mostly dislodged and RN finished removing. MD has advanced to full liqiuds- pt amiable to try oral nutrition supplements to help meet est nutrition needs. Her weight appears stable since admission which is surprising considering her limited nutrition intake.    Recent Labs Lab 09/29/16 0534 10/01/16 0614 10/02/16 0557  NA 137 134* 135  K 4.2 3.7 3.9  CL 104 99* 100*  CO2 27 28 27   BUN <5* 5* 5*  CREATININE 0.36* 0.43* 0.39*  CALCIUM 8.9 9.3 9.2  MG 1.6*  --   --   GLUCOSE 230* 207* 233*   Labs and Meds reviewed.   Diet Order:  Diet full liquid Room service appropriate? Yes; Fluid consistency: Thin  Skin:  Reviewed, no issues  Last BM:  09/28/16  Height:   Ht Readings from Last 1 Encounters:  10/01/16 5\' 2"  (1.575 m)    Weight:   Wt Readings from Last 1 Encounters:  10/01/16 110 lb (49.9 kg)  10/02/16 -48.8 kg (standing wt)   Ideal Body Weight:  50 kg  BMI:  Body mass index is 20.12 kg/m.  Re-Estimated Nutritional Needs (based on standing wt of 48.8 kg):   Kcal:  1710-1952  Protein:  78-87 gr   Fluid:  1.7-1.9 liters daily  EDUCATION NEEDS: discussed pt  desires related to when would be optimal time for her to receive enteral feeds and the importance of compliance with regimen to optimize intake and nutrition repletion.   Colman Cater MS,RD,CSG,LDN Office: [phone] Pager: [phone]

## 2016-10-03 NOTE — Progress Notes (Addendum)
Pts mother walked out of room and adv RN that pts nasojejunal tube was hanging almost completely out of nose. Tube feedings stopped immediately. After measuring area from where tube was taped to nose, about 2 inches was left in pts nose.  RN asked charge nurse about pulling tube . Dr Oneida Alar paged and made aware. No new orders, was adv from Dr. Oneida Alar to page Dr. [name] in the morning. Charge nurse stated to pull the remaining tube out of nose. Rn pulled tube.

## 2016-10-03 NOTE — Progress Notes (Signed)
Dr. [name] made aware of pts nasojejunal tube coming out of nose. No new orders from MD. Adv RN made Dr. Oneida Alar aware of this over night.

## 2016-10-03 NOTE — Progress Notes (Signed)
Pt c/o pain to LLQ rating 7. Pain med requested.

## 2016-10-04 LAB — GLUCOSE, CAPILLARY
GLUCOSE-CAPILLARY: 475 mg/dL — AB (ref 65–99)
Glucose-Capillary: 469 mg/dL — ABNORMAL HIGH (ref 65–99)

## 2016-10-04 MED ORDER — INSULIN ASPART 100 UNIT/ML ~~LOC~~ SOLN
15.0000 [IU] | Freq: Once | SUBCUTANEOUS | Status: AC
  Administered 2016-10-04: 15 [IU] via SUBCUTANEOUS

## 2016-10-04 NOTE — Progress Notes (Signed)
Patient left via EMS to [facility].

## 2016-10-07 ENCOUNTER — Ambulatory Visit: Payer: BC Managed Care – PPO | Admitting: Family Medicine

## 2016-10-19 ENCOUNTER — Encounter (HOSPITAL_COMMUNITY): Payer: Self-pay | Admitting: *Deleted

## 2016-10-19 ENCOUNTER — Emergency Department (HOSPITAL_COMMUNITY)
Admission: EM | Admit: 2016-10-19 | Discharge: 2016-10-19 | Disposition: A | Discharge status: Home / Self Care | Payer: BC Managed Care – PPO | Attending: Emergency Medicine | Admitting: Emergency Medicine

## 2016-10-19 DIAGNOSIS — K9423 Gastrostomy malfunction: Secondary | ICD-10-CM

## 2016-10-19 DIAGNOSIS — Y828 Other medical devices associated with adverse incidents: Secondary | ICD-10-CM | POA: Diagnosis not present

## 2016-10-19 DIAGNOSIS — Z79899 Other long term (current) drug therapy: Secondary | ICD-10-CM | POA: Diagnosis not present

## 2016-10-19 DIAGNOSIS — T85518A Breakdown (mechanical) of other gastrointestinal prosthetic devices, implants and grafts, initial encounter: Secondary | ICD-10-CM | POA: Insufficient documentation

## 2016-10-19 DIAGNOSIS — E109 Type 1 diabetes mellitus without complications: Secondary | ICD-10-CM | POA: Diagnosis not present

## 2016-10-19 LAB — CBG MONITORING, ED: GLUCOSE-CAPILLARY: 101 mg/dL — AB (ref 65–99)

## 2016-10-19 MED ORDER — SODIUM BICARBONATE 650 MG PO TABS
650.0000 mg | ORAL_TABLET | Freq: Once | ORAL | Status: AC
  Administered 2016-10-19: 650 mg via ORAL
  Filled 2016-10-19: qty 1

## 2016-10-19 MED ORDER — PANCRELIPASE (LIP-PROT-AMYL) 12000-38000 UNITS PO CPEP
24000.0000 [IU] | ORAL_CAPSULE | Freq: Once | ORAL | Status: AC
Start: 2016-10-19 — End: 2016-10-19
  Administered 2016-10-19: 24000 [IU] via ORAL
  Filled 2016-10-19: qty 2

## 2016-10-19 NOTE — ED Notes (Signed)
AC called to bring medications at this time. PEG tube meets resistance when attempting to push [employer]. Residual soda left into tube and will attempt to flush again prior to infusing pancreatic enzyme protocol.

## 2016-10-19 NOTE — ED Provider Notes (Signed)
[facility] DEPT Provider Note   CSN: [id number] Arrival date & time: 10/19/16  2001     History   Chief Complaint Chief Complaint  Patient presents with  . feeding tube problems    HPI Linda Walls is a 26 y.o. female with past medical history as outlined below, most significant for chronic pancreatitis recently treated with pancreatic duct stenting and PEG tube placement for pancreatic rest which was placed one week ago, presenting with blockage of the peg tube since this am.  She has tried flushing with warm water and tried Sprite which has not relieved the blockage.  She denies n/v/d or abdominal pain, no acid reflux or fevers.  She does have mild chronic upper abdominal pain which is her baseline and unchanged today.  She is scheduled to see her GI specialist at [facility] in 4 days for removal of the pancreatic stent.  The history is provided by the patient and a parent.    Past Medical History:  Diagnosis Date  . DKA (diabetic ketoacidoses) ([place])   . DM type 1 (diabetes mellitus, type 1) ([place])   . GERD (gastroesophageal reflux disease)   . Hereditary pancreatitis   . Hernia   . IBS (irritable bowel syndrome)   . Jaundice   . Migraines     Patient Active Problem List   Diagnosis Date Noted  . Hyperglycemia   . Acute on chronic pancreatitis ([place]) 09/20/2016  . 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 Polycythemia   . Pancreatitis 01/02/2016  . Uncontrolled diabetes mellitus secondary to pancreatic insufficiency ([place]) 06/06/2015  . 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 Pancreatic insufficiency 05/23/2015  . Vitamin D deficiency 05/23/2015  . DKA (diabetic ketoacidoses) ([place]) 05/06/2015  . Intractable nausea and vomiting 04/16/2015  . Abdominal pain, left lower quadrant 04/16/2015  . Leukocytosis 04/16/2015  . Diabetic ketoacidosis without coma associated with type 1 diabetes mellitus ([place])   . Migraine headache 10/27/2014  . Abdominal pain,  chronic, epigastric 08/15/2013  . Pyelonephritis 03/06/2013  . Acute gastritis 12/23/2012  . Nausea and vomiting 05/01/2012  . Hypoglycemia associated with diabetes ([place]) 05/01/2012  . GERD (gastroesophageal reflux disease) 04/29/2012  . Anemia 05/19/2011  . Hereditary pancreatitis 05/18/2011  . Hyponatremia 05/18/2011  . Hypokalemia 05/18/2011  . Sinus tachycardia ([place]) 05/18/2011  . Dehydration 05/18/2011  . Thrombocytosis ([place]) 05/18/2011    Past Surgical History:  Procedure Laterality Date  . BILE DUCT STENT PLACEMENT  2006 at [place] 6 months after it was placed.  . ESOPHAGOGASTRODUODENOSCOPY (EGD) WITH PROPOFOL N/A 10/01/2016   Procedure: ESOPHAGOGASTRODUODENOSCOPY (EGD) WITH PROPOFOL With feeding tube placement.;  Surgeon: Rogene Houston, MD;  Location: [facility];  Service: Endoscopy;  Laterality: N/A;  . EUS/ERCP  08/28/2016   At [facility] with placement of pancreatic duct stent  . HERNIA REPAIR     As infant.  Marland Kitchen PANCREATIC CYST DRAINAGE  at age 36 year    OB History    No data available       Home Medications    Prior to Admission medications   Medication Sig Start Date End Date Taking? Authorizing Provider  Cholecalciferol (VITAMIN D-1000 MAX ST) 1000 units tablet Take 1,000 Units by mouth daily.     Historical Provider, MD  CREON 24000-76000 units CPEP Take two capsules with meals and one with snacks. 07/15/16   Historical Provider, MD  ferrous sulfate (FERROUSUL) 325 (65 FE) MG tablet Take 325 mg by mouth daily with breakfast.  Historical Provider, MD  gabapentin (NEURONTIN) 100 MG capsule Take 100mg  by mouth three times a day and 200mg  (2 caps) by mouth at bedtime 09/13/16   Historical Provider, MD  HYDROmorphone (DILAUDID) 1 MG/ML injection Inject 1 mL (1 mg total) into the vein every 2 (two) hours as needed for severe pain. 10/03/16   Kathie Dike, MD  hyoscyamine (LEVSIN, ANASPAZ) 0.125 MG tablet Take 0.125 mg by mouth every 4 (four) hours as  needed for bladder spasms or cramping.  08/02/16   Historical Provider, MD  insulin aspart (NOVOLOG) 100 UNIT/ML injection Inject 12 Units into the skin 3 (three) times daily before meals. Use sliding scale if sugar is running high.    Historical Provider, MD  insulin degludec (TRESIBA FLEXTOUCH) 100 UNIT/ML SOPN FlexTouch Pen Inject 30 Units into the skin daily at 10 pm.    Historical Provider, MD  omeprazole (PRILOSEC) 20 MG capsule Take 20 mg by mouth daily.  08/02/16   Historical Provider, MD  oxyCODONE-acetaminophen (PERCOCET) 10-325 MG tablet Take 1 tablet by mouth every 4 (four) hours as needed.  09/03/16   Historical Provider, MD  promethazine (PHENERGAN) 25 MG/ML injection Inject 0.5 mLs (12.5 mg total) into the vein every 6 (six) hours as needed. 10/03/16   Kathie Dike, MD  SUMAtriptan (IMITREX) 50 MG tablet Take 50 mg by mouth every 2 (two) hours as needed for migraine.  08/15/16   Historical Provider, MD  vitamin C (ASCORBIC ACID) 500 MG tablet Take 500 mg by mouth.    Historical Provider, MD    Family History Family History  Problem Relation Age of Onset  . Pancreatitis Father 30    hereditary  . Hypertension Father   . GER disease Father   . Hypothyroidism Mother   . Breast cancer Maternal Aunt   . Congenital heart disease Maternal Grandfather   . Prostate cancer Maternal Grandfather   . Alzheimer's disease Maternal Grandfather   . Colon cancer Neg Hx     Social History Social History  Substance Use Topics  . Smoking status: Never Smoker  . Smokeless tobacco: Never Used  . Alcohol use No     Allergies   Shellfish allergy; Cefzil [cefprozil]; Penicillins; and Sulfa antibiotics   Review of Systems Review of Systems  Constitutional: Negative for chills and fever.  HENT: Negative for congestion and sore throat.   Eyes: Negative.   Respiratory: Negative for chest tightness and shortness of breath.   Cardiovascular: Negative for chest pain.  Gastrointestinal: Negative  for abdominal pain, diarrhea, nausea and vomiting.  Genitourinary: Negative.   Musculoskeletal: Negative for arthralgias, joint swelling and neck pain.  Skin: Negative.  Negative for rash and wound.  Neurological: Negative for dizziness, weakness, light-headedness, numbness and headaches.  Psychiatric/Behavioral: Negative.      Physical Exam Updated Vital Signs BP 118/77 (BP Location: Right Arm)   Pulse (!) 134   Temp 99.2 F (37.3 C)   Resp 20   Ht 5\' 2"  (1.575 m)   Wt 46.7 kg   LMP 10/05/2016   SpO2 100%   BMI 18.84 kg/m   Physical Exam  Constitutional: She appears well-developed and well-nourished.  HENT:  Head: Normocephalic and atraumatic.  Eyes: Conjunctivae are normal.  Neck: Normal range of motion.  Cardiovascular: Normal rate, regular rhythm, normal heart sounds and intact distal pulses.   Pulmonary/Chest: Effort normal and breath sounds normal. She has no wheezes.  Abdominal: Soft. Bowel sounds are normal. She exhibits no distension. There is  no tenderness.  PEG tube  In position LUQ.  No leaking.   Musculoskeletal: Normal range of motion.  Neurological: She is alert.  Skin: Skin is warm and dry.  Psychiatric: She has a normal mood and affect.  Nursing note and vitals reviewed.    ED Treatments / Results  Labs (all labs ordered are listed, but only abnormal results are displayed) Labs Reviewed  CBG MONITORING, ED - Abnormal; Notable for the following:       Result Value   Glucose-Capillary 101 (*)    All other components within normal limits    EKG  EKG Interpretation None       Radiology No results found.  Procedures Procedures (including critical care time)  Medications Ordered in ED Medications  lipase/protease/amylase (CREON) capsule 24,000 Units (24,000 Units Oral Given 10/19/16 2116)    And  sodium bicarbonate tablet 650 mg (650 mg Oral Given 10/19/16 2116)     Initial Impression / Assessment and Plan / ED Course  I have reviewed  the triage vital signs and the nursing notes.  Pertinent labs & imaging results that were available during my care of the patient were reviewed by me and considered in my medical decision making (see chart for details).     PEG tube protocol used with creon and sodium bicarb by RN which removed blockage with the PEG tube freely flushing.  Pt tolerated well. Prn f/u anticipated.  Final Clinical Impressions(s) / ED Diagnoses   Final diagnoses:  PEG tube malfunction [facility])    New Prescriptions New Prescriptions   No medications on file     Evalee Jefferson, PA-C 10/19/16 [place], DO 10/22/16 1517

## 2016-10-19 NOTE — ED Triage Notes (Signed)
Pt's feeding tube has been stopped since this morning.

## 2016-10-19 NOTE — ED Notes (Signed)
Approx 115mL of water flushed through PEG tube at this time with no resistance noted.

## 2016-10-19 NOTE — ED Notes (Signed)
Pt states understanding of d/c instructions, follow up care, and proper PEG tube flushing. No further questions or concerns at this time.

## 2016-10-21 ENCOUNTER — Other Ambulatory Visit: Payer: Self-pay | Admitting: "Endocrinology

## 2016-10-21 ENCOUNTER — Encounter: Payer: Self-pay | Admitting: Family Medicine

## 2016-10-21 ENCOUNTER — Ambulatory Visit (INDEPENDENT_AMBULATORY_CARE_PROVIDER_SITE_OTHER): Payer: BC Managed Care – PPO | Admitting: Family Medicine

## 2016-10-21 VITALS — BP 116/78 | Temp 97.5°F | Ht 62.0 in | Wt 111.0 lb

## 2016-10-21 DIAGNOSIS — R1013 Epigastric pain: Secondary | ICD-10-CM | POA: Diagnosis not present

## 2016-10-21 DIAGNOSIS — K861 Other chronic pancreatitis: Secondary | ICD-10-CM | POA: Diagnosis not present

## 2016-10-21 DIAGNOSIS — Z13220 Encounter for screening for lipoid disorders: Secondary | ICD-10-CM

## 2016-10-21 DIAGNOSIS — E109 Type 1 diabetes mellitus without complications: Secondary | ICD-10-CM

## 2016-10-21 DIAGNOSIS — Z79899 Other long term (current) drug therapy: Secondary | ICD-10-CM

## 2016-10-21 DIAGNOSIS — G8929 Other chronic pain: Secondary | ICD-10-CM

## 2016-10-21 MED ORDER — OXYCODONE-ACETAMINOPHEN 10-325 MG PO TABS: ORAL_TABLET | ORAL | 0 refills | Status: DC

## 2016-10-21 MED ORDER — OXYCODONE-ACETAMINOPHEN 10-325 MG PO TABS: ORAL_TABLET | ORAL | 0 refills | Status: AC

## 2016-10-21 NOTE — Progress Notes (Signed)
   Subjective:    Patient ID: Linda Walls, female    DOB: [birth year], 26 y.o.   MRN: [id number]  HPIFollow up hospitalization.   Sun night feeding tu  Stet coming out the 2rh  Then wonderig  folloowing u o Thursday  Glucoses a litlle on the I side  Constant feeding tube, ck ing sugars on the reg bsi   Pain and discomfort, taking half a ill tow or three times per dy more three  Nausea  None fo awhile  Mid may o wight now down   Getting nutr back on board  45 constantly     Review of Systems No headache, no major weight loss or weight gain, no chest pain no back pain abdominal pain no change in bowel habits complete ROS otherwise negative     Objective:   Physical Exam Alert and oriented, vitals reviewed and stable,Patient does appear somewhat thin compared to usual, NAD ENT-TM's and ext canals WNL bilat via otoscopic exam Soft palate, tonsils and post pharynx WNL via oropharyngeal exam Neck-symmetric, no masses; thyroid nonpalpable and nontender Pulmonary-no tachypnea or accessory muscle use; Clear without wheezes via auscultation Card--no abnrml murmurs, rhythm reg and rate WNL Carotid pulses symmetric, without bruits Abdomen feeding tube present slight erythema around wound no evidence of frank infection       Assessment & Plan:  Impression 1 status post pancreatitis admission with stricture and stent placement. #2 malnutrition with feeding tube present on continuous feedings. Has had dietary consultation and more pending. Needs blood work discussed #3 chronic pain discussed will use one half tablet up to 4 times a day. Symptom care discussed follow-up in several months.  Greater than 50% of this 25 minute face to face visit was spent in counseling and discussion and coordination of care regarding the above diagnosis/diagnosies

## 2016-10-22 ENCOUNTER — Ambulatory Visit: Payer: BC Managed Care – PPO | Admitting: "Endocrinology

## 2016-10-23 ENCOUNTER — Ambulatory Visit: Payer: BC Managed Care – PPO | Admitting: Family Medicine

## 2016-10-23 LAB — BASIC METABOLIC PANEL
BUN/Creatinine Ratio: 24 — ABNORMAL HIGH (ref 9–23)
BUN: 11 mg/dL (ref 6–20)
CO2: 24 mmol/L (ref 18–29)
CREATININE: 0.46 mg/dL — AB (ref 0.57–1.00)
Calcium: 9.9 mg/dL (ref 8.7–10.2)
Chloride: 97 mmol/L (ref 96–106)
GFR calc Af Amer: 162 mL/min/{1.73_m2} (ref >59)
GFR calc non Af Amer: 141 mL/min/{1.73_m2} (ref >59)
Glucose: 200 mg/dL — ABNORMAL HIGH (ref 65–99)
Potassium: 5.4 mmol/L — ABNORMAL HIGH (ref 3.5–5.2)
SODIUM: 136 mmol/L (ref 134–144)

## 2016-10-23 LAB — CBC WITH DIFFERENTIAL/PLATELET
Basophils Absolute: 0 10*3/uL (ref 0.0–0.2)
Basos: 0 %
EOS (ABSOLUTE): 0.4 10*3/uL (ref 0.0–0.4)
Eos: 5 %
HEMOGLOBIN: 12.1 g/dL (ref 11.1–15.9)
Hematocrit: 37.9 % (ref 34.0–46.6)
IMMATURE GRANS (ABS): 0 10*3/uL (ref 0.0–0.1)
Immature Granulocytes: 0 %
LYMPHS: 26 %
Lymphocytes Absolute: 2.3 10*3/uL (ref 0.7–3.1)
MCH: 28.5 pg (ref 26.6–33.0)
MCHC: 31.9 g/dL (ref 31.5–35.7)
MCV: 89 fL (ref 79–97)
Monocytes Absolute: 0.6 10*3/uL (ref 0.1–0.9)
Monocytes: 7 %
Neutrophils Absolute: 5.3 10*3/uL (ref 1.4–7.0)
Neutrophils: 62 %
Platelets: 611 10*3/uL — ABNORMAL HIGH (ref 150–379)
RBC: 4.25 x10E6/uL (ref 3.77–5.28)
RDW: 14.8 % (ref 12.3–15.4)
WBC: 8.6 10*3/uL (ref 3.4–10.8)

## 2016-10-23 LAB — HEPATIC FUNCTION PANEL
ALT: 9 IU/L (ref 0–32)
AST: 14 IU/L (ref 0–40)
Albumin: 4.4 g/dL (ref 3.5–5.5)
Alkaline Phosphatase: 78 IU/L (ref 39–117)
Bilirubin Total: 0.2 mg/dL (ref 0.0–1.2)
Bilirubin, Direct: 0.08 mg/dL (ref 0.00–0.40)
Total Protein: 7.2 g/dL (ref 6.0–8.5)

## 2016-10-23 LAB — LIPID PANEL
CHOL/HDL RATIO: 3.2 ratio (ref 0.0–4.4)
CHOLESTEROL TOTAL: 142 mg/dL (ref 100–199)
HDL: 45 mg/dL (ref >39)
LDL CALC: 60 mg/dL (ref 0–99)
Triglycerides: 184 mg/dL — ABNORMAL HIGH (ref 0–149)
VLDL Cholesterol Cal: 37 mg/dL (ref 5–40)

## 2016-10-23 LAB — MAGNESIUM: MAGNESIUM: 2.1 mg/dL (ref 1.6–2.3)

## 2016-10-23 LAB — PREALBUMIN: PREALBUMIN: 28 mg/dL (ref 14–35)

## 2016-10-27 ENCOUNTER — Encounter: Payer: Self-pay | Admitting: Family Medicine

## 2016-10-29 ENCOUNTER — Other Ambulatory Visit: Payer: Self-pay | Admitting: "Endocrinology

## 2016-10-29 LAB — COMPREHENSIVE METABOLIC PANEL
ALBUMIN: 4.3 g/dL (ref 3.6–5.1)
ALK PHOS: 85 U/L (ref 33–115)
ALT: 11 U/L (ref 6–29)
AST: 15 U/L (ref 10–30)
BILIRUBIN TOTAL: 0.3 mg/dL (ref 0.2–1.2)
BUN: 19 mg/dL (ref 7–25)
CHLORIDE: 98 mmol/L (ref 98–110)
CO2: 24 mmol/L (ref 20–31)
CREATININE: 0.56 mg/dL (ref 0.50–1.10)
Calcium: 9.8 mg/dL (ref 8.6–10.2)
Glucose, Bld: 174 mg/dL — ABNORMAL HIGH (ref 65–99)
POTASSIUM: 4.7 mmol/L (ref 3.5–5.3)
Sodium: 133 mmol/L — ABNORMAL LOW (ref 135–146)
Total Protein: 7.7 g/dL (ref 6.1–8.1)

## 2016-10-30 ENCOUNTER — Encounter (HOSPITAL_COMMUNITY): Payer: Self-pay | Admitting: Emergency Medicine

## 2016-10-30 ENCOUNTER — Emergency Department (HOSPITAL_COMMUNITY)
Admission: EM | Admit: 2016-10-30 | Discharge: 2016-10-30 | Disposition: A | Discharge status: Home / Self Care | Payer: BC Managed Care – PPO | Attending: Emergency Medicine | Admitting: Emergency Medicine

## 2016-10-30 DIAGNOSIS — Z431 Encounter for attention to gastrostomy: Secondary | ICD-10-CM | POA: Diagnosis present

## 2016-10-30 DIAGNOSIS — T85598A Other mechanical complication of other gastrointestinal prosthetic devices, implants and grafts, initial encounter: Secondary | ICD-10-CM

## 2016-10-30 DIAGNOSIS — Z79899 Other long term (current) drug therapy: Secondary | ICD-10-CM | POA: Diagnosis not present

## 2016-10-30 DIAGNOSIS — E109 Type 1 diabetes mellitus without complications: Secondary | ICD-10-CM | POA: Insufficient documentation

## 2016-10-30 LAB — HEMOGLOBIN A1C
Hgb A1c MFr Bld: 8.3 % — ABNORMAL HIGH (ref <5.7)
Mean Plasma Glucose: 192 mg/dL

## 2016-10-30 MED ORDER — SODIUM BICARBONATE 650 MG PO TABS
650.0000 mg | ORAL_TABLET | Freq: Once | ORAL | Status: AC
  Administered 2016-10-30: 650 mg via ORAL
  Filled 2016-10-30 (×2): qty 1

## 2016-10-30 MED ORDER — PANCRELIPASE (LIP-PROT-AMYL) 12000-38000 UNITS PO CPEP
24000.0000 [IU] | ORAL_CAPSULE | Freq: Once | ORAL | Status: AC
  Administered 2016-10-30: 24000 [IU] via ORAL
  Filled 2016-10-30 (×2): qty 2

## 2016-10-30 NOTE — Discharge Instructions (Signed)
Contact your doctors at [facility] tomorrow and let them know about your emergency visit and how your feeding tube is doing

## 2016-10-30 NOTE — ED Provider Notes (Signed)
[facility] DEPT Provider Note   CSN: [id number] Arrival date & time: 10/30/16  2009     History   Chief Complaint Chief Complaint  Patient presents with  . Clogged Feeding Tube    HPI Linda Walls is a 26 y.o. female.  HPI  26 year old female with a history of type 1 diabetes with prior DKA and chronic pancreatitis with a recent G-tube placed about 3 weeks ago presents with a clogged G-tube. Noticed this around 1 PM today. She has tried flushing it as well as tried Coca-Cola with no relief. Similar to when she was here on 4/22. She is currently using to for feeds but she is also taking oral fluids. She otherwise is not feel sick including no fevers, vomiting. No significant leaking of the tube. No redness.  Past Medical History:  Diagnosis Date  . DKA (diabetic ketoacidoses) ([place])   . DM type 1 (diabetes mellitus, type 1) ([place])   . GERD (gastroesophageal reflux disease)   . Hereditary pancreatitis   . Hernia   . IBS (irritable bowel syndrome)   . Jaundice   . Migraines     Patient Active Problem List   Diagnosis Date Noted  . Hyperglycemia   . Acute on chronic pancreatitis ([place]) 09/20/2016  . 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 Polycythemia   . Pancreatitis 01/02/2016  . Uncontrolled diabetes mellitus secondary to pancreatic insufficiency ([place]) 06/06/2015  . 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 Pancreatic insufficiency 05/23/2015  . Vitamin D deficiency 05/23/2015  . DKA (diabetic ketoacidoses) ([place]) 05/06/2015  . Intractable nausea and vomiting 04/16/2015  . Abdominal pain, left lower quadrant 04/16/2015  . Leukocytosis 04/16/2015  . Diabetic ketoacidosis without coma associated with type 1 diabetes mellitus ([place])   . Migraine headache 10/27/2014  . Abdominal pain, chronic, epigastric 08/15/2013  . Pyelonephritis 03/06/2013  . Acute gastritis 12/23/2012  . Nausea and vomiting 05/01/2012  . Hypoglycemia associated with diabetes ([place])  05/01/2012  . GERD (gastroesophageal reflux disease) 04/29/2012  . Anemia 05/19/2011  . Hereditary pancreatitis 05/18/2011  . Hyponatremia 05/18/2011  . Hypokalemia 05/18/2011  . Sinus tachycardia ([place]) 05/18/2011  . Dehydration 05/18/2011  . Thrombocytosis ([place]) 05/18/2011    Past Surgical History:  Procedure Laterality Date  . ABDOMINAL SURGERY  2018   feeding tube  . BILE DUCT STENT PLACEMENT  2006 at [facility] 6 months after it was placed.  . ESOPHAGOGASTRODUODENOSCOPY (EGD) WITH PROPOFOL N/A 10/01/2016   Procedure: ESOPHAGOGASTRODUODENOSCOPY (EGD) WITH PROPOFOL With feeding tube placement.;  Surgeon: Rogene Houston, MD;  Location: [facility] ENDO [facility];  Service: Endoscopy;  Laterality: N/A;  . EUS/ERCP  08/28/2016   At [facility] with placement of pancreatic duct stent  . HERNIA REPAIR     As infant.  Marland Kitchen PANCREATIC CYST DRAINAGE  at age 83 year    OB History    No data available       Home Medications    Prior to Admission medications   Medication Sig Start Date End Date Taking? Authorizing Provider  Cholecalciferol (VITAMIN D-1000 MAX ST) 1000 units tablet Take 1,000 Units by mouth daily.     Historical Provider, MD  CREON 24000-76000 units CPEP Take two capsules with meals and one with snacks. 07/15/16   Historical Provider, MD  ferrous sulfate (FERROUSUL) 325 (65 FE) MG tablet Take 325 mg by mouth daily with breakfast.     Historical Provider, MD  hyoscyamine (LEVSIN, ANASPAZ) 0.125 MG tablet Take 0.125 mg by mouth  every 4 (four) hours as needed for bladder spasms or cramping.  08/02/16   Historical Provider, MD  insulin aspart (NOVOLOG FLEXPEN) 100 UNIT/ML FlexPen Inject 15-21 Units into the skin 3 (three) times daily with meals. 10/22/16   [name], MD  insulin degludec (TRESIBA FLEXTOUCH) 100 UNIT/ML SOPN FlexTouch Pen Inject 30 Units into the skin daily at 10 pm.    Historical Provider, MD  omeprazole (PRILOSEC) 20 MG capsule Take 20 mg by mouth daily.  08/02/16    Historical Provider, MD  oxyCODONE-acetaminophen (PERCOCET) 10-325 MG tablet Take one half up to QID 10/21/16   [name], MD  promethazine (PHENERGAN) 25 MG/ML injection Inject 0.5 mLs (12.5 mg total) into the vein every 6 (six) hours as needed. 10/03/16   Kathie Dike, MD  SUMAtriptan (IMITREX) 50 MG tablet Take 50 mg by mouth every 2 (two) hours as needed for migraine.  08/15/16   Historical Provider, MD  vitamin C (ASCORBIC ACID) 500 MG tablet Take 500 mg by mouth.    Historical Provider, MD    Family History Family History  Problem Relation Age of Onset  . Pancreatitis Father 30    hereditary  . Hypertension Father   . GER disease Father   . Hypothyroidism Mother   . Breast cancer Maternal Aunt   . Congenital heart disease Maternal Grandfather   . Prostate cancer Maternal Grandfather   . Alzheimer's disease Maternal Grandfather   . Colon cancer Neg Hx     Social History Social History  Substance Use Topics  . Smoking status: Never Smoker  . Smokeless tobacco: Never Used  . Alcohol use No     Allergies   Shellfish allergy; Cefzil [cefprozil]; Penicillins; and Sulfa antibiotics   Review of Systems Review of Systems  Constitutional: Negative for fever.  Gastrointestinal: Negative for abdominal pain and vomiting.  All other systems reviewed and are negative.    Physical Exam Updated Vital Signs BP 120/84 (BP Location: Left Arm)   Pulse 91   Temp 99.1 F (37.3 C) (Oral)   Resp 18   Ht 5\' 2"  (1.575 m)   Wt 110 lb (49.9 kg)   LMP 10/05/2016   SpO2 99%   BMI 20.12 kg/m   Physical Exam  Constitutional: She is oriented to person, place, and time. She appears well-developed and well-nourished. No distress.  HENT:  Head: Normocephalic and atraumatic.  Right Ear: External ear normal.  Left Ear: External ear normal.  Nose: Nose normal.  Eyes: Right eye exhibits no discharge. Left eye exhibits no discharge.  Cardiovascular: Normal rate, regular rhythm and  normal heart sounds.   Pulmonary/Chest: Effort normal and breath sounds normal.  Abdominal: Soft. There is no tenderness.  g-tube in place in LUQ. No surrounding cellulitis or tenderness  Neurological: She is alert and oriented to person, place, and time.  Skin: Skin is warm and dry. She is not diaphoretic.  Nursing note and vitals reviewed.    ED Treatments / Results  Labs (all labs ordered are listed, but only abnormal results are displayed) Labs Reviewed - No data to display  EKG  EKG Interpretation None       Radiology No results found.  Procedures Procedures (including critical care time)  Medications Ordered in ED Medications  lipase/protease/amylase (CREON) capsule 24,000 Units (not administered)    And  sodium bicarbonate tablet 650 mg (not administered)  lipase/protease/amylase (CREON) capsule 24,000 Units (24,000 Units Oral Given 10/30/16 2136)    And  sodium bicarbonate tablet 650 mg (650 mg Oral Given 10/30/16 2136)     Initial Impression / Assessment and Plan / ED Course  I have reviewed the triage vital signs and the nursing notes.  Pertinent labs & imaging results that were available during my care of the patient were reviewed by me and considered in my medical decision making (see chart for details).     First attempt to de-clog did not work. Will try one more time. Clinically appears well. She can follow up with her [place] if it still doesn't unclog as she can tolerate PO and would be stable for d/c. Call them tomorrow. Care to Dr. Roderic Palau, d/c after 2nd attempt if either successful or not.  Final Clinical Impressions(s) / ED Diagnoses   Final diagnoses:  Clogged feeding tube    New Prescriptions New Prescriptions   No medications on file     Sherwood Gambler, MD 10/30/16 2215

## 2016-10-30 NOTE — ED Notes (Signed)
Able to flush water through J-tube, instructed pt to flush tube again when she gets home.  Pt states understanding of care given and follow up instructions.  Ambulated from ED a/o, steady gait

## 2016-10-30 NOTE — ED Notes (Signed)
Feeding tube flushed with medications and water. Fluid and medication to stay in tube to help break up clog for 15 minutes.

## 2016-10-30 NOTE — ED Triage Notes (Signed)
Pt has a feeding tube that she gets continuous feedings.  Became clogged and unable to unclog

## 2016-11-04 ENCOUNTER — Ambulatory Visit (INDEPENDENT_AMBULATORY_CARE_PROVIDER_SITE_OTHER): Payer: BC Managed Care – PPO | Admitting: "Endocrinology

## 2016-11-04 ENCOUNTER — Encounter: Payer: Self-pay | Admitting: "Endocrinology

## 2016-11-04 VITALS — BP 114/81 | HR 102 | Ht 62.0 in | Wt 112.0 lb

## 2016-11-04 DIAGNOSIS — E0865 Diabetes mellitus due to underlying condition with hyperglycemia: Secondary | ICD-10-CM

## 2016-11-04 DIAGNOSIS — K8689 Other specified diseases of pancreas: Secondary | ICD-10-CM

## 2016-11-04 DIAGNOSIS — K8681 Exocrine pancreatic insufficiency: Secondary | ICD-10-CM | POA: Diagnosis not present

## 2016-11-04 DIAGNOSIS — E559 Vitamin D deficiency, unspecified: Secondary | ICD-10-CM | POA: Diagnosis not present

## 2016-11-04 DIAGNOSIS — IMO0002 Reserved for concepts with insufficient information to code with codable children: Secondary | ICD-10-CM

## 2016-11-04 MED ORDER — INSULIN ASPART 100 UNIT/ML FLEXPEN: 8.0000 [IU] | PEN_INJECTOR | Freq: Three times a day (TID) | SUBCUTANEOUS | 2 refills | Status: AC

## 2016-11-04 NOTE — Progress Notes (Signed)
Subjective:    Patient ID: Linda Walls, female    DOB: 11-Mar-1991, PCP [name], MD   Past Medical History:  Diagnosis Date  . DKA (diabetic ketoacidoses) ([place])   . DM type 1 (diabetes mellitus, type 1) ([place])   . GERD (gastroesophageal reflux disease)   . Hereditary pancreatitis   . Hernia   . IBS (irritable bowel syndrome)   . Jaundice   . Migraines    Past Surgical History:  Procedure Laterality Date  . ABDOMINAL SURGERY  2018   feeding tube  . BILE DUCT STENT PLACEMENT  2006 at [place] 6 months after it was placed.  . ESOPHAGOGASTRODUODENOSCOPY (EGD) WITH PROPOFOL N/A 10/01/2016   Procedure: ESOPHAGOGASTRODUODENOSCOPY (EGD) WITH PROPOFOL With feeding tube placement.;  Surgeon: Rogene Houston, MD;  [facility];  Service: Endoscopy;  Laterality: N/A;  . EUS/ERCP  08/28/2016   At [facility] with placement of pancreatic duct stent  . HERNIA REPAIR     As infant.  Marland Kitchen PANCREATIC CYST DRAINAGE  at age 76 year   Social History   Social History  . Marital status: Single    Spouse name: N/A  . Number of children: N/A  . Years of education: N/A   Social History Main Topics  . Smoking status: Never Smoker  . Smokeless tobacco: Never Used  . Alcohol use No  . Drug use: No  . Sexual activity: Not Currently    Birth control/ protection: None   Other Topics Concern  . None   Social History Narrative  . None   Outpatient Encounter Prescriptions as of 11/04/2016  Medication Sig  . Cholecalciferol (VITAMIN D-1000 MAX ST) 1000 units tablet Take 1,000 Units by mouth daily.   Marland Kitchen CREON 24000-76000 units CPEP Take two capsules with meals and one with snacks.  . ferrous sulfate (FERROUSUL) 325 (65 FE) MG tablet Take 325 mg by mouth daily with breakfast.   . hyoscyamine (LEVSIN, ANASPAZ) 0.125 MG tablet Take 0.125 mg by mouth every 4 (four) hours as needed for bladder spasms or cramping.   . insulin aspart (NOVOLOG FLEXPEN) 100 UNIT/ML FlexPen  Inject 8-14 Units into the skin 3 (three) times daily with meals.  . insulin degludec (TRESIBA FLEXTOUCH) 100 UNIT/ML SOPN FlexTouch Pen Inject 24 Units into the skin daily at 10 pm.  . omeprazole (PRILOSEC) 20 MG capsule Take 20 mg by mouth daily.   Marland Kitchen oxyCODONE-acetaminophen (PERCOCET) 10-325 MG tablet Take one half up to QID  . promethazine (PHENERGAN) 25 MG/ML injection Inject 0.5 mLs (12.5 mg total) into the vein every 6 (six) hours as needed.  . SUMAtriptan (IMITREX) 50 MG tablet Take 50 mg by mouth every 2 (two) hours as needed for migraine.   . vitamin C (ASCORBIC ACID) 500 MG tablet Take 500 mg by mouth.  . [DISCONTINUED] insulin aspart (NOVOLOG FLEXPEN) 100 UNIT/ML FlexPen Inject 15-21 Units into the skin 3 (three) times daily with meals.   No facility-administered encounter medications on file as of 11/04/2016.    ALLERGIES: Allergies  Allergen Reactions  . Shellfish Allergy Nausea And Vomiting  . Cefzil [Cefprozil] Rash  . Penicillins Rash    Has patient had a PCN reaction causing immediate rash, facial/tongue/throat swelling, SOB or lightheadedness with hypotension: NO Has patient had a PCN reaction causing severe rash involving mucus membranes or skin necrosis: NO Has patient had a PCN reaction that required hospitalization: NO Has patient had a PCN reaction occurring  within the last 10 years: NO If all of the above answers are "NO", then may proceed with Cephalosporin use.   . Sulfa Antibiotics Rash   VACCINATION STATUS: Immunization History  Administered Date(s) Administered  . DTaP 06/19/1993, 08/20/1993, 10/16/1993, 04/28/1994, 12/04/1998  . Hepatitis B 06/19/1993, 10/16/1993  . IPV 06/19/1993, 08/20/1993, 10/16/1993, 12/04/1998  . Influenza Split 05/19/2011, 05/01/2012  . Influenza, Seasonal, Injecte, Preservative Fre 04/15/2016  . Influenza,inj,Quad PF,36+ Mos 03/08/2013, 02/23/2015  . MMR 04/28/1994, 12/04/1998  . Pneumococcal Polysaccharide-23 05/19/2011,  10/16/2014, 08/20/2016  . 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 - Due to intolerance to oral nutrition, she was initiated on to feeding. - She was continued on strict basal/bolus insulin therapy. Her last visit A1c of 8.3% was improving from her prior records.  - This time around , she was able to avoid diabetes ketoacidosis. She remains on continuous tube feeding using Osmolite, She was discharged on reduced dose of her basal/bolus insulin regimen.    - She has lost significant amount of weight, currently reversing the loss.  -She is  using DEXCOM continuous glucose monitoring device. She did bring her logs, showing significant fluctuation in her blood glucose readings, largely above target.  she also remains on Creon 24000 ( 1 caps TIDAC , 1 caps per snacks). she has regular menstrual cycles. - He feels better  since her discharge. -She is up-to-date on ophthalmology and dental care. -She is  following up with  [facility] services.  -Review of Systems  Constitutional: Positive for fatigue. Negative for unexpected weight change.  HENT: Negative for trouble swallowing and voice change.   Eyes: Negative for visual disturbance.  Respiratory: Negative for cough, shortness of breath and wheezing.   Cardiovascular: Negative for chest pain, palpitations and leg swelling.  Gastrointestinal: Negative for diarrhea, nausea and vomiting.  Endocrine: Positive for polydipsia and polyuria. Negative for cold intolerance, heat intolerance and polyphagia.  Musculoskeletal: Negative for arthralgias and myalgias.  Skin: Negative for color change, pallor, rash and wound.  Neurological: Negative for seizures and headaches.   Psychiatric/Behavioral: Negative for confusion and suicidal ideas.    Objective:    BP 114/81   Pulse (!) 102   Ht '5\' 2"'  (1.575 m)   Wt 112 lb (50.8 kg)   LMP 10/05/2016   BMI 20.49 kg/m   Wt Readings from Last 3 Encounters:  11/04/16 112 lb (50.8 kg)  10/30/16 110 lb (49.9 kg)  10/21/16 111 lb (50.3 kg)    Physical Exam  Constitutional: She is oriented to person, place, and time. She appears well-developed.  HENT:  Head: Normocephalic and atraumatic.  Eyes: EOM are normal.  Neck: Normal range of motion. Neck supple. No tracheal deviation present. No thyromegaly present.  Cardiovascular: Normal rate and regular rhythm.   Pulmonary/Chest: Effort normal and breath sounds normal.  Abdominal: Soft. Bowel sounds are normal. There is no tenderness. There is no guarding.  Musculoskeletal: Normal range of motion. She exhibits no edema.  Neurological: She is alert and oriented to person, place, and time. She has normal reflexes. No cranial nerve deficit. Coordination normal.  Skin: Skin is warm and dry. No rash noted. No erythema. No pallor.  Psychiatric: She has a  normal mood and affect. Judgment normal.    Complete Blood Count (Most recent): Lab Results  Component Value Date   WBC 8.6 10/22/2016   HGB 11.8 (L) 10/01/2016   HCT 37.9 10/22/2016   MCV 89 10/22/2016   PLT 611 (H) 10/22/2016   Chemistry (most recent): Lab Results  Component Value Date   NA 133 (L) 10/29/2016   K 4.7 10/29/2016   CL 98 10/29/2016   CO2 24 10/29/2016   BUN 19 10/29/2016   CREATININE 0.56 10/29/2016   Diabetic Labs (most recent): Lab Results  Component Value Date   HGBA1C 8.3 (H) 10/29/2016   HGBA1C 9.3 (H) 09/25/2016   HGBA1C 10.4 (H) 09/20/2016    1) uncontrolled pancreatic diabetes 2) pancreatic insufficiency 3) vitamin D deficiency   Assessment & Plan:   1. Uncontrolled type 1 diabetes mellitus with complication ([place])  She  is on [facility] -for continuous glucose monitoring.   -She remains at a high risk for more acute and chronic complications of diabetes which include CAD, CVA, CKD, retinopathy, and neuropathy.  These are all discussed in detail with the patient. -Her  recent A1c is 8.3% , slowly improving from 10.7%.   -  Recent labs reviewed.  - Suggestion is made for patient to avoid simple carbohydrates   from their diet including Cakes , Desserts, Ice Cream,  Soda (  diet and regular) , Sweet Tea , Candies,  Chips, Cookies, Artificial Sweeteners,   and "Sugar-free" Products .  This will help patient to have stable blood glucose profile and potentially avoid unintended  Weight gain.  - Patient is advised to stick to a routine mealtimes to eat 3 meals  a day and avoid unnecessary snacks ( to snack only to correct hypoglycemia).  - I have approached patient with the following individualized plan to manage diabetes and patient agrees.  -  She is currently on continuous tube feeding using Osmolite, she would need increase in her insulin doses.  - I will  increase  Tresiba  To 24 units qhs, and increase continue  Novolog  To 8 units TIDAC for premeal BG readings of 90-12m/dl, plus patient specific sliding scale insulin for correction of unexpected hyperglycemia above 1533mdl, associated with strict monitoring of BG AC and HS.   - I advised her to keep her follow up with GI, she states she is tolerated more and more oral. I urged her to allow more  carbs , specially complex carbs, to prevent unintentional weight loss. -She is a vegetarian, gave her more plant-based protein sources to diversify her protein intake.   -Adjustment parameters for hypo and hyperglycemia were given in a written document to patient. -Patient is encouraged to call clinic for blood glucose levels less than 70 or above 300 mg /dl.C plus SSI.  Her diabetes is due to yet unidentified hereditary pancreatitis. She is administratively classified as type 1 diabetes,  will be treated as a typical  type 1 DM. -She is using DEXCOM. -She is not a candidate for metformin,SGLT2 inhibitors, and incretin therapy. -Recent thyroid function test where normal , her prior AM cortisol has been normal at 18. - Patient specific target  for A1c; LDL, HDL, Triglycerides, and  Waist Circumference were discussed in detail. -Her ophthalm eval was negative, and due for another one.  2) Pancreatic Insufficiency: -she is advised to  increase Creon to 1 capsules of 24,000 units per meal and 1 tab per snack.  3) vitamin D deficiency: She is  status post therapy with vitamin D 50,000 units weekly.  - Her vitamin D is 27. I advised her to start vitamin D3 5000 units daily for the next 90 days.   4) Chronic Care/Health Maintenance:  -Patient  Is encouraged to continue to follow up with Ophthalmology, Podiatrist at least yearly or according to recommendations, and advised to stay away from smoking. I have recommended yearly flu vaccine and pneumonia vaccination at least every 5 years; moderate intensity exercise for up to 150 minutes weekly; and  sleep for at least 7 hours a day. - I will obtain vitamin B12 and vitamin D before her next visit.  - 25 minutes of time was spent on the care of this patient , 50% of which was applied for counseling on diabetes complications and their preventions. - Her labs show vitamin  B12 above target at 1300, I advised her to avoid vitamin B12 supplement for now.   - I advised patient to maintain close follow up with Dr. [name]  for primary care needs. - She reports that she is getting married and possibly moving to another [place]. I advised her to identify an endocrinologist in her new area as soon as she relocates and we will forward her records accordingly.  Patient is asked to bring meter and  blood glucose logs during their next visit.   Follow up plan: -Return in about 4 weeks (around 12/02/2016) for follow up with meter and logs- no labs.  Glade Lloyd, MD Phone: [phone]   Fax: [phone]   11/04/2016, 4:07 PM

## 2016-11-05 ENCOUNTER — Other Ambulatory Visit: Payer: Self-pay | Admitting: "Endocrinology

## 2016-11-05 ENCOUNTER — Ambulatory Visit (INDEPENDENT_AMBULATORY_CARE_PROVIDER_SITE_OTHER): Payer: BC Managed Care – PPO | Admitting: Internal Medicine

## 2016-11-05 ENCOUNTER — Encounter (INDEPENDENT_AMBULATORY_CARE_PROVIDER_SITE_OTHER): Payer: Self-pay | Admitting: Internal Medicine

## 2016-11-05 VITALS — BP 110/70 | Temp 98.6°F | Ht 62.0 in | Wt 111.9 lb

## 2016-11-05 DIAGNOSIS — K859 Acute pancreatitis without necrosis or infection, unspecified: Secondary | ICD-10-CM | POA: Diagnosis not present

## 2016-11-05 NOTE — Patient Instructions (Addendum)
Follow up with [facility] concerning tube feeding.

## 2016-11-05 NOTE — Progress Notes (Signed)
Subjective:    Patient ID: Linda Walls, female    DOB: September 03, 1990, 26 y.o.   MRN: [id number]  HPI Here today for f/u after recent visit to [facility] with a clooged G-Tube. G tube placed a little of 4 weeks ago by Dr. [name]. Hx of hereditary pancreatitis and is followed at [facility]. She tells me her G tube is patient. She is on continuous feeding.  Feelings are osmolite. (45 cc hr).  She does take in oral food.  She is on a low fat diet. She has gaineed from 105.6 to 111.9 pounds since 10/15/2016. She has a BM daily. No melena or BRRB. She denies any abdominal pain.   CMP Latest Ref Rng & Units 10/29/2016 10/22/2016 10/02/2016  Glucose 65 - 99 mg/dL 174(H) 200(H) 233(H)  BUN 7 - 25 mg/dL 19 11 5(L)  Creatinine 0.50 - 1.10 mg/dL 0.56 0.46(L) 0.39(L)  Sodium 135 - 146 mmol/L 133(L) 136 135  Potassium 3.5 - 5.3 mmol/L 4.7 5.4(H) 3.9  Chloride 98 - 110 mmol/L 98 97 100(L)  CO2 20 - 31 mmol/L 24 24 27   Calcium 8.6 - 10.2 mg/dL 9.8 9.9 9.2  Total Protein 6.1 - 8.1 g/dL 7.7 7.2 -  Total Bilirubin 0.2 - 1.2 mg/dL 0.3 0.2 -  Alkaline Phos 33 - 115 U/L 85 78 -  AST 10 - 30 U/L 15 14 -  ALT 6 - 29 U/L 11 9 -   CBC    Component Value Date/Time   WBC 8.6 10/22/2016 0838   WBC 7.9 10/01/2016 0614   RBC 4.25 10/22/2016 0838   RBC 4.04 10/01/2016 0614   HGB 11.8 (L) 10/01/2016 0614   HCT 37.9 10/22/2016 0838   PLT 611 (H) 10/22/2016 0838   MCV 89 10/22/2016 0838   MCH 28.5 10/22/2016 0838   MCH 29.2 10/01/2016 0614   MCHC 31.9 10/22/2016 0838   MCHC 32.9 10/01/2016 0614   RDW 14.8 10/22/2016 0838   LYMPHSABS 2.3 10/22/2016 0838   MONOABS 0.6 09/25/2016 0555   EOSABS 0.4 10/22/2016 0838   BASOSABS 0.0 10/22/2016 0838   Hepatic Function Panel     Component Value Date/Time   PROT 7.7 10/29/2016 1030   PROT 7.2 10/22/2016 0838   ALBUMIN 4.3 10/29/2016 1030   ALBUMIN 4.4 10/22/2016 0838   AST 15 10/29/2016 1030   ALT 11 10/29/2016 1030   ALKPHOS 85 10/29/2016 1030   BILITOT 0.3 10/29/2016  1030   BILITOT 0.2 10/22/2016 0838   BILIDIR 0.08 10/22/2016 0838   IBILI NOT CALCULATED 05/06/2015 0437     Review of Systems Past Medical History:  Diagnosis Date  . DKA (diabetic ketoacidoses) ([place])   . DM type 1 (diabetes mellitus, type 1) ([place])   . GERD (gastroesophageal reflux disease)   . Hereditary pancreatitis   . Hernia   . IBS (irritable bowel syndrome)   . Jaundice   . Migraines     Past Surgical History:  Procedure Laterality Date  . ABDOMINAL SURGERY  2018   feeding tube  . BILE DUCT STENT PLACEMENT  2006 at [place] 6 months after it was placed.  . ESOPHAGOGASTRODUODENOSCOPY (EGD) WITH PROPOFOL N/A 10/01/2016   Procedure: ESOPHAGOGASTRODUODENOSCOPY (EGD) WITH PROPOFOL With feeding tube placement.;  Surgeon: Rogene Houston, MD;  [facility];  Service: Endoscopy;  Laterality: N/A;  . EUS/ERCP  08/28/2016   At [facility] with placement of pancreatic duct stent  . HERNIA REPAIR  As infant.  Marland Kitchen PANCREATIC CYST DRAINAGE  at age 26 year    Allergies  Allergen Reactions  . Shellfish Allergy Nausea And Vomiting  . Cefzil [Cefprozil] Rash  . Penicillins Rash    Has patient had a PCN reaction causing immediate rash, facial/tongue/throat swelling, SOB or lightheadedness with hypotension: NO Has patient had a PCN reaction causing severe rash involving mucus membranes or skin necrosis: NO Has patient had a PCN reaction that required hospitalization: NO Has patient had a PCN reaction occurring within the last 10 years: NO If all of the above answers are "NO", then may proceed with Cephalosporin use.   . Sulfa Antibiotics Rash    Current Outpatient Prescriptions on File Prior to Visit  Medication Sig Dispense Refill  . Cholecalciferol (VITAMIN D-1000 MAX ST) 1000 units tablet Take 1,000 Units by mouth daily.     Marland Kitchen CREON 24000-76000 units CPEP Take two capsules with meals and one with snacks.    . ferrous sulfate (FERROUSUL) 325 (65 FE) MG tablet  Take 325 mg by mouth daily with breakfast.     . hyoscyamine (LEVSIN, ANASPAZ) 0.125 MG tablet Take 0.125 mg by mouth every 4 (four) hours as needed for bladder spasms or cramping.     . insulin aspart (NOVOLOG FLEXPEN) 100 UNIT/ML FlexPen Inject 8-14 Units into the skin 3 (three) times daily with meals. (Patient taking differently: Inject 8-14 Units into the skin every 4 (four) hours. ) 15 mL 2  . insulin degludec (TRESIBA FLEXTOUCH) 100 UNIT/ML SOPN FlexTouch Pen Inject 24 Units into the skin daily at 10 pm.    . omeprazole (PRILOSEC) 20 MG capsule Take 20 mg by mouth daily.     Marland Kitchen oxyCODONE-acetaminophen (PERCOCET) 10-325 MG tablet Take one half up to QID 60 tablet 0  . SUMAtriptan (IMITREX) 50 MG tablet Take 50 mg by mouth every 2 (two) hours as needed for migraine.     . vitamin C (ASCORBIC ACID) 500 MG tablet Take 500 mg by mouth.     No current facility-administered medications on file prior to visit.        Objective:   Physical Exam Blood pressure 110/70, temperature 98.6 F (37 C), height 5\' 2"  (1.575 m), weight 111 lb 14.4 oz (50.8 kg). Alert and oriented. Skin warm and dry. Oral mucosa is moist.   . Sclera anicteric, conjunctivae is pink. Thyroid not enlarged. No cervical lymphadenopathy. Lungs clear. Heart regular rate and rhythm.  Abdomen is soft. Bowel sounds are positive. No hepatomegaly. No abdominal masses felt. No tenderness.  No edema to lower extremities.  Peg tube in place.       Assessment & Plan:  Hereditary Pancreatitis. I discussed with Dr. [name] concerning feedings. She will call GI at [facility] concerning feedings.

## 2016-12-02 ENCOUNTER — Ambulatory Visit: Payer: BC Managed Care – PPO | Admitting: "Endocrinology

## 2016-12-13 ENCOUNTER — Other Ambulatory Visit: Payer: Self-pay | Admitting: Family Medicine

## 2016-12-15 NOTE — Telephone Encounter (Signed)
Last seen 09/2016

## 2017-01-21 ENCOUNTER — Ambulatory Visit: Payer: BC Managed Care – PPO | Admitting: Family Medicine

## 2017-06-01 IMAGING — RF DG FLUORO RM 1-60 MIN
2 series · 2 of 2 positions shown · IV contrast (agent unspecified)
Comparison: 09/29/2016

ADDENDUM:
After the patient returned to her room, she experienced an episode
of vomiting and dislodged the feeding tube, which remained take in
her nose at 79 cm though the bulk of the tube was now coiled in her
throat.

Patient return to fluoroscopy room and the catheter was repositioned
with the tip again placed at the pylorus.
Unable to pass the catheter into the duodenum.
Tube was again taped at 79 cm.
Patient instructed to lying on RIGHT side and will perform abdominal
radiographs in 4 hours to assess tube position.
Additional fluoroscopy time 2 minutes 42 seconds.
Dose:  55.8  mGy
CLINICAL DATA: Dislodged feeding tube
EXAM:
FLOURO RM 1-60 MIN
CONTRAST:  None
FLUOROSCOPY TIME:  Fluoroscopy Time:  6 minutes 12 seconds
Radiation Exposure Index (if provided by the fluoroscopic device):
35.4 mGy
Number of Acquired Spot Images: 1 fluoroscopic screen capture

[Series 2: cp_standard · 0.17mm/px · 1 of 1 slices shown (1 of 2)]
[im 1/1]
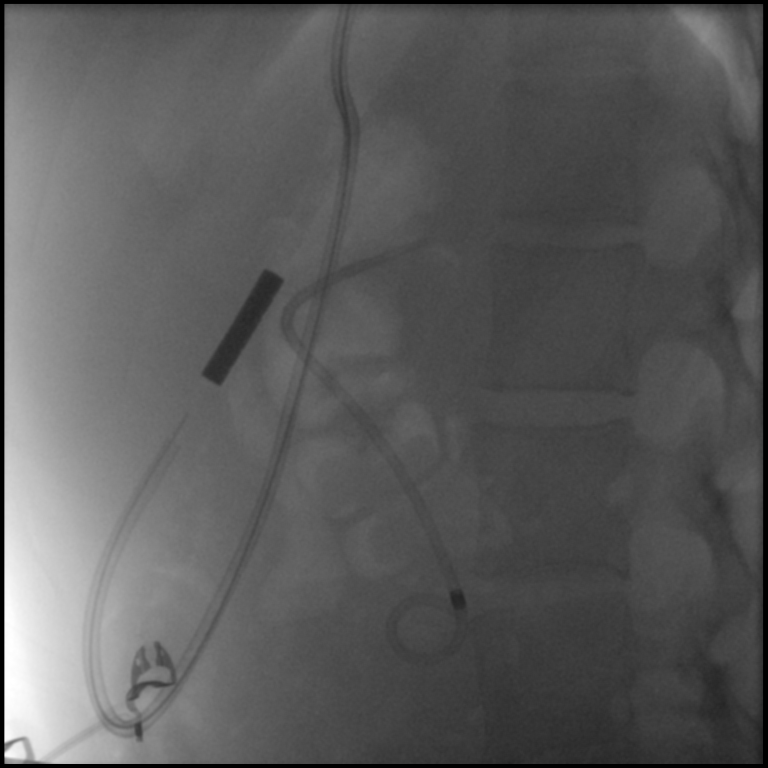

[Series 3: cp_standard · 0.17mm/px · 1 of 1 slices shown (2 of 2)]
[im 1/1]
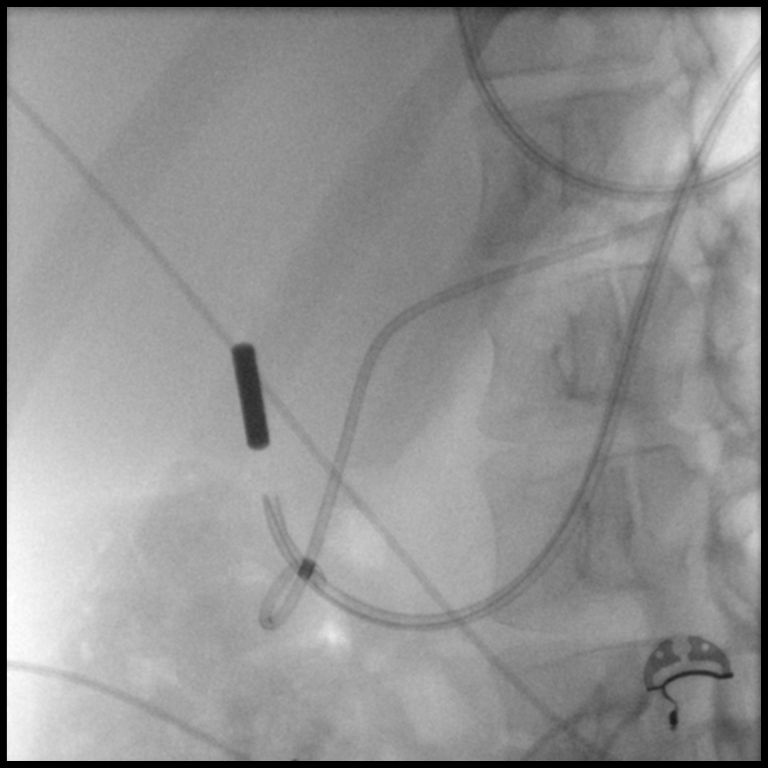

[2 of 2 positions shown; findings below may reference images not displayed]

FINDINGS: Initial fluoroscopy demonstrated that the tip of the feeding tube
placed on 09/29/2016 has been dislodged and has been retracted all
the way to the nasopharynx.

Using an .035 guidewire, the feeding tube was advanced into the
stomach.

The tip of the feeding tube was placed at the pylorus.

Despite multiple manipulations with the guidewire and RIGHT lateral
decubitus positioning, the tip of the tube could not be placed
across the pylorus into the duodenum.

Following guidewire removal, the tube was left in place at this site
and the patient was instructed to lie on her RIGHT side for the next
4 hours in an attempt to get it to pass across the pylorus.

Abdominal radiograph ordered for 7899 hours today.

Tube was secured with the 79 cm mark at the tip of the nose.
IMPRESSION: Placement of feeding tube with tip at the pylorus as above.

## 2017-06-04 IMAGING — RF DG FLUORO RM 1-60 MIN
1 series · 1 of 1 positions shown · IV contrast (agent unspecified)
Comparison: 09/30/2016

CLINICAL DATA: Feeding tube placement

EXAM:
FLOURO RM 1-60 MIN
CONTRAST:  None
FLUOROSCOPY TIME:  Fluoroscopy Time:  7 minutes 6 seconds
Radiation Exposure Index (if provided by the fluoroscopic device):
30.6 mGy
Number of Acquired Spot Images: Single fluoroscopic screen capture

[Series 5: cp_standard · 0.26mm/px · 1 of 1 slices shown]
[im 1/1]
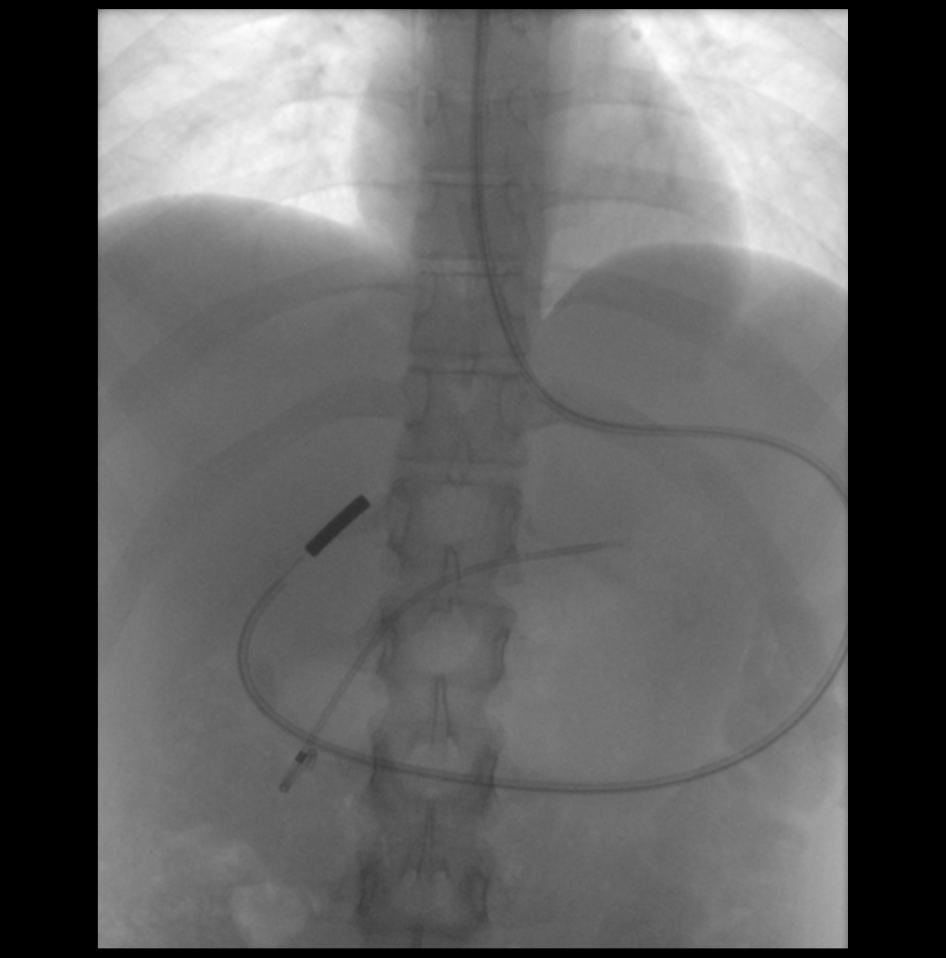

[1 of 1 positions shown; findings below may reference images not displayed]

FINDINGS: Pharynx was anesthetized with Cetacaine spray.

Unable to pass finger roof feeding tube through LEFT nasal passages.

Feeding tube was placed to the RIGHT nasal passages without
difficulty through the esophagus into the stomach.

Tip of the tube was advanced to the pylorus.

Despite multiple manipulations in changes in position, unable to get
tip of the tube to pass across pylorus into duodenum.

The tube was LEFT in place at the pylorus.
IMPRESSION: Feeding tube was placed with the tip at the pylorus.

Recommend followup abdominal radiograph on 10/04/2016 to determine
if it has passed into the duodenum.

## 2017-07-09 DIAGNOSIS — Z029 Encounter for administrative examinations, unspecified: Secondary | ICD-10-CM

## 2019-07-27 ENCOUNTER — Encounter: Payer: Self-pay | Admitting: Family Medicine

## 2019-07-28 ENCOUNTER — Encounter: Payer: Self-pay | Admitting: Family Medicine
